# Patient Record
Sex: Female | Born: 1950 | Race: White | Hispanic: No | Marital: Married | Smoking: Current every day smoker
Health system: Southern US, Community
[De-identification: ages and names within clinical notes are randomized; demographics above are authoritative.]

## PROBLEM LIST (undated history)

## (undated) DIAGNOSIS — J45909 Unspecified asthma, uncomplicated: Secondary | ICD-10-CM

## (undated) DIAGNOSIS — K580 Irritable bowel syndrome with diarrhea: Secondary | ICD-10-CM

## (undated) DIAGNOSIS — M51369 Other intervertebral disc degeneration, lumbar region without mention of lumbar back pain or lower extremity pain: Secondary | ICD-10-CM

## (undated) DIAGNOSIS — K589 Irritable bowel syndrome without diarrhea: Secondary | ICD-10-CM

## (undated) DIAGNOSIS — M1990 Unspecified osteoarthritis, unspecified site: Secondary | ICD-10-CM

## (undated) DIAGNOSIS — Z85828 Personal history of other malignant neoplasm of skin: Secondary | ICD-10-CM

## (undated) DIAGNOSIS — F419 Anxiety disorder, unspecified: Secondary | ICD-10-CM

## (undated) DIAGNOSIS — Z720 Tobacco use: Secondary | ICD-10-CM

## (undated) DIAGNOSIS — N393 Stress incontinence (female) (male): Secondary | ICD-10-CM

## (undated) DIAGNOSIS — G629 Polyneuropathy, unspecified: Secondary | ICD-10-CM

## (undated) DIAGNOSIS — E782 Mixed hyperlipidemia: Secondary | ICD-10-CM

## (undated) DIAGNOSIS — G43019 Migraine without aura, intractable, without status migrainosus: Secondary | ICD-10-CM

## (undated) DIAGNOSIS — G9050 Complex regional pain syndrome I, unspecified: Secondary | ICD-10-CM

## (undated) DIAGNOSIS — M47812 Spondylosis without myelopathy or radiculopathy, cervical region: Secondary | ICD-10-CM

## (undated) DIAGNOSIS — R112 Nausea with vomiting, unspecified: Secondary | ICD-10-CM

## (undated) DIAGNOSIS — R42 Dizziness and giddiness: Secondary | ICD-10-CM

## (undated) DIAGNOSIS — N329 Bladder disorder, unspecified: Secondary | ICD-10-CM

## (undated) DIAGNOSIS — Z9889 Other specified postprocedural states: Secondary | ICD-10-CM

## (undated) DIAGNOSIS — E785 Hyperlipidemia, unspecified: Secondary | ICD-10-CM

## (undated) DIAGNOSIS — Z973 Presence of spectacles and contact lenses: Secondary | ICD-10-CM

## (undated) DIAGNOSIS — I10 Essential (primary) hypertension: Secondary | ICD-10-CM

## (undated) DIAGNOSIS — I700 Atherosclerosis of aorta: Secondary | ICD-10-CM

## (undated) DIAGNOSIS — M5136 Other intervertebral disc degeneration, lumbar region: Secondary | ICD-10-CM

## (undated) DIAGNOSIS — K635 Polyp of colon: Secondary | ICD-10-CM

## (undated) DIAGNOSIS — K219 Gastro-esophageal reflux disease without esophagitis: Secondary | ICD-10-CM

## (undated) DIAGNOSIS — R7303 Prediabetes: Secondary | ICD-10-CM

## (undated) DIAGNOSIS — G43909 Migraine, unspecified, not intractable, without status migrainosus: Secondary | ICD-10-CM

## (undated) DIAGNOSIS — F411 Generalized anxiety disorder: Secondary | ICD-10-CM

## (undated) DIAGNOSIS — Z8709 Personal history of other diseases of the respiratory system: Secondary | ICD-10-CM

## (undated) DIAGNOSIS — E041 Nontoxic single thyroid nodule: Secondary | ICD-10-CM

## (undated) DIAGNOSIS — I2510 Atherosclerotic heart disease of native coronary artery without angina pectoris: Secondary | ICD-10-CM

## (undated) HISTORY — PX: BACK SURGERY: SHX140

## (undated) HISTORY — DX: Atherosclerosis of aorta: I70.0

## (undated) HISTORY — DX: Unspecified asthma, uncomplicated: J45.909

## (undated) HISTORY — PX: COLONOSCOPY W/ POLYPECTOMY: SHX1380

## (undated) HISTORY — PX: CATARACT EXTRACTION: SUR2

## (undated) HISTORY — DX: Dizziness and giddiness: R42

## (undated) HISTORY — PX: POSTERIOR LUMBAR FUSION: SHX6036

## (undated) HISTORY — PX: TONSILLECTOMY AND ADENOIDECTOMY: SUR1326

## (undated) HISTORY — DX: Migraine without aura, intractable, without status migrainosus: G43.019

## (undated) HISTORY — DX: Polyp of colon: K63.5

## (undated) HISTORY — DX: Stress incontinence (female) (male): N39.3

## (undated) HISTORY — DX: Complex regional pain syndrome I, unspecified: G90.50

## (undated) HISTORY — DX: Anxiety disorder, unspecified: F41.9

---

## 1986-12-20 HISTORY — PX: SUPRACERVICAL ABDOMINAL HYSTERECTOMY: SHX5393

## 1986-12-20 HISTORY — PX: PARTIAL HYSTERECTOMY: SHX80

## 2000-05-31 ENCOUNTER — Encounter: Payer: Self-pay | Admitting: Family Medicine

## 2000-05-31 ENCOUNTER — Ambulatory Visit (HOSPITAL_COMMUNITY): Admission: RE | Admit: 2000-05-31 | Discharge: 2000-05-31 | Payer: Self-pay | Admitting: Family Medicine

## 2001-01-23 ENCOUNTER — Encounter (INDEPENDENT_AMBULATORY_CARE_PROVIDER_SITE_OTHER): Payer: Self-pay

## 2001-01-23 ENCOUNTER — Other Ambulatory Visit: Admission: RE | Admit: 2001-01-23 | Discharge: 2001-01-23 | Payer: Self-pay | Admitting: Gastroenterology

## 2001-06-02 ENCOUNTER — Encounter: Payer: Self-pay | Admitting: Family Medicine

## 2001-06-02 ENCOUNTER — Ambulatory Visit (HOSPITAL_COMMUNITY): Admission: RE | Admit: 2001-06-02 | Discharge: 2001-06-02 | Payer: Self-pay | Admitting: Family Medicine

## 2001-06-27 ENCOUNTER — Encounter: Admission: RE | Admit: 2001-06-27 | Discharge: 2001-07-04 | Payer: Self-pay | Admitting: Internal Medicine

## 2002-05-09 ENCOUNTER — Encounter: Payer: Self-pay | Admitting: Family Medicine

## 2002-05-09 ENCOUNTER — Ambulatory Visit (HOSPITAL_COMMUNITY): Admission: RE | Admit: 2002-05-09 | Discharge: 2002-05-09 | Payer: Self-pay | Admitting: Family Medicine

## 2002-05-15 ENCOUNTER — Encounter: Admission: RE | Admit: 2002-05-15 | Discharge: 2002-05-15 | Payer: Self-pay | Admitting: Family Medicine

## 2002-05-15 ENCOUNTER — Encounter: Payer: Self-pay | Admitting: Family Medicine

## 2003-07-30 ENCOUNTER — Encounter: Payer: Self-pay | Admitting: Family Medicine

## 2003-07-30 ENCOUNTER — Ambulatory Visit (HOSPITAL_COMMUNITY): Admission: RE | Admit: 2003-07-30 | Discharge: 2003-07-30 | Payer: Self-pay | Admitting: Family Medicine

## 2003-12-21 HISTORY — PX: CATARACT EXTRACTION W/ INTRAOCULAR LENS  IMPLANT, BILATERAL: SHX1307

## 2004-09-22 ENCOUNTER — Ambulatory Visit (HOSPITAL_COMMUNITY): Admission: RE | Admit: 2004-09-22 | Discharge: 2004-09-22 | Payer: Self-pay | Admitting: Family Medicine

## 2005-11-16 ENCOUNTER — Ambulatory Visit (HOSPITAL_COMMUNITY): Admission: RE | Admit: 2005-11-16 | Discharge: 2005-11-16 | Payer: Self-pay | Admitting: Family Medicine

## 2006-12-02 ENCOUNTER — Ambulatory Visit (HOSPITAL_COMMUNITY): Admission: RE | Admit: 2006-12-02 | Discharge: 2006-12-02 | Payer: Self-pay | Admitting: Family Medicine

## 2007-05-25 ENCOUNTER — Ambulatory Visit (HOSPITAL_COMMUNITY): Admission: RE | Admit: 2007-05-25 | Discharge: 2007-05-25 | Payer: Self-pay | Admitting: Family Medicine

## 2007-12-04 ENCOUNTER — Ambulatory Visit (HOSPITAL_COMMUNITY): Admission: RE | Admit: 2007-12-04 | Discharge: 2007-12-04 | Payer: Self-pay | Admitting: Family Medicine

## 2008-12-19 ENCOUNTER — Ambulatory Visit (HOSPITAL_COMMUNITY): Admission: RE | Admit: 2008-12-19 | Discharge: 2008-12-19 | Payer: Self-pay | Admitting: Family Medicine

## 2009-12-20 HISTORY — PX: FOOT SURGERY: SHX648

## 2010-01-15 ENCOUNTER — Ambulatory Visit (HOSPITAL_COMMUNITY): Admission: RE | Admit: 2010-01-15 | Discharge: 2010-01-15 | Payer: Self-pay | Admitting: Family Medicine

## 2011-01-10 ENCOUNTER — Encounter: Payer: Self-pay | Admitting: Family Medicine

## 2011-03-02 ENCOUNTER — Other Ambulatory Visit (HOSPITAL_COMMUNITY): Payer: Self-pay | Admitting: Family Medicine

## 2011-03-02 DIAGNOSIS — Z1231 Encounter for screening mammogram for malignant neoplasm of breast: Secondary | ICD-10-CM

## 2011-03-18 ENCOUNTER — Ambulatory Visit (HOSPITAL_COMMUNITY): Payer: 59

## 2011-03-26 ENCOUNTER — Ambulatory Visit (HOSPITAL_COMMUNITY)
Admission: RE | Admit: 2011-03-26 | Discharge: 2011-03-26 | Disposition: A | Discharge status: Home / Self Care | Payer: 59 | Source: Ambulatory Visit | Attending: Family Medicine | Admitting: Family Medicine

## 2011-03-26 DIAGNOSIS — Z1231 Encounter for screening mammogram for malignant neoplasm of breast: Secondary | ICD-10-CM | POA: Insufficient documentation

## 2012-02-03 ENCOUNTER — Ambulatory Visit (INDEPENDENT_AMBULATORY_CARE_PROVIDER_SITE_OTHER): Payer: 59 | Admitting: Physician Assistant

## 2012-02-03 VITALS — BP 142/79 | HR 78 | Temp 97.7°F | Resp 16 | Ht 59.0 in | Wt 130.0 lb

## 2012-02-03 DIAGNOSIS — H6590 Unspecified nonsuppurative otitis media, unspecified ear: Secondary | ICD-10-CM

## 2012-02-03 DIAGNOSIS — H6980 Other specified disorders of Eustachian tube, unspecified ear: Secondary | ICD-10-CM

## 2012-02-03 DIAGNOSIS — H6593 Unspecified nonsuppurative otitis media, bilateral: Secondary | ICD-10-CM

## 2012-02-03 DIAGNOSIS — H6990 Unspecified Eustachian tube disorder, unspecified ear: Secondary | ICD-10-CM

## 2012-02-03 DIAGNOSIS — J019 Acute sinusitis, unspecified: Secondary | ICD-10-CM

## 2012-02-03 MED ORDER — CLARITHROMYCIN ER 500 MG PO TB24: 1000.0000 mg | ORAL_TABLET | Freq: Every day | ORAL | Status: AC

## 2012-02-03 MED ORDER — IPRATROPIUM BROMIDE 0.03 % NA SOLN: 2.0000 | Freq: Two times a day (BID) | NASAL | Status: DC

## 2012-02-03 MED ORDER — GUAIFENESIN ER 1200 MG PO TB12: 1.0000 | ORAL_TABLET | Freq: Two times a day (BID) | ORAL | Status: DC | PRN

## 2012-02-03 NOTE — Patient Instructions (Signed)
Get as much rest as possible and drink plenty of fluids.

## 2012-02-03 NOTE — Progress Notes (Signed)
  Subjective:    Patient ID: Nichole Delgado, female    DOB: 02-09-1951, 61 y.o.   MRN: [id number]  HPI Patient presents with 2 weeks of sinus pressure and congestion and ear pressure.  The ear symptoms are worsening and are her primary concern today.  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  No sore throat.  Mild cough.  No GI/GU symptoms. No F/C.  No myalgias. Review of Systems As above.    Objective:   Physical Exam  Vitals reviewed. Constitutional: She is oriented to person, place, and time. She appears well-developed and well-nourished. No distress.  HENT:  Head: Normocephalic and atraumatic.  Right Ear: Hearing, external ear and ear canal normal. A middle ear effusion is present.  Left Ear: Hearing, external ear and ear canal normal. A middle ear effusion is present.  Ears:  Nose: Mucosal edema and rhinorrhea present.  No foreign bodies. Right sinus exhibits no maxillary sinus tenderness and no frontal sinus tenderness. Left sinus exhibits no maxillary sinus tenderness and no frontal sinus tenderness.  Mouth/Throat: Uvula is midline, oropharynx is clear and moist and mucous membranes are normal. No uvula swelling. No oropharyngeal exudate.  Eyes: Conjunctivae and EOM are normal. Pupils are equal, round, and reactive to light. Right eye exhibits no discharge. Left eye exhibits no discharge. No scleral icterus.  Neck: Trachea normal, normal range of motion and full passive range of motion without pain. Neck supple. No mass and no thyromegaly present.  Cardiovascular: Normal rate, regular rhythm and normal heart sounds.   Pulmonary/Chest: Effort normal and breath sounds normal.  Lymphadenopathy:       Head (right side): No submandibular, no tonsillar, no preauricular, no posterior auricular and no occipital adenopathy present.       Head (left side): No  submandibular, no tonsillar, no preauricular and no occipital adenopathy present.    She has no cervical adenopathy.       Right: No supraclavicular adenopathy present.       Left: No supraclavicular adenopathy present.  Neurological: She is alert and oriented to person, place, and time. She has normal strength. No cranial nerve deficit or sensory deficit.  Skin: Skin is warm, dry and intact. No rash noted.  Psychiatric: She has a normal mood and affect. Her speech is normal and behavior is normal.          Assessment & Plan:  Eustachian tube dysfunction. Bilateral otitis media with effusion. Sinusitis.  Biaxin XL 500 mg 2 PO QD x 10. Atrovent NS 0.03% 2 prays Qnos BID Mucinex Max Strength BID

## 2012-03-29 ENCOUNTER — Other Ambulatory Visit (HOSPITAL_COMMUNITY): Payer: Self-pay | Admitting: Family Medicine

## 2012-03-29 DIAGNOSIS — Z1231 Encounter for screening mammogram for malignant neoplasm of breast: Secondary | ICD-10-CM

## 2012-04-25 ENCOUNTER — Ambulatory Visit (HOSPITAL_COMMUNITY)
Admission: RE | Admit: 2012-04-25 | Discharge: 2012-04-25 | Disposition: A | Discharge status: Home / Self Care | Payer: 59 | Source: Ambulatory Visit | Attending: Family Medicine | Admitting: Family Medicine

## 2012-04-25 DIAGNOSIS — Z1231 Encounter for screening mammogram for malignant neoplasm of breast: Secondary | ICD-10-CM | POA: Insufficient documentation

## 2013-03-28 ENCOUNTER — Other Ambulatory Visit (HOSPITAL_COMMUNITY): Payer: Self-pay | Admitting: Family Medicine

## 2013-03-28 DIAGNOSIS — Z1231 Encounter for screening mammogram for malignant neoplasm of breast: Secondary | ICD-10-CM

## 2013-04-26 ENCOUNTER — Ambulatory Visit (HOSPITAL_COMMUNITY)
Admission: RE | Admit: 2013-04-26 | Discharge: 2013-04-26 | Disposition: A | Discharge status: Home / Self Care | Payer: BC Managed Care – PPO | Source: Ambulatory Visit | Attending: Family Medicine | Admitting: Family Medicine

## 2013-04-26 DIAGNOSIS — Z1231 Encounter for screening mammogram for malignant neoplasm of breast: Secondary | ICD-10-CM | POA: Insufficient documentation

## 2014-06-24 ENCOUNTER — Other Ambulatory Visit (HOSPITAL_COMMUNITY): Payer: Self-pay | Admitting: Family Medicine

## 2014-06-24 DIAGNOSIS — Z1231 Encounter for screening mammogram for malignant neoplasm of breast: Secondary | ICD-10-CM

## 2014-07-10 ENCOUNTER — Ambulatory Visit (HOSPITAL_COMMUNITY)
Admission: RE | Admit: 2014-07-10 | Discharge: 2014-07-10 | Disposition: A | Discharge status: Home / Self Care | Payer: Medicare Other | Source: Ambulatory Visit | Attending: Family Medicine | Admitting: Family Medicine

## 2014-07-10 DIAGNOSIS — Z1231 Encounter for screening mammogram for malignant neoplasm of breast: Secondary | ICD-10-CM | POA: Diagnosis not present

## 2014-09-26 ENCOUNTER — Other Ambulatory Visit (HOSPITAL_COMMUNITY): Payer: Self-pay | Admitting: Family Medicine

## 2014-09-26 DIAGNOSIS — M810 Age-related osteoporosis without current pathological fracture: Secondary | ICD-10-CM

## 2014-10-02 ENCOUNTER — Ambulatory Visit (HOSPITAL_COMMUNITY): Payer: BC Managed Care – PPO

## 2014-10-03 ENCOUNTER — Ambulatory Visit (HOSPITAL_COMMUNITY)
Admission: RE | Admit: 2014-10-03 | Discharge: 2014-10-03 | Disposition: A | Discharge status: Home / Self Care | Payer: Medicare Other | Source: Ambulatory Visit | Attending: Family Medicine | Admitting: Family Medicine

## 2014-10-03 DIAGNOSIS — Z780 Asymptomatic menopausal state: Secondary | ICD-10-CM | POA: Diagnosis not present

## 2014-10-03 DIAGNOSIS — Z13820 Encounter for screening for osteoporosis: Secondary | ICD-10-CM | POA: Insufficient documentation

## 2014-10-03 DIAGNOSIS — M810 Age-related osteoporosis without current pathological fracture: Secondary | ICD-10-CM

## 2014-12-23 DIAGNOSIS — L820 Inflamed seborrheic keratosis: Secondary | ICD-10-CM | POA: Diagnosis not present

## 2014-12-24 ENCOUNTER — Other Ambulatory Visit (HOSPITAL_COMMUNITY): Payer: Self-pay | Admitting: Psychiatry

## 2015-02-06 DIAGNOSIS — R05 Cough: Secondary | ICD-10-CM | POA: Diagnosis not present

## 2015-02-06 DIAGNOSIS — F17200 Nicotine dependence, unspecified, uncomplicated: Secondary | ICD-10-CM | POA: Diagnosis not present

## 2015-02-18 ENCOUNTER — Institutional Professional Consult (permissible substitution): Payer: Medicare Other | Admitting: Internal Medicine

## 2015-03-06 NOTE — Telephone Encounter (Signed)
Refill of Navane declined as patient is not open to this facility.  Informed pharmacy through e-scribe message.

## 2015-03-12 DIAGNOSIS — M25562 Pain in left knee: Secondary | ICD-10-CM | POA: Diagnosis not present

## 2015-03-12 DIAGNOSIS — M1712 Unilateral primary osteoarthritis, left knee: Secondary | ICD-10-CM | POA: Diagnosis not present

## 2015-03-12 DIAGNOSIS — M25561 Pain in right knee: Secondary | ICD-10-CM | POA: Diagnosis not present

## 2015-03-31 DIAGNOSIS — H04123 Dry eye syndrome of bilateral lacrimal glands: Secondary | ICD-10-CM | POA: Diagnosis not present

## 2015-03-31 DIAGNOSIS — H16103 Unspecified superficial keratitis, bilateral: Secondary | ICD-10-CM | POA: Diagnosis not present

## 2015-03-31 DIAGNOSIS — Z961 Presence of intraocular lens: Secondary | ICD-10-CM | POA: Diagnosis not present

## 2015-03-31 DIAGNOSIS — H52203 Unspecified astigmatism, bilateral: Secondary | ICD-10-CM | POA: Diagnosis not present

## 2015-04-15 DIAGNOSIS — R110 Nausea: Secondary | ICD-10-CM | POA: Diagnosis not present

## 2015-04-15 DIAGNOSIS — K589 Irritable bowel syndrome without diarrhea: Secondary | ICD-10-CM | POA: Diagnosis not present

## 2015-06-06 DIAGNOSIS — R109 Unspecified abdominal pain: Secondary | ICD-10-CM | POA: Diagnosis not present

## 2015-06-06 DIAGNOSIS — R110 Nausea: Secondary | ICD-10-CM | POA: Diagnosis not present

## 2015-06-06 DIAGNOSIS — K589 Irritable bowel syndrome without diarrhea: Secondary | ICD-10-CM | POA: Diagnosis not present

## 2015-06-12 DIAGNOSIS — F250 Schizoaffective disorder, bipolar type: Secondary | ICD-10-CM | POA: Diagnosis not present

## 2015-06-17 DIAGNOSIS — F17200 Nicotine dependence, unspecified, uncomplicated: Secondary | ICD-10-CM | POA: Diagnosis not present

## 2015-06-17 DIAGNOSIS — R0789 Other chest pain: Secondary | ICD-10-CM | POA: Diagnosis not present

## 2015-06-17 DIAGNOSIS — J984 Other disorders of lung: Secondary | ICD-10-CM | POA: Diagnosis not present

## 2015-06-30 ENCOUNTER — Other Ambulatory Visit (HOSPITAL_COMMUNITY): Payer: Self-pay | Admitting: Family Medicine

## 2015-06-30 DIAGNOSIS — Z1231 Encounter for screening mammogram for malignant neoplasm of breast: Secondary | ICD-10-CM

## 2015-07-07 DIAGNOSIS — M19041 Primary osteoarthritis, right hand: Secondary | ICD-10-CM | POA: Diagnosis not present

## 2015-07-21 ENCOUNTER — Ambulatory Visit (HOSPITAL_COMMUNITY)
Admission: RE | Admit: 2015-07-21 | Discharge: 2015-07-21 | Disposition: A | Discharge status: Home / Self Care | Payer: Medicare Other | Source: Ambulatory Visit | Attending: Family Medicine | Admitting: Family Medicine

## 2015-07-21 DIAGNOSIS — Z1231 Encounter for screening mammogram for malignant neoplasm of breast: Secondary | ICD-10-CM | POA: Diagnosis not present

## 2015-09-11 DIAGNOSIS — M1712 Unilateral primary osteoarthritis, left knee: Secondary | ICD-10-CM | POA: Diagnosis not present

## 2015-09-16 DIAGNOSIS — E785 Hyperlipidemia, unspecified: Secondary | ICD-10-CM | POA: Diagnosis not present

## 2015-09-24 DIAGNOSIS — Z23 Encounter for immunization: Secondary | ICD-10-CM | POA: Diagnosis not present

## 2015-09-24 DIAGNOSIS — F17200 Nicotine dependence, unspecified, uncomplicated: Secondary | ICD-10-CM | POA: Diagnosis not present

## 2015-09-24 DIAGNOSIS — E785 Hyperlipidemia, unspecified: Secondary | ICD-10-CM | POA: Diagnosis not present

## 2015-09-24 DIAGNOSIS — N951 Menopausal and female climacteric states: Secondary | ICD-10-CM | POA: Diagnosis not present

## 2015-09-24 DIAGNOSIS — F5102 Adjustment insomnia: Secondary | ICD-10-CM | POA: Diagnosis not present

## 2015-10-15 DIAGNOSIS — L820 Inflamed seborrheic keratosis: Secondary | ICD-10-CM | POA: Diagnosis not present

## 2015-10-31 DIAGNOSIS — M25561 Pain in right knee: Secondary | ICD-10-CM | POA: Diagnosis not present

## 2015-12-22 DIAGNOSIS — F250 Schizoaffective disorder, bipolar type: Secondary | ICD-10-CM | POA: Diagnosis not present

## 2015-12-25 DIAGNOSIS — M50323 Other cervical disc degeneration at C6-C7 level: Secondary | ICD-10-CM | POA: Diagnosis not present

## 2015-12-25 DIAGNOSIS — M542 Cervicalgia: Secondary | ICD-10-CM | POA: Diagnosis not present

## 2016-01-08 DIAGNOSIS — M1712 Unilateral primary osteoarthritis, left knee: Secondary | ICD-10-CM | POA: Diagnosis not present

## 2016-01-13 DIAGNOSIS — M50323 Other cervical disc degeneration at C6-C7 level: Secondary | ICD-10-CM | POA: Diagnosis not present

## 2016-01-30 DIAGNOSIS — S83242D Other tear of medial meniscus, current injury, left knee, subsequent encounter: Secondary | ICD-10-CM | POA: Diagnosis not present

## 2016-01-30 DIAGNOSIS — M1712 Unilateral primary osteoarthritis, left knee: Secondary | ICD-10-CM | POA: Diagnosis not present

## 2016-02-02 DIAGNOSIS — J019 Acute sinusitis, unspecified: Secondary | ICD-10-CM | POA: Diagnosis not present

## 2016-02-24 DIAGNOSIS — N951 Menopausal and female climacteric states: Secondary | ICD-10-CM | POA: Insufficient documentation

## 2016-02-24 DIAGNOSIS — G4700 Insomnia, unspecified: Secondary | ICD-10-CM | POA: Insufficient documentation

## 2016-02-24 DIAGNOSIS — F5102 Adjustment insomnia: Secondary | ICD-10-CM | POA: Insufficient documentation

## 2016-02-24 DIAGNOSIS — M1990 Unspecified osteoarthritis, unspecified site: Secondary | ICD-10-CM | POA: Insufficient documentation

## 2016-02-24 DIAGNOSIS — M810 Age-related osteoporosis without current pathological fracture: Secondary | ICD-10-CM | POA: Insufficient documentation

## 2016-02-25 DIAGNOSIS — H6502 Acute serous otitis media, left ear: Secondary | ICD-10-CM | POA: Diagnosis not present

## 2016-02-25 DIAGNOSIS — R42 Dizziness and giddiness: Secondary | ICD-10-CM | POA: Diagnosis not present

## 2016-05-04 DIAGNOSIS — H6983 Other specified disorders of Eustachian tube, bilateral: Secondary | ICD-10-CM | POA: Diagnosis not present

## 2016-05-04 DIAGNOSIS — H6993 Unspecified Eustachian tube disorder, bilateral: Secondary | ICD-10-CM | POA: Insufficient documentation

## 2016-05-04 DIAGNOSIS — R42 Dizziness and giddiness: Secondary | ICD-10-CM | POA: Diagnosis not present

## 2016-05-04 DIAGNOSIS — J309 Allergic rhinitis, unspecified: Secondary | ICD-10-CM | POA: Diagnosis not present

## 2016-05-25 DIAGNOSIS — N951 Menopausal and female climacteric states: Secondary | ICD-10-CM | POA: Diagnosis not present

## 2016-05-26 DIAGNOSIS — M50323 Other cervical disc degeneration at C6-C7 level: Secondary | ICD-10-CM | POA: Diagnosis not present

## 2016-05-28 DIAGNOSIS — E538 Deficiency of other specified B group vitamins: Secondary | ICD-10-CM | POA: Diagnosis not present

## 2016-05-28 DIAGNOSIS — N958 Other specified menopausal and perimenopausal disorders: Secondary | ICD-10-CM | POA: Diagnosis not present

## 2016-06-25 DIAGNOSIS — N958 Other specified menopausal and perimenopausal disorders: Secondary | ICD-10-CM | POA: Diagnosis not present

## 2016-06-28 DIAGNOSIS — R42 Dizziness and giddiness: Secondary | ICD-10-CM | POA: Diagnosis not present

## 2016-06-28 DIAGNOSIS — F250 Schizoaffective disorder, bipolar type: Secondary | ICD-10-CM | POA: Diagnosis not present

## 2016-06-30 DIAGNOSIS — N958 Other specified menopausal and perimenopausal disorders: Secondary | ICD-10-CM | POA: Diagnosis not present

## 2016-07-05 DIAGNOSIS — H16103 Unspecified superficial keratitis, bilateral: Secondary | ICD-10-CM | POA: Diagnosis not present

## 2016-07-05 DIAGNOSIS — H04123 Dry eye syndrome of bilateral lacrimal glands: Secondary | ICD-10-CM | POA: Diagnosis not present

## 2016-07-05 DIAGNOSIS — H52203 Unspecified astigmatism, bilateral: Secondary | ICD-10-CM | POA: Diagnosis not present

## 2016-07-05 DIAGNOSIS — Z961 Presence of intraocular lens: Secondary | ICD-10-CM | POA: Diagnosis not present

## 2016-07-07 ENCOUNTER — Encounter: Payer: Self-pay | Admitting: Neurology

## 2016-07-07 ENCOUNTER — Ambulatory Visit (INDEPENDENT_AMBULATORY_CARE_PROVIDER_SITE_OTHER): Payer: Medicare Other | Admitting: Neurology

## 2016-07-07 VITALS — BP 132/81 | HR 85 | Ht 59.0 in | Wt 131.2 lb

## 2016-07-07 DIAGNOSIS — R42 Dizziness and giddiness: Secondary | ICD-10-CM

## 2016-07-07 DIAGNOSIS — Z5181 Encounter for therapeutic drug level monitoring: Secondary | ICD-10-CM

## 2016-07-07 HISTORY — DX: Dizziness and giddiness: R42

## 2016-07-07 NOTE — Progress Notes (Signed)
Reason for visit: Vertigo  Referring physician: Dr. Alford Highland is a 65 y.o. female  History of present illness:  Ms. 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 The patient indicates that the episodes usually will occur on average once a year, but within the last 12 months, the episodes have occurred 6 or 7 times. The patient indicates that the episodes will come on spontaneously, at various times during the day. There is no particular activity that will bring on the episodes. She reports that no particular head position that initiates the vertigo, the patient has true vertigo with oscillopsia. The patient has to close her eyes, she has prominent nausea and vomiting. She is unable to perform any activities during the symptomatic period. The patient has to lie still and let the episode pass, which may take 6 hours or more. The patient denies any headache before, during, or after the event. She denies any personal history of migraine, but her mother and her daughter have migraine. The patient denies any numbness or weakness of the face, arms, or legs with the events. She denies any ear pain, hearing changes, or ringing in the ears with the episodes of vertigo. She has been seen by Dr. Janace Hoard from ENT. No significant abnormalities were noted. The patient does have some occasional neck pain, she but she does not clearly relate this to the episodes of vertigo. Increased stress has been present recently as the patient is moving. She denies any episodes of double vision or loss of vision. She has not undergone MRI evaluation of the brain. The patient is sent to this office for further evaluation. She has been given meclizine for the dizziness, but she is not sure that it is helpful as she may be throwing up the medication.  Past Medical History  Diagnosis Date  . Anxiety   . Vertigo 07/07/2016    Past Surgical History  Procedure  Laterality Date  . Partial hysterectomy  1988  . Foot surgery  2011  . Cataract extraction Bilateral     Family History  Problem Relation Age of Onset  . Heart attack Father   . Migraines Mother   . Cancer Sister     Pancreatic  . Cancer Brother     Liver  . Migraines Daughter     Social history:  reports that she has been smoking.  She does not have any smokeless tobacco history on file. She reports that she does not drink alcohol. Her drug history is not on file.  Medications:  Prior to Admission medications   Medication Sig Start Date End Date Taking? Authorizing Provider  esterified estrogens (MENEST) 0.625 MG tablet Take 0.625 mg by mouth once.   Yes Historical Provider, MD  etodolac (LODINE) 400 MG tablet Take 400 mg by mouth 2 (two) times daily.   Yes Historical Provider, MD  fluticasone Asencion Islam) 50 MCG/ACT nasal spray  02/25/16  Yes Historical Provider, MD  Guaifenesin (MUCINEX MAXIMUM STRENGTH) 1200 MG TB12 Take 1 tablet (1,200 mg total) by mouth every 12 (twelve) hours as needed. 02/03/12  Yes Chelle Jeffery, PA-C  meclizine (ANTIVERT) 25 MG tablet Take 25 mg by mouth. 02/25/16  Yes Historical Provider, MD  meloxicam (MOBIC) 7.5 MG tablet Take 7.5 mg by mouth.   Yes Historical Provider, MD  nortriptyline (PAMELOR) 25 MG capsule  05/31/16  Yes Historical Provider, MD  omeprazole (PRILOSEC) 40 MG capsule  05/19/16  Yes  Historical Provider, MD  pravastatin (PRAVACHOL) 40 MG tablet  06/15/16  Yes Historical Provider, MD  rizatriptan (MAXALT-MLT) 10 MG disintegrating tablet  06/28/16  Yes Historical Provider, MD  thiothixene (NAVANE) 2 MG capsule Take by mouth.   Yes Historical Provider, MD  zolpidem (AMBIEN) 10 MG tablet Take 10 mg by mouth.   Yes Historical Provider, MD  ipratropium (ATROVENT) 0.03 % nasal spray Place 2 sprays into the nose 2 (two) times daily. 02/03/12 02/02/13  Harrison Mons, PA-C      Allergies  Allergen Reactions  . Codeine Nausea Only  . Penicillins   .  Sulfa Antibiotics     ROS:  Out of a complete 14 system review of symptoms, the patient complains only of the following symptoms, and all other reviewed systems are negative.  Vertigo Diarrhea Feeling hot Joint pain, joint swelling, muscle cramps, aching muscles Allergies, runny nose Anxiety  Blood pressure 132/81, pulse 85, height 4\' 11"  (1.499 m), weight 131 lb 4 oz (59.535 kg).  Physical Exam  General: The patient is alert and cooperative at the time of the examination. The patient is minimally obese.  Eyes: Pupils are equal, round, and reactive to light. Discs are flat bilaterally.  Ears: Tympanic membranes are clear bilaterally.  Neck: The neck is supple, no carotid bruits are noted.  Respiratory: The respiratory examination is clear.  Cardiovascular: The cardiovascular examination reveals a regular rate and rhythm, no obvious murmurs or rubs are noted.  Skin: Extremities are without significant edema.  Neurologic Exam  Mental status: The patient is alert and oriented x 3 at the time of the examination. The patient has apparent normal recent and remote memory, with an apparently normal attention span and concentration ability.  Cranial nerves: Facial symmetry is present. There is good sensation of the face to pinprick and soft touch bilaterally. The strength of the facial muscles and the muscles to head turning and shoulder shrug are normal bilaterally. Speech is well enunciated, no aphasia or dysarthria is noted. Extraocular movements are full. Visual fields are full. The tongue is midline, and the patient has symmetric elevation of the soft palate. No obvious hearing deficits are noted.  Motor: The motor testing reveals 5 over 5 strength of all 4 extremities. Good symmetric motor tone is noted throughout.  Sensory: Sensory testing is intact to pinprick, soft touch, vibration sensation, and position sense on all 4 extremities. No evidence of extinction is  noted.  Coordination: Cerebellar testing reveals good finger-nose-finger and heel-to-shin bilaterally. The Nyan-Barrany procedure was performed, this was unremarkable.  Gait and station: Gait is normal. Tandem gait is normal. Romberg is negative. No drift is seen.  Reflexes: Deep tendon reflexes are symmetric and normal bilaterally. Toes are downgoing bilaterally.   Assessment/Plan:  1. Episodic vertigo  The etiology of the episodic vertigo is not clear. The patient has no hearing changes, tinnitus, or positional nature to her vertigo. The patient has a prominent family history of migraine, and certainly this does need to be considered as a possible etiology for her symptoms. The patient may be having migraine equivalent. The patient will be set up for MRI of the brain, we may consider using Topamax in the future. She will follow-up in 4 months.  Jill Alexanders MD 07/07/2016 8:24 PM  [facility] [address] [place], [place] [address]  Phone [phone] Fax [phone]

## 2016-07-07 NOTE — Patient Instructions (Signed)

## 2016-07-08 ENCOUNTER — Telehealth: Payer: Self-pay | Admitting: Neurology

## 2016-07-08 LAB — COMPREHENSIVE METABOLIC PANEL
ALBUMIN: 4.5 g/dL (ref 3.6–4.8)
ALK PHOS: 68 IU/L (ref 39–117)
ALT: 20 IU/L (ref 0–32)
AST: 16 IU/L (ref 0–40)
Albumin/Globulin Ratio: 2.3 — ABNORMAL HIGH (ref 1.2–2.2)
BUN / CREAT RATIO: 15 (ref 12–28)
BUN: 17 mg/dL (ref 8–27)
Bilirubin Total: 0.3 mg/dL (ref 0.0–1.2)
CO2: 22 mmol/L (ref 18–29)
CREATININE: 1.1 mg/dL — AB (ref 0.57–1.00)
Calcium: 9.9 mg/dL (ref 8.7–10.3)
Chloride: 98 mmol/L (ref 96–106)
GFR, EST AFRICAN AMERICAN: 60 mL/min/{1.73_m2} (ref >59)
GFR, EST NON AFRICAN AMERICAN: 52 mL/min/{1.73_m2} — AB (ref >59)
GLOBULIN, TOTAL: 2 g/dL (ref 1.5–4.5)
Glucose: 95 mg/dL (ref 65–99)
Potassium: 4.5 mmol/L (ref 3.5–5.2)
SODIUM: 140 mmol/L (ref 134–144)
TOTAL PROTEIN: 6.5 g/dL (ref 6.0–8.5)

## 2016-07-08 NOTE — Telephone Encounter (Signed)
I called the patient. The blood work is unremarkable, minimal impairment of renal function, should not impair the ability to get gadolinium enhancement.

## 2016-07-09 NOTE — Telephone Encounter (Addendum)
Pt called to get lab results. May call cell [phone], but would rather have message left on home phone- [phone]

## 2016-07-09 NOTE — Telephone Encounter (Signed)
Called pt w/ unremarkable lab results. Reviewed mildly elevated creatinine. Encouraged PO fluids prior to scan. Verbalized understanding and appreciation for call.

## 2016-07-14 ENCOUNTER — Telehealth: Payer: Self-pay | Admitting: Neurology

## 2016-07-14 NOTE — Telephone Encounter (Signed)
Called patient and informed her that her MRI had been sent to [facility].

## 2016-07-14 NOTE — Telephone Encounter (Signed)
Patient is calling to see if her MRI had been scheduled.

## 2016-07-16 ENCOUNTER — Other Ambulatory Visit: Payer: Self-pay | Admitting: Family Medicine

## 2016-07-16 DIAGNOSIS — Z1231 Encounter for screening mammogram for malignant neoplasm of breast: Secondary | ICD-10-CM

## 2016-07-20 DIAGNOSIS — M2550 Pain in unspecified joint: Secondary | ICD-10-CM | POA: Diagnosis not present

## 2016-07-21 DIAGNOSIS — M5030 Other cervical disc degeneration, unspecified cervical region: Secondary | ICD-10-CM | POA: Diagnosis not present

## 2016-07-21 DIAGNOSIS — M170 Bilateral primary osteoarthritis of knee: Secondary | ICD-10-CM | POA: Diagnosis not present

## 2016-07-25 DIAGNOSIS — R42 Dizziness and giddiness: Secondary | ICD-10-CM | POA: Diagnosis not present

## 2016-07-25 DIAGNOSIS — I10 Essential (primary) hypertension: Secondary | ICD-10-CM | POA: Diagnosis not present

## 2016-07-26 ENCOUNTER — Ambulatory Visit
Admission: RE | Admit: 2016-07-26 | Discharge: 2016-07-26 | Disposition: A | Discharge status: Home / Self Care | Payer: Medicare Other | Source: Ambulatory Visit | Attending: Neurology | Admitting: Neurology

## 2016-07-26 ENCOUNTER — Telehealth: Payer: Self-pay | Admitting: Neurology

## 2016-07-26 DIAGNOSIS — R42 Dizziness and giddiness: Secondary | ICD-10-CM

## 2016-07-26 MED ORDER — ALPRAZOLAM 0.5 MG PO TABS: ORAL_TABLET | ORAL | 0 refills | Status: DC

## 2016-07-26 NOTE — Telephone Encounter (Signed)
I will send in a prescription for alprazolam.

## 2016-07-26 NOTE — Telephone Encounter (Addendum)
Patient called to advise, she was unable to do MRI today, claustrophobic, MRI has been rescheduled to August 15th for open MRI, was advised to call Dr. Jannifer Franklin to ask for Rx for Xanax to [facility] on [address].

## 2016-07-27 NOTE — Telephone Encounter (Signed)
Rx printed, signed, faxed to pharmacy. Pt notified via TC.

## 2016-07-28 ENCOUNTER — Ambulatory Visit
Admission: RE | Admit: 2016-07-28 | Discharge: 2016-07-28 | Disposition: A | Discharge status: Home / Self Care | Payer: Medicare Other | Source: Ambulatory Visit | Attending: Family Medicine | Admitting: Family Medicine

## 2016-07-28 DIAGNOSIS — Z1231 Encounter for screening mammogram for malignant neoplasm of breast: Secondary | ICD-10-CM | POA: Diagnosis not present

## 2016-08-03 ENCOUNTER — Ambulatory Visit
Admission: RE | Admit: 2016-08-03 | Discharge: 2016-08-03 | Disposition: A | Discharge status: Home / Self Care | Payer: Medicare Other | Source: Ambulatory Visit | Attending: Neurology | Admitting: Neurology

## 2016-08-03 DIAGNOSIS — R42 Dizziness and giddiness: Secondary | ICD-10-CM | POA: Diagnosis not present

## 2016-08-03 MED ORDER — GADOBENATE DIMEGLUMINE 529 MG/ML IV SOLN
12.0000 mL | Freq: Once | INTRAVENOUS | Status: AC | PRN
  Administered 2016-08-03: 12 mL via INTRAVENOUS

## 2016-08-04 ENCOUNTER — Telehealth: Payer: Self-pay | Admitting: Neurology

## 2016-08-04 NOTE — Telephone Encounter (Signed)
I called the patient. 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 The patient has not had any dizzy episodes in 2 weeks. Imitrex helped the event, suggesting a migraine etiology. She is to call if she has recurrence, and we will try Topamax.

## 2016-08-09 ENCOUNTER — Telehealth: Payer: Self-pay | Admitting: Neurology

## 2016-08-09 MED ORDER — RIZATRIPTAN BENZOATE 10 MG PO TBDP: 10.0000 mg | ORAL_TABLET | Freq: Three times a day (TID) | ORAL | 11 refills | Status: DC | PRN

## 2016-08-09 MED ORDER — TOPIRAMATE 25 MG PO TABS: ORAL_TABLET | ORAL | 3 refills | Status: DC

## 2016-08-09 NOTE — Telephone Encounter (Signed)
Patient called, states had a migrained over the weekend, "it was a whopper", still has today, states Dr. Jannifer Franklin was going to prescribe a medication as a preventative, also requests Rx for [facility] also requests the kind that disolves on tongue. Will be going out shortly, may leave message.

## 2016-08-09 NOTE — Telephone Encounter (Signed)
I called patient. The patient had a three-day headache over the weekend, she is now getting over the headache. I'll call in a prescription for Maxalt, she was to try a low-dose prescription for Topamax. I have called in these medications.

## 2016-08-24 DIAGNOSIS — N958 Other specified menopausal and perimenopausal disorders: Secondary | ICD-10-CM | POA: Diagnosis not present

## 2016-08-27 DIAGNOSIS — N958 Other specified menopausal and perimenopausal disorders: Secondary | ICD-10-CM | POA: Diagnosis not present

## 2016-08-27 DIAGNOSIS — E538 Deficiency of other specified B group vitamins: Secondary | ICD-10-CM | POA: Diagnosis not present

## 2016-09-01 DIAGNOSIS — H10413 Chronic giant papillary conjunctivitis, bilateral: Secondary | ICD-10-CM | POA: Diagnosis not present

## 2016-09-01 DIAGNOSIS — H04123 Dry eye syndrome of bilateral lacrimal glands: Secondary | ICD-10-CM | POA: Diagnosis not present

## 2016-09-01 DIAGNOSIS — H5712 Ocular pain, left eye: Secondary | ICD-10-CM | POA: Diagnosis not present

## 2016-09-28 DIAGNOSIS — N958 Other specified menopausal and perimenopausal disorders: Secondary | ICD-10-CM | POA: Diagnosis not present

## 2016-10-05 DIAGNOSIS — N958 Other specified menopausal and perimenopausal disorders: Secondary | ICD-10-CM | POA: Diagnosis not present

## 2016-10-05 DIAGNOSIS — E538 Deficiency of other specified B group vitamins: Secondary | ICD-10-CM | POA: Diagnosis not present

## 2016-10-06 DIAGNOSIS — M19041 Primary osteoarthritis, right hand: Secondary | ICD-10-CM | POA: Diagnosis not present

## 2016-10-06 DIAGNOSIS — M79641 Pain in right hand: Secondary | ICD-10-CM | POA: Diagnosis not present

## 2016-10-06 DIAGNOSIS — M79642 Pain in left hand: Secondary | ICD-10-CM | POA: Diagnosis not present

## 2016-10-06 DIAGNOSIS — M19042 Primary osteoarthritis, left hand: Secondary | ICD-10-CM | POA: Diagnosis not present

## 2016-10-11 DIAGNOSIS — R42 Dizziness and giddiness: Secondary | ICD-10-CM | POA: Diagnosis not present

## 2016-10-11 DIAGNOSIS — Z8659 Personal history of other mental and behavioral disorders: Secondary | ICD-10-CM | POA: Diagnosis not present

## 2016-10-11 DIAGNOSIS — Z23 Encounter for immunization: Secondary | ICD-10-CM | POA: Diagnosis not present

## 2016-10-11 DIAGNOSIS — K589 Irritable bowel syndrome without diarrhea: Secondary | ICD-10-CM | POA: Diagnosis not present

## 2016-10-11 DIAGNOSIS — Z79899 Other long term (current) drug therapy: Secondary | ICD-10-CM | POA: Diagnosis not present

## 2016-10-11 DIAGNOSIS — M810 Age-related osteoporosis without current pathological fracture: Secondary | ICD-10-CM | POA: Diagnosis not present

## 2016-10-11 DIAGNOSIS — G43909 Migraine, unspecified, not intractable, without status migrainosus: Secondary | ICD-10-CM | POA: Diagnosis not present

## 2016-10-11 DIAGNOSIS — E7800 Pure hypercholesterolemia, unspecified: Secondary | ICD-10-CM | POA: Diagnosis not present

## 2016-10-11 DIAGNOSIS — G479 Sleep disorder, unspecified: Secondary | ICD-10-CM | POA: Diagnosis not present

## 2016-10-11 DIAGNOSIS — M1990 Unspecified osteoarthritis, unspecified site: Secondary | ICD-10-CM | POA: Diagnosis not present

## 2016-10-11 DIAGNOSIS — K219 Gastro-esophageal reflux disease without esophagitis: Secondary | ICD-10-CM | POA: Diagnosis not present

## 2016-10-11 DIAGNOSIS — J302 Other seasonal allergic rhinitis: Secondary | ICD-10-CM | POA: Diagnosis not present

## 2016-11-01 DIAGNOSIS — L820 Inflamed seborrheic keratosis: Secondary | ICD-10-CM | POA: Diagnosis not present

## 2016-11-01 DIAGNOSIS — L821 Other seborrheic keratosis: Secondary | ICD-10-CM | POA: Diagnosis not present

## 2016-11-15 ENCOUNTER — Encounter: Payer: Self-pay | Admitting: Neurology

## 2016-11-15 ENCOUNTER — Ambulatory Visit (INDEPENDENT_AMBULATORY_CARE_PROVIDER_SITE_OTHER): Payer: Medicare Other | Admitting: Neurology

## 2016-11-15 VITALS — BP 175/90 | HR 69 | Ht 59.0 in | Wt 131.0 lb

## 2016-11-15 DIAGNOSIS — R42 Dizziness and giddiness: Secondary | ICD-10-CM | POA: Diagnosis not present

## 2016-11-15 MED ORDER — TRAMADOL HCL 50 MG PO TABS: 50.0000 mg | ORAL_TABLET | Freq: Four times a day (QID) | ORAL | 1 refills | Status: DC | PRN

## 2016-11-15 MED ORDER — TOPIRAMATE 25 MG PO TABS: ORAL_TABLET | ORAL | 5 refills | Status: DC

## 2016-11-15 NOTE — Progress Notes (Signed)
Reason for visit: Vertigo  Nichole Delgado is an 65 y.o. female  History of present illness:  Nichole Delgado is a 65 year old right-handed white female with a history of episodic vertigo. MRI of the brain was relatively unremarkable, the patient has had some headaches in August 2017, and she was placed on Topamax for the vertigo and headache. The patient has done quite well on this medication, she has had one episode of vertigo that was less severe, she can function through it, and it lasted 1 or 2 days. The patient has recently had a recurrence of neck pain and stiffness, she is followed by Dr. Lynann Bologna for this, she may occasionally get epidural steroid injections. The patient returns to the office today for an evaluation. She believes that the Topamax has offered good improvement in her symptoms.  Past Medical History:  Diagnosis Date  . Anxiety   . Vertigo 07/07/2016    Past Surgical History:  Procedure Laterality Date  . CATARACT EXTRACTION Bilateral   . FOOT SURGERY  2011  . PARTIAL HYSTERECTOMY  1988    Family History  Problem Relation Age of Onset  . Heart attack Father   . Migraines Mother   . Cancer Sister     Pancreatic  . Cancer Brother     Liver  . Migraines Daughter     Social history:  reports that she has been smoking.  She has a 700.00 pack-year smoking history. She has never used smokeless tobacco. She reports that she does not drink alcohol. Her drug history is not on file.    Allergies  Allergen Reactions  . Codeine Nausea Only  . Penicillins   . Sulfa Antibiotics     Medications:  Prior to Admission medications   Medication Sig Start Date End Date Taking? Authorizing Provider  ALPRAZolam Duanne Moron) 0.5 MG tablet Take 2 tablets approximately 45 minutes prior to the MRI study, take a third tablet if needed. 07/26/16  Yes Kathrynn Ducking, MD  fluticasone Asencion Islam) 50 MCG/ACT nasal spray  02/25/16  Yes Historical Provider, MD  meclizine (ANTIVERT) 25 MG  tablet Take 25 mg by mouth. 02/25/16  Yes Historical Provider, MD  meloxicam (MOBIC) 7.5 MG tablet Take 7.5 mg by mouth.   Yes Historical Provider, MD  omeprazole (PRILOSEC) 40 MG capsule  05/19/16  Yes Historical Provider, MD  pravastatin (PRAVACHOL) 40 MG tablet  06/15/16  Yes Historical Provider, MD  rizatriptan (MAXALT-MLT) 10 MG disintegrating tablet Take 1 tablet (10 mg total) by mouth 3 (three) times daily as needed for migraine. 08/09/16  Yes Kathrynn Ducking, MD  thiothixene (NAVANE) 2 MG capsule Take by mouth.   Yes Historical Provider, MD  topiramate (TOPAMAX) 25 MG tablet Take one tablet at night for one week, then take 2 tablets at night Patient taking differently: Take 50 mg by mouth at bedtime. Take one tablet at night for one week, then take 2 tablets at night 08/09/16  Yes Kathrynn Ducking, MD  zolpidem (AMBIEN) 10 MG tablet Take 10 mg by mouth.   Yes Historical Provider, MD  ipratropium (ATROVENT) 0.03 % nasal spray Place 2 sprays into the nose 2 (two) times daily. 02/03/12 02/02/13  Chelle Jeffery, PA-C    ROS:  Out of a complete 14 system review of symptoms, the patient complains only of the following symptoms, and all other reviewed systems are negative.  Abdominal pain, diarrhea Neck pain, joint pain, neck stiffness  Blood pressure (!) 175/90, pulse 69,  height 4\' 11"  (1.499 m), weight 131 lb (59.4 kg).  Physical Exam  General: The patient is alert and cooperative at the time of the examination.  Neuromuscular: Patient lacks about 20 of full lateral rotation bilaterally of the cervical spine.  Skin: No significant peripheral edema is noted.   Neurologic Exam  Mental status: The patient is alert and oriented x 3 at the time of the examination. The patient has apparent normal recent and remote memory, with an apparently normal attention span and concentration ability.   Cranial nerves: Facial symmetry is present. Speech is normal, no aphasia or dysarthria is noted.  Extraocular movements are full. Visual fields are full.  Motor: The patient has good strength in all 4 extremities.  Sensory examination: Soft touch sensation is symmetric on the face, arms, and legs.  Coordination: The patient has good finger-nose-finger and heel-to-shin bilaterally.  Gait and station: The patient has a normal gait. Tandem gait is normal. Romberg is negative. No drift is seen.  Reflexes: Deep tendon reflexes are symmetric.   Assessment/Plan:  1. Episodic vertigo, likely migrainous  2. Cervical spondylosis  The patient will be given a prescription for Ultram for pain, she will be following up with Dr. Lynann Bologna for her neck issues. The patient will continue the Topamax for now, if she continues to do well, this medication can be tapered off in the future. She will follow-up in 6 months.  Jill Alexanders MD 11/15/2016 3:14 PM  [facility] [address] [place], [place] [address]  Phone [phone] Fax [phone]

## 2016-11-16 ENCOUNTER — Ambulatory Visit: Payer: Medicare Other | Admitting: Neurology

## 2016-11-16 ENCOUNTER — Telehealth: Payer: Self-pay | Admitting: Neurology

## 2016-11-16 NOTE — Telephone Encounter (Signed)
Pt called said when she left the office yuesterday she picked up pizza on the way home. Sts she was sitting on the couch and a vertigo "spell hit" lasting about 5 hrs. She was N&V 3 times. Her neck is better today.

## 2016-11-16 NOTE — Telephone Encounter (Signed)
I called the patient. The patient had an episode yesterday of headache in the back of the head, involving the ears, onset of vertigo. She is feeling better today.  If the events become more frequent, we can increase the Topamax dosing.

## 2016-11-18 ENCOUNTER — Telehealth: Payer: Self-pay | Admitting: Neurology

## 2016-11-18 NOTE — Telephone Encounter (Signed)
Chart updated w/ new provider info.

## 2016-11-18 NOTE — Telephone Encounter (Signed)
Patient is calling to advise of her new PCP who is Leighton Ruff @ [facility] on [address] if that can be changed in the chart.

## 2016-11-23 DIAGNOSIS — N941 Unspecified dyspareunia: Secondary | ICD-10-CM | POA: Diagnosis not present

## 2016-11-23 DIAGNOSIS — N762 Acute vulvitis: Secondary | ICD-10-CM | POA: Diagnosis not present

## 2016-11-25 ENCOUNTER — Ambulatory Visit: Payer: Medicare Other | Admitting: Podiatry

## 2016-12-04 DIAGNOSIS — H1132 Conjunctival hemorrhage, left eye: Secondary | ICD-10-CM | POA: Diagnosis not present

## 2016-12-06 DIAGNOSIS — K580 Irritable bowel syndrome with diarrhea: Secondary | ICD-10-CM | POA: Diagnosis not present

## 2016-12-06 DIAGNOSIS — K589 Irritable bowel syndrome without diarrhea: Secondary | ICD-10-CM | POA: Diagnosis not present

## 2016-12-28 DIAGNOSIS — N762 Acute vulvitis: Secondary | ICD-10-CM | POA: Diagnosis not present

## 2017-01-10 DIAGNOSIS — G43909 Migraine, unspecified, not intractable, without status migrainosus: Secondary | ICD-10-CM | POA: Diagnosis not present

## 2017-01-10 DIAGNOSIS — E878 Other disorders of electrolyte and fluid balance, not elsewhere classified: Secondary | ICD-10-CM | POA: Diagnosis not present

## 2017-01-10 DIAGNOSIS — F99 Mental disorder, not otherwise specified: Secondary | ICD-10-CM | POA: Diagnosis not present

## 2017-01-13 DIAGNOSIS — M545 Low back pain: Secondary | ICD-10-CM | POA: Diagnosis not present

## 2017-01-13 DIAGNOSIS — S83207A Unspecified tear of unspecified meniscus, current injury, left knee, initial encounter: Secondary | ICD-10-CM | POA: Diagnosis not present

## 2017-01-13 DIAGNOSIS — M5442 Lumbago with sciatica, left side: Secondary | ICD-10-CM | POA: Diagnosis not present

## 2017-01-17 ENCOUNTER — Telehealth: Payer: Self-pay | Admitting: Neurology

## 2017-01-17 MED ORDER — TOPIRAMATE ER 50 MG PO CAP24: 50.0000 mg | ORAL_CAPSULE | Freq: Every day | ORAL | 2 refills | Status: DC

## 2017-01-17 NOTE — Addendum Note (Signed)
Addended by: Margette Fast on: 01/17/2017 05:07 PM   Modules accepted: Orders

## 2017-01-17 NOTE — Telephone Encounter (Signed)
I called the patient. The patient is having some nausea on the Topamax after she stopped nortriptyline. She had been on Topamax for quite some time without difficulty.  We will try Trokendi for a month to see if this helps the nausea.  I considered the use of Zonegran, but the patient has sulfa drug allergies, she gets a severe rash with sulfa drugs.

## 2017-01-17 NOTE — Telephone Encounter (Signed)
Patient called in reference to Topamax states it is causing nausea and wanting to know if she can get something called in for this.  Please call

## 2017-01-19 DIAGNOSIS — H109 Unspecified conjunctivitis: Secondary | ICD-10-CM | POA: Diagnosis not present

## 2017-01-20 DIAGNOSIS — F250 Schizoaffective disorder, bipolar type: Secondary | ICD-10-CM | POA: Diagnosis not present

## 2017-01-20 DIAGNOSIS — M545 Low back pain: Secondary | ICD-10-CM | POA: Diagnosis not present

## 2017-01-21 DIAGNOSIS — N951 Menopausal and female climacteric states: Secondary | ICD-10-CM | POA: Diagnosis not present

## 2017-01-24 DIAGNOSIS — E039 Hypothyroidism, unspecified: Secondary | ICD-10-CM | POA: Diagnosis not present

## 2017-01-24 DIAGNOSIS — N958 Other specified menopausal and perimenopausal disorders: Secondary | ICD-10-CM | POA: Diagnosis not present

## 2017-01-24 DIAGNOSIS — E538 Deficiency of other specified B group vitamins: Secondary | ICD-10-CM | POA: Diagnosis not present

## 2017-02-03 ENCOUNTER — Telehealth: Payer: Self-pay | Admitting: *Deleted

## 2017-02-03 NOTE — Telephone Encounter (Signed)
Called and initiated PA trokendi. [id number]. Spoke to [place]. He submitted to pharmacy for review. Response typically in 24-48hr per Ovid Curd. They will fax answer. Awaiting response.   [employer] [id number] Group #: [id number]

## 2017-02-07 ENCOUNTER — Encounter: Payer: Self-pay | Admitting: Neurology

## 2017-02-07 NOTE — Telephone Encounter (Signed)
I will write a letter regarding the use of this medication.

## 2017-02-07 NOTE — Telephone Encounter (Signed)
Dr Jannifer Franklin- do you want to try a different medication or proceed with appeal letter?  Received fax notification. PA denied. Drug is non-formulary.

## 2017-02-07 NOTE — Telephone Encounter (Signed)
Faxed appeal letter to attn: [employer] and [employer]. Fax: [phone]. Received confirmation. Awaiting response.

## 2017-02-08 DIAGNOSIS — J209 Acute bronchitis, unspecified: Secondary | ICD-10-CM | POA: Diagnosis not present

## 2017-02-08 DIAGNOSIS — R05 Cough: Secondary | ICD-10-CM | POA: Diagnosis not present

## 2017-02-08 DIAGNOSIS — F17200 Nicotine dependence, unspecified, uncomplicated: Secondary | ICD-10-CM | POA: Diagnosis not present

## 2017-02-08 NOTE — Telephone Encounter (Signed)
Received fax request asking for additional information. Gave to Dr Jannifer Franklin for review.

## 2017-02-08 NOTE — Telephone Encounter (Signed)
Whitney with [employer] is calling.and has questions about the appeal letter received for medication Trokendi.

## 2017-02-08 NOTE — Telephone Encounter (Signed)
Called and LVM returning call from [place]. Gave [facility] phone number for call back.

## 2017-02-09 NOTE — Telephone Encounter (Signed)
Nichole Delgado [phone] says the members medication appeal was denied. She asked form to be faxed back asap so it can be reopened and reviewed.

## 2017-02-10 ENCOUNTER — Telehealth: Payer: Self-pay | Admitting: Neurology

## 2017-02-10 NOTE — Telephone Encounter (Signed)
I called patient. The patient got the prescription for Trokendi, she could not tolerate this either. She does not wish to continue the medication.  She cannot take Zonegran as she has a sulfa allergy.  She does not wish to go on any new medications, we may try Depakote in the future. She will call our office when she is ready to start new medication.

## 2017-02-11 NOTE — Telephone Encounter (Signed)
See other phone note from CW,MD on 02/10/17. Pt does not want to continue Trokendi at this time.

## 2017-02-15 DIAGNOSIS — M545 Low back pain: Secondary | ICD-10-CM | POA: Diagnosis not present

## 2017-02-21 DIAGNOSIS — M5416 Radiculopathy, lumbar region: Secondary | ICD-10-CM | POA: Diagnosis not present

## 2017-03-10 DIAGNOSIS — M5416 Radiculopathy, lumbar region: Secondary | ICD-10-CM | POA: Diagnosis not present

## 2017-03-10 DIAGNOSIS — M79672 Pain in left foot: Secondary | ICD-10-CM | POA: Diagnosis not present

## 2017-03-25 DIAGNOSIS — M5416 Radiculopathy, lumbar region: Secondary | ICD-10-CM | POA: Diagnosis not present

## 2017-03-26 ENCOUNTER — Emergency Department (HOSPITAL_COMMUNITY): Payer: Medicare Other

## 2017-03-26 ENCOUNTER — Inpatient Hospital Stay (HOSPITAL_COMMUNITY)
Admission: EM | Admit: 2017-03-26 | Discharge: 2017-03-30 | DRG: 493 | Disposition: A | Discharge status: Skilled Nursing Facility | Payer: Medicare Other | Attending: Internal Medicine | Admitting: Internal Medicine

## 2017-03-26 ENCOUNTER — Encounter (HOSPITAL_COMMUNITY): Payer: Self-pay | Admitting: *Deleted

## 2017-03-26 DIAGNOSIS — J309 Allergic rhinitis, unspecified: Secondary | ICD-10-CM | POA: Diagnosis not present

## 2017-03-26 DIAGNOSIS — Z8249 Family history of ischemic heart disease and other diseases of the circulatory system: Secondary | ICD-10-CM

## 2017-03-26 DIAGNOSIS — Z6826 Body mass index (BMI) 26.0-26.9, adult: Secondary | ICD-10-CM | POA: Diagnosis not present

## 2017-03-26 DIAGNOSIS — R42 Dizziness and giddiness: Secondary | ICD-10-CM

## 2017-03-26 DIAGNOSIS — R519 Headache, unspecified: Secondary | ICD-10-CM

## 2017-03-26 DIAGNOSIS — E441 Mild protein-calorie malnutrition: Secondary | ICD-10-CM | POA: Diagnosis present

## 2017-03-26 DIAGNOSIS — S0990XA Unspecified injury of head, initial encounter: Secondary | ICD-10-CM | POA: Diagnosis not present

## 2017-03-26 DIAGNOSIS — M21372 Foot drop, left foot: Secondary | ICD-10-CM | POA: Diagnosis not present

## 2017-03-26 DIAGNOSIS — S82841D Displaced bimalleolar fracture of right lower leg, subsequent encounter for closed fracture with routine healing: Secondary | ICD-10-CM | POA: Diagnosis not present

## 2017-03-26 DIAGNOSIS — R911 Solitary pulmonary nodule: Secondary | ICD-10-CM | POA: Diagnosis present

## 2017-03-26 DIAGNOSIS — Z90710 Acquired absence of both cervix and uterus: Secondary | ICD-10-CM | POA: Diagnosis not present

## 2017-03-26 DIAGNOSIS — S92001A Unspecified fracture of right calcaneus, initial encounter for closed fracture: Secondary | ICD-10-CM | POA: Diagnosis not present

## 2017-03-26 DIAGNOSIS — G43901 Migraine, unspecified, not intractable, with status migrainosus: Secondary | ICD-10-CM | POA: Diagnosis not present

## 2017-03-26 DIAGNOSIS — K219 Gastro-esophageal reflux disease without esophagitis: Secondary | ICD-10-CM | POA: Diagnosis not present

## 2017-03-26 DIAGNOSIS — G8918 Other acute postprocedural pain: Secondary | ICD-10-CM | POA: Diagnosis not present

## 2017-03-26 DIAGNOSIS — E46 Unspecified protein-calorie malnutrition: Secondary | ICD-10-CM

## 2017-03-26 DIAGNOSIS — Y92015 Private garage of single-family (private) house as the place of occurrence of the external cause: Secondary | ICD-10-CM

## 2017-03-26 DIAGNOSIS — T148XXA Other injury of unspecified body region, initial encounter: Secondary | ICD-10-CM

## 2017-03-26 DIAGNOSIS — S0101XA Laceration without foreign body of scalp, initial encounter: Secondary | ICD-10-CM | POA: Diagnosis present

## 2017-03-26 DIAGNOSIS — S0181XA Laceration without foreign body of other part of head, initial encounter: Secondary | ICD-10-CM | POA: Diagnosis not present

## 2017-03-26 DIAGNOSIS — E785 Hyperlipidemia, unspecified: Secondary | ICD-10-CM | POA: Diagnosis not present

## 2017-03-26 DIAGNOSIS — H8149 Vertigo of central origin, unspecified ear: Secondary | ICD-10-CM | POA: Diagnosis not present

## 2017-03-26 DIAGNOSIS — R2689 Other abnormalities of gait and mobility: Secondary | ICD-10-CM | POA: Diagnosis not present

## 2017-03-26 DIAGNOSIS — F17210 Nicotine dependence, cigarettes, uncomplicated: Secondary | ICD-10-CM | POA: Diagnosis present

## 2017-03-26 DIAGNOSIS — D72828 Other elevated white blood cell count: Secondary | ICD-10-CM | POA: Diagnosis present

## 2017-03-26 DIAGNOSIS — S82892D Other fracture of left lower leg, subsequent encounter for closed fracture with routine healing: Secondary | ICD-10-CM | POA: Diagnosis not present

## 2017-03-26 DIAGNOSIS — K589 Irritable bowel syndrome without diarrhea: Secondary | ICD-10-CM | POA: Diagnosis present

## 2017-03-26 DIAGNOSIS — D72829 Elevated white blood cell count, unspecified: Secondary | ICD-10-CM | POA: Diagnosis not present

## 2017-03-26 DIAGNOSIS — S82892A Other fracture of left lower leg, initial encounter for closed fracture: Secondary | ICD-10-CM | POA: Diagnosis not present

## 2017-03-26 DIAGNOSIS — S92909A Unspecified fracture of unspecified foot, initial encounter for closed fracture: Secondary | ICD-10-CM | POA: Diagnosis not present

## 2017-03-26 DIAGNOSIS — S82891A Other fracture of right lower leg, initial encounter for closed fracture: Secondary | ICD-10-CM | POA: Diagnosis present

## 2017-03-26 DIAGNOSIS — W1789XA Other fall from one level to another, initial encounter: Secondary | ICD-10-CM | POA: Diagnosis present

## 2017-03-26 DIAGNOSIS — S82891D Other fracture of right lower leg, subsequent encounter for closed fracture with routine healing: Secondary | ICD-10-CM | POA: Diagnosis not present

## 2017-03-26 DIAGNOSIS — F17200 Nicotine dependence, unspecified, uncomplicated: Secondary | ICD-10-CM | POA: Diagnosis present

## 2017-03-26 DIAGNOSIS — S060X0A Concussion without loss of consciousness, initial encounter: Secondary | ICD-10-CM | POA: Diagnosis present

## 2017-03-26 DIAGNOSIS — Z791 Long term (current) use of non-steroidal anti-inflammatories (NSAID): Secondary | ICD-10-CM

## 2017-03-26 DIAGNOSIS — M62838 Other muscle spasm: Secondary | ICD-10-CM | POA: Diagnosis present

## 2017-03-26 DIAGNOSIS — S098XXA Other specified injuries of head, initial encounter: Secondary | ICD-10-CM | POA: Diagnosis not present

## 2017-03-26 DIAGNOSIS — M5137 Other intervertebral disc degeneration, lumbosacral region: Secondary | ICD-10-CM | POA: Diagnosis not present

## 2017-03-26 DIAGNOSIS — W19XXXD Unspecified fall, subsequent encounter: Secondary | ICD-10-CM | POA: Diagnosis not present

## 2017-03-26 DIAGNOSIS — M6281 Muscle weakness (generalized): Secondary | ICD-10-CM | POA: Diagnosis not present

## 2017-03-26 DIAGNOSIS — R51 Headache: Secondary | ICD-10-CM

## 2017-03-26 DIAGNOSIS — M25579 Pain in unspecified ankle and joints of unspecified foot: Secondary | ICD-10-CM

## 2017-03-26 DIAGNOSIS — Z4789 Encounter for other orthopedic aftercare: Secondary | ICD-10-CM | POA: Diagnosis not present

## 2017-03-26 DIAGNOSIS — M25572 Pain in left ankle and joints of left foot: Secondary | ICD-10-CM | POA: Diagnosis not present

## 2017-03-26 DIAGNOSIS — S82831A Other fracture of upper and lower end of right fibula, initial encounter for closed fracture: Secondary | ICD-10-CM | POA: Diagnosis present

## 2017-03-26 DIAGNOSIS — M545 Low back pain: Secondary | ICD-10-CM | POA: Diagnosis not present

## 2017-03-26 DIAGNOSIS — M51379 Other intervertebral disc degeneration, lumbosacral region without mention of lumbar back pain or lower extremity pain: Secondary | ICD-10-CM

## 2017-03-26 DIAGNOSIS — F209 Schizophrenia, unspecified: Secondary | ICD-10-CM | POA: Diagnosis not present

## 2017-03-26 DIAGNOSIS — M5136 Other intervertebral disc degeneration, lumbar region: Secondary | ICD-10-CM | POA: Diagnosis not present

## 2017-03-26 DIAGNOSIS — M25571 Pain in right ankle and joints of right foot: Secondary | ICD-10-CM | POA: Diagnosis not present

## 2017-03-26 DIAGNOSIS — S82852A Displaced trimalleolar fracture of left lower leg, initial encounter for closed fracture: Secondary | ICD-10-CM | POA: Diagnosis not present

## 2017-03-26 DIAGNOSIS — Z720 Tobacco use: Secondary | ICD-10-CM | POA: Diagnosis not present

## 2017-03-26 DIAGNOSIS — Z9181 History of falling: Secondary | ICD-10-CM | POA: Diagnosis not present

## 2017-03-26 DIAGNOSIS — R937 Abnormal findings on diagnostic imaging of other parts of musculoskeletal system: Secondary | ICD-10-CM | POA: Diagnosis not present

## 2017-03-26 DIAGNOSIS — M7989 Other specified soft tissue disorders: Secondary | ICD-10-CM | POA: Diagnosis not present

## 2017-03-26 DIAGNOSIS — W19XXXA Unspecified fall, initial encounter: Secondary | ICD-10-CM | POA: Diagnosis not present

## 2017-03-26 DIAGNOSIS — S199XXA Unspecified injury of neck, initial encounter: Secondary | ICD-10-CM | POA: Diagnosis not present

## 2017-03-26 DIAGNOSIS — S82842A Displaced bimalleolar fracture of left lower leg, initial encounter for closed fracture: Secondary | ICD-10-CM | POA: Diagnosis not present

## 2017-03-26 DIAGNOSIS — S9031XA Contusion of right foot, initial encounter: Secondary | ICD-10-CM | POA: Diagnosis not present

## 2017-03-26 DIAGNOSIS — R278 Other lack of coordination: Secondary | ICD-10-CM | POA: Diagnosis not present

## 2017-03-26 DIAGNOSIS — S92001D Unspecified fracture of right calcaneus, subsequent encounter for fracture with routine healing: Secondary | ICD-10-CM | POA: Diagnosis not present

## 2017-03-26 DIAGNOSIS — S8252XA Displaced fracture of medial malleolus of left tibia, initial encounter for closed fracture: Secondary | ICD-10-CM | POA: Diagnosis not present

## 2017-03-26 DIAGNOSIS — F419 Anxiety disorder, unspecified: Secondary | ICD-10-CM | POA: Diagnosis not present

## 2017-03-26 DIAGNOSIS — M24271 Disorder of ligament, right ankle: Secondary | ICD-10-CM | POA: Diagnosis not present

## 2017-03-26 DIAGNOSIS — E569 Vitamin deficiency, unspecified: Secondary | ICD-10-CM | POA: Diagnosis not present

## 2017-03-26 DIAGNOSIS — S82842D Displaced bimalleolar fracture of left lower leg, subsequent encounter for closed fracture with routine healing: Secondary | ICD-10-CM | POA: Diagnosis not present

## 2017-03-26 DIAGNOSIS — G8911 Acute pain due to trauma: Secondary | ICD-10-CM | POA: Diagnosis not present

## 2017-03-26 DIAGNOSIS — E8809 Other disorders of plasma-protein metabolism, not elsewhere classified: Secondary | ICD-10-CM

## 2017-03-26 DIAGNOSIS — S82841A Displaced bimalleolar fracture of right lower leg, initial encounter for closed fracture: Secondary | ICD-10-CM | POA: Diagnosis not present

## 2017-03-26 HISTORY — DX: Migraine, unspecified, not intractable, without status migrainosus: G43.909

## 2017-03-26 HISTORY — DX: Spondylosis without myelopathy or radiculopathy, cervical region: M47.812

## 2017-03-26 HISTORY — DX: Hyperlipidemia, unspecified: E78.5

## 2017-03-26 HISTORY — DX: Irritable bowel syndrome without diarrhea: K58.9

## 2017-03-26 HISTORY — DX: Tobacco use: Z72.0

## 2017-03-26 LAB — BASIC METABOLIC PANEL
ANION GAP: 11 (ref 5–15)
BUN: 14 mg/dL (ref 6–20)
CO2: 23 mmol/L (ref 22–32)
Calcium: 9.5 mg/dL (ref 8.9–10.3)
Chloride: 105 mmol/L (ref 101–111)
Creatinine, Ser: 0.89 mg/dL (ref 0.44–1.00)
GFR calc Af Amer: >60 mL/min (ref >60)
GFR calc non Af Amer: >60 mL/min (ref >60)
Glucose, Bld: 106 mg/dL — ABNORMAL HIGH (ref 65–99)
Potassium: 4.2 mmol/L (ref 3.5–5.1)
SODIUM: 139 mmol/L (ref 135–145)

## 2017-03-26 LAB — CBC WITH DIFFERENTIAL/PLATELET
BASOS ABS: 0.1 10*3/uL (ref 0.0–0.1)
Basophils Relative: 0 %
Eosinophils Absolute: 0 10*3/uL (ref 0.0–0.7)
Eosinophils Relative: 0 %
HEMATOCRIT: 45.7 % (ref 36.0–46.0)
HEMOGLOBIN: 15.5 g/dL — AB (ref 12.0–15.0)
LYMPHS PCT: 12 %
Lymphs Abs: 2.3 10*3/uL (ref 0.7–4.0)
MCH: 30.6 pg (ref 26.0–34.0)
MCHC: 33.9 g/dL (ref 30.0–36.0)
MCV: 90.3 fL (ref 78.0–100.0)
Monocytes Absolute: 0.9 10*3/uL (ref 0.1–1.0)
Monocytes Relative: 5 %
NEUTROS ABS: 15.3 10*3/uL — AB (ref 1.7–7.7)
NEUTROS PCT: 83 %
PLATELETS: 224 10*3/uL (ref 150–400)
RBC: 5.06 MIL/uL (ref 3.87–5.11)
RDW: 13.1 % (ref 11.5–15.5)
WBC: 18.5 10*3/uL — AB (ref 4.0–10.5)

## 2017-03-26 LAB — PROTIME-INR
INR: 0.94
Prothrombin Time: 12.5 seconds (ref 11.4–15.2)

## 2017-03-26 LAB — TYPE AND SCREEN
ABO/RH(D): B NEG
Antibody Screen: NEGATIVE

## 2017-03-26 LAB — APTT: APTT: 30 s (ref 24–36)

## 2017-03-26 MED ORDER — HYDROMORPHONE HCL 1 MG/ML IJ SOLN
1.0000 mg | Freq: Once | INTRAMUSCULAR | Status: AC
  Administered 2017-03-26: 1 mg via INTRAVENOUS
  Filled 2017-03-26: qty 1

## 2017-03-26 MED ORDER — PROPOFOL 10 MG/ML IV BOLUS
INTRAVENOUS | Status: AC
  Filled 2017-03-26: qty 20

## 2017-03-26 MED ORDER — SODIUM CHLORIDE 0.9 % IV SOLN
INTRAVENOUS | Status: DC
  Administered 2017-03-27: 75 mL/h via INTRAVENOUS

## 2017-03-26 MED ORDER — MECLIZINE HCL 25 MG PO TABS
25.0000 mg | ORAL_TABLET | Freq: Every day | ORAL | Status: DC | PRN
  Filled 2017-03-26: qty 1

## 2017-03-26 MED ORDER — OXYCODONE-ACETAMINOPHEN 5-325 MG PO TABS
1.0000 | ORAL_TABLET | ORAL | Status: DC | PRN
  Administered 2017-03-26 – 2017-03-27 (×3): 1 via ORAL
  Filled 2017-03-26 (×3): qty 1

## 2017-03-26 MED ORDER — CEFAZOLIN SODIUM-DEXTROSE 2-4 GM/100ML-% IV SOLN
2.0000 g | INTRAVENOUS | Status: AC
  Administered 2017-03-27: 2 g via INTRAVENOUS
  Filled 2017-03-26 (×2): qty 100

## 2017-03-26 MED ORDER — ALPRAZOLAM 0.25 MG PO TABS: 0.2500 mg | ORAL_TABLET | Freq: Two times a day (BID) | ORAL | Status: DC | PRN

## 2017-03-26 MED ORDER — ZOLPIDEM TARTRATE 5 MG PO TABS
5.0000 mg | ORAL_TABLET | Freq: Every evening | ORAL | Status: DC | PRN
  Administered 2017-03-26 – 2017-03-29 (×4): 5 mg via ORAL
  Filled 2017-03-26 (×5): qty 1

## 2017-03-26 MED ORDER — MORPHINE SULFATE (PF) 4 MG/ML IV SOLN
2.0000 mg | INTRAVENOUS | Status: DC | PRN
  Administered 2017-03-27 (×3): 2 mg via INTRAVENOUS
  Filled 2017-03-26 (×3): qty 1

## 2017-03-26 MED ORDER — ELUXADOLINE 75 MG PO TABS
75.0000 mg | ORAL_TABLET | Freq: Two times a day (BID) | ORAL | Status: DC
  Administered 2017-03-27 – 2017-03-30 (×6): 75 mg via ORAL

## 2017-03-26 MED ORDER — PRAVASTATIN SODIUM 40 MG PO TABS
40.0000 mg | ORAL_TABLET | Freq: Every day | ORAL | Status: DC
  Administered 2017-03-26 – 2017-03-29 (×4): 40 mg via ORAL
  Filled 2017-03-26 (×4): qty 1

## 2017-03-26 MED ORDER — FLUTICASONE PROPIONATE 50 MCG/ACT NA SUSP
2.0000 | Freq: Every day | NASAL | Status: DC
  Filled 2017-03-26: qty 16

## 2017-03-26 MED ORDER — MELOXICAM 7.5 MG PO TABS: 7.5000 mg | ORAL_TABLET | Freq: Every day | ORAL | Status: DC

## 2017-03-26 MED ORDER — SUMATRIPTAN SUCCINATE 50 MG PO TABS
50.0000 mg | ORAL_TABLET | ORAL | Status: DC | PRN
  Filled 2017-03-26: qty 1

## 2017-03-26 MED ORDER — NICOTINE 21 MG/24HR TD PT24
21.0000 mg | MEDICATED_PATCH | Freq: Every day | TRANSDERMAL | Status: DC
  Filled 2017-03-26 (×2): qty 1

## 2017-03-26 MED ORDER — ACETAMINOPHEN 325 MG PO TABS: 650.0000 mg | ORAL_TABLET | Freq: Four times a day (QID) | ORAL | Status: DC | PRN

## 2017-03-26 MED ORDER — IPRATROPIUM BROMIDE 0.06 % NA SOLN
2.0000 | Freq: Two times a day (BID) | NASAL | Status: DC
  Filled 2017-03-26: qty 15

## 2017-03-26 MED ORDER — LACTATED RINGERS IV SOLN
INTRAVENOUS | Status: DC
  Administered 2017-03-27: 10:00:00 via INTRAVENOUS

## 2017-03-26 MED ORDER — METHOCARBAMOL 500 MG PO TABS
500.0000 mg | ORAL_TABLET | Freq: Three times a day (TID) | ORAL | Status: DC | PRN
  Administered 2017-03-29: 500 mg via ORAL
  Filled 2017-03-26: qty 1

## 2017-03-26 MED ORDER — SENNOSIDES-DOCUSATE SODIUM 8.6-50 MG PO TABS
1.0000 | ORAL_TABLET | Freq: Every evening | ORAL | Status: DC | PRN
  Filled 2017-03-26: qty 1

## 2017-03-26 MED ORDER — CHLORHEXIDINE GLUCONATE 4 % EX LIQD: 60.0000 mL | Freq: Once | CUTANEOUS | Status: DC

## 2017-03-26 MED ORDER — PROPOFOL 10 MG/ML IV BOLUS
INTRAVENOUS | Status: AC | PRN
  Administered 2017-03-26 (×5): 20 mg via INTRAVENOUS

## 2017-03-26 MED ORDER — PERPHENAZINE 4 MG PO TABS
4.0000 mg | ORAL_TABLET | Freq: Every day | ORAL | Status: DC
  Administered 2017-03-27 – 2017-03-29 (×2): 4 mg via ORAL
  Filled 2017-03-26 (×5): qty 1

## 2017-03-26 MED ORDER — ACETAMINOPHEN 500 MG PO TABS
1000.0000 mg | ORAL_TABLET | Freq: Once | ORAL | Status: AC
  Administered 2017-03-26: 1000 mg via ORAL
  Filled 2017-03-26: qty 2

## 2017-03-26 MED ORDER — ONDANSETRON HCL 4 MG/2ML IJ SOLN
4.0000 mg | Freq: Three times a day (TID) | INTRAMUSCULAR | Status: DC | PRN
  Administered 2017-03-27 – 2017-03-29 (×3): 4 mg via INTRAVENOUS
  Filled 2017-03-26 (×5): qty 2

## 2017-03-26 MED ORDER — PROPOFOL 10 MG/ML IV BOLUS: 2.0000 mg/kg | Freq: Once | INTRAVENOUS | Status: DC

## 2017-03-26 MED ORDER — CEFAZOLIN SODIUM-DEXTROSE 2-4 GM/100ML-% IV SOLN
2.0000 g | INTRAVENOUS | Status: DC
  Filled 2017-03-26: qty 100

## 2017-03-26 MED ORDER — PANTOPRAZOLE SODIUM 40 MG PO TBEC
40.0000 mg | DELAYED_RELEASE_TABLET | Freq: Every day | ORAL | Status: DC
  Administered 2017-03-28 – 2017-03-30 (×3): 40 mg via ORAL
  Filled 2017-03-26 (×3): qty 1

## 2017-03-26 MED ORDER — IPRATROPIUM BROMIDE 0.03 % NA SOLN
2.0000 | Freq: Two times a day (BID) | NASAL | Status: DC
  Filled 2017-03-26 (×2): qty 30

## 2017-03-26 MED ORDER — CHLORHEXIDINE GLUCONATE 4 % EX LIQD
60.0000 mL | Freq: Once | CUTANEOUS | Status: AC
  Administered 2017-03-27: 4 via TOPICAL
  Filled 2017-03-26: qty 60

## 2017-03-26 NOTE — ED Notes (Signed)
Ortho paged. 

## 2017-03-26 NOTE — ED Notes (Signed)
Pt states she does not take any blood thinning medications.

## 2017-03-26 NOTE — ED Provider Notes (Signed)
Patient signed out to me at shift change.   Patient fell through ceiling landing on bilateral feet, then rolling backward and hitting head on wall.  CT head and neck are negative.  Lumbar films are negative.  Imaging of bilateral ankles as below.  Discussed with Dr. Percell Miller, who recommends admit to medicine.  Surgery in the morning.  NPO after midnight.    Results for orders placed or performed during the hospital encounter of 71/69/67  Basic metabolic panel  Result Value Ref Range   Sodium 139 135 - 145 mmol/L   Potassium 4.2 3.5 - 5.1 mmol/L   Chloride 105 101 - 111 mmol/L   CO2 23 22 - 32 mmol/L   Glucose, Bld 106 (H) 65 - 99 mg/dL   BUN 14 6 - 20 mg/dL   Creatinine, Ser 0.89 0.44 - 1.00 mg/dL   Calcium 9.5 8.9 - 10.3 mg/dL   GFR calc non Af Amer >60 >60 mL/min   GFR calc Af Amer >60 >60 mL/min   Anion gap 11 5 - 15  CBC with Differential  Result Value Ref Range   WBC 18.5 (H) 4.0 - 10.5 K/uL   RBC 5.06 3.87 - 5.11 MIL/uL   Hemoglobin 15.5 (H) 12.0 - 15.0 g/dL   HCT 45.7 36.0 - 46.0 %   MCV 90.3 78.0 - 100.0 fL   MCH 30.6 26.0 - 34.0 pg   MCHC 33.9 30.0 - 36.0 g/dL   RDW 13.1 11.5 - 15.5 %   Platelets 224 150 - 400 K/uL   Neutrophils Relative % 83 %   Neutro Abs 15.3 (H) 1.7 - 7.7 K/uL   Lymphocytes Relative 12 %   Lymphs Abs 2.3 0.7 - 4.0 K/uL   Monocytes Relative 5 %   Monocytes Absolute 0.9 0.1 - 1.0 K/uL   Eosinophils Relative 0 %   Eosinophils Absolute 0.0 0.0 - 0.7 K/uL   Basophils Relative 0 %   Basophils Absolute 0.1 0.0 - 0.1 K/uL   Dg Lumbar Spine Complete  Result Date: 03/26/2017 CLINICAL DATA:  Pt fell from attic at 1 pm today. Pt c/o right heel and lateral ankle pain, left ankle pain with medial bruising and swelling, and lower back pain. Only previous sx is to right great toe. EXAM: LUMBAR SPINE - COMPLETE 4+ VIEW COMPARISON:  None. FINDINGS: Minimal scoliosis which may be positional in nature. Alignment is otherwise normal. No evidence of acute  vertebral body subluxation. No fracture line or displaced fracture fragment seen. No compression fracture deformity. Minimal degenerative spurring within the upper and mid lumbar spine. No evidence of advanced degenerative change at any level. Facet joints appear intact and normally aligned. Upper sacrum appears intact and normally aligned. Paravertebral soft tissues are unremarkable. IMPRESSION: No acute findings. No fracture or acute subluxation within the lumbar spine. Electronically Signed   By: Franki Cabot M.D.   On: 03/26/2017 16:09   Dg Ankle Complete Left  Result Date: 03/26/2017 CLINICAL DATA:  Insert Clinic EXAM: LEFT ANKLE COMPLETE - 3+ VIEW COMPARISON:  None. FINDINGS: Displaced fracture within the medial malleolus, with lateral displacement of the fractured medial malleolus, and medial displacement of the distal tibia relative to the underlying talus. Associated distortion of the ankle mortise. Additional displaced fracture within the upper portion of the lateral malleolus. O fracture seen within the talar dome. No fracture seen within the visualized portions of the hindfoot or midfoot. IMPRESSION: 1. Displaced fracture within the medial malleolus. Associated medial displacement of the  distal tibia relative to the underlying talus. Associated distortion of the ankle mortise. 2. Additional displaced fracture within the distal fibula. Electronically Signed   By: Franki Cabot M.D.   On: 03/26/2017 16:08   Dg Ankle Complete Right  Result Date: 03/26/2017 CLINICAL DATA:  Pt fell from attic at 1 pm today. Pt c/o right heel and lateral ankle pain, left ankle pain with medial bruising and swelling, and lower back pain. Only previous sx is to right great toe. EXAM: RIGHT ANKLE - COMPLETE 3+ VIEW COMPARISON:  None. FINDINGS: Displaced/comminuted fracture within the distal right fibula. Distal tibia appears intact and normally aligned. Ankle mortise is symmetric. Displaced/comminuted fractures within the  posterior calcaneus. Probable associated diastases at the overlying subtalar joint spaces. IMPRESSION: 1. Displaced/comminuted fracture within the distal right fibula. 2. Displaced/comminuted fractures within the posterior calcaneus. Probable associated diastases at the overlying subtalar joint spaces. Consider CT for more definitive characterization of fracture extent and osseous alignment. Electronically Signed   By: Franki Cabot M.D.   On: 03/26/2017 16:05   Ct Head Wo Contrast  Result Date: 03/26/2017 CLINICAL DATA:  Status post fall. EXAM: CT HEAD WITHOUT CONTRAST CT CERVICAL SPINE WITHOUT CONTRAST TECHNIQUE: Multidetector CT imaging of the head and cervical spine was performed following the standard protocol without intravenous contrast. Multiplanar CT image reconstructions of the cervical spine were also generated. COMPARISON:  None. FINDINGS: CT HEAD FINDINGS Brain: Ventricles are normal in size and configuration. There is no hemorrhage, edema or other evidence of acute parenchymal abnormality. No extra-axial hemorrhage. Vascular: No hyperdense vessel or unexpected calcification. Skull: Normal. Negative for fracture or focal lesion. Sinuses/Orbits: No acute finding. Other: Soft tissue edema/laceration overlying the posterior left parietal-occipital bone. No underlying fracture. Slight deformity of the left nasal bone, likely chronic. CT CERVICAL SPINE FINDINGS Alignment: Mild straightening of the normal cervical lordosis related to underlying degenerative change. No evidence of acute vertebral body subluxation. Skull base and vertebrae: No fracture line or displaced fracture fragment identified. Facet joints appear intact and normally aligned throughout. Soft tissues and spinal canal: No prevertebral fluid or swelling. No visible canal hematoma. Disc levels: Mild degenerative spurring throughout the mid and lower cervical spine, with associated mild disc-osteophytic bulges at the C3-4 through C6-7  levels. No more than mild central canal stenosis at any level. Upper chest: Nodular consolidation within the medial aspects of the left lung apex, measuring 8 mm (series 302, image 90), possibly a semi-solid nodule, too small to definitively characterize. Other: Subcentimeter hypodense focus within the right thyroid lobe, most likely a benign colloid nodule. Carotid atherosclerosis. IMPRESSION: 1. Soft tissue edema/laceration overlying the left posterior parietal-occipital bone. No underlying fracture. 2. No acute intracranial abnormality. No intracranial hemorrhage or edema. 3. No fracture or acute subluxation within the cervical spine. Degenerative changes of the cervical spine, as detailed above. 4. **An incidental finding of potential clinical significance has been found. Nodular consolidation within the left lung apex, measuring 8 mm, possibly semi-solid nodule, alternatively chronic scarring or atelectasis. Follow-up non-contrast CT recommended at 3-6 months to confirm persistence. If unchanged, and solid component remains <6 mm, annual CT is recommended until 5 years of stability has been established. If persistent these nodules should be considered highly suspicious if the solid component of the nodule is 6 mm or greater in size and enlarging. This recommendation follows the consensus statement: Guidelines for Management of Incidental Pulmonary Nodules Detected on CT Images: From the Fleischner Society 2017; Radiology 2017; 284:228-243.** 5. Carotid atherosclerosis.  Electronically Signed   By: Franki Cabot M.D.   On: 03/26/2017 17:28   Ct Cervical Spine Wo Contrast  Result Date: 03/26/2017 CLINICAL DATA:  Status post fall. EXAM: CT HEAD WITHOUT CONTRAST CT CERVICAL SPINE WITHOUT CONTRAST TECHNIQUE: Multidetector CT imaging of the head and cervical spine was performed following the standard protocol without intravenous contrast. Multiplanar CT image reconstructions of the cervical spine were also  generated. COMPARISON:  None. FINDINGS: CT HEAD FINDINGS Brain: Ventricles are normal in size and configuration. There is no hemorrhage, edema or other evidence of acute parenchymal abnormality. No extra-axial hemorrhage. Vascular: No hyperdense vessel or unexpected calcification. Skull: Normal. Negative for fracture or focal lesion. Sinuses/Orbits: No acute finding. Other: Soft tissue edema/laceration overlying the posterior left parietal-occipital bone. No underlying fracture. Slight deformity of the left nasal bone, likely chronic. CT CERVICAL SPINE FINDINGS Alignment: Mild straightening of the normal cervical lordosis related to underlying degenerative change. No evidence of acute vertebral body subluxation. Skull base and vertebrae: No fracture line or displaced fracture fragment identified. Facet joints appear intact and normally aligned throughout. Soft tissues and spinal canal: No prevertebral fluid or swelling. No visible canal hematoma. Disc levels: Mild degenerative spurring throughout the mid and lower cervical spine, with associated mild disc-osteophytic bulges at the C3-4 through C6-7 levels. No more than mild central canal stenosis at any level. Upper chest: Nodular consolidation within the medial aspects of the left lung apex, measuring 8 mm (series 302, image 90), possibly a semi-solid nodule, too small to definitively characterize. Other: Subcentimeter hypodense focus within the right thyroid lobe, most likely a benign colloid nodule. Carotid atherosclerosis. IMPRESSION: 1. Soft tissue edema/laceration overlying the left posterior parietal-occipital bone. No underlying fracture. 2. No acute intracranial abnormality. No intracranial hemorrhage or edema. 3. No fracture or acute subluxation within the cervical spine. Degenerative changes of the cervical spine, as detailed above. 4. **An incidental finding of potential clinical significance has been found. Nodular consolidation within the left lung  apex, measuring 8 mm, possibly semi-solid nodule, alternatively chronic scarring or atelectasis. Follow-up non-contrast CT recommended at 3-6 months to confirm persistence. If unchanged, and solid component remains <6 mm, annual CT is recommended until 5 years of stability has been established. If persistent these nodules should be considered highly suspicious if the solid component of the nodule is 6 mm or greater in size and enlarging. This recommendation follows the consensus statement: Guidelines for Management of Incidental Pulmonary Nodules Detected on CT Images: From the Fleischner Society 2017; Radiology 2017; 284:228-243.** 5. Carotid atherosclerosis. Electronically Signed   By: Franki Cabot M.D.   On: 03/26/2017 17:28   Ct Ankle Right Wo Contrast  Result Date: 03/26/2017 CLINICAL DATA:  Right ankle fracture after falling through attic floor landing on both feet. EXAM: CT OF THE RIGHT ANKLE WITHOUT CONTRAST TECHNIQUE: Multidetector CT imaging of the right ankle was performed according to the standard protocol. Multiplanar CT image reconstructions were also generated. COMPARISON:  03/26/2017 ankle radiographs FINDINGS: Bones/Joint/Cartilage 1. Closed, comminuted distal fibular diaphyseal fracture 2.5 cm from the ankle mortise. Slight lateral angulation of the main distal fracture fragment with buckling and 3 mm of lateral displacement of a small butterfly fracture fragment. 2. 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 No encroachment on the sinus tarsi. 3. Fracture along the long axis of  the calcaneus is noted on the sagittal reformats through the body and posterior calcaneus with a coronal fracture fragment extending to the plantar surface off this fracture (series 201, image 17) and a second extending through the  posterior calcaneus (series 204, image 24). 3 mm of offset is noted at the posterior subtalar joint (series 204, image 20). 4. Tiny ossific density off the plantar dorsal aspect of the cuboid may represent a small avulsion, age indeterminate given its slightly rounded margins. 5. The ankle mortise is maintained. No distal tibial fracture is seen. The midfoot articulations are appear congruent. Ligaments Suboptimally assessed by CT. Muscles and Tendons Intact peroneal tendons. Intact Achilles. Intact extensor tendons crossing the ankle joint. Intact tibialis posterior and flexor digitorum longus tendons. The flexor hallucis is adjacent to one of the fracture lines medially and there is potential for entrapment, series 202, image 64. Soft tissues Diffuse soft tissue swelling along the dorsal plantar aspect of the foot. IMPRESSION: 1. Acute, closed, distal fibular diaphyseal fracture. 2. 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 3. Age-indeterminate tiny ossific density off the plantar dorsal aspect of the cuboid. Electronically Signed   By: Ashley Royalty M.D.   On: 03/26/2017 18:08   Dg Foot Complete Left  Result Date: 03/26/2017 CLINICAL DATA:  [facility] EXAM: LEFT FOOT - COMPLETE 3+ VIEW COMPARISON:  None. FINDINGS: No fracture line or displaced fracture fragment seen within the hindfoot, midfoot or forefoot. Displaced fractures of the medial malleolus and lateral malleolus are better demonstrated on the dedicated ankle exam. IMPRESSION: Negative left foot. Electronically Signed   By: Franki Cabot M.D.   On: 03/26/2017 16:10   Dg Foot Complete Right  Result Date: 03/26/2017 CLINICAL DATA:  Pt fell from attic at 1 pm today. Pt c/o right heel and lateral ankle pain, left ankle pain with medial bruising and swelling, and lower back pain. Only previous sx is to right  great toe. EXAM: RIGHT FOOT COMPLETE - 3+ VIEW COMPARISON:  None. FINDINGS: Displaced/comminuted fractures within the posterior calcaneus. Probable associated diastases at the overlying subtalar joint spaces. IMPRESSION: Displaced/comminuted fractures within the posterior calcaneus. Probable associated diastases at the overlying subtalar joint spaces. Consider CT for more definitive characterization of fracture extent and osseous alignment. Electronically Signed   By: Franki Cabot M.D.   On: 03/26/2017 16:04      Physical Exam  BP 130/75   Pulse 86   Temp 97.5 F (36.4 C) (Oral)   Resp 20   Wt 59.4 kg   SpO2 97%   BMI 26.45 kg/m   Physical Exam  ED Course  Reduction of fracture Date/Time: 03/26/2017 7:58 PM Performed by: Montine Circle Authorized by: Montine Circle  Consent: Verbal consent obtained. Written consent obtained. Risks and benefits: risks, benefits and alternatives were discussed Consent given by: patient Patient understanding: patient states understanding of the procedure being performed Patient consent: the patient's understanding of the procedure matches consent given Procedure consent: procedure consent matches procedure scheduled Relevant documents: relevant documents present and verified Test results: test results available and properly labeled Site marked: the operative site was marked Imaging studies: imaging studies available Required items: required blood products, implants, devices, and special equipment available Patient identity confirmed: verbally with patient Time out: Immediately prior to procedure a "time out" was called to verify the correct patient, procedure, equipment, support staff  and site/side marked as required. Preparation: Patient was prepped and draped in the usual sterile fashion. Local anesthesia used: no  Anesthesia: Local anesthesia used: no  Sedation: Patient sedated: yes (See Dr. Doreene Burke note) Vitals: Vital signs were  monitored during sedation. Patient tolerance: Patient tolerated the procedure well with no immediate complications    SPLINT APPLICATION Date/Time: 8:78 PM Authorized by: Montine Circle Consent: Verbal consent obtained. Risks and benefits: risks, benefits and alternatives were discussed Consent given by: patient Splint applied by: orthopedic technician Location details: right ankle Splint type: posterior and stirrup Supplies used: fiberglass and ace Post-procedure: The splinted body part was neurovascularly unchanged following the procedure. Patient tolerance: Patient tolerated the procedure well with no immediate complications.  SPLINT APPLICATION Date/Time: 6:76 PM Authorized by: Montine Circle Consent: Verbal consent obtained. Risks and benefits: risks, benefits and alternatives were discussed Consent given by: patient Splint applied by: orthopedic technician Location details: left ankle Splint type: posterior and stirrup Supplies used: fiberglass and ace Post-procedure: The splinted body part was neurovascularly unchanged following the procedure. Patient tolerance: Patient tolerated the procedure well with no immediate complications.    MDM Patient with bilateral lower extremity fractures.  Discussed with Dr. Percell Miller from orthopedics, who recommends reduction of left ankle in ED and admission to medicine.         Montine Circle, PA-C 03/26/17 2005    Gwenyth Allegra Tegeler, MD 03/28/17 2022

## 2017-03-26 NOTE — ED Notes (Signed)
Pt sitting up in bed eating turkey sandwich.

## 2017-03-26 NOTE — Progress Notes (Signed)
Orthopedic Tech Progress Note Patient Details:  Nichole Delgado 01/06/1951 [id number]  Ortho Devices Type of Ortho Device: Ace wrap, Short leg splint, Stirrup splint Ortho Device/Splint Location: left leg Ortho Device/Splint Interventions: Application   [name] 03/26/2017, 7:49 PM

## 2017-03-26 NOTE — ED Notes (Signed)
Patient transported to CT 

## 2017-03-26 NOTE — Sedation Documentation (Signed)
Portable xray at bedside.

## 2017-03-26 NOTE — ED Provider Notes (Signed)
[facility] DEPT Provider Note   CSN: [id number] Arrival date & time: 03/26/17  1352     History   Chief Complaint Chief Complaint  Patient presents with  . Fall    HPI Nichole Delgado is a 66 y.o. female who presents with pain after a fall. She was in her attic and stepped on a weak area on the floor and fell through to the garage which is ~46ft. She landed on her feet and then lost her balance and fell backwards and hit her head on a wall. She sustained a small scalp laceration from this. She then got down on the ground and was unable to get up. Currently she reports left ankle pain and right foot pain. She denies LOC, headache, N/V, neck pain, chest pain, SOB, abdominal pain, upper extremity pain, hip pain, knee pain. She is not on blood thinners.  HPI  Past Medical History:  Diagnosis Date  . Anxiety   . Vertigo 07/07/2016    Patient Active Problem List   Diagnosis Date Noted  . Vertigo 07/07/2016    Past Surgical History:  Procedure Laterality Date  . CATARACT EXTRACTION Bilateral   . FOOT SURGERY  2011  . PARTIAL HYSTERECTOMY  1988    OB History    No data available       Home Medications    Prior to Admission medications   Medication Sig Start Date End Date Taking? Authorizing Provider  ALPRAZolam Duanne Moron) 0.5 MG tablet Take 2 tablets approximately 45 minutes prior to the MRI study, take a third tablet if needed. 07/26/16   Kathrynn Ducking, MD  fluticasone Asencion Islam) 50 MCG/ACT nasal spray  02/25/16   Historical Provider, MD  ipratropium (ATROVENT) 0.03 % nasal spray Place 2 sprays into the nose 2 (two) times daily. 02/03/12 02/02/13  Chelle Jeffery, PA-C  meclizine (ANTIVERT) 25 MG tablet Take 25 mg by mouth. 02/25/16   Historical Provider, MD  meloxicam (MOBIC) 7.5 MG tablet Take 7.5 mg by mouth.    Historical Provider, MD  omeprazole (PRILOSEC) 40 MG capsule  05/19/16   Historical Provider, MD  pravastatin (PRAVACHOL) 40 MG tablet  06/15/16   Historical  Provider, MD  rizatriptan (MAXALT-MLT) 10 MG disintegrating tablet Take 1 tablet (10 mg total) by mouth 3 (three) times daily as needed for migraine. 08/09/16   Kathrynn Ducking, MD  thiothixene (NAVANE) 2 MG capsule Take by mouth.    Historical Provider, MD  traMADol (ULTRAM) 50 MG tablet Take 1 tablet (50 mg total) by mouth every 6 (six) hours as needed. 11/15/16   Kathrynn Ducking, MD  zolpidem (AMBIEN) 10 MG tablet Take 10 mg by mouth.    Historical Provider, MD    Family History Family History  Problem Relation Age of Onset  . Heart attack Father   . Migraines Mother   . Cancer Sister     Pancreatic  . Cancer Brother     Liver  . Migraines Daughter     Social History Social History  Substance Use Topics  . Smoking status: Current Every Day Smoker    Packs/day: 20.00    Years: 35.00  . Smokeless tobacco: Never Used  . Alcohol use No     Comment: 1 drink every few months     Allergies   Codeine; Penicillins; and Sulfa antibiotics   Review of Systems Review of Systems  Respiratory: Negative for shortness of breath.   Cardiovascular: Negative for chest pain.  Gastrointestinal: Negative  for abdominal pain, nausea and vomiting.  Genitourinary: Negative for pelvic pain.  Musculoskeletal: Positive for arthralgias, gait problem and joint swelling. Negative for back pain and neck pain.  Skin: Positive for wound.  Neurological: Negative for syncope and headaches.  All other systems reviewed and are negative.    Physical Exam Updated Vital Signs BP (!) 170/77 (BP Location: Left Arm)   Pulse 81   Temp 97.5 F (36.4 C) (Oral)   Resp 17   SpO2 95%   Physical Exam  Constitutional: She is oriented to person, place, and time. She appears well-developed and well-nourished. No distress.  HENT:  Head: Normocephalic and atraumatic.  Eyes: Conjunctivae are normal. Pupils are equal, round, and reactive to light. Right eye exhibits no discharge. Left eye exhibits no discharge.  No scleral icterus.  Neck: Normal range of motion.  No midline tenderness  Cardiovascular: Normal rate and regular rhythm.  Exam reveals no gallop and no friction rub.   No murmur heard. Pulmonary/Chest: Effort normal and breath sounds normal. No respiratory distress. She has no wheezes. She has no rales. She exhibits no tenderness.  Abdominal: Soft. Bowel sounds are normal. She exhibits no distension and no mass. There is no tenderness. There is no rebound and no guarding. No hernia.  No hip tenderness  Musculoskeletal:  Left ankle and foot: Moderate swelling over medial aspect of left ankle with tenderness. N/V intact. No calf tenderness. Decreased ROM due to pain. N/V intact.  Right ankle and foot: No obvious swelling or deformity. Tenderness to palpation of dorsal aspect of foot and heel. No calf tenderness. FROM of ankle and toes. N/V intact.    Neurological: She is alert and oriented to person, place, and time.  Lying on stretcher in NAD. GCS 15. Speaks in a clear voice. Cranial nerves II through XII grossly intact. 5/5 strength in upper extremities. Sensation fully intact.  Bilateral finger-to-nose intact.    Skin: Skin is warm and dry.  Dried blood over posterior right scalp. Unable to visualized laceration  Psychiatric: She has a normal mood and affect. Her behavior is normal.  Nursing note and vitals reviewed.    ED Treatments / Results  Labs (all labs ordered are listed, but only abnormal results are displayed) Labs Reviewed  BASIC METABOLIC PANEL  CBC WITH DIFFERENTIAL/PLATELET    EKG  EKG Interpretation None       Radiology No results found.  Procedures Procedures (including critical care time)  Medications Ordered in ED Medications  HYDROmorphone (DILAUDID) injection 1 mg (not administered)  HYDROmorphone (DILAUDID) injection 1 mg (1 mg Intravenous Given 03/26/17 1430)     Initial Impression / Assessment and Plan / ED Course  I have reviewed the triage  vital signs and the nursing notes.  Pertinent labs & imaging results that were available during my care of the patient were reviewed by me and considered in my medical decision making (see chart for details).  66 year old female presents with pain after a fall. Xrays and labs are pending. Pain controlled in ED. Patient signed out to [facility] PA-C pending results.   Final Clinical Impressions(s) / ED Diagnoses   Final diagnoses:  Fall, initial encounter    New Prescriptions New Prescriptions   No medications on file     Recardo Evangelist, PA-C 03/26/17 1555    Carmin Muskrat, MD 03/29/17 [phone]

## 2017-03-26 NOTE — ED Notes (Signed)
Consent form for procedural sedation signed

## 2017-03-26 NOTE — ED Triage Notes (Addendum)
PER EMS: pt arrives from home after falling about 10 ft from her attic that collapsed. She fell through and landed on her feet first onto concrete and then fell back and hit the back of her head on the wall, laceration noted and bleeding controlled. Denied LOC, A&OX4. Pt reports right foot and bilateral ankle pain. BP- 151/97, HR-84, 95% RA. 150 mcg IV fentanyl given en route. GCS 15. Pt arrives in c-collar due to mechanism of injury but denies neck pain.

## 2017-03-26 NOTE — ED Notes (Signed)
PA at bedside to assess patient.

## 2017-03-26 NOTE — H&P (Signed)
History and Physical    Nichole Delgado:[id number] DOB: May 31, 1951 DOA: 03/26/2017  Referring MD/NP/PA:   PCP: Gerrit Heck, MD   Patient coming from:  The patient is coming from home.  At baseline, pt is independent for most of ADL.   Chief Complaint: fall, injury to both ankle and right foot  HPI: Nichole Delgado is a 66 y.o. female with medical history significant of tobacco abuse, hyperlipidemia, GERD, vertigo, headache, IBS, mental illness (patient does not know the diagnosis, taking perphenazine), who presents with fall, injury to both ankles and right foot.  Pt states that she fell off from her attic that collapsed (about 10 ft high). She landed on her feet first onto concrete and then fell back and hit the back of her head on the wall. No LOC. No prodromal symptoms. She states that this is slowly an accident. She developed severe pain in both ankle and right heel. No headache or neck pain. The pain is constant, 8 out of 10 in severity, sharp, nonradiating. It is aggravated by movement. Patient denies chest pain, cough, shortness of breath. No GI symptoms. No symptoms of UTI. No leg numbness or unilateral weakness.  ED Course: pt was found to have  WBC 18.5, electrolytes renal function okay, temperature normal, no tachycardia, oxygen saturation 95% on room air. X-ray of ankle and right foot showed bilateral ankle fracture and right calcaneus fracture. X-ray of lumbar spin negative. CT-head and C spin negative for bony fracture, but incidentally showed a 8 mm nodule in left lung apex. Pt is admitted to med-surg bed as inpt. Ortho, Dr. Percell Miller was consulted-->planning surgery on left ankle tomorrow.   Review of Systems:   General: no fevers, chills, no changes in body weight, has fatigue HEENT: no blurry vision, hearing changes or sore throat Respiratory: no dyspnea, coughing, wheezing CV: no chest pain, no palpitations GI: no nausea, vomiting, abdominal pain,  diarrhea, constipation GU: no dysuria, burning on urination, increased urinary frequency, hematuria  Ext: no leg edema Neuro: no unilateral weakness, numbness, or tingling, no vision change or hearing loss Skin: no rash, no skin tear. MSK: pain in both ankles and left heel. Heme: No easy bruising.  Travel history: No recent long distant travel.  Allergy:  Allergies  Allergen Reactions  . Codeine Nausea Only  . Penicillins   . Sulfa Antibiotics Rash    Past Medical History:  Diagnosis Date  . Anxiety   . HLD (hyperlipidemia)   . IBS (irritable bowel syndrome)   . Tobacco abuse   . Vertigo 07/07/2016    Past Surgical History:  Procedure Laterality Date  . CATARACT EXTRACTION Bilateral   . FOOT SURGERY  2011  . PARTIAL HYSTERECTOMY  1988    Social History:  reports that she has been smoking.  She has a 700.00 pack-year smoking history. She has never used smokeless tobacco. She reports that she does not drink alcohol or use drugs.  Family History:  Family History  Problem Relation Age of Onset  . Heart attack Father   . Migraines Mother   . Cancer Sister     Pancreatic  . Cancer Brother     Liver  . Migraines Daughter      Prior to Admission medications   Medication Sig Start Date End Date Taking? Authorizing Provider  Eluxadoline (VIBERZI) 75 MG TABS Take 75 mg by mouth 2 (two) times daily.   Yes Historical Provider, MD  fluticasone (FLONASE) 50 MCG/ACT nasal spray Place  2 sprays into both nostrils daily.  02/25/16  Yes Historical Provider, MD  meclizine (ANTIVERT) 25 MG tablet Take 25 mg by mouth daily as needed.  02/25/16  Yes Historical Provider, MD  meloxicam (MOBIC) 7.5 MG tablet Take 7.5 mg by mouth daily.    Yes Historical Provider, MD  omeprazole (PRILOSEC) 40 MG capsule Take 40 mg by mouth daily as needed (indigestion).  05/19/16  Yes Historical Provider, MD  perphenazine (TRILAFON) 4 MG tablet Take 4 mg by mouth at bedtime.   Yes Historical Provider, MD    pravastatin (PRAVACHOL) 40 MG tablet Take 40 mg by mouth at bedtime.  06/15/16  Yes Historical Provider, MD  SUMAtriptan Succinate (IMITREX PO) Take by mouth daily as needed.   Yes Historical Provider, MD  traMADol (ULTRAM) 50 MG tablet Take 1 tablet (50 mg total) by mouth every 6 (six) hours as needed. 11/15/16  Yes Kathrynn Ducking, MD  zolpidem (AMBIEN) 10 MG tablet Take 10 mg by mouth at bedtime as needed for sleep.    Yes Historical Provider, MD  ALPRAZolam Duanne Moron) 0.5 MG tablet Take 2 tablets approximately 45 minutes prior to the MRI study, take a third tablet if needed. Patient not taking: Reported on 03/26/2017 07/26/16   Kathrynn Ducking, MD  ipratropium (ATROVENT) 0.03 % nasal spray Place 2 sprays into the nose 2 (two) times daily. 02/03/12 02/02/13  Chelle Jeffery, PA-C  rizatriptan (MAXALT-MLT) 10 MG disintegrating tablet Take 1 tablet (10 mg total) by mouth 3 (three) times daily as needed for migraine. Patient not taking: Reported on 03/26/2017 08/09/16   Kathrynn Ducking, MD    Physical Exam: Vitals:   03/26/17 1945 03/26/17 1950 03/26/17 1955 03/26/17 2000  BP: (!) 126/56 139/62 (!) 158/66 (!) 144/65  Pulse: 85 86 85 85  Resp: 14 17 19 19   Temp:      TempSrc:      SpO2: 97% 98% 99% 99%  Weight:       General: Not in acute distress HEENT:       Eyes: PERRL, EOMI, no scleral icterus.       ENT: No discharge from the ears and nose, no pharynx injection, no tonsillar enlargement.        Neck: No JVD, no bruit, no mass felt. Heme: No neck lymph node enlargement. Cardiac: S1/S2, RRR, No murmurs, No gallops or rubs. Respiratory: No rales, wheezing, rhonchi or rubs. GI: Soft, nondistended, nontender, no rebound pain, no organomegaly, BS present. GU: No hematuria Ext: No pitting leg edema bilaterally. 2+DP/PT pulse bilaterally. Musculoskeletal: has tenderness in both ankle and right heel. Splint to both ankle and Stirrup splint to right heel are applied. Skin: No rashes.  Neuro:  Alert, oriented X3, cranial nerves II-XII grossly intact, moves all extremities normally.  Psych: Patient is not psychotic, no suicidal or hemocidal ideation.  Labs on Admission: I have personally reviewed following labs and imaging studies  CBC:  Recent Labs Lab 03/26/17 1559  WBC 18.5*  NEUTROABS 15.3*  HGB 15.5*  HCT 45.7  MCV 90.3  PLT 546   Basic Metabolic Panel:  Recent Labs Lab 03/26/17 1559  NA 139  K 4.2  CL 105  CO2 23  GLUCOSE 106*  BUN 14  CREATININE 0.89  CALCIUM 9.5   GFR: Estimated Creatinine Clearance: 48.1 mL/min (by C-G formula based on SCr of 0.89 mg/dL). Liver Function Tests: No results for input(s): AST, ALT, ALKPHOS, BILITOT, PROT, ALBUMIN in the last 168 hours. No  results for input(s): LIPASE, AMYLASE in the last 168 hours. No results for input(s): AMMONIA in the last 168 hours. Coagulation Profile: No results for input(s): INR, PROTIME in the last 168 hours. Cardiac Enzymes: No results for input(s): CKTOTAL, CKMB, CKMBINDEX, TROPONINI in the last 168 hours. BNP (last 3 results) No results for input(s): PROBNP in the last 8760 hours. HbA1C: No results for input(s): HGBA1C in the last 72 hours. CBG: No results for input(s): GLUCAP in the last 168 hours. Lipid Profile: No results for input(s): CHOL, HDL, LDLCALC, TRIG, CHOLHDL, LDLDIRECT in the last 72 hours. Thyroid Function Tests: No results for input(s): TSH, T4TOTAL, FREET4, T3FREE, THYROIDAB in the last 72 hours. Anemia Panel: No results for input(s): VITAMINB12, FOLATE, FERRITIN, TIBC, IRON, RETICCTPCT in the last 72 hours. Urine analysis: No results found for: COLORURINE, APPEARANCEUR, LABSPEC, PHURINE, GLUCOSEU, HGBUR, BILIRUBINUR, KETONESUR, PROTEINUR, UROBILINOGEN, NITRITE, LEUKOCYTESUR Sepsis Labs: @LABRCNTIP (procalcitonin:4,lacticidven:4) )No results found for this or any previous visit (from the past 240 hour(s)).   Radiological Exams on Admission: Dg Lumbar Spine  Complete  Result Date: 03/26/2017 CLINICAL DATA:  Pt fell from attic at 1 pm today. Pt c/o right heel and lateral ankle pain, left ankle pain with medial bruising and swelling, and lower back pain. Only previous sx is to right great toe. EXAM: LUMBAR SPINE - COMPLETE 4+ VIEW COMPARISON:  None. FINDINGS: Minimal scoliosis which may be positional in nature. Alignment is otherwise normal. No evidence of acute vertebral body subluxation. No fracture line or displaced fracture fragment seen. No compression fracture deformity. Minimal degenerative spurring within the upper and mid lumbar spine. No evidence of advanced degenerative change at any level. Facet joints appear intact and normally aligned. Upper sacrum appears intact and normally aligned. Paravertebral soft tissues are unremarkable. IMPRESSION: No acute findings. No fracture or acute subluxation within the lumbar spine. Electronically Signed   By: Franki Cabot M.D.   On: 03/26/2017 16:09   Dg Ankle Complete Left  Result Date: 03/26/2017 CLINICAL DATA:  Insert Clinic EXAM: LEFT ANKLE COMPLETE - 3+ VIEW COMPARISON:  None. FINDINGS: Displaced fracture within the medial malleolus, with lateral displacement of the fractured medial malleolus, and medial displacement of the distal tibia relative to the underlying talus. Associated distortion of the ankle mortise. Additional displaced fracture within the upper portion of the lateral malleolus. O fracture seen within the talar dome. No fracture seen within the visualized portions of the hindfoot or midfoot. IMPRESSION: 1. Displaced fracture within the medial malleolus. Associated medial displacement of the distal tibia relative to the underlying talus. Associated distortion of the ankle mortise. 2. Additional displaced fracture within the distal fibula. Electronically Signed   By: Franki Cabot M.D.   On: 03/26/2017 16:08   Dg Ankle Complete Right  Result Date: 03/26/2017 CLINICAL DATA:  Pt fell from attic at 1  pm today. Pt c/o right heel and lateral ankle pain, left ankle pain with medial bruising and swelling, and lower back pain. Only previous sx is to right great toe. EXAM: RIGHT ANKLE - COMPLETE 3+ VIEW COMPARISON:  None. FINDINGS: Displaced/comminuted fracture within the distal right fibula. Distal tibia appears intact and normally aligned. Ankle mortise is symmetric. Displaced/comminuted fractures within the posterior calcaneus. Probable associated diastases at the overlying subtalar joint spaces. IMPRESSION: 1. Displaced/comminuted fracture within the distal right fibula. 2. Displaced/comminuted fractures within the posterior calcaneus. Probable associated diastases at the overlying subtalar joint spaces. Consider CT for more definitive characterization of fracture extent and osseous alignment. Electronically Signed  By: Franki Cabot M.D.   On: 03/26/2017 16:05   Ct Head Wo Contrast  Result Date: 03/26/2017 CLINICAL DATA:  Status post fall. EXAM: CT HEAD WITHOUT CONTRAST CT CERVICAL SPINE WITHOUT CONTRAST TECHNIQUE: Multidetector CT imaging of the head and cervical spine was performed following the standard protocol without intravenous contrast. Multiplanar CT image reconstructions of the cervical spine were also generated. COMPARISON:  None. FINDINGS: CT HEAD FINDINGS Brain: Ventricles are normal in size and configuration. There is no hemorrhage, edema or other evidence of acute parenchymal abnormality. No extra-axial hemorrhage. Vascular: No hyperdense vessel or unexpected calcification. Skull: Normal. Negative for fracture or focal lesion. Sinuses/Orbits: No acute finding. Other: Soft tissue edema/laceration overlying the posterior left parietal-occipital bone. No underlying fracture. Slight deformity of the left nasal bone, likely chronic. CT CERVICAL SPINE FINDINGS Alignment: Mild straightening of the normal cervical lordosis related to underlying degenerative change. No evidence of acute vertebral body  subluxation. Skull base and vertebrae: No fracture line or displaced fracture fragment identified. Facet joints appear intact and normally aligned throughout. Soft tissues and spinal canal: No prevertebral fluid or swelling. No visible canal hematoma. Disc levels: Mild degenerative spurring throughout the mid and lower cervical spine, with associated mild disc-osteophytic bulges at the C3-4 through C6-7 levels. No more than mild central canal stenosis at any level. Upper chest: Nodular consolidation within the medial aspects of the left lung apex, measuring 8 mm (series 302, image 90), possibly a semi-solid nodule, too small to definitively characterize. Other: Subcentimeter hypodense focus within the right thyroid lobe, most likely a benign colloid nodule. Carotid atherosclerosis. IMPRESSION: 1. Soft tissue edema/laceration overlying the left posterior parietal-occipital bone. No underlying fracture. 2. No acute intracranial abnormality. No intracranial hemorrhage or edema. 3. No fracture or acute subluxation within the cervical spine. Degenerative changes of the cervical spine, as detailed above. 4. **An incidental finding of potential clinical significance has been found. Nodular consolidation within the left lung apex, measuring 8 mm, possibly semi-solid nodule, alternatively chronic scarring or atelectasis. Follow-up non-contrast CT recommended at 3-6 months to confirm persistence. If unchanged, and solid component remains <6 mm, annual CT is recommended until 5 years of stability has been established. If persistent these nodules should be considered highly suspicious if the solid component of the nodule is 6 mm or greater in size and enlarging. This recommendation follows the consensus statement: Guidelines for Management of Incidental Pulmonary Nodules Detected on CT Images: From the Fleischner Society 2017; Radiology 2017; 284:228-243.** 5. Carotid atherosclerosis. Electronically Signed   By: Franki Cabot  M.D.   On: 03/26/2017 17:28   Ct Cervical Spine Wo Contrast  Result Date: 03/26/2017 CLINICAL DATA:  Status post fall. EXAM: CT HEAD WITHOUT CONTRAST CT CERVICAL SPINE WITHOUT CONTRAST TECHNIQUE: Multidetector CT imaging of the head and cervical spine was performed following the standard protocol without intravenous contrast. Multiplanar CT image reconstructions of the cervical spine were also generated. COMPARISON:  None. FINDINGS: CT HEAD FINDINGS Brain: Ventricles are normal in size and configuration. There is no hemorrhage, edema or other evidence of acute parenchymal abnormality. No extra-axial hemorrhage. Vascular: No hyperdense vessel or unexpected calcification. Skull: Normal. Negative for fracture or focal lesion. Sinuses/Orbits: No acute finding. Other: Soft tissue edema/laceration overlying the posterior left parietal-occipital bone. No underlying fracture. Slight deformity of the left nasal bone, likely chronic. CT CERVICAL SPINE FINDINGS Alignment: Mild straightening of the normal cervical lordosis related to underlying degenerative change. No evidence of acute vertebral body subluxation. Skull base and  vertebrae: No fracture line or displaced fracture fragment identified. Facet joints appear intact and normally aligned throughout. Soft tissues and spinal canal: No prevertebral fluid or swelling. No visible canal hematoma. Disc levels: Mild degenerative spurring throughout the mid and lower cervical spine, with associated mild disc-osteophytic bulges at the C3-4 through C6-7 levels. No more than mild central canal stenosis at any level. Upper chest: Nodular consolidation within the medial aspects of the left lung apex, measuring 8 mm (series 302, image 90), possibly a semi-solid nodule, too small to definitively characterize. Other: Subcentimeter hypodense focus within the right thyroid lobe, most likely a benign colloid nodule. Carotid atherosclerosis. IMPRESSION: 1. Soft tissue edema/laceration  overlying the left posterior parietal-occipital bone. No underlying fracture. 2. No acute intracranial abnormality. No intracranial hemorrhage or edema. 3. No fracture or acute subluxation within the cervical spine. Degenerative changes of the cervical spine, as detailed above. 4. **An incidental finding of potential clinical significance has been found. Nodular consolidation within the left lung apex, measuring 8 mm, possibly semi-solid nodule, alternatively chronic scarring or atelectasis. Follow-up non-contrast CT recommended at 3-6 months to confirm persistence. If unchanged, and solid component remains <6 mm, annual CT is recommended until 5 years of stability has been established. If persistent these nodules should be considered highly suspicious if the solid component of the nodule is 6 mm or greater in size and enlarging. This recommendation follows the consensus statement: Guidelines for Management of Incidental Pulmonary Nodules Detected on CT Images: From the Fleischner Society 2017; Radiology 2017; 284:228-243.** 5. Carotid atherosclerosis. Electronically Signed   By: Franki Cabot M.D.   On: 03/26/2017 17:28   Ct Ankle Right Wo Contrast  Result Date: 03/26/2017 CLINICAL DATA:  Right ankle fracture after falling through attic floor landing on both feet. EXAM: CT OF THE RIGHT ANKLE WITHOUT CONTRAST TECHNIQUE: Multidetector CT imaging of the right ankle was performed according to the standard protocol. Multiplanar CT image reconstructions were also generated. COMPARISON:  03/26/2017 ankle radiographs FINDINGS: Bones/Joint/Cartilage 1. Closed, comminuted distal fibular diaphyseal fracture 2.5 cm from the ankle mortise. Slight lateral angulation of the main distal fracture fragment with buckling and 3 mm of lateral displacement of a small butterfly fracture fragment. 2. 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 No encroachment on the sinus tarsi. 3. Fracture along the long axis of the calcaneus is noted on the sagittal reformats through the body and posterior calcaneus with a coronal fracture fragment extending to the plantar surface off this fracture (series 201, image 17) and a second extending through the posterior calcaneus (series 204, image 24). 3 mm of offset is noted at the posterior subtalar joint (series 204, image 20). 4. Tiny ossific density off the plantar dorsal aspect of the cuboid may represent a small avulsion, age indeterminate given its slightly rounded margins. 5. The ankle mortise is maintained. No distal tibial fracture is seen. The midfoot articulations are appear congruent. Ligaments Suboptimally assessed by CT. Muscles and Tendons Intact peroneal tendons. Intact Achilles. Intact extensor tendons crossing the ankle joint. Intact tibialis posterior and flexor digitorum longus tendons. The flexor hallucis is adjacent to one of the fracture lines medially and there is potential for entrapment, series 202, image 64. Soft tissues Diffuse soft tissue swelling along the dorsal plantar aspect of the foot. IMPRESSION: 1. Acute, closed, distal  fibular diaphyseal fracture. 2. 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 3. Age-indeterminate tiny ossific density off the plantar dorsal aspect of the cuboid. Electronically Signed   By: Ashley Royalty M.D.   On: 03/26/2017 18:08   Dg Ankle Left Port  Result Date: 03/26/2017 CLINICAL DATA:  Status post reduction of left ankle fracture. Initial encounter. EXAM: PORTABLE LEFT ANKLE - 2 VIEW COMPARISON:  Left ankle radiographs performed earlier today at 3:08 p.m. FINDINGS: There is improved alignment of the medial malleolar fracture. A  minimally displaced distal fibular fracture is again noted. There is mild residual lateral tilt of the talus, with mild widening of the medial aspect of the ankle mortise. Mild surrounding soft tissue swelling is noted. A splint is noted about the lower leg and ankle. IMPRESSION: Improved alignment of the medial malleolar fracture. Minimally displaced distal fibular fracture again noted. Mild residual lateral tilt of the talus, with mild widening of the medial aspect of the ankle mortise. Electronically Signed   By: Garald Balding M.D.   On: 03/26/2017 20:04   Dg Foot Complete Left  Result Date: 03/26/2017 CLINICAL DATA:  [facility] EXAM: LEFT FOOT - COMPLETE 3+ VIEW COMPARISON:  None. FINDINGS: No fracture line or displaced fracture fragment seen within the hindfoot, midfoot or forefoot. Displaced fractures of the medial malleolus and lateral malleolus are better demonstrated on the dedicated ankle exam. IMPRESSION: Negative left foot. Electronically Signed   By: Franki Cabot M.D.   On: 03/26/2017 16:10   Dg Foot Complete Right  Result Date: 03/26/2017 CLINICAL DATA:  Pt fell from attic at 1 pm today. Pt c/o right heel and lateral ankle pain, left ankle pain with medial bruising and swelling, and lower back pain. Only previous sx is to right great toe. EXAM: RIGHT FOOT COMPLETE - 3+ VIEW COMPARISON:  None. FINDINGS: Displaced/comminuted fractures within the posterior calcaneus. Probable associated diastases at the overlying subtalar joint spaces. IMPRESSION: Displaced/comminuted fractures within the posterior calcaneus. Probable associated diastases at the overlying subtalar joint spaces. Consider CT for more definitive characterization of fracture extent and osseous alignment. Electronically Signed   By: Franki Cabot M.D.   On: 03/26/2017 16:04     EKG: Not done in ED, will get one.   Assessment/Plan Active Problems:   Vertigo   HLD (hyperlipidemia)   Fall   Tobacco abuse   Right calcaneal  fracture   Closed fracture of both ankles   IBS (irritable bowel syndrome)   GERD (gastroesophageal reflux disease)  Fracture of bilateral ankle, and right calcaneal bone: As evidenced by x-ray. Patient has severe pain now. No neurovascular compromise. Orthopedic surgeon was consulted. Dr. Percell Miller is planning to do surgery on left ankle tomorrow.  - will admit to Med-surg bed as inpt - Pain control: morphine prn and percocet - When necessary Zofran for nausea - Robaxin for muscle spasm - Appreciated Dr. consultation - type and cross - INR/PTT - consult to CM and SW  Leukocytosis: Likely due to stress-induced demargination. Patient does not have signs of infection. -Follow-up CBC  Vertigo: -Continue when necessary meclizine  HLD (hyperlipidemia): -Continue pravastatin  IBS: No nausea, vomiting, abdominal pain or diarrhea -Continue home [facility]  Fall: due to accident. No prodromal symptoms. -PT/OT when able to  GERD: -Protonix  Lung nodule: An incidental finding, Nodular consolidation within the left lung apex, measuring  8 mm, possibly semi-solid nodule, alternatively chronic scarring or atelectasis.  -need to follow-up with PCP to get non-contrast CT at 3-6 months to confirm persistence.  DVT ppx: SCD Code Status: Full code Family Communication:  Yes, patient's husband and daughter at bed side Disposition Plan:  Anticipate discharge back to previous home environment Consults called: ortho, Dr. Percell Miller  Admission status: medical floor/inpt  Date of Service 03/26/2017    Ivor Costa [facility] Pager [phone]  If 7PM-7AM, please contact night-coverage [email or web address] Password [email or web address] 03/26/2017, 9:12 PM

## 2017-03-26 NOTE — ED Notes (Signed)
Patient transported to X-ray 

## 2017-03-26 NOTE — Sedation Documentation (Signed)
Splint applied by ortho. Portable xray called

## 2017-03-26 NOTE — ED Notes (Signed)
ETCO2 in use, 2L [place] placed on patient. Pt being monitored by 5 lead cardiac monitor, BP cuff and continuous pulse ox.

## 2017-03-26 NOTE — Progress Notes (Signed)
Orthopedic Tech Progress Note Patient Details:  Nichole Delgado [birth year] [id number]  Ortho Devices Type of Ortho Device: Ace wrap, Post (short leg) splint, Stirrup splint Ortho Device/Splint Interventions: Application   [name] 03/26/2017, 7:30 PM

## 2017-03-26 NOTE — Sedation Documentation (Signed)
PA reducing ankle, ortho applying splint

## 2017-03-26 NOTE — ED Notes (Signed)
100 mg, which was 73ml of propofol wasted in sink with Lenon Oms, RN

## 2017-03-27 ENCOUNTER — Inpatient Hospital Stay (HOSPITAL_COMMUNITY): Payer: Medicare Other | Admitting: Anesthesiology

## 2017-03-27 ENCOUNTER — Encounter (HOSPITAL_COMMUNITY)
Admission: EM | Disposition: A | Discharge status: Skilled Nursing Facility | Payer: Self-pay | Source: Home / Self Care | Attending: Internal Medicine

## 2017-03-27 DIAGNOSIS — W19XXXA Unspecified fall, initial encounter: Secondary | ICD-10-CM

## 2017-03-27 DIAGNOSIS — S82892A Other fracture of left lower leg, initial encounter for closed fracture: Secondary | ICD-10-CM

## 2017-03-27 DIAGNOSIS — S82891A Other fracture of right lower leg, initial encounter for closed fracture: Secondary | ICD-10-CM

## 2017-03-27 HISTORY — PX: ORIF ANKLE FRACTURE: SHX5408

## 2017-03-27 LAB — URINALYSIS, ROUTINE W REFLEX MICROSCOPIC
Bilirubin Urine: NEGATIVE
Glucose, UA: NEGATIVE mg/dL
Hgb urine dipstick: NEGATIVE
KETONES UR: NEGATIVE mg/dL
LEUKOCYTES UA: NEGATIVE
NITRITE: NEGATIVE
PH: 5 (ref 5.0–8.0)
PROTEIN: NEGATIVE mg/dL
Specific Gravity, Urine: 1.014 (ref 1.005–1.030)

## 2017-03-27 LAB — CBC
HEMATOCRIT: 40.6 % (ref 36.0–46.0)
Hemoglobin: 13.5 g/dL (ref 12.0–15.0)
MCH: 30.2 pg (ref 26.0–34.0)
MCHC: 33.3 g/dL (ref 30.0–36.0)
MCV: 90.8 fL (ref 78.0–100.0)
PLATELETS: 224 10*3/uL (ref 150–400)
RBC: 4.47 MIL/uL (ref 3.87–5.11)
RDW: 13.2 % (ref 11.5–15.5)
WBC: 11.1 10*3/uL — ABNORMAL HIGH (ref 4.0–10.5)

## 2017-03-27 LAB — BASIC METABOLIC PANEL
Anion gap: 6 (ref 5–15)
BUN: 13 mg/dL (ref 6–20)
CALCIUM: 8.5 mg/dL — AB (ref 8.9–10.3)
CO2: 25 mmol/L (ref 22–32)
CREATININE: 0.98 mg/dL (ref 0.44–1.00)
Chloride: 109 mmol/L (ref 101–111)
GFR, EST NON AFRICAN AMERICAN: 58 mL/min — AB (ref >60)
Glucose, Bld: 149 mg/dL — ABNORMAL HIGH (ref 65–99)
Potassium: 3.7 mmol/L (ref 3.5–5.1)
Sodium: 140 mmol/L (ref 135–145)

## 2017-03-27 LAB — ABO/RH: ABO/RH(D): B NEG

## 2017-03-27 LAB — MRSA PCR SCREENING: MRSA by PCR: NEGATIVE

## 2017-03-27 SURGERY — OPEN REDUCTION INTERNAL FIXATION (ORIF) ANKLE FRACTURE
Anesthesia: Regional | Site: Ankle | Laterality: Left

## 2017-03-27 MED ORDER — SENNA 8.6 MG PO TABS
1.0000 | ORAL_TABLET | Freq: Two times a day (BID) | ORAL | Status: DC
  Administered 2017-03-27 – 2017-03-28 (×3): 8.6 mg via ORAL
  Filled 2017-03-27 (×6): qty 1

## 2017-03-27 MED ORDER — ACETAMINOPHEN 325 MG PO TABS
650.0000 mg | ORAL_TABLET | Freq: Four times a day (QID) | ORAL | Status: AC
  Administered 2017-03-27 – 2017-03-28 (×4): 650 mg via ORAL
  Filled 2017-03-27 (×4): qty 2

## 2017-03-27 MED ORDER — DIPHENHYDRAMINE HCL 12.5 MG/5ML PO ELIX: 12.5000 mg | ORAL_SOLUTION | ORAL | Status: DC | PRN

## 2017-03-27 MED ORDER — METOCLOPRAMIDE HCL 5 MG/ML IJ SOLN: 5.0000 mg | Freq: Three times a day (TID) | INTRAMUSCULAR | Status: DC | PRN

## 2017-03-27 MED ORDER — FENTANYL CITRATE (PF) 100 MCG/2ML IJ SOLN
INTRAMUSCULAR | Status: AC
  Filled 2017-03-27: qty 2

## 2017-03-27 MED ORDER — KETOROLAC TROMETHAMINE 15 MG/ML IJ SOLN
7.5000 mg | Freq: Four times a day (QID) | INTRAMUSCULAR | Status: AC
  Administered 2017-03-27 – 2017-03-28 (×4): 7.5 mg via INTRAVENOUS
  Filled 2017-03-27 (×3): qty 1

## 2017-03-27 MED ORDER — CEFAZOLIN SODIUM-DEXTROSE 2-4 GM/100ML-% IV SOLN: 2.0000 g | INTRAVENOUS | Status: DC

## 2017-03-27 MED ORDER — PROPOFOL 500 MG/50ML IV EMUL
INTRAVENOUS | Status: AC
  Filled 2017-03-27: qty 100

## 2017-03-27 MED ORDER — EPHEDRINE SULFATE 50 MG/ML IJ SOLN
INTRAMUSCULAR | Status: DC | PRN
  Administered 2017-03-27: 10 mg via INTRAVENOUS
  Administered 2017-03-27 (×3): 5 mg via INTRAVENOUS

## 2017-03-27 MED ORDER — GABAPENTIN 300 MG PO CAPS
300.0000 mg | ORAL_CAPSULE | Freq: Once | ORAL | Status: AC
  Administered 2017-03-27: 300 mg via ORAL

## 2017-03-27 MED ORDER — 0.9 % SODIUM CHLORIDE (POUR BTL) OPTIME
TOPICAL | Status: DC | PRN
Start: 2017-03-27 — End: 2017-03-27
  Administered 2017-03-27: 1000 mL

## 2017-03-27 MED ORDER — METOCLOPRAMIDE HCL 5 MG PO TABS: 5.0000 mg | ORAL_TABLET | Freq: Three times a day (TID) | ORAL | Status: DC | PRN

## 2017-03-27 MED ORDER — POLYETHYLENE GLYCOL 3350 17 G PO PACK: 17.0000 g | PACK | Freq: Every day | ORAL | Status: DC | PRN

## 2017-03-27 MED ORDER — ONDANSETRON HCL 4 MG/2ML IJ SOLN
INTRAMUSCULAR | Status: DC | PRN
  Administered 2017-03-27: 4 mg via INTRAVENOUS

## 2017-03-27 MED ORDER — MIDAZOLAM HCL 2 MG/2ML IJ SOLN
INTRAMUSCULAR | Status: AC
  Filled 2017-03-27: qty 2

## 2017-03-27 MED ORDER — DOCUSATE SODIUM 100 MG PO CAPS
100.0000 mg | ORAL_CAPSULE | Freq: Two times a day (BID) | ORAL | Status: DC
  Administered 2017-03-27 – 2017-03-30 (×7): 100 mg via ORAL
  Filled 2017-03-27 (×7): qty 1

## 2017-03-27 MED ORDER — KETOROLAC TROMETHAMINE 15 MG/ML IJ SOLN
INTRAMUSCULAR | Status: AC
  Administered 2017-03-27: 7.5 mg via INTRAVENOUS
  Filled 2017-03-27: qty 1

## 2017-03-27 MED ORDER — LACTATED RINGERS IV SOLN: INTRAVENOUS | Status: DC

## 2017-03-27 MED ORDER — MIDAZOLAM HCL 2 MG/2ML IJ SOLN
2.0000 mg | Freq: Once | INTRAMUSCULAR | Status: AC
  Administered 2017-03-27: 2 mg via INTRAVENOUS

## 2017-03-27 MED ORDER — PROPOFOL 10 MG/ML IV BOLUS
INTRAVENOUS | Status: AC
  Filled 2017-03-27: qty 20

## 2017-03-27 MED ORDER — LACTATED RINGERS IV SOLN
INTRAVENOUS | Status: DC | PRN
  Administered 2017-03-27 (×2): via INTRAVENOUS

## 2017-03-27 MED ORDER — CHLORHEXIDINE GLUCONATE 4 % EX LIQD: 60.0000 mL | Freq: Once | CUTANEOUS | Status: DC

## 2017-03-27 MED ORDER — LIDOCAINE HCL (CARDIAC) 20 MG/ML IV SOLN
INTRAVENOUS | Status: DC | PRN
  Administered 2017-03-27: 2 mL via INTRATRACHEAL

## 2017-03-27 MED ORDER — ACETAMINOPHEN 500 MG PO TABS
ORAL_TABLET | ORAL | Status: AC
  Filled 2017-03-27: qty 2

## 2017-03-27 MED ORDER — FENTANYL CITRATE (PF) 100 MCG/2ML IJ SOLN
100.0000 ug | Freq: Once | INTRAMUSCULAR | Status: AC
  Administered 2017-03-27: 100 ug via INTRAVENOUS

## 2017-03-27 MED ORDER — OXYCODONE-ACETAMINOPHEN 5-325 MG PO TABS: 1.0000 | ORAL_TABLET | ORAL | 0 refills | Status: DC | PRN

## 2017-03-27 MED ORDER — ENOXAPARIN SODIUM 40 MG/0.4ML ~~LOC~~ SOLN
40.0000 mg | SUBCUTANEOUS | Status: DC
  Administered 2017-03-28 – 2017-03-30 (×3): 40 mg via SUBCUTANEOUS
  Filled 2017-03-27 (×3): qty 0.4

## 2017-03-27 MED ORDER — MORPHINE SULFATE (PF) 2 MG/ML IV SOLN
2.0000 mg | INTRAVENOUS | Status: DC | PRN
  Administered 2017-03-27: 2 mg via INTRAVENOUS
  Filled 2017-03-27: qty 1

## 2017-03-27 MED ORDER — BACLOFEN 10 MG PO TABS: 10.0000 mg | ORAL_TABLET | Freq: Three times a day (TID) | ORAL | 0 refills | Status: DC | PRN

## 2017-03-27 MED ORDER — CEFAZOLIN IN D5W 1 GM/50ML IV SOLN
1.0000 g | Freq: Four times a day (QID) | INTRAVENOUS | Status: AC
Start: 2017-03-27 — End: 2017-03-28
  Administered 2017-03-27 – 2017-03-28 (×3): 1 g via INTRAVENOUS
  Filled 2017-03-27 (×3): qty 50

## 2017-03-27 MED ORDER — PROPOFOL 10 MG/ML IV BOLUS
INTRAVENOUS | Status: DC | PRN
  Administered 2017-03-27: 150 mg via INTRAVENOUS
  Administered 2017-03-27: 50 mg via INTRAVENOUS

## 2017-03-27 MED ORDER — ENOXAPARIN SODIUM 40 MG/0.4ML ~~LOC~~ SOLN: 40.0000 mg | SUBCUTANEOUS | 0 refills | Status: DC

## 2017-03-27 MED ORDER — GABAPENTIN 300 MG PO CAPS
ORAL_CAPSULE | ORAL | Status: AC
  Filled 2017-03-27: qty 1

## 2017-03-27 MED ORDER — OXYCODONE HCL 5 MG PO TABS
5.0000 mg | ORAL_TABLET | ORAL | Status: DC | PRN
  Administered 2017-03-27 (×2): 5 mg via ORAL
  Administered 2017-03-28 – 2017-03-30 (×11): 10 mg via ORAL
  Filled 2017-03-27 (×10): qty 2
  Filled 2017-03-27: qty 1
  Filled 2017-03-27: qty 2
  Filled 2017-03-27: qty 1

## 2017-03-27 MED ORDER — PHENYLEPHRINE HCL 10 MG/ML IJ SOLN
INTRAMUSCULAR | Status: DC | PRN
  Administered 2017-03-27: 40 ug via INTRAVENOUS

## 2017-03-27 MED ORDER — LACTATED RINGERS IV SOLN
INTRAVENOUS | Status: DC
  Administered 2017-03-27: 15:00:00 via INTRAVENOUS

## 2017-03-27 MED ORDER — ACETAMINOPHEN 500 MG PO TABS
1000.0000 mg | ORAL_TABLET | Freq: Once | ORAL | Status: AC
  Administered 2017-03-27: 1000 mg via ORAL

## 2017-03-27 MED ORDER — ONDANSETRON HCL 4 MG PO TABS: 4.0000 mg | ORAL_TABLET | Freq: Three times a day (TID) | ORAL | 0 refills | Status: DC | PRN

## 2017-03-27 MED ORDER — SORBITOL 70 % SOLN: 30.0000 mL | Freq: Every day | Status: DC | PRN

## 2017-03-27 MED ORDER — MAGNESIUM CITRATE PO SOLN: 1.0000 | Freq: Once | ORAL | Status: DC | PRN

## 2017-03-27 SURGICAL SUPPLY — 72 items
BANDAGE ACE 4X5 VEL STRL LF (GAUZE/BANDAGES/DRESSINGS) ×2 IMPLANT
BANDAGE ACE 6X5 VEL STRL LF (GAUZE/BANDAGES/DRESSINGS) ×2 IMPLANT
BANDAGE ESMARK 6X9 LF (GAUZE/BANDAGES/DRESSINGS) ×1 IMPLANT
BIT DRILL 2.5X125 (BIT) ×2 IMPLANT
BIT DRILL 3.5X125 (BIT) ×1 IMPLANT
BIT DRILL CANN 2.7 (BIT) ×1
BIT DRILL SRG 2.7XCANN AO CPLG (BIT) ×1 IMPLANT
BIT DRL SRG 2.7XCANN AO CPLNG (BIT) ×1
BNDG ESMARK 6X9 LF (GAUZE/BANDAGES/DRESSINGS) ×2
CANISTER SUCT 3000ML PPV (MISCELLANEOUS) ×2 IMPLANT
COVER SURGICAL LIGHT HANDLE (MISCELLANEOUS) ×2 IMPLANT
CUFF TOURNIQUET SINGLE 34IN LL (TOURNIQUET CUFF) ×2 IMPLANT
DRAPE C-ARM 42X72 X-RAY (DRAPES) IMPLANT
DRAPE C-ARMOR (DRAPES) IMPLANT
DRAPE OEC MINIVIEW 54X84 (DRAPES) ×2 IMPLANT
DRAPE ORTHO SPLIT 77X108 STRL (DRAPES)
DRAPE SURG ORHT 6 SPLT 77X108 (DRAPES) IMPLANT
DRAPE U-SHAPE 47X51 STRL (DRAPES) IMPLANT
DRILL BIT 3.5X125 (BIT) ×1
DRSG ADAPTIC 3X8 NADH LF (GAUZE/BANDAGES/DRESSINGS) ×2 IMPLANT
DRSG PAD ABDOMINAL 8X10 ST (GAUZE/BANDAGES/DRESSINGS) ×2 IMPLANT
DURAPREP 26ML APPLICATOR (WOUND CARE) ×2 IMPLANT
ELECT REM PT RETURN 9FT ADLT (ELECTROSURGICAL) ×2
ELECTRODE REM PT RTRN 9FT ADLT (ELECTROSURGICAL) ×1 IMPLANT
GAUZE SPONGE 4X4 12PLY STRL (GAUZE/BANDAGES/DRESSINGS) ×2 IMPLANT
GLOVE BIO SURGEON STRL SZ7.5 (GLOVE) ×4 IMPLANT
GLOVE BIO SURGEON STRL SZ8 (GLOVE) IMPLANT
GLOVE BIOGEL PI IND STRL 8 (GLOVE) ×2 IMPLANT
GLOVE BIOGEL PI INDICATOR 8 (GLOVE) ×2
GOWN STRL REUS W/ TWL LRG LVL3 (GOWN DISPOSABLE) ×2 IMPLANT
GOWN STRL REUS W/ TWL XL LVL3 (GOWN DISPOSABLE) ×1 IMPLANT
GOWN STRL REUS W/TWL LRG LVL3 (GOWN DISPOSABLE) ×2
GOWN STRL REUS W/TWL XL LVL3 (GOWN DISPOSABLE) ×1
K-WIRE ORTHOPEDIC 1.4X150L (WIRE) ×6
KIT BASIN OR (CUSTOM PROCEDURE TRAY) ×2 IMPLANT
KIT ROOM TURNOVER OR (KITS) ×2 IMPLANT
KWIRE ORTHOPEDIC 1.4X150L (WIRE) ×3 IMPLANT
MANIFOLD NEPTUNE II (INSTRUMENTS) ×2 IMPLANT
NEEDLE 22X1 1/2 (OR ONLY) (NEEDLE) ×2 IMPLANT
NS IRRIG 1000ML POUR BTL (IV SOLUTION) ×2 IMPLANT
PACK ORTHO EXTREMITY (CUSTOM PROCEDURE TRAY) ×2 IMPLANT
PAD ARMBOARD 7.5X6 YLW CONV (MISCELLANEOUS) ×4 IMPLANT
PAD CAST 4YDX4 CTTN HI CHSV (CAST SUPPLIES) ×1 IMPLANT
PADDING CAST COTTON 4X4 STRL (CAST SUPPLIES) ×1
PADDING CAST COTTON 6X4 STRL (CAST SUPPLIES) ×2 IMPLANT
PLATE 1/3 TUBULAR 7H (Plate) ×2 IMPLANT
SCREW CANC 2.5XFT HEX12X4X (Screw) ×2 IMPLANT
SCREW CANC FT 16X4X2.5XHEX (Screw) ×1 IMPLANT
SCREW CANCELLOUS 4.0X12MM (Screw) ×2 IMPLANT
SCREW CANCELLOUS 4.0X16MM (Screw) ×1 IMPLANT
SCREW CANNULATED TI 4.0X44 (Screw) ×4 IMPLANT
SCREW CORTEX ST MATTA 3.5X12MM (Screw) ×6 IMPLANT
SCREW CORTEX ST MATTA 3.5X16MM (Screw) ×2 IMPLANT
SPLINT PLASTER CAST XFAST 5X30 (CAST SUPPLIES) ×1 IMPLANT
SPLINT PLASTER XFAST SET 5X30 (CAST SUPPLIES) ×1
SPONGE LAP 18X18 X RAY DECT (DISPOSABLE) IMPLANT
STRIP CLOSURE SKIN 1/2X4 (GAUZE/BANDAGES/DRESSINGS) IMPLANT
SUCTION FRAZIER HANDLE 10FR (MISCELLANEOUS) ×1
SUCTION TUBE FRAZIER 10FR DISP (MISCELLANEOUS) ×1 IMPLANT
SUT ETHILON 3 0 PS 1 (SUTURE) ×6 IMPLANT
SUT MNCRL AB 4-0 PS2 18 (SUTURE) ×2 IMPLANT
SUT MON AB 2-0 CT1 27 (SUTURE) IMPLANT
SUT VIC AB 0 CT1 27 (SUTURE) ×1
SUT VIC AB 0 CT1 27XBRD ANBCTR (SUTURE) ×1 IMPLANT
SYR BULB IRRIGATION 50ML (SYRINGE) IMPLANT
SYR CONTROL 10ML LL (SYRINGE) ×2 IMPLANT
TOWEL OR 17X24 6PK STRL BLUE (TOWEL DISPOSABLE) ×2 IMPLANT
TOWEL OR 17X26 10 PK STRL BLUE (TOWEL DISPOSABLE) ×2 IMPLANT
TUBE CONNECTING 12X1/4 (SUCTIONS) ×2 IMPLANT
UNDERPAD 30X30 (UNDERPADS AND DIAPERS) ×2 IMPLANT
WATER STERILE IRR 1000ML POUR (IV SOLUTION) ×2 IMPLANT
YANKAUER SUCT BULB TIP NO VENT (SUCTIONS) ×2 IMPLANT

## 2017-03-27 NOTE — Anesthesia Preprocedure Evaluation (Addendum)
Anesthesia Evaluation  Patient identified by MRN, date of birth, ID band Patient awake    Reviewed: Allergy & Precautions, NPO status , Patient's Chart, lab work & pertinent test results  Airway Mallampati: II  TM Distance: >3 FB Neck ROM: Full    Dental no notable dental hx.    Pulmonary neg pulmonary ROS, Current Smoker,    Pulmonary exam normal breath sounds clear to auscultation       Cardiovascular negative cardio ROS Normal cardiovascular exam Rhythm:Regular Rate:Normal     Neuro/Psych negative neurological ROS  negative psych ROS   GI/Hepatic negative GI ROS, Neg liver ROS, GERD  ,  Endo/Other  negative endocrine ROS  Renal/GU negative Renal ROS     Musculoskeletal negative musculoskeletal ROS (+)   Abdominal   Peds  Hematology negative hematology ROS (+)   Anesthesia Other Findings   Reproductive/Obstetrics negative OB ROS                             Anesthesia Physical Anesthesia Plan  ASA: II  Anesthesia Plan: Regional and General   Post-op Pain Management: GA combined w/ Regional for post-op pain   Induction: Intravenous  Airway Management Planned: LMA  Additional Equipment:   Intra-op Plan:   Post-operative Plan: Extubation in OR  Informed Consent: I have reviewed the patients History and Physical, chart, labs and discussed the procedure including the risks, benefits and alternatives for the proposed anesthesia with the patient or authorized representative who has indicated his/her understanding and acceptance.   Dental advisory given  Plan Discussed with: CRNA  Anesthesia Plan Comments:        Anesthesia Quick Evaluation

## 2017-03-27 NOTE — Transfer of Care (Signed)
Immediate Anesthesia Transfer of Care Note  Patient: Nichole Delgado  Procedure(s) Performed: Procedure(s): OPEN REDUCTION INTERNAL FIXATION (ORIF) BIMALLEOLAR ANKLE FRACTURE (Left)  Patient Location: PACU  Anesthesia Type:General and Regional  Level of Consciousness: sedated and responds to stimulation  Airway & Oxygen Therapy: Patient Spontanous Breathing and Patient connected to nasal cannula oxygen  Post-op Assessment: Report given to RN and Post -op Vital signs reviewed and stable  Post vital signs: Reviewed and stable  Last Vitals:  Vitals:   03/26/17 2151 03/27/17 0351  BP: (!) 150/60 (!) 143/51  Pulse: 85 73  Resp: 20   Temp: 37.4 C 37 C    Last Pain:  Vitals:   03/27/17 0800  TempSrc:   PainSc: 8          Complications: No apparent anesthesia complications

## 2017-03-27 NOTE — Progress Notes (Signed)
[facility] PROGRESS NOTE  HLEE FRINGER YPP:[id number] DOB: 1951/05/15 DOA: 03/26/2017   PCP: Gerrit Heck, MD     Assessment/Plan: Active Problems:   Vertigo   HLD (hyperlipidemia)   Fall   Tobacco abuse   Right calcaneal fracture   Closed fracture of both ankles   IBS (irritable bowel syndrome)   GERD (gastroesophageal reflux disease)   66 y.o. female with medical history significant of tobacco abuse, hyperlipidemia, GERD, vertigo, headache, IBS, mental illness (patient does not know the diagnosis, taking perphenazine), who presents with fall, injury to both ankles and right foot. Bilateral ankle fracture. Orthopedics consulted  Assessment and plan  Fracture of bilateral ankle, and right calcaneal bone:  Status post  OPEN REDUCTION INTERNAL FIXATION (ORIF) BIMALLEOLAR ANKLE FRACTURE  Orthopedic surgeon was consulted. Dr. Percell Miller    Will place on telemetry given multiple antipsychotic medications - Pain control: morphine prn and percocet - When necessary Zofran for nausea - Robaxin for muscle spasm - Appreciated Dr. consultation - type and cross - INR/PTT PT OT evaluation pending   Leukocytosis: Likely due to stress-induced demargination. Patient does not have signs of infection. Leukocytosis improving Will check UA  Vertigo: -Continue when necessary meclizine  HLD (hyperlipidemia): -Continue pravastatin  IBS: No nausea, vomiting, abdominal pain or diarrhea -Continue home Viberzi  Fall: due to accident. No prodromal symptoms. -PT/OT when able to  GERD: -Protonix  Lung nodule: An incidental finding, Nodular consolidation within the left lung apex, measuring 8 mm, possibly semi-solid nodule, alternatively chronic scarring or atelectasis.  -need to follow-up with PCP to get non-contrast CT at 3-6 months to confirm persistence.    DVT prophylaxsis Lovenox starting tomorrow  Code Status:  Full code    Family  Communication: Discussed in detail with the patient, all imaging results, lab results explained to the patient   Disposition Plan:  Anticipate discharge early next week     Consultants:  Orthopedics  Procedures:  NONE   Antibiotics: Anti-infectives    Start     Dose/Rate Route Frequency Ordered Stop   03/27/17 1026  ceFAZolin (ANCEF) IVPB 2g/100 mL premix     2 g 200 mL/hr over 30 Minutes Intravenous To [facility] 03/26/17 2249 03/28/17 1030   03/27/17 0600  ceFAZolin (ANCEF) IVPB 2g/100 mL premix  Status:  Discontinued     2 g 200 mL/hr over 30 Minutes Intravenous To [facility] 03/26/17 2155 03/26/17 2249         HPI/Subjective: Bilateral ankle pain, low-grade fever  Objective: Vitals:   03/26/17 2115 03/26/17 2130 03/26/17 2151 03/27/17 0351  BP: (!) 131/58 135/66 (!) 150/60 (!) 143/51  Pulse: 89 87 85 73  Resp: (!) 21 20 20    Temp:   99.3 F (37.4 C) 98.6 F (37 C)  TempSrc:   Oral Oral  SpO2: 95% 94% 94% 92%  Weight:        Intake/Output Summary (Last 24 hours) at 03/27/17 0850 Last data filed at 03/27/17 0305  Gross per 24 hour  Intake               10 ml  Output              350 ml  Net             -340 ml    Exam:  Examination:  General exam: Appears calm and comfortable  Respiratory system: Clear to auscultation. Respiratory effort normal. Cardiovascular system: S1 &  S2 heard, RRR. No JVD, murmurs, rubs, gallops or clicks. No pedal edema. Gastrointestinal system: Abdomen is nondistended, soft and nontender. No organomegaly or masses felt. Normal bowel sounds heard. Central nervous system: Alert and oriented. No focal neurological deficits. Extremities: Symmetric 5 x 5 power. Skin: No rashes, lesions or ulcers Psychiatry: Judgement and insight appear normal. Mood & affect appropriate.     Data Reviewed: I have personally reviewed following labs and imaging studies  Micro Results Recent Results (from the past 240 hour(s))   MRSA PCR Screening     Status: None   Collection Time: 03/27/17  4:31 AM  Result Value Ref Range Status   MRSA by PCR NEGATIVE NEGATIVE Final    Comment:        The GeneXpert MRSA Assay (FDA approved for NASAL specimens only), is one component of a comprehensive MRSA colonization surveillance program. It is not intended to diagnose MRSA infection nor to guide or monitor treatment for MRSA infections.     Radiology Reports Dg Lumbar Spine Complete  Result Date: 03/26/2017 CLINICAL DATA:  Pt fell from attic at 1 pm today. Pt c/o right heel and lateral ankle pain, left ankle pain with medial bruising and swelling, and lower back pain. Only previous sx is to right great toe. EXAM: LUMBAR SPINE - COMPLETE 4+ VIEW COMPARISON:  None. FINDINGS: Minimal scoliosis which may be positional in nature. Alignment is otherwise normal. No evidence of acute vertebral body subluxation. No fracture line or displaced fracture fragment seen. No compression fracture deformity. Minimal degenerative spurring within the upper and mid lumbar spine. No evidence of advanced degenerative change at any level. Facet joints appear intact and normally aligned. Upper sacrum appears intact and normally aligned. Paravertebral soft tissues are unremarkable. IMPRESSION: No acute findings. No fracture or acute subluxation within the lumbar spine. Electronically Signed   By: Franki Cabot M.D.   On: 03/26/2017 16:09   Dg Ankle Complete Left  Result Date: 03/26/2017 CLINICAL DATA:  Insert Clinic EXAM: LEFT ANKLE COMPLETE - 3+ VIEW COMPARISON:  None. FINDINGS: Displaced fracture within the medial malleolus, with lateral displacement of the fractured medial malleolus, and medial displacement of the distal tibia relative to the underlying talus. Associated distortion of the ankle mortise. Additional displaced fracture within the upper portion of the lateral malleolus. O fracture seen within the talar dome. No fracture seen within the  visualized portions of the hindfoot or midfoot. IMPRESSION: 1. Displaced fracture within the medial malleolus. Associated medial displacement of the distal tibia relative to the underlying talus. Associated distortion of the ankle mortise. 2. Additional displaced fracture within the distal fibula. Electronically Signed   By: Franki Cabot M.D.   On: 03/26/2017 16:08   Dg Ankle Complete Right  Result Date: 03/26/2017 CLINICAL DATA:  Pt fell from attic at 1 pm today. Pt c/o right heel and lateral ankle pain, left ankle pain with medial bruising and swelling, and lower back pain. Only previous sx is to right great toe. EXAM: RIGHT ANKLE - COMPLETE 3+ VIEW COMPARISON:  None. FINDINGS: Displaced/comminuted fracture within the distal right fibula. Distal tibia appears intact and normally aligned. Ankle mortise is symmetric. Displaced/comminuted fractures within the posterior calcaneus. Probable associated diastases at the overlying subtalar joint spaces. IMPRESSION: 1. Displaced/comminuted fracture within the distal right fibula. 2. Displaced/comminuted fractures within the posterior calcaneus. Probable associated diastases at the overlying subtalar joint spaces. Consider CT for more definitive characterization of fracture extent and osseous alignment. Electronically Signed   By:  Franki Cabot M.D.   On: 03/26/2017 16:05   Ct Head Wo Contrast  Result Date: 03/26/2017 CLINICAL DATA:  Status post fall. EXAM: CT HEAD WITHOUT CONTRAST CT CERVICAL SPINE WITHOUT CONTRAST TECHNIQUE: Multidetector CT imaging of the head and cervical spine was performed following the standard protocol without intravenous contrast. Multiplanar CT image reconstructions of the cervical spine were also generated. COMPARISON:  None. FINDINGS: CT HEAD FINDINGS Brain: Ventricles are normal in size and configuration. There is no hemorrhage, edema or other evidence of acute parenchymal abnormality. No extra-axial hemorrhage. Vascular: No hyperdense  vessel or unexpected calcification. Skull: Normal. Negative for fracture or focal lesion. Sinuses/Orbits: No acute finding. Other: Soft tissue edema/laceration overlying the posterior left parietal-occipital bone. No underlying fracture. Slight deformity of the left nasal bone, likely chronic. CT CERVICAL SPINE FINDINGS Alignment: Mild straightening of the normal cervical lordosis related to underlying degenerative change. No evidence of acute vertebral body subluxation. Skull base and vertebrae: No fracture line or displaced fracture fragment identified. Facet joints appear intact and normally aligned throughout. Soft tissues and spinal canal: No prevertebral fluid or swelling. No visible canal hematoma. Disc levels: Mild degenerative spurring throughout the mid and lower cervical spine, with associated mild disc-osteophytic bulges at the C3-4 through C6-7 levels. No more than mild central canal stenosis at any level. Upper chest: Nodular consolidation within the medial aspects of the left lung apex, measuring 8 mm (series 302, image 90), possibly a semi-solid nodule, too small to definitively characterize. Other: Subcentimeter hypodense focus within the right thyroid lobe, most likely a benign colloid nodule. Carotid atherosclerosis. IMPRESSION: 1. Soft tissue edema/laceration overlying the left posterior parietal-occipital bone. No underlying fracture. 2. No acute intracranial abnormality. No intracranial hemorrhage or edema. 3. No fracture or acute subluxation within the cervical spine. Degenerative changes of the cervical spine, as detailed above. 4. **An incidental finding of potential clinical significance has been found. Nodular consolidation within the left lung apex, measuring 8 mm, possibly semi-solid nodule, alternatively chronic scarring or atelectasis. Follow-up non-contrast CT recommended at 3-6 months to confirm persistence. If unchanged, and solid component remains <6 mm, annual CT is recommended  until 5 years of stability has been established. If persistent these nodules should be considered highly suspicious if the solid component of the nodule is 6 mm or greater in size and enlarging. This recommendation follows the consensus statement: Guidelines for Management of Incidental Pulmonary Nodules Detected on CT Images: From the Fleischner Society 2017; Radiology 2017; 284:228-243.** 5. Carotid atherosclerosis. Electronically Signed   By: Franki Cabot M.D.   On: 03/26/2017 17:28   Ct Cervical Spine Wo Contrast  Result Date: 03/26/2017 CLINICAL DATA:  Status post fall. EXAM: CT HEAD WITHOUT CONTRAST CT CERVICAL SPINE WITHOUT CONTRAST TECHNIQUE: Multidetector CT imaging of the head and cervical spine was performed following the standard protocol without intravenous contrast. Multiplanar CT image reconstructions of the cervical spine were also generated. COMPARISON:  None. FINDINGS: CT HEAD FINDINGS Brain: Ventricles are normal in size and configuration. There is no hemorrhage, edema or other evidence of acute parenchymal abnormality. No extra-axial hemorrhage. Vascular: No hyperdense vessel or unexpected calcification. Skull: Normal. Negative for fracture or focal lesion. Sinuses/Orbits: No acute finding. Other: Soft tissue edema/laceration overlying the posterior left parietal-occipital bone. No underlying fracture. Slight deformity of the left nasal bone, likely chronic. CT CERVICAL SPINE FINDINGS Alignment: Mild straightening of the normal cervical lordosis related to underlying degenerative change. No evidence of acute vertebral body subluxation. Skull base and vertebrae:  No fracture line or displaced fracture fragment identified. Facet joints appear intact and normally aligned throughout. Soft tissues and spinal canal: No prevertebral fluid or swelling. No visible canal hematoma. Disc levels: Mild degenerative spurring throughout the mid and lower cervical spine, with associated mild disc-osteophytic  bulges at the C3-4 through C6-7 levels. No more than mild central canal stenosis at any level. Upper chest: Nodular consolidation within the medial aspects of the left lung apex, measuring 8 mm (series 302, image 90), possibly a semi-solid nodule, too small to definitively characterize. Other: Subcentimeter hypodense focus within the right thyroid lobe, most likely a benign colloid nodule. Carotid atherosclerosis. IMPRESSION: 1. Soft tissue edema/laceration overlying the left posterior parietal-occipital bone. No underlying fracture. 2. No acute intracranial abnormality. No intracranial hemorrhage or edema. 3. No fracture or acute subluxation within the cervical spine. Degenerative changes of the cervical spine, as detailed above. 4. **An incidental finding of potential clinical significance has been found. Nodular consolidation within the left lung apex, measuring 8 mm, possibly semi-solid nodule, alternatively chronic scarring or atelectasis. Follow-up non-contrast CT recommended at 3-6 months to confirm persistence. If unchanged, and solid component remains <6 mm, annual CT is recommended until 5 years of stability has been established. If persistent these nodules should be considered highly suspicious if the solid component of the nodule is 6 mm or greater in size and enlarging. This recommendation follows the consensus statement: Guidelines for Management of Incidental Pulmonary Nodules Detected on CT Images: From the Fleischner Society 2017; Radiology 2017; 284:228-243.** 5. Carotid atherosclerosis. Electronically Signed   By: Franki Cabot M.D.   On: 03/26/2017 17:28   Ct Ankle Right Wo Contrast  Result Date: 03/26/2017 CLINICAL DATA:  Right ankle fracture after falling through attic floor landing on both feet. EXAM: CT OF THE RIGHT ANKLE WITHOUT CONTRAST TECHNIQUE: Multidetector CT imaging of the right ankle was performed according to the standard protocol. Multiplanar CT image reconstructions were also  generated. COMPARISON:  03/26/2017 ankle radiographs FINDINGS: Bones/Joint/Cartilage 1. Closed, comminuted distal fibular diaphyseal fracture 2.5 cm from the ankle mortise. Slight lateral angulation of the main distal fracture fragment with buckling and 3 mm of lateral displacement of a small butterfly fracture fragment. 2. 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 No encroachment on the sinus tarsi. 3. Fracture along the long axis of the calcaneus is noted on the sagittal reformats through the body and posterior calcaneus with a coronal fracture fragment extending to the plantar surface off this fracture (series 201, image 17) and a second extending through the posterior calcaneus (series 204, image 24). 3 mm of offset is noted at the posterior subtalar joint (series 204, image 20). 4. Tiny ossific density off the plantar dorsal aspect of the cuboid may represent a small avulsion, age indeterminate given its slightly rounded margins. 5. The ankle mortise is maintained. No distal tibial fracture is seen. The midfoot articulations are appear congruent. Ligaments Suboptimally assessed by CT. Muscles and Tendons Intact peroneal tendons. Intact Achilles. Intact extensor tendons crossing the ankle joint. Intact tibialis posterior and flexor digitorum longus tendons. The flexor hallucis is adjacent to one of the fracture lines medially and there is potential for entrapment, series 202, image 64. Soft tissues Diffuse soft tissue swelling along the dorsal plantar aspect of the foot. IMPRESSION: 1. Acute, closed, distal fibular  diaphyseal fracture. 2. 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 3.  Age-indeterminate tiny ossific density off the plantar dorsal aspect of the cuboid. Electronically Signed   By: Ashley Royalty M.D.   On: 03/26/2017 18:08   Dg Ankle Left Port  Result Date: 03/26/2017 CLINICAL DATA:  Status post reduction of left ankle fracture. Initial encounter. EXAM: PORTABLE LEFT ANKLE - 2 VIEW COMPARISON:  Left ankle radiographs performed earlier today at 3:08 p.m. FINDINGS: There is improved alignment of the medial malleolar fracture. A minimally displaced distal fibular fracture is again noted. There is mild residual lateral tilt of the talus, with mild widening of the medial aspect of the ankle mortise. Mild surrounding soft tissue swelling is noted. A splint is noted about the lower leg and ankle. IMPRESSION: Improved alignment of the medial malleolar fracture. Minimally displaced distal fibular fracture again noted. Mild residual lateral tilt of the talus, with mild widening of the medial aspect of the ankle mortise. Electronically Signed   By: Garald Balding M.D.   On: 03/26/2017 20:04   Dg Foot Complete Left  Result Date: 03/26/2017 CLINICAL DATA:  [facility] EXAM: LEFT FOOT - COMPLETE 3+ VIEW COMPARISON:  None. FINDINGS: No fracture line or displaced fracture fragment seen within the hindfoot, midfoot or forefoot. Displaced fractures of the medial malleolus and lateral malleolus are better demonstrated on the dedicated ankle exam. IMPRESSION: Negative left foot. Electronically Signed   By: Franki Cabot M.D.   On: 03/26/2017 16:10   Dg Foot Complete Right  Result Date: 03/26/2017 CLINICAL DATA:  Pt fell from attic at 1 pm today. Pt c/o right heel and lateral ankle pain, left ankle pain with medial bruising and swelling, and lower back pain. Only previous sx is to right great toe. EXAM: RIGHT FOOT COMPLETE - 3+ VIEW COMPARISON:  None. FINDINGS: Displaced/comminuted fractures within the posterior calcaneus. Probable associated diastases at the overlying subtalar joint spaces.  IMPRESSION: Displaced/comminuted fractures within the posterior calcaneus. Probable associated diastases at the overlying subtalar joint spaces. Consider CT for more definitive characterization of fracture extent and osseous alignment. Electronically Signed   By: Franki Cabot M.D.   On: 03/26/2017 16:04     CBC  Recent Labs Lab 03/26/17 1559 03/27/17 0232  WBC 18.5* 11.1*  HGB 15.5* 13.5  HCT 45.7 40.6  PLT 224 224  MCV 90.3 90.8  MCH 30.6 30.2  MCHC 33.9 33.3  RDW 13.1 13.2  LYMPHSABS 2.3  --   MONOABS 0.9  --   EOSABS 0.0  --   BASOSABS 0.1  --     Chemistries   Recent Labs Lab 03/26/17 1559 03/27/17 0232  NA 139 140  K 4.2 3.7  CL 105 109  CO2 23 25  GLUCOSE 106* 149*  BUN 14 13  CREATININE 0.89 0.98  CALCIUM 9.5 8.5*   ------------------------------------------------------------------------------------------------------------------ estimated creatinine clearance is 43.7 mL/min (by C-G formula based on SCr of 0.98 mg/dL). ------------------------------------------------------------------------------------------------------------------ No results for input(s): HGBA1C in the last 72 hours. ------------------------------------------------------------------------------------------------------------------ No results for input(s): CHOL, HDL, LDLCALC, TRIG, CHOLHDL, LDLDIRECT in the last 72 hours. ------------------------------------------------------------------------------------------------------------------ No results for input(s): TSH, T4TOTAL, T3FREE, THYROIDAB in the last 72 hours.  Invalid input(s): FREET3 ------------------------------------------------------------------------------------------------------------------ No results for input(s): VITAMINB12, FOLATE, FERRITIN, TIBC, IRON, RETICCTPCT in the last 72 hours.  Coagulation profile  Recent Labs Lab 03/26/17 2100  INR 0.94    No results for input(s): DDIMER in the last 72 hours.  Cardiac  Enzymes No results for input(s): CKMB, TROPONINI, MYOGLOBIN in the last 168 hours.  Invalid input(s): CK ------------------------------------------------------------------------------------------------------------------ Invalid input(s): POCBNP   CBG: No results for input(s): GLUCAP in the last 168 hours.     Studies: Dg Lumbar Spine Complete  Result Date: 03/26/2017 CLINICAL DATA:  Pt fell from attic at 1 pm today. Pt c/o right heel and lateral ankle pain, left ankle pain with medial bruising and swelling, and lower back pain. Only previous sx is to right great toe. EXAM: LUMBAR SPINE - COMPLETE 4+ VIEW COMPARISON:  None. FINDINGS: Minimal scoliosis which may be positional in nature. Alignment is otherwise normal. No evidence of acute vertebral body subluxation. No fracture line or displaced fracture fragment seen. No compression fracture deformity. Minimal degenerative spurring within the upper and mid lumbar spine. No evidence of advanced degenerative change at any level. Facet joints appear intact and normally aligned. Upper sacrum appears intact and normally aligned. Paravertebral soft tissues are unremarkable. IMPRESSION: No acute findings. No fracture or acute subluxation within the lumbar spine. Electronically Signed   By: Franki Cabot M.D.   On: 03/26/2017 16:09   Dg Ankle Complete Left  Result Date: 03/26/2017 CLINICAL DATA:  Insert Clinic EXAM: LEFT ANKLE COMPLETE - 3+ VIEW COMPARISON:  None. FINDINGS: Displaced fracture within the medial malleolus, with lateral displacement of the fractured medial malleolus, and medial displacement of the distal tibia relative to the underlying talus. Associated distortion of the ankle mortise. Additional displaced fracture within the upper portion of the lateral malleolus. O fracture seen within the talar dome. No fracture seen within the visualized portions of the hindfoot or midfoot. IMPRESSION: 1. Displaced fracture within the medial malleolus.  Associated medial displacement of the distal tibia relative to the underlying talus. Associated distortion of the ankle mortise. 2. Additional displaced fracture within the distal fibula. Electronically Signed   By: Franki Cabot M.D.   On: 03/26/2017 16:08   Dg Ankle Complete Right  Result Date: 03/26/2017 CLINICAL DATA:  Pt fell from attic at 1 pm today. Pt c/o right heel and lateral ankle pain, left ankle pain with medial bruising and swelling, and lower back pain. Only previous sx is to right great toe. EXAM: RIGHT ANKLE - COMPLETE 3+ VIEW COMPARISON:  None. FINDINGS: Displaced/comminuted fracture within the distal right fibula. Distal tibia appears intact and normally aligned. Ankle mortise is symmetric. Displaced/comminuted fractures within the posterior calcaneus. Probable associated diastases at the overlying subtalar joint spaces. IMPRESSION: 1. Displaced/comminuted fracture within the distal right fibula. 2. Displaced/comminuted fractures within the posterior calcaneus. Probable associated diastases at the overlying subtalar joint spaces. Consider CT for more definitive characterization of fracture extent and osseous alignment. Electronically Signed   By: Franki Cabot M.D.   On: 03/26/2017 16:05   Ct Head Wo Contrast  Result Date: 03/26/2017 CLINICAL DATA:  Status post fall. EXAM: CT HEAD WITHOUT CONTRAST CT CERVICAL SPINE WITHOUT CONTRAST TECHNIQUE: Multidetector CT imaging of the head and cervical spine was performed following the standard protocol without intravenous contrast. Multiplanar CT image reconstructions of the cervical spine were also generated. COMPARISON:  None. FINDINGS: CT HEAD FINDINGS Brain: Ventricles are normal in size and configuration. There is no hemorrhage, edema or other evidence of acute parenchymal abnormality. No extra-axial hemorrhage. Vascular: No hyperdense vessel or unexpected calcification. Skull: Normal. Negative for fracture or focal lesion. Sinuses/Orbits: No  acute finding. Other: Soft  tissue edema/laceration overlying the posterior left parietal-occipital bone. No underlying fracture. Slight deformity of the left nasal bone, likely chronic. CT CERVICAL SPINE FINDINGS Alignment: Mild straightening of the normal cervical lordosis related to underlying degenerative change. No evidence of acute vertebral body subluxation. Skull base and vertebrae: No fracture line or displaced fracture fragment identified. Facet joints appear intact and normally aligned throughout. Soft tissues and spinal canal: No prevertebral fluid or swelling. No visible canal hematoma. Disc levels: Mild degenerative spurring throughout the mid and lower cervical spine, with associated mild disc-osteophytic bulges at the C3-4 through C6-7 levels. No more than mild central canal stenosis at any level. Upper chest: Nodular consolidation within the medial aspects of the left lung apex, measuring 8 mm (series 302, image 90), possibly a semi-solid nodule, too small to definitively characterize. Other: Subcentimeter hypodense focus within the right thyroid lobe, most likely a benign colloid nodule. Carotid atherosclerosis. IMPRESSION: 1. Soft tissue edema/laceration overlying the left posterior parietal-occipital bone. No underlying fracture. 2. No acute intracranial abnormality. No intracranial hemorrhage or edema. 3. No fracture or acute subluxation within the cervical spine. Degenerative changes of the cervical spine, as detailed above. 4. **An incidental finding of potential clinical significance has been found. Nodular consolidation within the left lung apex, measuring 8 mm, possibly semi-solid nodule, alternatively chronic scarring or atelectasis. Follow-up non-contrast CT recommended at 3-6 months to confirm persistence. If unchanged, and solid component remains <6 mm, annual CT is recommended until 5 years of stability has been established. If persistent these nodules should be considered highly  suspicious if the solid component of the nodule is 6 mm or greater in size and enlarging. This recommendation follows the consensus statement: Guidelines for Management of Incidental Pulmonary Nodules Detected on CT Images: From the Fleischner Society 2017; Radiology 2017; 284:228-243.** 5. Carotid atherosclerosis. Electronically Signed   By: Franki Cabot M.D.   On: 03/26/2017 17:28   Ct Cervical Spine Wo Contrast  Result Date: 03/26/2017 CLINICAL DATA:  Status post fall. EXAM: CT HEAD WITHOUT CONTRAST CT CERVICAL SPINE WITHOUT CONTRAST TECHNIQUE: Multidetector CT imaging of the head and cervical spine was performed following the standard protocol without intravenous contrast. Multiplanar CT image reconstructions of the cervical spine were also generated. COMPARISON:  None. FINDINGS: CT HEAD FINDINGS Brain: Ventricles are normal in size and configuration. There is no hemorrhage, edema or other evidence of acute parenchymal abnormality. No extra-axial hemorrhage. Vascular: No hyperdense vessel or unexpected calcification. Skull: Normal. Negative for fracture or focal lesion. Sinuses/Orbits: No acute finding. Other: Soft tissue edema/laceration overlying the posterior left parietal-occipital bone. No underlying fracture. Slight deformity of the left nasal bone, likely chronic. CT CERVICAL SPINE FINDINGS Alignment: Mild straightening of the normal cervical lordosis related to underlying degenerative change. No evidence of acute vertebral body subluxation. Skull base and vertebrae: No fracture line or displaced fracture fragment identified. Facet joints appear intact and normally aligned throughout. Soft tissues and spinal canal: No prevertebral fluid or swelling. No visible canal hematoma. Disc levels: Mild degenerative spurring throughout the mid and lower cervical spine, with associated mild disc-osteophytic bulges at the C3-4 through C6-7 levels. No more than mild central canal stenosis at any level. Upper  chest: Nodular consolidation within the medial aspects of the left lung apex, measuring 8 mm (series 302, image 90), possibly a semi-solid nodule, too small to definitively characterize. Other: Subcentimeter hypodense focus within the right thyroid lobe, most likely a benign colloid nodule. Carotid atherosclerosis. IMPRESSION: 1. Soft tissue edema/laceration overlying the  left posterior parietal-occipital bone. No underlying fracture. 2. No acute intracranial abnormality. No intracranial hemorrhage or edema. 3. No fracture or acute subluxation within the cervical spine. Degenerative changes of the cervical spine, as detailed above. 4. **An incidental finding of potential clinical significance has been found. Nodular consolidation within the left lung apex, measuring 8 mm, possibly semi-solid nodule, alternatively chronic scarring or atelectasis. Follow-up non-contrast CT recommended at 3-6 months to confirm persistence. If unchanged, and solid component remains <6 mm, annual CT is recommended until 5 years of stability has been established. If persistent these nodules should be considered highly suspicious if the solid component of the nodule is 6 mm or greater in size and enlarging. This recommendation follows the consensus statement: Guidelines for Management of Incidental Pulmonary Nodules Detected on CT Images: From the Fleischner Society 2017; Radiology 2017; 284:228-243.** 5. Carotid atherosclerosis. Electronically Signed   By: Franki Cabot M.D.   On: 03/26/2017 17:28   Ct Ankle Right Wo Contrast  Result Date: 03/26/2017 CLINICAL DATA:  Right ankle fracture after falling through attic floor landing on both feet. EXAM: CT OF THE RIGHT ANKLE WITHOUT CONTRAST TECHNIQUE: Multidetector CT imaging of the right ankle was performed according to the standard protocol. Multiplanar CT image reconstructions were also generated. COMPARISON:  03/26/2017 ankle radiographs FINDINGS: Bones/Joint/Cartilage 1. Closed,  comminuted distal fibular diaphyseal fracture 2.5 cm from the ankle mortise. Slight lateral angulation of the main distal fracture fragment with buckling and 3 mm of lateral displacement of a small butterfly fracture fragment. 2. 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 No encroachment on the sinus tarsi. 3. Fracture along the long axis of the calcaneus is noted on the sagittal reformats through the body and posterior calcaneus with a coronal fracture fragment extending to the plantar surface off this fracture (series 201, image 17) and a second extending through the posterior calcaneus (series 204, image 24). 3 mm of offset is noted at the posterior subtalar joint (series 204, image 20). 4. Tiny ossific density off the plantar dorsal aspect of the cuboid may represent a small avulsion, age indeterminate given its slightly rounded margins. 5. The ankle mortise is maintained. No distal tibial fracture is seen. The midfoot articulations are appear congruent. Ligaments Suboptimally assessed by CT. Muscles and Tendons Intact peroneal tendons. Intact Achilles. Intact extensor tendons crossing the ankle joint. Intact tibialis posterior and flexor digitorum longus tendons. The flexor hallucis is adjacent to one of the fracture lines medially and there is potential for entrapment, series 202, image 64. Soft tissues Diffuse soft tissue swelling along the dorsal plantar aspect of the foot. IMPRESSION: 1. Acute, closed, distal fibular diaphyseal fracture. 2. 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 3. Age-indeterminate tiny ossific density off the plantar dorsal aspect of the cuboid. Electronically  Signed   By: Ashley Royalty M.D.   On: 03/26/2017 18:08   Dg Ankle Left Port  Result Date: 03/26/2017 CLINICAL DATA:  Status post reduction of left ankle fracture. Initial encounter. EXAM: PORTABLE LEFT ANKLE - 2 VIEW COMPARISON:  Left ankle radiographs performed earlier today at 3:08 p.m. FINDINGS: There is improved alignment of the medial malleolar fracture. A minimally displaced distal fibular fracture  is again noted. There is mild residual lateral tilt of the talus, with mild widening of the medial aspect of the ankle mortise. Mild surrounding soft tissue swelling is noted. A splint is noted about the lower leg and ankle. IMPRESSION: Improved alignment of the medial malleolar fracture. Minimally displaced distal fibular fracture again noted. Mild residual lateral tilt of the talus, with mild widening of the medial aspect of the ankle mortise. Electronically Signed   By: Garald Balding M.D.   On: 03/26/2017 20:04   Dg Foot Complete Left  Result Date: 03/26/2017 CLINICAL DATA:  [facility] EXAM: LEFT FOOT - COMPLETE 3+ VIEW COMPARISON:  None. FINDINGS: No fracture line or displaced fracture fragment seen within the hindfoot, midfoot or forefoot. Displaced fractures of the medial malleolus and lateral malleolus are better demonstrated on the dedicated ankle exam. IMPRESSION: Negative left foot. Electronically Signed   By: Franki Cabot M.D.   On: 03/26/2017 16:10   Dg Foot Complete Right  Result Date: 03/26/2017 CLINICAL DATA:  Pt fell from attic at 1 pm today. Pt c/o right heel and lateral ankle pain, left ankle pain with medial bruising and swelling, and lower back pain. Only previous sx is to right great toe. EXAM: RIGHT FOOT COMPLETE - 3+ VIEW COMPARISON:  None. FINDINGS: Displaced/comminuted fractures within the posterior calcaneus. Probable associated diastases at the overlying subtalar joint spaces. IMPRESSION: Displaced/comminuted fractures within the posterior calcaneus. Probable associated  diastases at the overlying subtalar joint spaces. Consider CT for more definitive characterization of fracture extent and osseous alignment. Electronically Signed   By: Franki Cabot M.D.   On: 03/26/2017 16:04      No results found for: HGBA1C Lab Results  Component Value Date   CREATININE 0.98 03/27/2017       Scheduled Meds: .  ceFAZolin (ANCEF) IV  2 g Intravenous To SS-Surg  . Eluxadoline  75 mg Oral BID  . fluticasone  2 spray Each Nare Daily  . ipratropium  2 spray Each Nare BID  . meloxicam  7.5 mg Oral Daily  . nicotine  21 mg Transdermal Daily  . pantoprazole  40 mg Oral Daily  . perphenazine  4 mg Oral QHS  . pravastatin  40 mg Oral QHS   Continuous Infusions: . sodium chloride 75 mL/hr (03/27/17 0442)  . lactated ringers       LOS: 1 day    Time spent: >30 MINS    Reyne Dumas  [facility] Pager [phone]. If 7PM-7AM, please contact night-coverage at [email or web address], password [facility] 03/27/2017, 8:50 AM  LOS: 1 day

## 2017-03-27 NOTE — Consult Note (Signed)
ORTHOPAEDIC CONSULTATION  REQUESTING PHYSICIAN: Reyne Dumas, MD  Chief Complaint: B Ankle / Foot pain  Assessment: Active Problems:   Vertigo   HLD (hyperlipidemia)   Fall   Tobacco abuse   Right calcaneal fracture   Closed fracture of both ankles   IBS (irritable bowel syndrome)   GERD (gastroesophageal reflux disease)  Left Medial and lateral maleolar fracture Right Fibula and calcaneous fracture  Recent hx of foot drop on the Left dt 4 bulging disks followed by Dr. Mayer Camel and Dr. Mina Marble.  Plan: ORIF Left ankle today. ORIF Right side planning in process. NWB BLE  The risks benefits and alternatives of the procedure were discussed with the patient including but not limited to infection, bleeding, nerve injury, the need for revision surgery, blood clots, cardiopulmonary complications, morbidity, mortality, among others.  The patient verbalizes understanding and wishes to proceed.     HPI: Nichole Delgado is a 66 y.o. female smoker who complains of Bilateral ankle pain after a fall from her attic to ground.  XR confirm Bilateral ankle fractures, Right calcaneous fracture.  Orthopedics was consulted for evaluation.  Past Medical History:  Diagnosis Date  . Anxiety   . HLD (hyperlipidemia)   . IBS (irritable bowel syndrome)   . Tobacco abuse   . Vertigo 07/07/2016   Past Surgical History:  Procedure Laterality Date  . CATARACT EXTRACTION Bilateral   . FOOT SURGERY  2011  . PARTIAL HYSTERECTOMY  1988   Social History   Social History  . Marital status: Married    Spouse name: N/A  . Number of children: 1  . Years of education: 83   Occupational History  . N/A    Social History Main Topics  . Smoking status: Current Every Day Smoker    Packs/day: 20.00    Years: 35.00  . Smokeless tobacco: Never Used  . Alcohol use No     Comment: 1 drink every few months  . Drug use: No  . Sexual activity: Not Asked   Other Topics Concern  . None   Social  History Narrative   Lives at home with her husband   Right-handed   Drinks about 5 cups of coffee per day   Family History  Problem Relation Age of Onset  . Heart attack Father   . Migraines Mother   . Cancer Sister     Pancreatic  . Cancer Brother     Liver  . Migraines Daughter    Allergies  Allergen Reactions  . Codeine Nausea Only  . Penicillins   . Sulfa Antibiotics Rash   Prior to Admission medications   Medication Sig Start Date End Date Taking? Authorizing Provider  Eluxadoline (VIBERZI) 75 MG TABS Take 75 mg by mouth 2 (two) times daily.   Yes Historical Provider, MD  fluticasone (FLONASE) 50 MCG/ACT nasal spray Place 2 sprays into both nostrils daily.  02/25/16  Yes Historical Provider, MD  meclizine (ANTIVERT) 25 MG tablet Take 25 mg by mouth daily as needed.  02/25/16  Yes Historical Provider, MD  meloxicam (MOBIC) 7.5 MG tablet Take 7.5 mg by mouth daily.    Yes Historical Provider, MD  omeprazole (PRILOSEC) 40 MG capsule Take 40 mg by mouth daily as needed (indigestion).  05/19/16  Yes Historical Provider, MD  perphenazine (TRILAFON) 4 MG tablet Take 4 mg by mouth at bedtime.   Yes Historical Provider, MD  pravastatin (PRAVACHOL) 40 MG tablet Take 40 mg by mouth at  bedtime.  06/15/16  Yes Historical Provider, MD  SUMAtriptan Succinate (IMITREX PO) Take by mouth daily as needed.   Yes Historical Provider, MD  traMADol (ULTRAM) 50 MG tablet Take 1 tablet (50 mg total) by mouth every 6 (six) hours as needed. 11/15/16  Yes York Spaniel, MD  zolpidem (AMBIEN) 10 MG tablet Take 10 mg by mouth at bedtime as needed for sleep.    Yes Historical Provider, MD  ALPRAZolam Prudy Feeler) 0.5 MG tablet Take 2 tablets approximately 45 minutes prior to the MRI study, take a third tablet if needed. Patient not taking: Reported on 03/26/2017 07/26/16   York Spaniel, MD  ipratropium (ATROVENT) 0.03 % nasal spray Place 2 sprays into the nose 2 (two) times daily. 02/03/12 02/02/13  Chelle Jeffery,  PA-C  rizatriptan (MAXALT-MLT) 10 MG disintegrating tablet Take 1 tablet (10 mg total) by mouth 3 (three) times daily as needed for migraine. Patient not taking: Reported on 03/26/2017 08/09/16   York Spaniel, MD   Dg Lumbar Spine Complete  Result Date: 03/26/2017 CLINICAL DATA:  Pt fell from attic at 1 pm today. Pt c/o right heel and lateral ankle pain, left ankle pain with medial bruising and swelling, and lower back pain. Only previous sx is to right great toe. EXAM: LUMBAR SPINE - COMPLETE 4+ VIEW COMPARISON:  None. FINDINGS: Minimal scoliosis which may be positional in nature. Alignment is otherwise normal. No evidence of acute vertebral body subluxation. No fracture line or displaced fracture fragment seen. No compression fracture deformity. Minimal degenerative spurring within the upper and mid lumbar spine. No evidence of advanced degenerative change at any level. Facet joints appear intact and normally aligned. Upper sacrum appears intact and normally aligned. Paravertebral soft tissues are unremarkable. IMPRESSION: No acute findings. No fracture or acute subluxation within the lumbar spine. Electronically Signed   By: Bary Richard M.D.   On: 03/26/2017 16:09   Dg Ankle Complete Left  Result Date: 03/26/2017 CLINICAL DATA:  Insert Clinic EXAM: LEFT ANKLE COMPLETE - 3+ VIEW COMPARISON:  None. FINDINGS: Displaced fracture within the medial malleolus, with lateral displacement of the fractured medial malleolus, and medial displacement of the distal tibia relative to the underlying talus. Associated distortion of the ankle mortise. Additional displaced fracture within the upper portion of the lateral malleolus. O fracture seen within the talar dome. No fracture seen within the visualized portions of the hindfoot or midfoot. IMPRESSION: 1. Displaced fracture within the medial malleolus. Associated medial displacement of the distal tibia relative to the underlying talus. Associated distortion of the  ankle mortise. 2. Additional displaced fracture within the distal fibula. Electronically Signed   By: Bary Richard M.D.   On: 03/26/2017 16:08   Dg Ankle Complete Right  Result Date: 03/26/2017 CLINICAL DATA:  Pt fell from attic at 1 pm today. Pt c/o right heel and lateral ankle pain, left ankle pain with medial bruising and swelling, and lower back pain. Only previous sx is to right great toe. EXAM: RIGHT ANKLE - COMPLETE 3+ VIEW COMPARISON:  None. FINDINGS: Displaced/comminuted fracture within the distal right fibula. Distal tibia appears intact and normally aligned. Ankle mortise is symmetric. Displaced/comminuted fractures within the posterior calcaneus. Probable associated diastases at the overlying subtalar joint spaces. IMPRESSION: 1. Displaced/comminuted fracture within the distal right fibula. 2. Displaced/comminuted fractures within the posterior calcaneus. Probable associated diastases at the overlying subtalar joint spaces. Consider CT for more definitive characterization of fracture extent and osseous alignment. Electronically Signed   By:  Franki Cabot M.D.   On: 03/26/2017 16:05   Ct Head Wo Contrast  Result Date: 03/26/2017 CLINICAL DATA:  Status post fall. EXAM: CT HEAD WITHOUT CONTRAST CT CERVICAL SPINE WITHOUT CONTRAST TECHNIQUE: Multidetector CT imaging of the head and cervical spine was performed following the standard protocol without intravenous contrast. Multiplanar CT image reconstructions of the cervical spine were also generated. COMPARISON:  None. FINDINGS: CT HEAD FINDINGS Brain: Ventricles are normal in size and configuration. There is no hemorrhage, edema or other evidence of acute parenchymal abnormality. No extra-axial hemorrhage. Vascular: No hyperdense vessel or unexpected calcification. Skull: Normal. Negative for fracture or focal lesion. Sinuses/Orbits: No acute finding. Other: Soft tissue edema/laceration overlying the posterior left parietal-occipital bone. No  underlying fracture. Slight deformity of the left nasal bone, likely chronic. CT CERVICAL SPINE FINDINGS Alignment: Mild straightening of the normal cervical lordosis related to underlying degenerative change. No evidence of acute vertebral body subluxation. Skull base and vertebrae: No fracture line or displaced fracture fragment identified. Facet joints appear intact and normally aligned throughout. Soft tissues and spinal canal: No prevertebral fluid or swelling. No visible canal hematoma. Disc levels: Mild degenerative spurring throughout the mid and lower cervical spine, with associated mild disc-osteophytic bulges at the C3-4 through C6-7 levels. No more than mild central canal stenosis at any level. Upper chest: Nodular consolidation within the medial aspects of the left lung apex, measuring 8 mm (series 302, image 90), possibly a semi-solid nodule, too small to definitively characterize. Other: Subcentimeter hypodense focus within the right thyroid lobe, most likely a benign colloid nodule. Carotid atherosclerosis. IMPRESSION: 1. Soft tissue edema/laceration overlying the left posterior parietal-occipital bone. No underlying fracture. 2. No acute intracranial abnormality. No intracranial hemorrhage or edema. 3. No fracture or acute subluxation within the cervical spine. Degenerative changes of the cervical spine, as detailed above. 4. **An incidental finding of potential clinical significance has been found. Nodular consolidation within the left lung apex, measuring 8 mm, possibly semi-solid nodule, alternatively chronic scarring or atelectasis. Follow-up non-contrast CT recommended at 3-6 months to confirm persistence. If unchanged, and solid component remains <6 mm, annual CT is recommended until 5 years of stability has been established. If persistent these nodules should be considered highly suspicious if the solid component of the nodule is 6 mm or greater in size and enlarging. This recommendation  follows the consensus statement: Guidelines for Management of Incidental Pulmonary Nodules Detected on CT Images: From the Fleischner Society 2017; Radiology 2017; 284:228-243.** 5. Carotid atherosclerosis. Electronically Signed   By: Franki Cabot M.D.   On: 03/26/2017 17:28   Ct Cervical Spine Wo Contrast  Result Date: 03/26/2017 CLINICAL DATA:  Status post fall. EXAM: CT HEAD WITHOUT CONTRAST CT CERVICAL SPINE WITHOUT CONTRAST TECHNIQUE: Multidetector CT imaging of the head and cervical spine was performed following the standard protocol without intravenous contrast. Multiplanar CT image reconstructions of the cervical spine were also generated. COMPARISON:  None. FINDINGS: CT HEAD FINDINGS Brain: Ventricles are normal in size and configuration. There is no hemorrhage, edema or other evidence of acute parenchymal abnormality. No extra-axial hemorrhage. Vascular: No hyperdense vessel or unexpected calcification. Skull: Normal. Negative for fracture or focal lesion. Sinuses/Orbits: No acute finding. Other: Soft tissue edema/laceration overlying the posterior left parietal-occipital bone. No underlying fracture. Slight deformity of the left nasal bone, likely chronic. CT CERVICAL SPINE FINDINGS Alignment: Mild straightening of the normal cervical lordosis related to underlying degenerative change. No evidence of acute vertebral body subluxation. Skull base and vertebrae:  No fracture line or displaced fracture fragment identified. Facet joints appear intact and normally aligned throughout. Soft tissues and spinal canal: No prevertebral fluid or swelling. No visible canal hematoma. Disc levels: Mild degenerative spurring throughout the mid and lower cervical spine, with associated mild disc-osteophytic bulges at the C3-4 through C6-7 levels. No more than mild central canal stenosis at any level. Upper chest: Nodular consolidation within the medial aspects of the left lung apex, measuring 8 mm (series 302, image  90), possibly a semi-solid nodule, too small to definitively characterize. Other: Subcentimeter hypodense focus within the right thyroid lobe, most likely a benign colloid nodule. Carotid atherosclerosis. IMPRESSION: 1. Soft tissue edema/laceration overlying the left posterior parietal-occipital bone. No underlying fracture. 2. No acute intracranial abnormality. No intracranial hemorrhage or edema. 3. No fracture or acute subluxation within the cervical spine. Degenerative changes of the cervical spine, as detailed above. 4. **An incidental finding of potential clinical significance has been found. Nodular consolidation within the left lung apex, measuring 8 mm, possibly semi-solid nodule, alternatively chronic scarring or atelectasis. Follow-up non-contrast CT recommended at 3-6 months to confirm persistence. If unchanged, and solid component remains <6 mm, annual CT is recommended until 5 years of stability has been established. If persistent these nodules should be considered highly suspicious if the solid component of the nodule is 6 mm or greater in size and enlarging. This recommendation follows the consensus statement: Guidelines for Management of Incidental Pulmonary Nodules Detected on CT Images: From the Fleischner Society 2017; Radiology 2017; 284:228-243.** 5. Carotid atherosclerosis. Electronically Signed   By: Franki Cabot M.D.   On: 03/26/2017 17:28   Ct Ankle Right Wo Contrast  Result Date: 03/26/2017 CLINICAL DATA:  Right ankle fracture after falling through attic floor landing on both feet. EXAM: CT OF THE RIGHT ANKLE WITHOUT CONTRAST TECHNIQUE: Multidetector CT imaging of the right ankle was performed according to the standard protocol. Multiplanar CT image reconstructions were also generated. COMPARISON:  03/26/2017 ankle radiographs FINDINGS: Bones/Joint/Cartilage 1. Closed, comminuted distal fibular diaphyseal fracture 2.5 cm from the ankle mortise. Slight lateral angulation of the main  distal fracture fragment with buckling and 3 mm of lateral displacement of a small butterfly fracture fragment. 2. 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 No encroachment on the sinus tarsi. 3. Fracture along the long axis of the calcaneus is noted on the sagittal reformats through the body and posterior calcaneus with a coronal fracture fragment extending to the plantar surface off this fracture (series 201, image 17) and a second extending through the posterior calcaneus (series 204, image 24). 3 mm of offset is noted at the posterior subtalar joint (series 204, image 20). 4. Tiny ossific density off the plantar dorsal aspect of the cuboid may represent a small avulsion, age indeterminate given its slightly rounded margins. 5. The ankle mortise is maintained. No distal tibial fracture is seen. The midfoot articulations are appear congruent. Ligaments Suboptimally assessed by CT. Muscles and Tendons Intact peroneal tendons. Intact Achilles. Intact extensor tendons crossing the ankle joint. Intact tibialis posterior and flexor digitorum longus tendons. The flexor hallucis is adjacent to one of the fracture lines medially and there is potential for entrapment, series 202, image 64. Soft tissues Diffuse soft tissue swelling along the dorsal plantar aspect of the foot. IMPRESSION: 1. Acute, closed, distal fibular  diaphyseal fracture. 2. 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 3. Age-indeterminate tiny ossific density off the plantar dorsal aspect of the cuboid. Electronically Signed   By: Ashley Royalty M.D.   On: 03/26/2017 18:08   Dg Ankle Left Port  Result Date: 03/26/2017 CLINICAL  DATA:  Status post reduction of left ankle fracture. Initial encounter. EXAM: PORTABLE LEFT ANKLE - 2 VIEW COMPARISON:  Left ankle radiographs performed earlier today at 3:08 p.m. FINDINGS: There is improved alignment of the medial malleolar fracture. A minimally displaced distal fibular fracture is again noted. There is mild residual lateral tilt of the talus, with mild widening of the medial aspect of the ankle mortise. Mild surrounding soft tissue swelling is noted. A splint is noted about the lower leg and ankle. IMPRESSION: Improved alignment of the medial malleolar fracture. Minimally displaced distal fibular fracture again noted. Mild residual lateral tilt of the talus, with mild widening of the medial aspect of the ankle mortise. Electronically Signed   By: Garald Balding M.D.   On: 03/26/2017 20:04   Dg Foot Complete Left  Result Date: 03/26/2017 CLINICAL DATA:  [facility] EXAM: LEFT FOOT - COMPLETE 3+ VIEW COMPARISON:  None. FINDINGS: No fracture line or displaced fracture fragment seen within the hindfoot, midfoot or forefoot. Displaced fractures of the medial malleolus and lateral malleolus are better demonstrated on the dedicated ankle exam. IMPRESSION: Negative left foot. Electronically Signed   By: Franki Cabot M.D.   On: 03/26/2017 16:10   Dg Foot Complete Right  Result Date: 03/26/2017 CLINICAL DATA:  Pt fell from attic at 1 pm today. Pt c/o right heel and lateral ankle pain, left ankle pain with medial bruising and swelling, and lower back pain. Only previous sx is to right great toe. EXAM: RIGHT FOOT COMPLETE - 3+ VIEW COMPARISON:  None. FINDINGS: Displaced/comminuted fractures within the posterior calcaneus. Probable associated diastases at the overlying subtalar joint spaces. IMPRESSION: Displaced/comminuted fractures within the posterior calcaneus. Probable associated diastases at the overlying subtalar joint spaces. Consider CT for more definitive characterization of fracture extent  and osseous alignment. Electronically Signed   By: Franki Cabot M.D.   On: 03/26/2017 16:04    Positive ROS: All other systems have been reviewed and were otherwise negative with the exception of those mentioned in the HPI and as above.  Objective: Labs cbc  Recent Labs  03/26/17 1559 03/27/17 0232  WBC 18.5* 11.1*  HGB 15.5* 13.5  HCT 45.7 40.6  PLT 224 224    Labs inflam No results for input(s): CRP in the last 72 hours.  Invalid input(s): ESR  Labs coag  Recent Labs  03/26/17 2100  INR 0.94     Recent Labs  03/26/17 1559 03/27/17 0232  NA 139 140  K 4.2 3.7  CL 105 109  CO2 23 25  GLUCOSE 106* 149*  BUN 14 13  CREATININE 0.89 0.98  CALCIUM 9.5 8.5*    Physical Exam: Vitals:   03/26/17 2151 03/27/17 0351  BP: (!) 150/60 (!) 143/51  Pulse: 85 73  Resp: 20   Temp: 99.3 F (37.4 C) 98.6 F (37 C)   General: Alert, no acute distress Mental status: Alert and Oriented x3 Neurologic: Speech Clear and organized, no gross focal findings or movement disorder appreciated. Respiratory: No cyanosis, no use of accessory musculature Cardiovascular: No pedal edema  GI: Abdomen is soft and non-tender, non-distended. Skin: Warm and dry.  No lesions in the area of chief complaint  Extremities: Warm and well perfused w/o edema Psychiatric: Patient is competent for consent with normal mood and affect  MUSCULOSKELETAL:  B ankles splinted.  Toes warm. Sensation intact distally.  EHL FHL intact. Other extremities are atraumatic with painless ROM and NVI.   Prudencio Burly III PA-C 03/27/2017 7:10 AM

## 2017-03-27 NOTE — Discharge Instructions (Signed)
Elevate leg as frequently as possible.    You may loosen ace wrap and re-apply if it feels too tight.  Diet: As you were doing prior to hospitalization   Shower:  You have a splint on, leave the splint in place and keep the splint dry with a plastic bag.  Dressing:  You have a splint, then just leave the splint in place and we will change your bandages during your first follow-up appointment.    Activity:  Increase activity slowly as tolerated, but follow the weight bearing instructions below.  The rules on driving is that you can not be taking narcotics while you drive, and you must feel in control of the vehicle.    Weight Bearing:  Non weight bearing Bilateral legs.  To prevent constipation: you may use a stool softener such as -  Colace (over the counter) 100 mg by mouth twice a day  Drink plenty of fluids (prune juice may be helpful) and high fiber foods Miralax (over the counter) for constipation as needed.    Itching:  If you experience itching with your medications, try taking only a single pain pill, or even half a pain pill at a time.  You may take up to 10 pain pills per day, and you can also use benadryl over the counter for itching or also to help with sleep.   Precautions:  If you experience chest pain or shortness of breath - call 911 immediately for transfer to the hospital emergency department!!  If you develop a fever greater that 101 F, purulent drainage from wound, increased redness or drainage from wound, or calf pain -- Call the office at [phone]                                                Follow- Up Appointment:  Please call for an appointment to be seen in 2 weeks [place] - [phone]

## 2017-03-27 NOTE — Op Note (Signed)
03/26/2017 - 03/27/2017  12:12 PM  PATIENT:  Nichole Delgado    PRE-OPERATIVE DIAGNOSIS:  bimalleolar ankle fracture left  POST-OPERATIVE DIAGNOSIS:  Same  PROCEDURE:  OPEN REDUCTION INTERNAL FIXATION (ORIF) BIMALLEOLAR ANKLE FRACTURE  SURGEON:  Sunil Hue, Ernesta Amble, MD  ASSISTANT: Roxan Hockey, PA-C, he was present and scrubbed throughout the case, critical for completion in a timely fashion, and for retraction, instrumentation, and closure.   ANESTHESIA:   gen  PREOPERATIVE INDICATIONS:  Nichole Delgado is a  66 y.o. female with a diagnosis of bimalleolar ankle fracture left who failed conservative measures and elected for surgical management.    The risks benefits and alternatives were discussed with the patient preoperatively including but not limited to the risks of infection, bleeding, nerve injury, cardiopulmonary complications, the need for revision surgery, among others, and the patient was willing to proceed.  OPERATIVE IMPLANTS: [employer]ker 1/3 tubular and canulated screws  OPERATIVE FINDINGS: Unstable ankle fracture. Stable syndesmosis post op  BLOOD LOSS: min  COMPLICATIONS: nonne  TOURNIQUET TIME: 24min  OPERATIVE PROCEDURE:  Patient was identified in the preoperative holding area and site was marked by me He was transported to the operating theater and placed on the table in supine position taking care to pad all bony prominences. After a preincinduction time out anesthesia was induced. The left lower lower extremity was prepped and draped in normal sterile fashion and a pre-incision timeout was performed. Nichole Delgado received ancef for preoperative antibiotics.   I made a lateral incision of roughly 7 cm dissection was carried down sharply to the distal fibula and then spreading dissection was used proximally to protect the superficial peroneal nerve. I sharply incised the periosteum and took care to protect the peroneal tendons. I then debrided the fracture  site and performed a reduction maneuver which was held in place with a clamp.   I placed a lag screw across the fracture through the plate  I then selected a 7-hole one third tubular plate and placed in a neutralization fashion care was taken distally so as not to penetrate the joint with the cancellus screws.  I then turned my attention medially where I created a 4 cm incision and dissected sharply down to the medial Mal fracture taking care to protect the saphenous vein. I debrided the fracture and reduced and held in place with a tenaculum. I then drilled and placed 2 partially threaded 45 mm cannulated screws one anterior and one posterior across the fracture.  I then stressed the syndesmosis and it was stable  I assesed the posterior mal piece and it was small enough to not require fixation as it involved less than 20% of the articular surface  The wound was then thoroughly irrigated and closed using a 0 Vicryl and absorbable Monocryl sutures. He was placed in a short leg splint.   POST OPERATIVE PLAN: Non-weightbearing. DVT prophylaxis will consist of chemical px

## 2017-03-28 ENCOUNTER — Encounter (HOSPITAL_COMMUNITY): Payer: Self-pay | Admitting: Orthopedic Surgery

## 2017-03-28 DIAGNOSIS — M51379 Other intervertebral disc degeneration, lumbosacral region without mention of lumbar back pain or lower extremity pain: Secondary | ICD-10-CM

## 2017-03-28 DIAGNOSIS — S82891D Other fracture of right lower leg, subsequent encounter for closed fracture with routine healing: Secondary | ICD-10-CM

## 2017-03-28 DIAGNOSIS — E46 Unspecified protein-calorie malnutrition: Secondary | ICD-10-CM

## 2017-03-28 DIAGNOSIS — S82892D Other fracture of left lower leg, subsequent encounter for closed fracture with routine healing: Secondary | ICD-10-CM

## 2017-03-28 DIAGNOSIS — D72829 Elevated white blood cell count, unspecified: Secondary | ICD-10-CM

## 2017-03-28 DIAGNOSIS — R519 Headache, unspecified: Secondary | ICD-10-CM

## 2017-03-28 DIAGNOSIS — M5137 Other intervertebral disc degeneration, lumbosacral region: Secondary | ICD-10-CM

## 2017-03-28 DIAGNOSIS — M21372 Foot drop, left foot: Secondary | ICD-10-CM

## 2017-03-28 DIAGNOSIS — E8809 Other disorders of plasma-protein metabolism, not elsewhere classified: Secondary | ICD-10-CM

## 2017-03-28 DIAGNOSIS — W19XXXD Unspecified fall, subsequent encounter: Secondary | ICD-10-CM

## 2017-03-28 DIAGNOSIS — K589 Irritable bowel syndrome without diarrhea: Secondary | ICD-10-CM

## 2017-03-28 DIAGNOSIS — E785 Hyperlipidemia, unspecified: Secondary | ICD-10-CM

## 2017-03-28 DIAGNOSIS — R51 Headache: Secondary | ICD-10-CM

## 2017-03-28 DIAGNOSIS — R42 Dizziness and giddiness: Secondary | ICD-10-CM

## 2017-03-28 LAB — COMPREHENSIVE METABOLIC PANEL
ALK PHOS: 45 U/L (ref 38–126)
ALT: 15 U/L (ref 14–54)
ANION GAP: 10 (ref 5–15)
AST: 24 U/L (ref 15–41)
Albumin: 3.3 g/dL — ABNORMAL LOW (ref 3.5–5.0)
BUN: 9 mg/dL (ref 6–20)
CALCIUM: 8.4 mg/dL — AB (ref 8.9–10.3)
CHLORIDE: 106 mmol/L (ref 101–111)
CO2: 27 mmol/L (ref 22–32)
Creatinine, Ser: 1.05 mg/dL — ABNORMAL HIGH (ref 0.44–1.00)
GFR, EST NON AFRICAN AMERICAN: 54 mL/min — AB (ref >60)
Glucose, Bld: 93 mg/dL (ref 65–99)
Potassium: 3.9 mmol/L (ref 3.5–5.1)
SODIUM: 143 mmol/L (ref 135–145)
Total Bilirubin: 0.4 mg/dL (ref 0.3–1.2)
Total Protein: 5.9 g/dL — ABNORMAL LOW (ref 6.5–8.1)

## 2017-03-28 LAB — CBC
HCT: 41.7 % (ref 36.0–46.0)
Hemoglobin: 13.5 g/dL (ref 12.0–15.0)
MCH: 30.2 pg (ref 26.0–34.0)
MCHC: 32.4 g/dL (ref 30.0–36.0)
MCV: 93.3 fL (ref 78.0–100.0)
PLATELETS: 181 10*3/uL (ref 150–400)
RBC: 4.47 MIL/uL (ref 3.87–5.11)
RDW: 13.6 % (ref 11.5–15.5)
WBC: 10.7 10*3/uL — ABNORMAL HIGH (ref 4.0–10.5)

## 2017-03-28 LAB — MAGNESIUM: MAGNESIUM: 1.8 mg/dL (ref 1.7–2.4)

## 2017-03-28 MED ORDER — ENSURE ENLIVE PO LIQD: 237.0000 mL | Freq: Every day | ORAL | Status: DC

## 2017-03-28 MED ORDER — MAGNESIUM OXIDE 400 (241.3 MG) MG PO TABS
400.0000 mg | ORAL_TABLET | Freq: Every day | ORAL | Status: DC
  Administered 2017-03-28 – 2017-03-30 (×3): 400 mg via ORAL
  Filled 2017-03-28 (×3): qty 1

## 2017-03-28 MED ORDER — POTASSIUM CHLORIDE CRYS ER 20 MEQ PO TBCR
40.0000 meq | EXTENDED_RELEASE_TABLET | Freq: Once | ORAL | Status: AC
  Administered 2017-03-28: 40 meq via ORAL
  Filled 2017-03-28: qty 2

## 2017-03-28 NOTE — Evaluation (Signed)
Physical Therapy Evaluation Patient Details Name: Nichole Delgado MRN: [id number] DOB: [birth year] Today's Date: 03/28/2017   History of Present Illness  66 yo female s/p fall ~10 feet from attic with OPEN REDUCTION INTERNAL FIXATION (ORIF) BIMALLEOLAR ANKLE FRACTURE (Left) 4/8 Dr Percell Miller and Right Fibula and calcaneous fracture pending ORIF.An incidental finding, Nodular consolidation within the left lung apex, measuring 8 mm, possibly semi-solid nodule  Clinical Impression   Patient is s/p above surgery resulting in functional limitations due to the deficits listed below (see PT Problem List). Ms. Schwier was independent prior to fall; participating well in PT and OT; Difficulty with "uphill" lateral scoot transfer from recliner back to bed; I believe with the training she would receive at [facility], Ms. Pikus can reach modified independent /supervision functional wheelchair level;  Patient will benefit from skilled PT to increase their independence and safety with mobility to allow discharge to the venue listed below.       Follow Up Recommendations CIR    Equipment Recommendations  Wheelchair (measurements PT);Wheelchair cushion (measurements PT);Other (comment) (will consider sliding board)    Recommendations for Other Services Rehab consult     Precautions / Restrictions Precautions Precautions: Fall Precaution Comments: NWB BIL LE  Restrictions Weight Bearing Restrictions: Yes RLE Weight Bearing: Non weight bearing LLE Weight Bearing: Non weight bearing      Mobility  Bed Mobility Overal bed mobility: Needs Assistance Bed Mobility: Sit to Supine     Supine to sit: Min guard Sit to supine: Min guard;Min assist   General bed mobility comments: Cues for technique; min assist to help LEs onto bed, one at a time  Transfers Overall transfer level: Needs assistance Equipment used: 2 person hand held assist Transfers: Lateral/Scoot Transfers          Lateral/Scoot  Transfers: Max assist General transfer comment: Provided demonstration of technqiue prior to working on transfer; Cues for technique and use of bed pad to laterally scoot back to bed towards pt's L side; Requiring more asist due to fatigue and going "uphill" back to bed this transfer  Ambulation/Gait                Stairs            Wheelchair Mobility    Modified Rankin (Stroke Patients Only)       Balance                                             Pertinent Vitals/Pain Pain Assessment: 0-10 Pain Score: 6  Pain Location: L ankle Pain Descriptors / Indicators: Throbbing Pain Intervention(s): Monitored during session    Home Living Family/patient expects to be discharged to:: Private residence Living Arrangements: Spouse/significant other Available Help at Discharge: Family;Available PRN/intermittently Type of Home: House (Condo) Home Access: Level entry     Home Layout: One level Home Equipment: Shower seat Additional Comments: daughter works full time and can only help when off from work. pt reports she like rehab but spouse and daughter are opposed to rehab at [facility] s time    Prior Function Level of Independence: Independent               Hand Dominance   Dominant Hand: Right    Extremity/Trunk Assessment   Upper Extremity Assessment Upper Extremity Assessment: Defer to OT evaluation    Lower Extremity Assessment Lower Extremity  Assessment: Generalized weakness;RLE deficits/detail;LLE deficits/detail RLE Deficits / Details: +active toe wiggle; noting some hip weakness with difficulty performing straight leg raise, or lifting leg onto bed LLE Deficits / Details: +active toe wiggle; noting some hip weakness with difficulty performing straight leg raise, or lifting leg onto bed    Cervical / Trunk Assessment Cervical / Trunk Assessment: Normal  Communication   Communication: No difficulties  Cognition Arousal/Alertness:  Awake/alert Behavior During Therapy: WFL for tasks assessed/performed Overall Cognitive Status: Impaired/Different from baseline Area of Impairment: Memory;Safety/judgement                     Memory: Decreased recall of precautions   Safety/Judgement: Decreased awareness of deficits     General Comments: Pt repeatly asking "is this okay? when moving BIL LE. they told me not to move it. " pt provided education multiple times during session on allowed movement and prohibited movement. Chart notes hx of mental illness but does not provided detail. Question baseline or new onset for delayed recall      General Comments General comments (skin integrity, edema, etc.): dressing dry and elevated. ICe on L ankle    Exercises     Assessment/Plan    PT Assessment Patient needs continued PT services  PT Problem List Decreased strength;Decreased range of motion;Decreased activity tolerance;Decreased balance;Decreased mobility;Decreased knowledge of use of DME;Decreased safety awareness;Decreased knowledge of precautions;Pain       PT Treatment Interventions DME instruction;Functional mobility training;Therapeutic activities;Therapeutic exercise;Balance training;Neuromuscular re-education;Cognitive remediation;Patient/family education;Wheelchair mobility training    PT Goals (Current goals can be found in the Care Plan section)  Acute Rehab PT Goals Patient Stated Goal: to go to rehab ( my husband and daughter are opposed to ) PT Goal Formulation: With patient Time For Goal Achievement: 04/11/17 Potential to Achieve Goals: Good Additional Goals Additional Goal #1: Pt will manage wc parts and propel wc with min cues for technique    Frequency Min 5X/week   Barriers to discharge        Co-evaluation               End of Session Equipment Utilized During Treatment: Other (comment) (bed pad) Activity Tolerance: Patient tolerated treatment well Patient left: in bed;with  call bell/phone within reach;Other (comment) (pt on bed pan) Nurse Communication: Mobility status;Other (comment) (and that pt is on bedpan) PT Visit Diagnosis: Other abnormalities of gait and mobility (R26.89);Pain Pain - Right/Left:  (Bilateral) Pain - part of body: Ankle and joints of foot    Time: 7989-2119 PT Time Calculation (min) (ACUTE ONLY): 12 min   Charges:   PT Evaluation $PT Eval Moderate Complexity: 1 Procedure     PT G Codes:        Roney Marion, PT  Acute Rehabilitation Services Pager [phone] Office [phone]   Colletta [place] 03/28/2017, 12:53 PM

## 2017-03-28 NOTE — Progress Notes (Signed)
Rehab Admissions Coordinator Note:  Patient was screened by Retta Diones for appropriateness for an Inpatient Acute Rehab Consult.  At this time, we are recommending Inpatient Rehab consult.  Retta Diones 03/28/2017, 11:10 AM  I can be reached at [phone].

## 2017-03-28 NOTE — Progress Notes (Signed)
Initial Nutrition Assessment  DOCUMENTATION CODES:   Not applicable  INTERVENTION:  Provide Ensure Enlive po once daily, each supplement provides 350 kcal and 20 grams of protein.  Encourage adequate PO intake.   NUTRITION DIAGNOSIS:   Increased nutrient needs related to wound healing as evidenced by estimated needs.  GOAL:   Patient will meet greater than or equal to 90% of their needs  MONITOR:   PO intake, Supplement acceptance, Labs, Weight trends, Skin, I & O's  REASON FOR ASSESSMENT:   Consult Assessment of nutrition requirement/status  ASSESSMENT:   66 y.o. female with medical history significant of tobacco abuse, hyperlipidemia, GERD, vertigo, headache, IBS, mental illness (patient does not know the diagnosis, taking perphenazine), who presents with fall, injury to both ankles and right foot. Bilateral ankle fracture.  PROCEDURE (4/8):  OPEN REDUCTION INTERNAL FIXATION (ORIF) BIMALLEOLAR ANKLE FRACTURE (Left)  Plans for surgery on R ankle once swelling goes down. Meal completion has been 100%. Pt reports having a good appetite currently and PTA with usual consumption of at least 2-3 meals a day with snacks in between. Weight has been stable. Pt is agreeable to nutritional supplements to aid in wound healing.  RD to order. Pt educated on the importance of protein on wound healing. Plans for CIR and transfer tomorrow.   Pt with no observed significant fat or muscle mass loss.   Labs and medications reviewed.   Diet Order:  Diet regular Room service appropriate? Yes; Fluid consistency: Thin  Skin:   (Incision on L ankle)  Last BM:  4/7  Height:   Ht Readings from Last 1 Encounters:  11/15/16 4\' 11"  (1.499 m)    Weight:   Wt Readings from Last 1 Encounters:  03/26/17 130 lb 15.3 oz (59.4 kg)    Ideal Body Weight:  44.5 kg  BMI:  Body mass index is 26.45 kg/m.  Estimated Nutritional Needs:   Kcal:  1600-1800  Protein:  70-80 grams  Fluid:  1.6 -  1.8 L/day  EDUCATION NEEDS:   Education needs addressed  Corrin Parker, MS, RD, LDN Pager # [phone] After hours/ weekend pager # [phone]

## 2017-03-28 NOTE — Progress Notes (Signed)
[facility] PROGRESS NOTE  Nichole Delgado GEX:[id number] DOB: September 25, 1951 DOA: 03/26/2017   PCP: Gerrit Heck, MD     Assessment/Plan: Active Problems:   Vertigo   HLD (hyperlipidemia)   Fall   Tobacco abuse   Right calcaneal fracture   Closed fracture of both ankles   IBS (irritable bowel syndrome)   GERD (gastroesophageal reflux disease)   66 y.o. female with medical history significant of tobacco abuse, hyperlipidemia, GERD, vertigo, headache, IBS, mental illness (patient does not know the diagnosis, taking perphenazine), who presents with fall, injury to both ankles and right foot. Bilateral ankle fracture. Orthopedics consulted  Assessment and plan  Fracture of bilateral ankle, and right calcaneal bone:  Left Medial and lateral maleolar fracture S/P ORIF 03/27/2017. Nonweightbearing. Orthopedic surgeon was consulted. Dr. Percell Miller   Continue telemetry given multiple antipsychotic medications Pain control: morphine prn and percocet When necessary Zofran for nausea Robaxin for muscle spasm PT/OT evaluation    Right Fibula and calcaneous fracture Nonweightbearing. As per orthopedics Plan for operative fixation after acute swelling has decreased-planning for 04/05/2017.   Leukocytosis: Likely due to stress-induced demargination. Patient does not have signs of infection. Leukocytosis improving Will check UA  Vertigo: -Continue when necessary meclizine  HLD (hyperlipidemia): -Continue pravastatin  IBS: No nausea, vomiting, abdominal pain or diarrhea -Continue home Viberzi  Fall: due to accident. No prodromal symptoms. -PT/OT when able to  GERD: -Protonix  Lung nodule: An incidental finding, Nodular consolidation within the left lung apex, measuring 8 mm, possibly semi-solid nodule, alternatively chronic scarring or atelectasis.  need to follow-up with PCP to get non-contrast CT at 3-6 months to confirm persistence.    DVT  prophylaxsis Lovenox starting tomorrow  Code Status:  Full code    Family Communication: Discussed in detail with the patient, all imaging results, lab results explained to the patient   Disposition Plan:  Anticipate discharge as per orthopedic recommendations     Consultants:  Orthopedics  Procedures:  NONE   Antibiotics: Anti-infectives    Start     Dose/Rate Route Frequency Ordered Stop   03/27/17 1430  ceFAZolin (ANCEF) IVPB 1 g/50 mL premix     1 g 100 mL/hr over 30 Minutes Intravenous Every 6 hours 03/27/17 1421 03/28/17 0447   03/27/17 1030  ceFAZolin (ANCEF) IVPB 2g/100 mL premix  Status:  Discontinued     2 g 200 mL/hr over 30 Minutes Intravenous On call to O.R. 03/27/17 1028 03/27/17 1037   03/27/17 1026  ceFAZolin (ANCEF) IVPB 2g/100 mL premix     2 g 200 mL/hr over 30 Minutes Intravenous To [facility] 03/26/17 2249 03/27/17 1120   03/27/17 0600  ceFAZolin (ANCEF) IVPB 2g/100 mL premix  Status:  Discontinued     2 g 200 mL/hr over 30 Minutes Intravenous To [facility] 03/26/17 2155 03/26/17 2249         HPI/Subjective: No diarrhea , pain under controlled   Objective: Vitals:   03/27/17 1419 03/27/17 2100 03/28/17 0031 03/28/17 0630  BP: (!) 113/46 (!) 118/51 (!) 121/59 124/60  Pulse: 86 80 89 91  Resp: 18 15 16    Temp: 98.8 F (37.1 C) 99 F (37.2 C) 98.6 F (37 C) 98.9 F (37.2 C)  TempSrc:  Oral Axillary Oral  SpO2: 96% 94% 93% 92%  Weight:        Intake/Output Summary (Last 24 hours) at 03/28/17 0948 Last data filed at 03/28/17 0900  Gross per 24 hour  Intake           2812.5 ml  Output              805 ml  Net           2007.5 ml      Examination:  General exam: Appears calm and comfortable  Respiratory system: Clear to auscultation. Respiratory effort normal. Cardiovascular system: S1 & S2 heard, RRR. No JVD, murmurs, rubs, gallops or clicks. No pedal edema. Gastrointestinal system: Abdomen is nondistended, soft  and nontender. No organomegaly or masses felt. Normal bowel sounds heard. Central nervous system: Alert and oriented. No focal neurological deficits. Extremities: Symmetric 5 x 5 power. Skin: No rashes, lesions or ulcers Psychiatry: Judgement and insight appear normal. Mood & affect appropriate.     Data Reviewed: I have personally reviewed following labs and imaging studies  Micro Results Recent Results (from the past 240 hour(s))  MRSA PCR Screening     Status: None   Collection Time: 03/27/17  4:31 AM  Result Value Ref Range Status   MRSA by PCR NEGATIVE NEGATIVE Final    Comment:        The GeneXpert MRSA Assay (FDA approved for NASAL specimens only), is one component of a comprehensive MRSA colonization surveillance program. It is not intended to diagnose MRSA infection nor to guide or monitor treatment for MRSA infections.     Radiology Reports Dg Lumbar Spine Complete  Result Date: 03/26/2017 CLINICAL DATA:  Pt fell from attic at 1 pm today. Pt c/o right heel and lateral ankle pain, left ankle pain with medial bruising and swelling, and lower back pain. Only previous sx is to right great toe. EXAM: LUMBAR SPINE - COMPLETE 4+ VIEW COMPARISON:  None. FINDINGS: Minimal scoliosis which may be positional in nature. Alignment is otherwise normal. No evidence of acute vertebral body subluxation. No fracture line or displaced fracture fragment seen. No compression fracture deformity. Minimal degenerative spurring within the upper and mid lumbar spine. No evidence of advanced degenerative change at any level. Facet joints appear intact and normally aligned. Upper sacrum appears intact and normally aligned. Paravertebral soft tissues are unremarkable. IMPRESSION: No acute findings. No fracture or acute subluxation within the lumbar spine. Electronically Signed   By: Franki Cabot M.D.   On: 03/26/2017 16:09   Dg Ankle Complete Left  Result Date: 03/26/2017 CLINICAL DATA:  Insert Clinic  EXAM: LEFT ANKLE COMPLETE - 3+ VIEW COMPARISON:  None. FINDINGS: Displaced fracture within the medial malleolus, with lateral displacement of the fractured medial malleolus, and medial displacement of the distal tibia relative to the underlying talus. Associated distortion of the ankle mortise. Additional displaced fracture within the upper portion of the lateral malleolus. O fracture seen within the talar dome. No fracture seen within the visualized portions of the hindfoot or midfoot. IMPRESSION: 1. Displaced fracture within the medial malleolus. Associated medial displacement of the distal tibia relative to the underlying talus. Associated distortion of the ankle mortise. 2. Additional displaced fracture within the distal fibula. Electronically Signed   By: Franki Cabot M.D.   On: 03/26/2017 16:08   Dg Ankle Complete Right  Result Date: 03/26/2017 CLINICAL DATA:  Pt fell from attic at 1 pm today. Pt c/o right heel and lateral ankle pain, left ankle pain with medial bruising and swelling, and lower back pain. Only previous sx is to right great toe. EXAM: RIGHT ANKLE - COMPLETE 3+ VIEW COMPARISON:  None. FINDINGS: Displaced/comminuted fracture within the distal  right fibula. Distal tibia appears intact and normally aligned. Ankle mortise is symmetric. Displaced/comminuted fractures within the posterior calcaneus. Probable associated diastases at the overlying subtalar joint spaces. IMPRESSION: 1. Displaced/comminuted fracture within the distal right fibula. 2. Displaced/comminuted fractures within the posterior calcaneus. Probable associated diastases at the overlying subtalar joint spaces. Consider CT for more definitive characterization of fracture extent and osseous alignment. Electronically Signed   By: Franki Cabot M.D.   On: 03/26/2017 16:05   Ct Head Wo Contrast  Result Date: 03/26/2017 CLINICAL DATA:  Status post fall. EXAM: CT HEAD WITHOUT CONTRAST CT CERVICAL SPINE WITHOUT CONTRAST TECHNIQUE:  Multidetector CT imaging of the head and cervical spine was performed following the standard protocol without intravenous contrast. Multiplanar CT image reconstructions of the cervical spine were also generated. COMPARISON:  None. FINDINGS: CT HEAD FINDINGS Brain: Ventricles are normal in size and configuration. There is no hemorrhage, edema or other evidence of acute parenchymal abnormality. No extra-axial hemorrhage. Vascular: No hyperdense vessel or unexpected calcification. Skull: Normal. Negative for fracture or focal lesion. Sinuses/Orbits: No acute finding. Other: Soft tissue edema/laceration overlying the posterior left parietal-occipital bone. No underlying fracture. Slight deformity of the left nasal bone, likely chronic. CT CERVICAL SPINE FINDINGS Alignment: Mild straightening of the normal cervical lordosis related to underlying degenerative change. No evidence of acute vertebral body subluxation. Skull base and vertebrae: No fracture line or displaced fracture fragment identified. Facet joints appear intact and normally aligned throughout. Soft tissues and spinal canal: No prevertebral fluid or swelling. No visible canal hematoma. Disc levels: Mild degenerative spurring throughout the mid and lower cervical spine, with associated mild disc-osteophytic bulges at the C3-4 through C6-7 levels. No more than mild central canal stenosis at any level. Upper chest: Nodular consolidation within the medial aspects of the left lung apex, measuring 8 mm (series 302, image 90), possibly a semi-solid nodule, too small to definitively characterize. Other: Subcentimeter hypodense focus within the right thyroid lobe, most likely a benign colloid nodule. Carotid atherosclerosis. IMPRESSION: 1. Soft tissue edema/laceration overlying the left posterior parietal-occipital bone. No underlying fracture. 2. No acute intracranial abnormality. No intracranial hemorrhage or edema. 3. No fracture or acute subluxation within the  cervical spine. Degenerative changes of the cervical spine, as detailed above. 4. **An incidental finding of potential clinical significance has been found. Nodular consolidation within the left lung apex, measuring 8 mm, possibly semi-solid nodule, alternatively chronic scarring or atelectasis. Follow-up non-contrast CT recommended at 3-6 months to confirm persistence. If unchanged, and solid component remains <6 mm, annual CT is recommended until 5 years of stability has been established. If persistent these nodules should be considered highly suspicious if the solid component of the nodule is 6 mm or greater in size and enlarging. This recommendation follows the consensus statement: Guidelines for Management of Incidental Pulmonary Nodules Detected on CT Images: From the Fleischner Society 2017; Radiology 2017; 284:228-243.** 5. Carotid atherosclerosis. Electronically Signed   By: Franki Cabot M.D.   On: 03/26/2017 17:28   Ct Cervical Spine Wo Contrast  Result Date: 03/26/2017 CLINICAL DATA:  Status post fall. EXAM: CT HEAD WITHOUT CONTRAST CT CERVICAL SPINE WITHOUT CONTRAST TECHNIQUE: Multidetector CT imaging of the head and cervical spine was performed following the standard protocol without intravenous contrast. Multiplanar CT image reconstructions of the cervical spine were also generated. COMPARISON:  None. FINDINGS: CT HEAD FINDINGS Brain: Ventricles are normal in size and configuration. There is no hemorrhage, edema or other evidence of acute parenchymal abnormality. No extra-axial  hemorrhage. Vascular: No hyperdense vessel or unexpected calcification. Skull: Normal. Negative for fracture or focal lesion. Sinuses/Orbits: No acute finding. Other: Soft tissue edema/laceration overlying the posterior left parietal-occipital bone. No underlying fracture. Slight deformity of the left nasal bone, likely chronic. CT CERVICAL SPINE FINDINGS Alignment: Mild straightening of the normal cervical lordosis related  to underlying degenerative change. No evidence of acute vertebral body subluxation. Skull base and vertebrae: No fracture line or displaced fracture fragment identified. Facet joints appear intact and normally aligned throughout. Soft tissues and spinal canal: No prevertebral fluid or swelling. No visible canal hematoma. Disc levels: Mild degenerative spurring throughout the mid and lower cervical spine, with associated mild disc-osteophytic bulges at the C3-4 through C6-7 levels. No more than mild central canal stenosis at any level. Upper chest: Nodular consolidation within the medial aspects of the left lung apex, measuring 8 mm (series 302, image 90), possibly a semi-solid nodule, too small to definitively characterize. Other: Subcentimeter hypodense focus within the right thyroid lobe, most likely a benign colloid nodule. Carotid atherosclerosis. IMPRESSION: 1. Soft tissue edema/laceration overlying the left posterior parietal-occipital bone. No underlying fracture. 2. No acute intracranial abnormality. No intracranial hemorrhage or edema. 3. No fracture or acute subluxation within the cervical spine. Degenerative changes of the cervical spine, as detailed above. 4. **An incidental finding of potential clinical significance has been found. Nodular consolidation within the left lung apex, measuring 8 mm, possibly semi-solid nodule, alternatively chronic scarring or atelectasis. Follow-up non-contrast CT recommended at 3-6 months to confirm persistence. If unchanged, and solid component remains <6 mm, annual CT is recommended until 5 years of stability has been established. If persistent these nodules should be considered highly suspicious if the solid component of the nodule is 6 mm or greater in size and enlarging. This recommendation follows the consensus statement: Guidelines for Management of Incidental Pulmonary Nodules Detected on CT Images: From the Fleischner Society 2017; Radiology 2017; 284:228-243.**  5. Carotid atherosclerosis. Electronically Signed   By: Franki Cabot M.D.   On: 03/26/2017 17:28   Ct Ankle Right Wo Contrast  Result Date: 03/26/2017 CLINICAL DATA:  Right ankle fracture after falling through attic floor landing on both feet. EXAM: CT OF THE RIGHT ANKLE WITHOUT CONTRAST TECHNIQUE: Multidetector CT imaging of the right ankle was performed according to the standard protocol. Multiplanar CT image reconstructions were also generated. COMPARISON:  03/26/2017 ankle radiographs FINDINGS: Bones/Joint/Cartilage 1. Closed, comminuted distal fibular diaphyseal fracture 2.5 cm from the ankle mortise. Slight lateral angulation of the main distal fracture fragment with buckling and 3 mm of lateral displacement of a small butterfly fracture fragment. 2. 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 No encroachment on the sinus tarsi. 3. Fracture along the long axis of the calcaneus is noted on the sagittal reformats through the body and posterior calcaneus with a coronal fracture fragment extending to the plantar surface off this fracture (series 201, image 17) and a second extending through the posterior calcaneus (series 204, image 24). 3 mm of offset is noted at the posterior subtalar joint (series 204, image 20). 4. Tiny ossific density off the plantar dorsal aspect of the cuboid may represent a small avulsion, age indeterminate given its slightly rounded margins. 5. The ankle mortise is maintained. No distal tibial fracture is seen. The midfoot articulations are appear congruent.  Ligaments Suboptimally assessed by CT. Muscles and Tendons Intact peroneal tendons. Intact Achilles. Intact extensor tendons crossing the ankle joint. Intact tibialis posterior and flexor digitorum longus tendons. The flexor hallucis is adjacent  to one of the fracture lines medially and there is potential for entrapment, series 202, image 64. Soft tissues Diffuse soft tissue swelling along the dorsal plantar aspect of the foot. IMPRESSION: 1. Acute, closed, distal fibular diaphyseal fracture. 2. 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 3. Age-indeterminate tiny ossific density off the plantar dorsal aspect of the cuboid. Electronically Signed   By: Ashley Royalty M.D.   On: 03/26/2017 18:08   Dg Ankle Left Port  Result Date: 03/26/2017 CLINICAL DATA:  Status post reduction of left ankle fracture. Initial encounter. EXAM: PORTABLE LEFT ANKLE - 2 VIEW COMPARISON:  Left ankle radiographs performed earlier today at 3:08 p.m. FINDINGS: There is improved alignment of the medial malleolar fracture. A minimally displaced distal fibular fracture is again noted. There is mild residual lateral tilt of the talus, with mild widening of the medial aspect of the ankle mortise. Mild surrounding soft tissue swelling is noted. A splint is noted about the lower leg and ankle. IMPRESSION: Improved alignment of the medial malleolar fracture. Minimally displaced distal fibular fracture again noted. Mild residual lateral tilt of the talus, with mild widening of the medial aspect of the ankle mortise. Electronically Signed   By: Garald Balding M.D.   On: 03/26/2017 20:04   Dg Foot Complete Left  Result Date: 03/26/2017 CLINICAL DATA:  [facility] EXAM: LEFT FOOT - COMPLETE 3+ VIEW COMPARISON:  None. FINDINGS: No fracture line or displaced fracture fragment seen within the hindfoot, midfoot or forefoot. Displaced fractures of the medial malleolus and lateral malleolus are better demonstrated on the dedicated ankle exam. IMPRESSION: Negative left foot. Electronically Signed   By: Franki Cabot M.D.   On: 03/26/2017 16:10   Dg Foot  Complete Right  Result Date: 03/26/2017 CLINICAL DATA:  Pt fell from attic at 1 pm today. Pt c/o right heel and lateral ankle pain, left ankle pain with medial bruising and swelling, and lower back pain. Only previous sx is to right great toe. EXAM: RIGHT FOOT COMPLETE - 3+ VIEW COMPARISON:  None. FINDINGS: Displaced/comminuted fractures within the posterior calcaneus. Probable associated diastases at the overlying subtalar joint spaces. IMPRESSION: Displaced/comminuted fractures within the posterior calcaneus. Probable associated diastases at the overlying subtalar joint spaces. Consider CT for more definitive characterization of fracture extent and osseous alignment. Electronically Signed   By: Franki Cabot M.D.   On: 03/26/2017 16:04     CBC  Recent Labs Lab 03/26/17 1559 03/27/17 0232 03/28/17 0353  WBC 18.5* 11.1* 10.7*  HGB 15.5* 13.5 13.5  HCT 45.7 40.6 41.7  PLT 224 224 181  MCV 90.3 90.8 93.3  MCH 30.6 30.2 30.2  MCHC 33.9 33.3 32.4  RDW 13.1 13.2 13.6  LYMPHSABS 2.3  --   --   MONOABS 0.9  --   --   EOSABS 0.0  --   --   BASOSABS 0.1  --   --     Chemistries   Recent Labs Lab 03/26/17 1559 03/27/17 0232 03/28/17 0353  NA 139 140 143  K 4.2 3.7 3.9  CL 105 109 106  CO2 23 25 27   GLUCOSE 106* 149* 93  BUN  14 13 9   CREATININE 0.89 0.98 1.05*  CALCIUM 9.5 8.5* 8.4*  AST  --   --  24  ALT  --   --  15  ALKPHOS  --   --  45  BILITOT  --   --  0.4   ------------------------------------------------------------------------------------------------------------------ estimated creatinine clearance is 40.8 mL/min (A) (by C-G formula based on SCr of 1.05 mg/dL (H)). ------------------------------------------------------------------------------------------------------------------ No results for input(s): HGBA1C in the last 72 hours. ------------------------------------------------------------------------------------------------------------------ No results for input(s):  CHOL, HDL, LDLCALC, TRIG, CHOLHDL, LDLDIRECT in the last 72 hours. ------------------------------------------------------------------------------------------------------------------ No results for input(s): TSH, T4TOTAL, T3FREE, THYROIDAB in the last 72 hours.  Invalid input(s): FREET3 ------------------------------------------------------------------------------------------------------------------ No results for input(s): VITAMINB12, FOLATE, FERRITIN, TIBC, IRON, RETICCTPCT in the last 72 hours.  Coagulation profile  Recent Labs Lab 03/26/17 2100  INR 0.94    No results for input(s): DDIMER in the last 72 hours.  Cardiac Enzymes No results for input(s): CKMB, TROPONINI, MYOGLOBIN in the last 168 hours.  Invalid input(s): CK ------------------------------------------------------------------------------------------------------------------ Invalid input(s): POCBNP   CBG: No results for input(s): GLUCAP in the last 168 hours.     Studies: Dg Lumbar Spine Complete  Result Date: 03/26/2017 CLINICAL DATA:  Pt fell from attic at 1 pm today. Pt c/o right heel and lateral ankle pain, left ankle pain with medial bruising and swelling, and lower back pain. Only previous sx is to right great toe. EXAM: LUMBAR SPINE - COMPLETE 4+ VIEW COMPARISON:  None. FINDINGS: Minimal scoliosis which may be positional in nature. Alignment is otherwise normal. No evidence of acute vertebral body subluxation. No fracture line or displaced fracture fragment seen. No compression fracture deformity. Minimal degenerative spurring within the upper and mid lumbar spine. No evidence of advanced degenerative change at any level. Facet joints appear intact and normally aligned. Upper sacrum appears intact and normally aligned. Paravertebral soft tissues are unremarkable. IMPRESSION: No acute findings. No fracture or acute subluxation within the lumbar spine. Electronically Signed   By: Franki Cabot M.D.   On:  03/26/2017 16:09   Dg Ankle Complete Left  Result Date: 03/26/2017 CLINICAL DATA:  Insert Clinic EXAM: LEFT ANKLE COMPLETE - 3+ VIEW COMPARISON:  None. FINDINGS: Displaced fracture within the medial malleolus, with lateral displacement of the fractured medial malleolus, and medial displacement of the distal tibia relative to the underlying talus. Associated distortion of the ankle mortise. Additional displaced fracture within the upper portion of the lateral malleolus. O fracture seen within the talar dome. No fracture seen within the visualized portions of the hindfoot or midfoot. IMPRESSION: 1. Displaced fracture within the medial malleolus. Associated medial displacement of the distal tibia relative to the underlying talus. Associated distortion of the ankle mortise. 2. Additional displaced fracture within the distal fibula. Electronically Signed   By: Franki Cabot M.D.   On: 03/26/2017 16:08   Dg Ankle Complete Right  Result Date: 03/26/2017 CLINICAL DATA:  Pt fell from attic at 1 pm today. Pt c/o right heel and lateral ankle pain, left ankle pain with medial bruising and swelling, and lower back pain. Only previous sx is to right great toe. EXAM: RIGHT ANKLE - COMPLETE 3+ VIEW COMPARISON:  None. FINDINGS: Displaced/comminuted fracture within the distal right fibula. Distal tibia appears intact and normally aligned. Ankle mortise is symmetric. Displaced/comminuted fractures within the posterior calcaneus. Probable associated diastases at the overlying subtalar joint spaces. IMPRESSION: 1. Displaced/comminuted fracture within the distal right fibula. 2. Displaced/comminuted fractures within the posterior calcaneus. Probable associated diastases  at the overlying subtalar joint spaces. Consider CT for more definitive characterization of fracture extent and osseous alignment. Electronically Signed   By: Franki Cabot M.D.   On: 03/26/2017 16:05   Ct Head Wo Contrast  Result Date: 03/26/2017 CLINICAL DATA:   Status post fall. EXAM: CT HEAD WITHOUT CONTRAST CT CERVICAL SPINE WITHOUT CONTRAST TECHNIQUE: Multidetector CT imaging of the head and cervical spine was performed following the standard protocol without intravenous contrast. Multiplanar CT image reconstructions of the cervical spine were also generated. COMPARISON:  None. FINDINGS: CT HEAD FINDINGS Brain: Ventricles are normal in size and configuration. There is no hemorrhage, edema or other evidence of acute parenchymal abnormality. No extra-axial hemorrhage. Vascular: No hyperdense vessel or unexpected calcification. Skull: Normal. Negative for fracture or focal lesion. Sinuses/Orbits: No acute finding. Other: Soft tissue edema/laceration overlying the posterior left parietal-occipital bone. No underlying fracture. Slight deformity of the left nasal bone, likely chronic. CT CERVICAL SPINE FINDINGS Alignment: Mild straightening of the normal cervical lordosis related to underlying degenerative change. No evidence of acute vertebral body subluxation. Skull base and vertebrae: No fracture line or displaced fracture fragment identified. Facet joints appear intact and normally aligned throughout. Soft tissues and spinal canal: No prevertebral fluid or swelling. No visible canal hematoma. Disc levels: Mild degenerative spurring throughout the mid and lower cervical spine, with associated mild disc-osteophytic bulges at the C3-4 through C6-7 levels. No more than mild central canal stenosis at any level. Upper chest: Nodular consolidation within the medial aspects of the left lung apex, measuring 8 mm (series 302, image 90), possibly a semi-solid nodule, too small to definitively characterize. Other: Subcentimeter hypodense focus within the right thyroid lobe, most likely a benign colloid nodule. Carotid atherosclerosis. IMPRESSION: 1. Soft tissue edema/laceration overlying the left posterior parietal-occipital bone. No underlying fracture. 2. No acute intracranial  abnormality. No intracranial hemorrhage or edema. 3. No fracture or acute subluxation within the cervical spine. Degenerative changes of the cervical spine, as detailed above. 4. **An incidental finding of potential clinical significance has been found. Nodular consolidation within the left lung apex, measuring 8 mm, possibly semi-solid nodule, alternatively chronic scarring or atelectasis. Follow-up non-contrast CT recommended at 3-6 months to confirm persistence. If unchanged, and solid component remains <6 mm, annual CT is recommended until 5 years of stability has been established. If persistent these nodules should be considered highly suspicious if the solid component of the nodule is 6 mm or greater in size and enlarging. This recommendation follows the consensus statement: Guidelines for Management of Incidental Pulmonary Nodules Detected on CT Images: From the Fleischner Society 2017; Radiology 2017; 284:228-243.** 5. Carotid atherosclerosis. Electronically Signed   By: Franki Cabot M.D.   On: 03/26/2017 17:28   Ct Cervical Spine Wo Contrast  Result Date: 03/26/2017 CLINICAL DATA:  Status post fall. EXAM: CT HEAD WITHOUT CONTRAST CT CERVICAL SPINE WITHOUT CONTRAST TECHNIQUE: Multidetector CT imaging of the head and cervical spine was performed following the standard protocol without intravenous contrast. Multiplanar CT image reconstructions of the cervical spine were also generated. COMPARISON:  None. FINDINGS: CT HEAD FINDINGS Brain: Ventricles are normal in size and configuration. There is no hemorrhage, edema or other evidence of acute parenchymal abnormality. No extra-axial hemorrhage. Vascular: No hyperdense vessel or unexpected calcification. Skull: Normal. Negative for fracture or focal lesion. Sinuses/Orbits: No acute finding. Other: Soft tissue edema/laceration overlying the posterior left parietal-occipital bone. No underlying fracture. Slight deformity of the left nasal bone, likely chronic.  CT CERVICAL SPINE FINDINGS  Alignment: Mild straightening of the normal cervical lordosis related to underlying degenerative change. No evidence of acute vertebral body subluxation. Skull base and vertebrae: No fracture line or displaced fracture fragment identified. Facet joints appear intact and normally aligned throughout. Soft tissues and spinal canal: No prevertebral fluid or swelling. No visible canal hematoma. Disc levels: Mild degenerative spurring throughout the mid and lower cervical spine, with associated mild disc-osteophytic bulges at the C3-4 through C6-7 levels. No more than mild central canal stenosis at any level. Upper chest: Nodular consolidation within the medial aspects of the left lung apex, measuring 8 mm (series 302, image 90), possibly a semi-solid nodule, too small to definitively characterize. Other: Subcentimeter hypodense focus within the right thyroid lobe, most likely a benign colloid nodule. Carotid atherosclerosis. IMPRESSION: 1. Soft tissue edema/laceration overlying the left posterior parietal-occipital bone. No underlying fracture. 2. No acute intracranial abnormality. No intracranial hemorrhage or edema. 3. No fracture or acute subluxation within the cervical spine. Degenerative changes of the cervical spine, as detailed above. 4. **An incidental finding of potential clinical significance has been found. Nodular consolidation within the left lung apex, measuring 8 mm, possibly semi-solid nodule, alternatively chronic scarring or atelectasis. Follow-up non-contrast CT recommended at 3-6 months to confirm persistence. If unchanged, and solid component remains <6 mm, annual CT is recommended until 5 years of stability has been established. If persistent these nodules should be considered highly suspicious if the solid component of the nodule is 6 mm or greater in size and enlarging. This recommendation follows the consensus statement: Guidelines for Management of Incidental Pulmonary  Nodules Detected on CT Images: From the Fleischner Society 2017; Radiology 2017; 284:228-243.** 5. Carotid atherosclerosis. Electronically Signed   By: Franki Cabot M.D.   On: 03/26/2017 17:28   Ct Ankle Right Wo Contrast  Result Date: 03/26/2017 CLINICAL DATA:  Right ankle fracture after falling through attic floor landing on both feet. EXAM: CT OF THE RIGHT ANKLE WITHOUT CONTRAST TECHNIQUE: Multidetector CT imaging of the right ankle was performed according to the standard protocol. Multiplanar CT image reconstructions were also generated. COMPARISON:  03/26/2017 ankle radiographs FINDINGS: Bones/Joint/Cartilage 1. Closed, comminuted distal fibular diaphyseal fracture 2.5 cm from the ankle mortise. Slight lateral angulation of the main distal fracture fragment with buckling and 3 mm of lateral displacement of a small butterfly fracture fragment. 2. 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 No encroachment on the sinus tarsi. 3. Fracture along the long axis of the calcaneus is noted on the sagittal reformats through the body and posterior calcaneus with a coronal fracture fragment extending to the plantar surface off this fracture (series 201, image 17) and a second extending through the posterior calcaneus (series 204, image 24). 3 mm of offset is noted at the posterior subtalar joint (series 204, image 20). 4. Tiny ossific density off the plantar dorsal aspect of the cuboid may represent a small avulsion, age indeterminate given its slightly rounded margins. 5. The ankle mortise is maintained. No distal tibial fracture is seen. The midfoot articulations are appear congruent. Ligaments Suboptimally assessed by CT. Muscles and Tendons Intact peroneal tendons. Intact Achilles. Intact extensor tendons crossing the ankle joint.  Intact tibialis posterior and flexor digitorum longus tendons. The flexor hallucis is adjacent to one of the fracture lines medially and there is potential for entrapment,  series 202, image 64. Soft tissues Diffuse soft tissue swelling along the dorsal plantar aspect of the foot. IMPRESSION: 1. Acute, closed, distal fibular diaphyseal fracture. 2. 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 3. Age-indeterminate tiny ossific density off the plantar dorsal aspect of the cuboid. Electronically Signed   By: Ashley Royalty M.D.   On: 03/26/2017 18:08   Dg Ankle Left Port  Result Date: 03/26/2017 CLINICAL DATA:  Status post reduction of left ankle fracture. Initial encounter. EXAM: PORTABLE LEFT ANKLE - 2 VIEW COMPARISON:  Left ankle radiographs performed earlier today at 3:08 p.m. FINDINGS: There is improved alignment of the medial malleolar fracture. A minimally displaced distal fibular fracture is again noted. There is mild residual lateral tilt of the talus, with mild widening of the medial aspect of the ankle mortise. Mild surrounding soft tissue swelling is noted. A splint is noted about the lower leg and ankle. IMPRESSION: Improved alignment of the medial malleolar fracture. Minimally displaced distal fibular fracture again noted. Mild residual lateral tilt of the talus, with mild widening of the medial aspect of the ankle mortise. Electronically Signed   By: Garald Balding M.D.   On: 03/26/2017 20:04   Dg Foot Complete Left  Result Date: 03/26/2017 CLINICAL DATA:  [facility] EXAM: LEFT FOOT - COMPLETE 3+ VIEW COMPARISON:  None. FINDINGS: No fracture line or displaced fracture fragment seen within the hindfoot, midfoot or forefoot. Displaced fractures of the medial malleolus and lateral malleolus are better demonstrated on the dedicated ankle exam. IMPRESSION: Negative  left foot. Electronically Signed   By: Franki Cabot M.D.   On: 03/26/2017 16:10   Dg Foot Complete Right  Result Date: 03/26/2017 CLINICAL DATA:  Pt fell from attic at 1 pm today. Pt c/o right heel and lateral ankle pain, left ankle pain with medial bruising and swelling, and lower back pain. Only previous sx is to right great toe. EXAM: RIGHT FOOT COMPLETE - 3+ VIEW COMPARISON:  None. FINDINGS: Displaced/comminuted fractures within the posterior calcaneus. Probable associated diastases at the overlying subtalar joint spaces. IMPRESSION: Displaced/comminuted fractures within the posterior calcaneus. Probable associated diastases at the overlying subtalar joint spaces. Consider CT for more definitive characterization of fracture extent and osseous alignment. Electronically Signed   By: Franki Cabot M.D.   On: 03/26/2017 16:04      No results found for: HGBA1C Lab Results  Component Value Date   CREATININE 1.05 (H) 03/28/2017       Scheduled Meds: . acetaminophen  650 mg Oral Q6H  . docusate sodium  100 mg Oral BID  . Eluxadoline  75 mg Oral BID  . enoxaparin (LOVENOX) injection  40 mg Subcutaneous Q24H  . fluticasone  2 spray Each Nare Daily  . ipratropium  2 spray Each Nare BID  . ketorolac  7.5 mg Intravenous Q6H  . nicotine  21 mg Transdermal Daily  . pantoprazole  40 mg Oral Daily  . perphenazine  4 mg Oral QHS  . pravastatin  40 mg Oral QHS  . senna  1 tablet Oral BID   Continuous Infusions: . lactated ringers 75 mL/hr at 03/27/17 1518     LOS: 2 days    Time spent: >30 MINS    Reyne Dumas  [facility] Pager [phone]. If 7PM-7AM, please contact night-coverage at [email or web address], password [facility] 03/28/2017, 9:48 AM  LOS:  2 days

## 2017-03-28 NOTE — Evaluation (Signed)
Occupational Therapy Evaluation Patient Details Name: Nichole Delgado MRN: [id number] DOB: April 18, 1951 Today's Date: 03/28/2017    History of Present Illness 66 yo female s/p fall ~10 feet from attic with OPEN REDUCTION INTERNAL FIXATION (ORIF) BIMALLEOLAR ANKLE FRACTURE (Left) 4/8 Dr Percell Miller and Right Fibula and calcaneous fracture pending ORIF.An incidental finding, Nodular consolidation within the left lung apex, measuring 8 mm, possibly semi-solid nodule   Clinical Impression   Patient is s/p L ORIF ankle surgery resulting in functional limitations due to the deficits listed below (see OT problem list). PTA was independent with all adls. Pt reports she like rehab but her spouse and daughter are opposed. Pt also reports spouse is 102 yo and broke hip 2 years ago. The daughter works full time and "a lot of hours" is the way the patient described her schedule. Pt agreeable at this time. Pt currently requires (A) for transfers due to inability to lift BIL LE for lateral transfers.  Pt did complete full adl in bathroom in recliner this session with cues for positioning and sequence. Patient will benefit from skilled OT acutely to increase independence and safety with ADLS to allow discharge CIR.     Follow Up Recommendations  CIR    Equipment Recommendations  3 in 1 bedside commode;Wheelchair (measurements OT);Wheelchair cushion (measurements OT) (drop arm 3n1)    Recommendations for Other Services Rehab consult     Precautions / Restrictions Precautions Precautions: Fall Precaution Comments: NWB BIL LE  Restrictions Weight Bearing Restrictions: Yes RLE Weight Bearing: Non weight bearing LLE Weight Bearing: Non weight bearing      Mobility Bed Mobility Overal bed mobility: Needs Assistance Bed Mobility: Supine to Sit     Supine to sit: Min guard     General bed mobility comments: using bil UE to push up into sitting  Transfers Overall transfer level: Needs  assistance Equipment used: 1 person hand held assist Transfers: Lateral/Scoot Transfers          Lateral/Scoot Transfers: Mod assist General transfer comment: pt sliding R side with therapist moving BIL LE onto the chair surface. pt provided mod cues for safety. Pt up in chair with BIL LE elevated    Balance                                           ADL either performed or assessed with clinical judgement   ADL Overall ADL's : Needs assistance/impaired Eating/Feeding: Independent   Grooming: Wash/dry hands;Wash/dry face;Applying deodorant;Minimal assistance;Sitting Grooming Details (indicate cue type and reason): pt in recliner in bathroom at sink level. pt needed basin due to BIL LE elevated and cues on lateral lean. Pt needed (A) to elevate LE for peri care Upper Body Bathing: Modified independent;Sitting   Lower Body Bathing: Minimal assistance;Sitting/lateral leans Lower Body Bathing Details (indicate cue type and reason): needs pillow between knees to help with positioning Upper Body Dressing : Minimal assistance Upper Body Dressing Details (indicate cue type and reason): don gown due to lines / leads   Lower Body Dressing Details (indicate cue type and reason): na at this time. Pt asking to don underwear and advised to wait due to mobility at this ttime and required use of bed pan   Toilet Transfer Details (indicate cue type and reason): called portal for drop arm chair           General ADL  Comments: Pt transfering to the R from bed to drop arm chair with (A) MOD for bil LE      Vision         Perception     Praxis      Pertinent Vitals/Pain Pain Assessment: 0-10 Pain Score: 4  Pain Location: L ankle Pain Descriptors / Indicators: Throbbing Pain Intervention(s): Monitored during session;Premedicated before session;Repositioned;Ice applied     Hand Dominance Right   Extremity/Trunk Assessment Upper Extremity Assessment Upper Extremity  Assessment: Overall WFL for tasks assessed   Lower Extremity Assessment Lower Extremity Assessment: Defer to PT evaluation   Cervical / Trunk Assessment Cervical / Trunk Assessment: Normal   Communication Communication Communication: No difficulties   Cognition Arousal/Alertness: Awake/alert Behavior During Therapy: WFL for tasks assessed/performed Overall Cognitive Status: Impaired/Different from baseline Area of Impairment: Memory;Safety/judgement                     Memory: Decreased recall of precautions   Safety/Judgement: Decreased awareness of deficits     General Comments: Pt repeatly asking "is this okay? when moving BIL LE. they told me not to move it. " pt provided education multiple times during session on allowed movement and prohibited movement. Chart notes hx of mental illness but does not provided detail. Question baseline or new onset for delayed recall   General Comments  dressing dry and elevated. ICe on L ankle    Exercises     Shoulder Instructions      Home Living Family/patient expects to be discharged to:: Private residence Living Arrangements: Spouse/significant other Available Help at Discharge: Family;Available PRN/intermittently Type of Home: House (Condo) Home Access: Level entry     Home Layout: One level     Bathroom Shower/Tub: Occupational psychologist: Standard     Home Equipment: Shower seat   Additional Comments: daughter works full time and can only help when off from work. pt reports she like rehab but spouse and daughter are opposed to rehab at [facility] time      Prior Functioning/Environment Level of Independence: Independent                 OT Problem List: Decreased strength;Decreased activity tolerance;Impaired balance (sitting and/or standing);Decreased safety awareness;Decreased knowledge of use of DME or AE;Decreased knowledge of precautions;Pain      OT Treatment/Interventions: Self-care/ADL  training;Therapeutic exercise;Energy conservation;DME and/or AE instruction;Therapeutic activities;Patient/family education;Balance training;Cognitive remediation/compensation    OT Goals(Current goals can be found in the care plan section) Acute Rehab OT Goals Patient Stated Goal: to go to rehab ( my husband and daughter are opposed to ) OT Goal Formulation: With patient Time For Goal Achievement: 04/11/17 Potential to Achieve Goals: Good ADL Goals Pt Will Perform Grooming: with modified independence;sitting Pt Will Perform Lower Body Bathing: with modified independence;sitting/lateral leans Pt Will Transfer to Toilet: with supervision;with transfer board;bedside commode (drop arm chair) Additional ADL Goal #1: pt will complete bed to w/c transfer supervision level as precursor to adls.   OT Frequency: Min 2X/week   Barriers to D/C:            Co-evaluation              End of Session Equipment Utilized During Treatment: Oxygen Nurse Communication: Mobility status;Precautions;Weight bearing status  Activity Tolerance: Patient tolerated treatment well Patient left: in chair;with call bell/phone within reach;with chair alarm set  OT Visit Diagnosis: Unsteadiness on feet (R26.81)  Time: 1115-5208 OT Time Calculation (min): 52 min Charges:  OT General Charges $OT Visit: 1 Procedure OT Evaluation $OT Eval Moderate Complexity: 1 Procedure OT Treatments $Self Care/Home Management : 23-37 mins G-Codes:      Jeri Modena   OTR/L Pager: [phone] Office: [phone] .   Parke Poisson B 03/28/2017, 9:32 AM

## 2017-03-28 NOTE — Progress Notes (Signed)
   Assessment / Plan: 1 Day Post-Op  S/P Procedure(s) (LRB): OPEN REDUCTION INTERNAL FIXATION (ORIF) BIMALLEOLAR ANKLE FRACTURE (Left) by Dr. Ernesta Amble. Percell Miller on 03/27/2017  Active Problems:   Vertigo   HLD (hyperlipidemia)   Fall   Tobacco abuse   Right calcaneal fracture   Closed fracture of both ankles   IBS (irritable bowel syndrome)   GERD (gastroesophageal reflux disease)  Left Medial and lateral maleolar fracture S/P ORIF 03/27/2017. Nonweightbearing.  Right Fibula and calcaneous fracture Nonweightbearing. Plan for operative fixation after acute swelling has decreased-planning for 04/05/2017.  Incentive spirometer Elevate legs to reduce pain and swelling Therapy evaluation  Weight Bearing: Non Weight Bearing (NWB)  Dressings: PRN.  VTE prophylaxis: Lovenox Dispo: TBD.  May go to SNF or stay inpatient until next planned ORIF Right Ankle / Calcaneous 04/05/2017 depending on progress, pain control, support. Her husband is 19 and may not be able to provide significant assistance.  Subjective: Patient reports pain as moderate. Pain controlled with IV and PO meds.  Tolerating diet.  Urinating.    Objective:   VITALS:   Vitals:   03/27/17 1419 03/27/17 2100 03/28/17 0031 03/28/17 0630  BP: (!) 113/46 (!) 118/51 (!) 121/59 124/60  Pulse: 86 80 89 91  Resp: 18 15 16    Temp: 98.8 F (37.1 C) 99 F (37.2 C) 98.6 F (37 C) 98.9 F (37.2 C)  TempSrc:  Oral Axillary Oral  SpO2: 96% 94% 93% 92%  Weight:       CBC Latest Ref Rng & Units 03/28/2017 03/27/2017 03/26/2017  WBC 4.0 - 10.5 K/uL 10.7(H) 11.1(H) 18.5(H)  Hemoglobin 12.0 - 15.0 g/dL 13.5 13.5 15.5(H)  Hematocrit 36.0 - 46.0 % 41.7 40.6 45.7  Platelets 150 - 400 K/uL 181 224 224   BMP Latest Ref Rng & Units 03/28/2017 03/27/2017 03/26/2017  Glucose 65 - 99 mg/dL 93 149(H) 106(H)  BUN 6 - 20 mg/dL 9 13 14   Creatinine 0.44 - 1.00 mg/dL 1.05(H) 0.98 0.89  BUN/Creat Ratio 12 - 28 - - -  Sodium 135 - 145 mmol/L 143 140  139  Potassium 3.5 - 5.1 mmol/L 3.9 3.7 4.2  Chloride 101 - 111 mmol/L 106 109 105  CO2 22 - 32 mmol/L 27 25 23   Calcium 8.9 - 10.3 mg/dL 8.4(L) 8.5(L) 9.5   Intake/Output      04/08 0701 - 04/09 0700 04/09 0701 - 04/10 0700   I.V. (mL/kg) 2232.5 (37.6)    IV Piggyback 100    Total Intake(mL/kg) 2332.5 (39.3)    Urine (mL/kg/hr) 400 (0.3)    Blood 5 (0)    Total Output 405     Net +1927.5          Urine Occurrence 1 x      Physical Exam: General: NAD.  Supine in bed. Conversant. MSK Bilateral ankles splinted. Toes warm. EHL FHL intact , painless. Sensation intact distally  Prudencio Burly III, PA-C 03/28/2017, 7:40 AM

## 2017-03-28 NOTE — Consult Note (Signed)
Physical Medicine and Rehabilitation Consult   Reason for Consult: Bilateral ankle fracture and mild concussion  Referring Physician: Dr. Allyson Sabal   HPI: Nichole Delgado is a 66 y.o. female with history of headaches, DDD with recent left foot drop,  episodic vertigo, IBS-D; who fell 10 feet out of attic on 03/26/17, landed feet first and fell backwards striking the back of her head. She reported severe bilateral ankle and right heel pain. X rays done revealing displaced comminuted fracture right fibular and posterior calcaneus and displaced fracture of left medial malleolus and addition displaced fracture within distal fibular. She underwent ORIF bilateral bimalleolar ankle fracture by Dr. Percell Miller on 4/8 and is to be NWB BLE. Plans for right fibular and calcaneous fracture approx 4/17 after edema decreases. Reactive leucocytosis resolving and incidental findings of upper chest nodule. CT head reviewed, no acute abnormalities.  Per report, CT head and neck with soft tissue edema left posterior parietal -occipital lobe.   Pain relatively controlled and PT/OT evaluations done today. Patient with substantial decline in mobility and ability to carry out ADL tasks as well as difficulty with processing. CIR recommended for follow up therapy.   She was able to sit up in a chair for 2 hours today. Husband reports that she had confusion earlier that's resolved but still with delayed processing. He would prefer rehab in hospital rather than d/c to SNF. He can provide supervision after discharge.    Review of Systems  HENT: Negative for hearing loss and tinnitus.   Eyes: Negative for blurred vision and double vision.  Respiratory: Negative for cough and shortness of breath.   Cardiovascular: Negative for chest pain and palpitations.  Gastrointestinal: Positive for diarrhea. Negative for heartburn and nausea.  Genitourinary: Negative for dysuria and urgency.  Musculoskeletal: Positive for back pain (DDD  with left foot droop) and neck pain.  Neurological: Positive for dizziness, focal weakness (recent left drop dx 1 month) and weakness.  Psychiatric/Behavioral: Negative for depression and memory loss. The patient is not nervous/anxious and does not have insomnia.   All other systems reviewed and are negative.     Past Medical History:  Diagnosis Date  . Anxiety   . Cervical spondylosis   . HLD (hyperlipidemia)   . IBS (irritable bowel syndrome)   . Migraines   . Tobacco abuse   . Vertigo 07/07/2016    Past Surgical History:  Procedure Laterality Date  . CATARACT EXTRACTION Bilateral   . FOOT SURGERY  2011  . ORIF ANKLE FRACTURE Left 03/27/2017   Procedure: OPEN REDUCTION INTERNAL FIXATION (ORIF) BIMALLEOLAR ANKLE FRACTURE;  Surgeon: Renette Butters, MD;  Location: [facility];  Service: Orthopedics;  Laterality: Left;  . PARTIAL HYSTERECTOMY  1988    Family History  Problem Relation Age of Onset  . Heart attack Father   . Migraines Mother   . Cancer Sister     Pancreatic  . Cancer Brother     Liver  . Migraines Daughter     Social History:   Married.  Retired--worked in Journalist, newspaper. Independent without AD prior to admission. She  reports that she has been smoking 1 PPD.  She has a 40 pack-year smoking history. She has never used smokeless tobacco. She reports that she does not drink alcohol or use drugs.    Allergies  Allergen Reactions  . Codeine Nausea Only  . Penicillins   . Sulfa Antibiotics Rash    Medications Prior to Admission  Medication  Sig Dispense Refill  . Eluxadoline (VIBERZI) 75 MG TABS Take 75 mg by mouth 2 (two) times daily.    . fluticasone (FLONASE) 50 MCG/ACT nasal spray Place 2 sprays into both nostrils daily.     . meclizine (ANTIVERT) 25 MG tablet Take 25 mg by mouth daily as needed.     . meloxicam (MOBIC) 7.5 MG tablet Take 7.5 mg by mouth daily.     Marland Kitchen omeprazole (PRILOSEC) 40 MG capsule Take 40 mg by mouth daily as needed (indigestion).     Marland Kitchen  perphenazine (TRILAFON) 4 MG tablet Take 4 mg by mouth at bedtime.    . pravastatin (PRAVACHOL) 40 MG tablet Take 40 mg by mouth at bedtime.     . SUMAtriptan Succinate (IMITREX PO) Take by mouth daily as needed.    . traMADol (ULTRAM) 50 MG tablet Take 1 tablet (50 mg total) by mouth every 6 (six) hours as needed. 30 tablet 1  . zolpidem (AMBIEN) 10 MG tablet Take 10 mg by mouth at bedtime as needed for sleep.     Marland Kitchen ALPRAZolam (XANAX) 0.5 MG tablet Take 2 tablets approximately 45 minutes prior to the MRI study, take a third tablet if needed. (Patient not taking: Reported on 03/26/2017) 3 tablet 0  . ipratropium (ATROVENT) 0.03 % nasal spray Place 2 sprays into the nose 2 (two) times daily. 30 mL 5  . rizatriptan (MAXALT-MLT) 10 MG disintegrating tablet Take 1 tablet (10 mg total) by mouth 3 (three) times daily as needed for migraine. (Patient not taking: Reported on 03/26/2017) 9 tablet 11    Home: Home Living Family/patient expects to be discharged to:: Private residence Living Arrangements: Spouse/significant other Available Help at Discharge: Family, Available PRN/intermittently Type of Home: House (Condo) Home Access: Level entry Home Layout: One level Bathroom Shower/Tub: Multimedia programmer: Standard Home Equipment: Shower seat Additional Comments: daughter works full time and can only help when off from work. pt reports she like rehab but spouse and daughter are opposed to rehab at [facility] s time  Functional History: Prior Function Level of Independence: Independent Functional Status:  Mobility: Bed Mobility Overal bed mobility: Needs Assistance Bed Mobility: Supine to Sit Supine to sit: Min guard General bed mobility comments: using bil UE to push up into sitting Transfers Overall transfer level: Needs assistance Equipment used: 1 person hand held assist Transfers: Lateral/Scoot Transfers  Lateral/Scoot Transfers: Mod assist General transfer comment: pt sliding R side  with therapist moving BIL LE onto the chair surface. pt provided mod cues for safety. Pt up in chair with BIL LE elevated      ADL: ADL Overall ADL's : Needs assistance/impaired Eating/Feeding: Independent Grooming: Wash/dry hands, Wash/dry face, Applying deodorant, Minimal assistance, Sitting Grooming Details (indicate cue type and reason): pt in recliner in bathroom at sink level. pt needed basin due to BIL LE elevated and cues on lateral lean. Pt needed (A) to elevate LE for peri care Upper Body Bathing: Modified independent, Sitting Lower Body Bathing: Minimal assistance, Sitting/lateral leans Lower Body Bathing Details (indicate cue type and reason): needs pillow between knees to help with positioning Upper Body Dressing : Minimal assistance Upper Body Dressing Details (indicate cue type and reason): don gown due to lines / leads Lower Body Dressing Details (indicate cue type and reason): na at this time. Pt asking to don underwear and advised to wait due to mobility at this ttime and required use of bed pan Toilet Transfer Details (indicate cue type and  reason): called portal for drop arm chair General ADL Comments: Pt transfering to the R from bed to drop arm chair with (A) MOD for bil LE   Cognition: Cognition Overall Cognitive Status: Impaired/Different from baseline Orientation Level: Oriented X4 Cognition Arousal/Alertness: Awake/alert Behavior During Therapy: WFL for tasks assessed/performed Overall Cognitive Status: Impaired/Different from baseline Area of Impairment: Memory, Safety/judgement Memory: Decreased recall of precautions Safety/Judgement: Decreased awareness of deficits General Comments: Pt repeatly asking "is this okay? when moving BIL LE. they told me not to move it. " pt provided education multiple times during session on allowed movement and prohibited movement. Chart notes hx of mental illness but does not provided detail. Question baseline or new onset for  delayed recall   Blood pressure 124/60, pulse 91, temperature 98.9 F (37.2 C), temperature source Oral, resp. rate 16, weight 59.4 kg (130 lb 15.3 oz), SpO2 92 %. Physical Exam  Nursing note and vitals reviewed. Constitutional: She is oriented to person, place, and time. She appears well-developed and well-nourished.  HENT:  Head: Normocephalic and atraumatic.  Mouth/Throat: Oropharynx is clear and moist.  Eyes: Conjunctivae and EOM are normal. Pupils are equal, round, and reactive to light.  Neck: Normal range of motion. Neck supple.  Cardiovascular: Normal rate and regular rhythm.   Respiratory: Effort normal and breath sounds normal. No stridor. No respiratory distress. She has no wheezes.  GI: Soft. Bowel sounds are normal. She exhibits no distension. There is no tenderness.  Musculoskeletal: She exhibits edema and tenderness.  BLE with splints in place--NV intact.   Neurological: She is alert and oriented to person, place, and time.  Motor: B/l UE: 5/5 proximal to distal B/l LE: HF 2+/5, KE 3/5, (ankles dressed), wiggles toes Sensation intact to light touch  Skin: Skin is warm and dry.  LE dressed distally  Psychiatric: She has a normal mood and affect. Her behavior is normal. Thought content normal.    Results for orders placed or performed during the hospital encounter of 03/26/17 (from the past 24 hour(s))  Urinalysis, Routine w reflex microscopic     Status: Abnormal   Collection Time: 03/27/17  5:44 PM  Result Value Ref Range   Color, Urine YELLOW YELLOW   APPearance HAZY (A) CLEAR   Specific Gravity, Urine 1.014 1.005 - 1.030   pH 5.0 5.0 - 8.0   Glucose, UA NEGATIVE NEGATIVE mg/dL   Hgb urine dipstick NEGATIVE NEGATIVE   Bilirubin Urine NEGATIVE NEGATIVE   Ketones, ur NEGATIVE NEGATIVE mg/dL   Protein, ur NEGATIVE NEGATIVE mg/dL   Nitrite NEGATIVE NEGATIVE   Leukocytes, UA NEGATIVE NEGATIVE  CBC     Status: Abnormal   Collection Time: 03/28/17  3:53 AM  Result  Value Ref Range   WBC 10.7 (H) 4.0 - 10.5 K/uL   RBC 4.47 3.87 - 5.11 MIL/uL   Hemoglobin 13.5 12.0 - 15.0 g/dL   HCT 41.7 36.0 - 46.0 %   MCV 93.3 78.0 - 100.0 fL   MCH 30.2 26.0 - 34.0 pg   MCHC 32.4 30.0 - 36.0 g/dL   RDW 13.6 11.5 - 15.5 %   Platelets 181 150 - 400 K/uL  Comprehensive metabolic panel     Status: Abnormal   Collection Time: 03/28/17  3:53 AM  Result Value Ref Range   Sodium 143 135 - 145 mmol/L   Potassium 3.9 3.5 - 5.1 mmol/L   Chloride 106 101 - 111 mmol/L   CO2 27 22 - 32 mmol/L   Glucose,  Bld 93 65 - 99 mg/dL   BUN 9 6 - 20 mg/dL   Creatinine, Ser 1.05 (H) 0.44 - 1.00 mg/dL   Calcium 8.4 (L) 8.9 - 10.3 mg/dL   Total Protein 5.9 (L) 6.5 - 8.1 g/dL   Albumin 3.3 (L) 3.5 - 5.0 g/dL   AST 24 15 - 41 U/L   ALT 15 14 - 54 U/L   Alkaline Phosphatase 45 38 - 126 U/L   Total Bilirubin 0.4 0.3 - 1.2 mg/dL   GFR calc non Af Amer 54 (L) >60 mL/min   GFR calc Af Amer >60 >60 mL/min   Anion gap 10 5 - 15  Magnesium     Status: None   Collection Time: 03/28/17 10:01 AM  Result Value Ref Range   Magnesium 1.8 1.7 - 2.4 mg/dL   Dg Lumbar Spine Complete  Result Date: 03/26/2017 CLINICAL DATA:  Pt fell from attic at 1 pm today. Pt c/o right heel and lateral ankle pain, left ankle pain with medial bruising and swelling, and lower back pain. Only previous sx is to right great toe. EXAM: LUMBAR SPINE - COMPLETE 4+ VIEW COMPARISON:  None. FINDINGS: Minimal scoliosis which may be positional in nature. Alignment is otherwise normal. No evidence of acute vertebral body subluxation. No fracture line or displaced fracture fragment seen. No compression fracture deformity. Minimal degenerative spurring within the upper and mid lumbar spine. No evidence of advanced degenerative change at any level. Facet joints appear intact and normally aligned. Upper sacrum appears intact and normally aligned. Paravertebral soft tissues are unremarkable. IMPRESSION: No acute findings. No fracture or  acute subluxation within the lumbar spine. Electronically Signed   By: Franki Cabot M.D.   On: 03/26/2017 16:09   Dg Ankle Complete Left  Result Date: 03/26/2017 CLINICAL DATA:  Insert Clinic EXAM: LEFT ANKLE COMPLETE - 3+ VIEW COMPARISON:  None. FINDINGS: Displaced fracture within the medial malleolus, with lateral displacement of the fractured medial malleolus, and medial displacement of the distal tibia relative to the underlying talus. Associated distortion of the ankle mortise. Additional displaced fracture within the upper portion of the lateral malleolus. O fracture seen within the talar dome. No fracture seen within the visualized portions of the hindfoot or midfoot. IMPRESSION: 1. Displaced fracture within the medial malleolus. Associated medial displacement of the distal tibia relative to the underlying talus. Associated distortion of the ankle mortise. 2. Additional displaced fracture within the distal fibula. Electronically Signed   By: Franki Cabot M.D.   On: 03/26/2017 16:08   Dg Ankle Complete Right  Result Date: 03/26/2017 CLINICAL DATA:  Pt fell from attic at 1 pm today. Pt c/o right heel and lateral ankle pain, left ankle pain with medial bruising and swelling, and lower back pain. Only previous sx is to right great toe. EXAM: RIGHT ANKLE - COMPLETE 3+ VIEW COMPARISON:  None. FINDINGS: Displaced/comminuted fracture within the distal right fibula. Distal tibia appears intact and normally aligned. Ankle mortise is symmetric. Displaced/comminuted fractures within the posterior calcaneus. Probable associated diastases at the overlying subtalar joint spaces. IMPRESSION: 1. Displaced/comminuted fracture within the distal right fibula. 2. Displaced/comminuted fractures within the posterior calcaneus. Probable associated diastases at the overlying subtalar joint spaces. Consider CT for more definitive characterization of fracture extent and osseous alignment. Electronically Signed   By: Franki Cabot M.D.   On: 03/26/2017 16:05   Ct Head Wo Contrast  Result Date: 03/26/2017 CLINICAL DATA:  Status post fall. EXAM: CT HEAD WITHOUT  CONTRAST CT CERVICAL SPINE WITHOUT CONTRAST TECHNIQUE: Multidetector CT imaging of the head and cervical spine was performed following the standard protocol without intravenous contrast. Multiplanar CT image reconstructions of the cervical spine were also generated. COMPARISON:  None. FINDINGS: CT HEAD FINDINGS Brain: Ventricles are normal in size and configuration. There is no hemorrhage, edema or other evidence of acute parenchymal abnormality. No extra-axial hemorrhage. Vascular: No hyperdense vessel or unexpected calcification. Skull: Normal. Negative for fracture or focal lesion. Sinuses/Orbits: No acute finding. Other: Soft tissue edema/laceration overlying the posterior left parietal-occipital bone. No underlying fracture. Slight deformity of the left nasal bone, likely chronic. CT CERVICAL SPINE FINDINGS Alignment: Mild straightening of the normal cervical lordosis related to underlying degenerative change. No evidence of acute vertebral body subluxation. Skull base and vertebrae: No fracture line or displaced fracture fragment identified. Facet joints appear intact and normally aligned throughout. Soft tissues and spinal canal: No prevertebral fluid or swelling. No visible canal hematoma. Disc levels: Mild degenerative spurring throughout the mid and lower cervical spine, with associated mild disc-osteophytic bulges at the C3-4 through C6-7 levels. No more than mild central canal stenosis at any level. Upper chest: Nodular consolidation within the medial aspects of the left lung apex, measuring 8 mm (series 302, image 90), possibly a semi-solid nodule, too small to definitively characterize. Other: Subcentimeter hypodense focus within the right thyroid lobe, most likely a benign colloid nodule. Carotid atherosclerosis. IMPRESSION: 1. Soft tissue edema/laceration  overlying the left posterior parietal-occipital bone. No underlying fracture. 2. No acute intracranial abnormality. No intracranial hemorrhage or edema. 3. No fracture or acute subluxation within the cervical spine. Degenerative changes of the cervical spine, as detailed above. 4. **An incidental finding of potential clinical significance has been found. Nodular consolidation within the left lung apex, measuring 8 mm, possibly semi-solid nodule, alternatively chronic scarring or atelectasis. Follow-up non-contrast CT recommended at 3-6 months to confirm persistence. If unchanged, and solid component remains <6 mm, annual CT is recommended until 5 years of stability has been established. If persistent these nodules should be considered highly suspicious if the solid component of the nodule is 6 mm or greater in size and enlarging. This recommendation follows the consensus statement: Guidelines for Management of Incidental Pulmonary Nodules Detected on CT Images: From the Fleischner Society 2017; Radiology 2017; 284:228-243.** 5. Carotid atherosclerosis. Electronically Signed   By: Franki Cabot M.D.   On: 03/26/2017 17:28   Ct Cervical Spine Wo Contrast  Result Date: 03/26/2017 CLINICAL DATA:  Status post fall. EXAM: CT HEAD WITHOUT CONTRAST CT CERVICAL SPINE WITHOUT CONTRAST TECHNIQUE: Multidetector CT imaging of the head and cervical spine was performed following the standard protocol without intravenous contrast. Multiplanar CT image reconstructions of the cervical spine were also generated. COMPARISON:  None. FINDINGS: CT HEAD FINDINGS Brain: Ventricles are normal in size and configuration. There is no hemorrhage, edema or other evidence of acute parenchymal abnormality. No extra-axial hemorrhage. Vascular: No hyperdense vessel or unexpected calcification. Skull: Normal. Negative for fracture or focal lesion. Sinuses/Orbits: No acute finding. Other: Soft tissue edema/laceration overlying the posterior left  parietal-occipital bone. No underlying fracture. Slight deformity of the left nasal bone, likely chronic. CT CERVICAL SPINE FINDINGS Alignment: Mild straightening of the normal cervical lordosis related to underlying degenerative change. No evidence of acute vertebral body subluxation. Skull base and vertebrae: No fracture line or displaced fracture fragment identified. Facet joints appear intact and normally aligned throughout. Soft tissues and spinal canal: No prevertebral fluid or swelling. No visible canal  hematoma. Disc levels: Mild degenerative spurring throughout the mid and lower cervical spine, with associated mild disc-osteophytic bulges at the C3-4 through C6-7 levels. No more than mild central canal stenosis at any level. Upper chest: Nodular consolidation within the medial aspects of the left lung apex, measuring 8 mm (series 302, image 90), possibly a semi-solid nodule, too small to definitively characterize. Other: Subcentimeter hypodense focus within the right thyroid lobe, most likely a benign colloid nodule. Carotid atherosclerosis. IMPRESSION: 1. Soft tissue edema/laceration overlying the left posterior parietal-occipital bone. No underlying fracture. 2. No acute intracranial abnormality. No intracranial hemorrhage or edema. 3. No fracture or acute subluxation within the cervical spine. Degenerative changes of the cervical spine, as detailed above. 4. **An incidental finding of potential clinical significance has been found. Nodular consolidation within the left lung apex, measuring 8 mm, possibly semi-solid nodule, alternatively chronic scarring or atelectasis. Follow-up non-contrast CT recommended at 3-6 months to confirm persistence. If unchanged, and solid component remains <6 mm, annual CT is recommended until 5 years of stability has been established. If persistent these nodules should be considered highly suspicious if the solid component of the nodule is 6 mm or greater in size and  enlarging. This recommendation follows the consensus statement: Guidelines for Management of Incidental Pulmonary Nodules Detected on CT Images: From the Fleischner Society 2017; Radiology 2017; 284:228-243.** 5. Carotid atherosclerosis. Electronically Signed   By: Franki Cabot M.D.   On: 03/26/2017 17:28   Ct Ankle Right Wo Contrast  Result Date: 03/26/2017 CLINICAL DATA:  Right ankle fracture after falling through attic floor landing on both feet. EXAM: CT OF THE RIGHT ANKLE WITHOUT CONTRAST TECHNIQUE: Multidetector CT imaging of the right ankle was performed according to the standard protocol. Multiplanar CT image reconstructions were also generated. COMPARISON:  03/26/2017 ankle radiographs FINDINGS: Bones/Joint/Cartilage 1. Closed, comminuted distal fibular diaphyseal fracture 2.5 cm from the ankle mortise. Slight lateral angulation of the main distal fracture fragment with buckling and 3 mm of lateral displacement of a small butterfly fracture fragment. 2. 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 No encroachment on the sinus tarsi. 3. Fracture along the long axis of the calcaneus is noted on the sagittal reformats through the body and posterior calcaneus with a coronal fracture fragment extending to the plantar surface off this fracture (series 201, image 17) and a second extending through the posterior calcaneus (series 204, image 24). 3 mm of offset is noted at the posterior subtalar joint (series 204, image 20). 4. Tiny ossific density off the plantar dorsal aspect of the cuboid may represent a small avulsion, age indeterminate given its slightly rounded margins. 5. The ankle mortise is maintained. No distal tibial fracture is seen. The midfoot articulations are appear congruent. Ligaments Suboptimally assessed by  CT. Muscles and Tendons Intact peroneal tendons. Intact Achilles. Intact extensor tendons crossing the ankle joint. Intact tibialis posterior and flexor digitorum longus tendons. The flexor hallucis is adjacent to one of the fracture lines medially and there is potential for entrapment, series 202, image 64. Soft tissues Diffuse soft tissue swelling along the dorsal plantar aspect of the foot. IMPRESSION: 1. Acute, closed, distal fibular diaphyseal fracture. 2. 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 3. Age-indeterminate tiny ossific density off the plantar dorsal aspect of the cuboid. Electronically Signed   By: Ashley Royalty M.D.   On: 03/26/2017 18:08   Dg Ankle Left Port  Result Date: 03/26/2017 CLINICAL DATA:  Status post reduction of left ankle fracture. Initial encounter. EXAM: PORTABLE LEFT ANKLE - 2 VIEW COMPARISON:  Left ankle radiographs performed earlier today at 3:08 p.m. FINDINGS: There is improved alignment of the medial malleolar fracture. A minimally displaced distal fibular fracture is again noted. There is mild residual lateral tilt of the talus, with mild widening of the medial aspect of the ankle mortise. Mild surrounding soft tissue swelling is noted. A splint is noted about the lower leg and ankle. IMPRESSION: Improved alignment of the medial malleolar fracture. Minimally displaced distal fibular fracture again noted. Mild residual lateral tilt of the talus, with mild widening of the medial aspect of the ankle mortise. Electronically Signed   By: Garald Balding M.D.   On: 03/26/2017 20:04   Dg Foot Complete Left  Result Date: 03/26/2017 CLINICAL DATA:  [facility] EXAM: LEFT FOOT - COMPLETE 3+ VIEW COMPARISON:  None. FINDINGS: No fracture line or displaced fracture fragment seen within the hindfoot, midfoot or forefoot. Displaced fractures of  the medial malleolus and lateral malleolus are better demonstrated on the dedicated ankle exam. IMPRESSION: Negative left foot. Electronically Signed   By: Franki Cabot M.D.   On: 03/26/2017 16:10   Dg Foot Complete Right  Result Date: 03/26/2017 CLINICAL DATA:  Pt fell from attic at 1 pm today. Pt c/o right heel and lateral ankle pain, left ankle pain with medial bruising and swelling, and lower back pain. Only previous sx is to right great toe. EXAM: RIGHT FOOT COMPLETE - 3+ VIEW COMPARISON:  None. FINDINGS: Displaced/comminuted fractures within the posterior calcaneus. Probable associated diastases at the overlying subtalar joint spaces. IMPRESSION: Displaced/comminuted fractures within the posterior calcaneus. Probable associated diastases at the overlying subtalar joint spaces. Consider CT for more definitive characterization of fracture extent and osseous alignment. Electronically Signed   By: Franki Cabot M.D.   On: 03/26/2017 16:04    Assessment/Plan: Diagnosis: Bilateral LE fractures Labs and images independently reviewed.  Records reviewed and summated above.  1. Does the need for close, 24 hr/day medical supervision in concert with the patient's rehab needs make it unreasonable for this patient to be served in a less intensive setting? Yes 2. Co-Morbidities requiring supervision/potential complications: headaches (cont meds), DDD with recent left foot drop (Biofeedback training with therapies to help reduce reliance on opiate pain medications, monitor pain control during therapies, and sedation at rest and titrate to maximum efficacy to ensure participation and gains in therapies), episodic vertigo, IBS, hypoalbuminemia (maximize nutrition for overall health and wound healing), leukocytosis (cont to monitor for signs and symptoms of infection, further workup if indicated) 3. Due to bladder management, safety, skin/wound care, disease management, pain management and patient education, does  the patient require 24 hr/day rehab nursing? Yes 4. Does the patient require coordinated care of a physician, rehab nurse, PT (1-2 hrs/day, 5 days/week) and OT (1-2 hrs/day, 5 days/week) to address physical and functional deficits in the context of the above medical diagnosis(es)? Yes Addressing deficits in the following areas: balance, endurance, locomotion, strength, transferring, bowel/bladder control, bathing, dressing, toileting and psychosocial support 5. Can the patient actively participate in an intensive therapy program of at least 3 hrs of therapy per day at least 5 days per week?  Yes 6. The potential for patient to make measurable gains while on inpatient rehab is excellent 7. Anticipated functional outcomes upon discharge from inpatient rehab are Mod I at wheelchair level  with PT, Mod I at wheelchair level with OT, n/a with SLP. 8. Estimated rehab length of stay to reach the above functional goals is: 10-14 days. 9. Does the patient have adequate social supports and living environment to accommodate these discharge functional goals? Yes 10. Anticipated D/C setting: Home 11. Anticipated post D/C treatments: HH therapy and Home excercise program 12. Overall Rehab/Functional Prognosis: excellent  RECOMMENDATIONS: This patient's condition is appropriate for continued rehabilitative care in the following setting: CIR Patient has agreed to participate in recommended program. Yes Note that insurance prior authorization may be required for reimbursement for recommended care.  Comment: Rehab Admissions Coordinator to follow up.  Delice Lesch, MD, [place], [place] 03/28/2017

## 2017-03-28 NOTE — Progress Notes (Signed)
  MEDICATION RELATED CONSULT NOTE - INITIAL   Pharmacy Consult to evaluate current medications for potential QTc prolonging effects.  Allergies  Allergen Reactions  . Codeine Nausea Only  . Penicillins   . Sulfa Antibiotics Rash     Medical History: Past Medical History:  Diagnosis Date  . Anxiety   . HLD (hyperlipidemia)   . IBS (irritable bowel syndrome)   . Tobacco abuse   . Vertigo 07/07/2016   Labs: K 3.9, Mg 1.8 this AM  Assessment: 65yo pt admitted after fall and fracture of ankle and foot, s/p ORIF on 4/8.  Have been asked to evaluated medications for QTc prolonging properties.  QTc on today's EKG was 440.  Current medications have been assessed and the following may prolong QTc-  Benadryl prn (x 0 in last 24hr) Ondansetron prn (x 2 in last 24hr)  Pantoprazole qday (note H2 blockers may do this as well) Perphenazine qHS- may cause "EKG changes"  Plan:  Consider supplementing with KCl 108mEq po x 1 (goal > 4)  and MgOx 400mg  po x 1. Consider changing Protonix to Sucralfate D/C Diphenhydramine as has not been requiring No other good options for nausea/vomiting, but QTc < 500 today.    Gracy Bruins, PharmD Clinical Pharmacist [facility]

## 2017-03-29 ENCOUNTER — Encounter (HOSPITAL_COMMUNITY): Payer: Self-pay | Admitting: General Practice

## 2017-03-29 ENCOUNTER — Inpatient Hospital Stay (HOSPITAL_COMMUNITY): Payer: Medicare Other

## 2017-03-29 MED ORDER — BUPIVACAINE-EPINEPHRINE (PF) 0.5% -1:200000 IJ SOLN
INTRAMUSCULAR | Status: DC | PRN
  Administered 2017-03-27: 45 mL

## 2017-03-29 MED ORDER — MEPIVACAINE HCL 1.5 % IJ SOLN
INTRAMUSCULAR | Status: DC | PRN
  Administered 2017-03-27: 15 mL via PERINEURAL

## 2017-03-29 NOTE — Clinical Social Work Placement (Signed)
   CLINICAL SOCIAL WORK PLACEMENT  NOTE  Date:  03/29/2017  Patient Details  Name: Nichole Delgado MRN: [id number] Date of Birth: December 27, 1950  Clinical Social Work is seeking post-discharge placement for this patient at the [place] level of care (*CSW will initial, date and re-position this form in  chart as items are completed):  Yes   Patient/family provided with [facility] Work Department's list of facilities offering this level of care within the geographic area requested by the patient (or if unable, by the patient's family).  Yes   Patient/family informed of their freedom to choose among providers that offer the needed level of care, that participate in Medicare, Medicaid or managed care program needed by the patient, have an available bed and are willing to accept the patient.  Yes   Patient/family informed of [facility]'s ownership interest in [facility] and [facility], as well as of the fact that they are under no obligation to receive care at these facilities.  PASRR submitted to EDS on 03/29/17     PASRR number received on 03/29/17     Existing PASRR number confirmed on       FL2 transmitted to all facilities in geographic area requested by pt/family on 03/29/17     FL2 transmitted to all facilities within larger geographic area on       Patient informed that his/her managed care company has contracts with or will negotiate with certain facilities, including the following:            Patient/family informed of bed offers received.  Patient chooses bed at       Physician recommends and patient chooses bed at      Patient to be transferred to   on  .  Patient to be transferred to facility by       Patient family notified on   of transfer.  Name of family member notified:        PHYSICIAN Please sign FL2     Additional Comment:    _______________________________________________ Lilly Cove, LCSW 03/29/2017,  2:26 PM

## 2017-03-29 NOTE — NC FL2 (Signed)
[place] LEVEL OF CARE SCREENING TOOL     IDENTIFICATION  Patient Name: Nichole Delgado Birthdate: August 18, 1951 Sex: female Admission Date (Current Location): 03/26/2017  [facility] and [place] Number:  Herbalist and Address:  [facility] [place] [address] [place], [place] [address]      Provider Number: [id number]  Attending Physician Name and Address:  Reyne Dumas, MD  Relative Name and Phone Number:       Current Level of Care: Hospital Recommended Level of Care: [place] Prior Approval Number:    Date Approved/Denied:   PASRR Number:   [id number]  Discharge Plan: SNF    Current Diagnoses: Patient Active Problem List   Diagnosis Date Noted  . Bilateral headaches   . DDD (degenerative disc disease), lumbosacral   . Left foot drop   . Hypoalbuminemia due to protein-calorie malnutrition ([place])   . Leukocytosis   . Fall 03/26/2017  . Right calcaneal fracture 03/26/2017  . GERD (gastroesophageal reflux disease) 03/26/2017  . HLD (hyperlipidemia)   . Tobacco abuse   . Closed fracture of both ankles   . IBS (irritable bowel syndrome)   . Vertigo 07/07/2016    Orientation RESPIRATION BLADDER Height & Weight     Self, Time, Situation, Place  Normal Continent Weight: 130 lb 15.3 oz (59.4 kg) Height:     BEHAVIORAL SYMPTOMS/MOOD NEUROLOGICAL BOWEL NUTRITION STATUS      Continent Diet (see DC summary)  Provide Ensure Enlive po once daily, each supplement provides 350 kcal and 20 grams of protein.  Encourage adequate PO intake.  AMBULATORY STATUS COMMUNICATION OF NEEDS Skin   Extensive Assist Verbally Surgical wounds, Skin abrasions, Bruising                       Personal Care Assistance Level of Assistance  Bathing, Feeding, Dressing Bathing Assistance: Limited assistance Feeding assistance: Independent Dressing Assistance: Limited assistance     Functional Limitations Info  Sight,  Hearing, Speech Sight Info: Adequate Hearing Info: Adequate Speech Info: Adequate    SPECIAL CARE FACTORS FREQUENCY  PT (By licensed PT), OT (By licensed OT)     PT Frequency: 5x OT Frequency: 5x            Contractures Contractures Info: Not present    Additional Factors Info  Code Status, Allergies Code Status Info: Full Code Allergies Info:  Codeine, Penicillins, Sulfa Antibiotics           Current Medications (03/29/2017):  This is the current hospital active medication list Current Facility-Administered Medications  Medication Dose Route Frequency Provider Last Rate Last Dose  . acetaminophen (TYLENOL) tablet 650 mg  650 mg Oral Q6H PRN Ivor Costa, MD      . ALPRAZolam Duanne Moron) tablet 0.25 mg  0.25 mg Oral BID PRN Ivor Costa, MD      . docusate sodium (COLACE) capsule 100 mg  100 mg Oral BID Charna Elizabeth Martensen III, PA-C   100 mg at 03/29/17 0847  . Eluxadoline TABS 75 mg  75 mg Oral BID Ivor Costa, MD   75 mg at 03/29/17 0846  . enoxaparin (LOVENOX) injection 40 mg  40 mg Subcutaneous Q24H Charna Elizabeth Martensen III, PA-C   40 mg at 03/29/17 0003  . feeding supplement (ENSURE ENLIVE) (ENSURE ENLIVE) liquid 237 mL  237 mL Oral Q1500 Reyne Dumas, MD      . fluticasone (FLONASE) 50 MCG/ACT nasal spray 2  spray  2 spray Each Nare Daily Ivor Costa, MD      . ipratropium (ATROVENT) 0.06 % nasal spray 2 spray  2 spray Each Nare BID Ivor Costa, MD      . lactated ringers infusion   Intravenous Continuous Charna Elizabeth Martensen III, PA-C 75 mL/hr at 03/27/17 1518    . magnesium citrate solution 1 Bottle  1 Bottle Oral Once PRN Charna Elizabeth Martensen III, PA-C      . magnesium oxide (MAG-OX) tablet 400 mg  400 mg Oral Daily Reyne Dumas, MD   400 mg at 03/29/17 0847  . meclizine (ANTIVERT) tablet 25 mg  25 mg Oral Daily PRN Ivor Costa, MD      . methocarbamol (ROBAXIN) tablet 500 mg  500 mg Oral Q8H PRN Ivor Costa, MD   500 mg at 03/29/17 0115  . morphine 2 MG/ML injection 2 mg  2 mg  Intravenous Q3H PRN Charna Elizabeth Martensen III, PA-C   2 mg at 03/27/17 1531  . nicotine (NICODERM CQ - dosed in mg/24 hours) patch 21 mg  21 mg Transdermal Daily Ivor Costa, MD      . ondansetron [facility]) injection 4 mg  4 mg Intravenous Q8H PRN Ivor Costa, MD   4 mg at 03/29/17 0120  . oxyCODONE (Oxy IR/ROXICODONE) immediate release tablet 5-10 mg  5-10 mg Oral Q3H PRN Charna Elizabeth Martensen III, PA-C   10 mg at 03/29/17 1300  . pantoprazole (PROTONIX) EC tablet 40 mg  40 mg Oral Daily Ivor Costa, MD   40 mg at 03/29/17 0846  . perphenazine (TRILAFON) tablet 4 mg  4 mg Oral QHS Ivor Costa, MD   4 mg at 03/27/17 2030  . polyethylene glycol (MIRALAX / GLYCOLAX) packet 17 g  17 g Oral Daily PRN Charna Elizabeth Martensen III, PA-C      . pravastatin (PRAVACHOL) tablet 40 mg  40 mg Oral QHS Ivor Costa, MD   40 mg at 03/28/17 2230  . senna (SENOKOT) tablet 8.6 mg  1 tablet Oral BID Charna Elizabeth Martensen III, PA-C   8.6 mg at 03/28/17 1008  . senna-docusate (Senokot-S) tablet 1 tablet  1 tablet Oral QHS PRN Ivor Costa, MD      . sorbitol 70 % solution 30 mL  30 mL Oral Daily PRN Charna Elizabeth Martensen III, PA-C      . SUMAtriptan (IMITREX) tablet 50 mg  50 mg Oral Q2H PRN Ivor Costa, MD      . zolpidem (AMBIEN) tablet 5 mg  5 mg Oral QHS PRN Ivor Costa, MD   5 mg at 03/29/17 0120     Discharge Medications: Please see discharge summary for a list of discharge medications.  Relevant Imaging Results:  Relevant Lab Results:   Additional Information SSN: [id number]  Lilly Cove, LCSW

## 2017-03-29 NOTE — Anesthesia Procedure Notes (Addendum)
Anesthesia Regional Block: Adductor canal block   Pre-Anesthetic Checklist: ,, timeout performed, Correct Patient, Correct Site, Correct Laterality, Correct Procedure, Correct Position, site marked, Risks and benefits discussed,  Surgical consent,  Pre-op evaluation,  At surgeon's request and post-op pain management  Laterality: Left  Prep: chloraprep       Needles:  Injection technique: Single-shot  Needle Type: Stimulator Needle - 80     Needle Length: 9cm  Needle Gauge: 22   Needle insertion depth: 6 cm   Additional Needles:   Procedures: ultrasound guided,,,,,,,,   Nerve Stimulator or Paresthesia:  Response: Patellar snap, 0.5 mA,   Additional Responses:   Narrative:  Start time: 03/29/2017 10:40 AM End time: 03/29/2017 10:44 AM Injection made incrementally with aspirations every 5 mL.  Performed by: Personally  Anesthesiologist: Nolon Nations  Additional Notes: BP cuff, EKG monitors applied. Sedation begun. Femoral artery palpated for location of nerve. After nerve location verified with U/S, anesthetic injected incrementally, slowly, and after negative aspirations under direct u/s guidance. Good perineural spread. Patient tolerated well.

## 2017-03-29 NOTE — Progress Notes (Signed)
Pt stated that she cannot take Senna due to her IBS.

## 2017-03-29 NOTE — Progress Notes (Addendum)
Occupational Therapy Treatment Patient Details Name: Nichole Delgado MRN: [id number] DOB: 01-14-1951 Today's Date: 03/29/2017    History of present illness 66 yo female s/p fall ~10 feet from attic with OPEN REDUCTION INTERNAL FIXATION (ORIF) BIMALLEOLAR ANKLE FRACTURE (Left) 4/8 Dr Percell Miller and Right Fibula and calcaneous fracture pending ORIF.An incidental finding, Nodular consolidation within the left lung apex, measuring 8 mm, possibly semi-solid nodule   OT comments  Pt demonstrated good strength to perform bed mobility from supine to EOB with Min guard for safety. Pt attempt to perform toilet trasnfer to [facility] with drop arm. However, when at EOB, pt states that her knee was in severe pain and she was unable to maintain position in flexion. Pt requested to return to bed due to pain, dizziness, and nausea. Will continue to follow acutely.   Follow Up Recommendations  CIR    Equipment Recommendations  3 in 1 bedside commode;Wheelchair (measurements OT);Wheelchair cushion (measurements OT) (drop arm)    Recommendations for Other Services Rehab consult    Precautions / Restrictions Precautions Precautions: Fall Precaution Comments: NWB BIL LE  Restrictions Weight Bearing Restrictions: Yes RLE Weight Bearing: Non weight bearing LLE Weight Bearing: Non weight bearing       Mobility Bed Mobility Overal bed mobility: Needs Assistance Bed Mobility: Supine to Sit;Sit to Supine     Supine to sit: Min guard Sit to supine: Min assist (To lift BLE due to pain)   General bed mobility comments: Pt demonstrated good strength to come from supine to EOB  Transfers                 General transfer comment: Declined to perfor at this time due to dizziness and nauseous. Pt request to lay back down    Balance                                           ADL either performed or assessed with clinical judgement   ADL Overall ADL's : Needs assistance/impaired                          Toilet Transfer: Moderate assistance;Requires drop arm;BSC (lateral scoot attempt) Toilet Transfer Details (indicate cue type and reason): Pt attempt to perform lateral scoot to [facility] but became dizzy and nauseous when at EOB. Pt also states that her L knee was in too much pain to have over EOB                 Vision       Perception     Praxis      Cognition Arousal/Alertness: Awake/alert Behavior During Therapy: WFL for tasks assessed/performed Overall Cognitive Status: Within Functional Limits for tasks assessed                                          Exercises     Shoulder Instructions       General Comments      Pertinent Vitals/ Pain       Pain Assessment: Faces Faces Pain Scale: Hurts little more Pain Location: BLE and L knee Pain Descriptors / Indicators: Throbbing Pain Intervention(s): Monitored during session;Limited activity within patient's tolerance  Home Living Family/patient expects to be discharged to:: Other (Comment) (  REHAB ) Living Arrangements: Spouse/significant other                                      Prior Functioning/Environment              Frequency  Min 2X/week        Progress Toward Goals  OT Goals(current goals can now be found in the care plan section)  Progress towards OT goals: Progressing toward goals  Acute Rehab OT Goals Patient Stated Goal: to go to rehab ( my husband and daughter are opposed to ) OT Goal Formulation: With patient Time For Goal Achievement: 04/11/17 Potential to Achieve Goals: Good ADL Goals Pt Will Perform Grooming: with modified independence;sitting Pt Will Perform Lower Body Bathing: with modified independence;sitting/lateral leans Pt Will Transfer to Toilet: with supervision;with transfer board;bedside commode Additional ADL Goal #1: pt will complete bed to w/c transfer supervision level as precursor to adls.   Plan  Discharge plan remains appropriate    Co-evaluation                 End of Session Equipment Utilized During Treatment: Oxygen  OT Visit Diagnosis: Unsteadiness on feet (R26.81)   Activity Tolerance Patient limited by pain (Limited by dizziness)   Patient Left in bed;with call bell/phone within reach   Nurse Communication Mobility status;Weight bearing status        Time: 9150-4136 OT Time Calculation (min): 19 min  Charges: OT General Charges $OT Visit: 1 Procedure OT Treatments $Therapeutic Activity: 8-22 mins  [place], OTR/L [phone]   [place] 03/29/2017, 4:45 PM

## 2017-03-29 NOTE — Progress Notes (Addendum)
I met with pt to discuss a possible inpt rehab admission pending medical workup completion and pt/family preference. Noted OR planned for 4/17. Pt would like to discuss with her daughter and spouse. Daughter unable to assist due to work obligations. I have spoken with spouse and he is coming at 1 pm to discuss with his wife. I am contacting Dr. Naaman Plummer for his recommendations. [phone]

## 2017-03-29 NOTE — Progress Notes (Signed)
   Assessment / Plan: 2 Days Post-Op  S/P Procedure(s) (LRB): OPEN REDUCTION INTERNAL FIXATION (ORIF) BIMALLEOLAR ANKLE FRACTURE (Left) by Dr. Ernesta Amble. Percell Miller on 03/27/2017  Active Problems:   Vertigo   HLD (hyperlipidemia)   Fall   Tobacco abuse   Right calcaneal fracture   Closed fracture of both ankles   IBS (irritable bowel syndrome)   GERD (gastroesophageal reflux disease)   Bilateral headaches   DDD (degenerative disc disease), lumbosacral   Left foot drop   Hypoalbuminemia due to protein-calorie malnutrition ([facility])   Leukocytosis  Left Medial and lateral maleolar fracture S/P ORIF 03/27/2017. Nonweightbearing.  Right Fibula and calcaneous fracture Nonweightbearing. Plan for operative fixation after acute swelling has decreased-planning for 04/05/2017.  Agree w/ recommendation for CIR.  Fit for transfer from an orthopedic perspective. Incentive spirometer - provided this morning Elevate legs to reduce pain and swelling  Weight Bearing: Non Weight Bearing (NWB)  Dressings: PRN.  VTE prophylaxis: Lovenox Dispo: CIR  Subjective: Patient reports pain as moderate. Pain controlled with IV and PO meds.  Tolerating diet.  Urinating.    Objective:   VITALS:   Vitals:   03/28/17 0630 03/28/17 1300 03/28/17 2127 03/29/17 0456  BP: 124/60 (!) 114/45 (!) 128/47 123/64  Pulse: 91 74 87 85  Resp:  16 17 15   Temp: 98.9 F (37.2 C) 98.4 F (36.9 C) 99.4 F (37.4 C) 99.2 F (37.3 C)  TempSrc: Oral Oral Oral Oral  SpO2: 92% 95% 98% 91%  Weight:       CBC Latest Ref Rng & Units 03/28/2017 03/27/2017 03/26/2017  WBC 4.0 - 10.5 K/uL 10.7(H) 11.1(H) 18.5(H)  Hemoglobin 12.0 - 15.0 g/dL 13.5 13.5 15.5(H)  Hematocrit 36.0 - 46.0 % 41.7 40.6 45.7  Platelets 150 - 400 K/uL 181 224 224   BMP Latest Ref Rng & Units 03/28/2017 03/27/2017 03/26/2017  Glucose 65 - 99 mg/dL 93 149(H) 106(H)  BUN 6 - 20 mg/dL 9 13 14   Creatinine 0.44 - 1.00 mg/dL 1.05(H) 0.98 0.89  BUN/Creat Ratio 12 -  28 - - -  Sodium 135 - 145 mmol/L 143 140 139  Potassium 3.5 - 5.1 mmol/L 3.9 3.7 4.2  Chloride 101 - 111 mmol/L 106 109 105  CO2 22 - 32 mmol/L 27 25 23   Calcium 8.9 - 10.3 mg/dL 8.4(L) 8.5(L) 9.5   Intake/Output      04/09 0701 - 04/10 0700 04/10 0701 - 04/11 0700   P.O. 720    I.V. (mL/kg)     IV Piggyback     Total Intake(mL/kg) 720 (12.1)    Urine (mL/kg/hr) 800 (0.6)    Blood     Total Output 800     Net -80          Urine Occurrence 3 x      Physical Exam: General: NAD.  Supine in bed. Conversant. MSK Bilateral ankles splinted. Toes warm. EHL FHL intact, painless. Sensation intact distally  Charna Elizabeth Martensen III, PA-C 03/29/2017, 8:02 AM

## 2017-03-29 NOTE — Progress Notes (Signed)
I met with pt and her spouse at bedside. I discussed that Dr. Naaman Plummer recommends d/c to SNF as she awaits her surgery next Tuesday 4/17 and that we can reassess her for possible admission to inpt rehab postoperatively. RN CM and SW made aware. We will sign off. [phone]

## 2017-03-29 NOTE — Progress Notes (Signed)
[facility] PROGRESS NOTE  Nichole Delgado INO:[id number] DOB: 1950-12-29 DOA: 03/26/2017   PCP: Gerrit Heck, MD     Assessment/Plan: Active Problems:   Vertigo   HLD (hyperlipidemia)   Fall   Tobacco abuse   Right calcaneal fracture   Closed fracture of both ankles   IBS (irritable bowel syndrome)   GERD (gastroesophageal reflux disease)   Bilateral headaches   DDD (degenerative disc disease), lumbosacral   Left foot drop   Hypoalbuminemia due to protein-calorie malnutrition (HCC)   Leukocytosis   66 y.o. female with medical history significant of tobacco abuse, hyperlipidemia, GERD, vertigo, headache, IBS, mental illness (patient does not know the diagnosis, taking perphenazine), who presents with fall, injury to both ankles and right foot. Bilateral ankle fracture. Orthopedics consulted  Assessment and plan  Fracture of bilateral ankle, and right calcaneal bone:  Left Medial and lateral maleolar fracture S/P ORIF 03/27/2017. Nonweightbearing. Orthopedic surgeon was consulted. Dr. Percell Miller   Continue telemetry given multiple antipsychotic medications Pain control: morphine prn and percocet When necessary Zofran for nausea Robaxin for muscle spasm PT/OT evaluation-CIR     Right Fibula and calcaneous fracture Nonweightbearing. As per orthopedics Plan for operative fixation after acute swelling has decreased-planning for 04/05/2017.   Leukocytosis: Likely due to stress-induced demargination. Patient does not have signs of infection. Leukocytosis resolved  Negative  UA  Vertigo: -Continue when necessary meclizine  HLD (hyperlipidemia): -Continue pravastatin  IBS: No nausea, vomiting, abdominal pain or diarrhea -Continue home Viberzi if available on formulary   Fall: due to accident. No prodromal symptoms. -PT/OT when able to  GERD: -Protonix  Lung nodule: An incidental finding, Nodular consolidation within the left lung  apex, measuring 8 mm, possibly semi-solid nodule, alternatively chronic scarring or atelectasis.  need to follow-up with PCP to get non-contrast CT at 3-6 months to confirm persistence.    DVT prophylaxsis Lovenox    Code Status:  Full code    Family Communication: Discussed in detail with the patient, all imaging results, lab results explained to the patient   Disposition Plan:  Anticipate discharge to CIR      Consultants:  Orthopedics  Procedures:  NONE   Antibiotics: Anti-infectives    Start     Dose/Rate Route Frequency Ordered Stop   03/27/17 1430  ceFAZolin (ANCEF) IVPB 1 g/50 mL premix     1 g 100 mL/hr over 30 Minutes Intravenous Every 6 hours 03/27/17 1421 03/28/17 0447   03/27/17 1030  ceFAZolin (ANCEF) IVPB 2g/100 mL premix  Status:  Discontinued     2 g 200 mL/hr over 30 Minutes Intravenous On call to O.R. 03/27/17 1028 03/27/17 1037   03/27/17 1026  ceFAZolin (ANCEF) IVPB 2g/100 mL premix     2 g 200 mL/hr over 30 Minutes Intravenous To ShortStay Surgical 03/26/17 2249 03/27/17 1120   03/27/17 0600  ceFAZolin (ANCEF) IVPB 2g/100 mL premix  Status:  Discontinued     2 g 200 mL/hr over 30 Minutes Intravenous To ShortStay Surgical 03/26/17 2155 03/26/17 2249         HPI/Subjective: Pain controlled with IV and PO meds.  Tolerating diet.    Objective: Vitals:   03/28/17 0630 03/28/17 1300 03/28/17 2127 03/29/17 0456  BP: 124/60 (!) 114/45 (!) 128/47 123/64  Pulse: 91 74 87 85  Resp:  16 17 15   Temp: 98.9 F (37.2 C) 98.4 F (36.9 C) 99.4 F (37.4 C) 99.2 F (37.3 C)  TempSrc: Oral  Oral Oral Oral  SpO2: 92% 95% 98% 91%  Weight:        Intake/Output Summary (Last 24 hours) at 03/29/17 1229 Last data filed at 03/29/17 1100  Gross per 24 hour  Intake              720 ml  Output             1200 ml  Net             -480 ml      Examination:  General exam: Appears calm and comfortable  Respiratory system: Clear to auscultation.  Respiratory effort normal. Cardiovascular system: S1 & S2 heard, RRR. No JVD, murmurs, rubs, gallops or clicks. No pedal edema. Gastrointestinal system: Abdomen is nondistended, soft and nontender. No organomegaly or masses felt. Normal bowel sounds heard. Central nervous system: Alert and oriented. No focal neurological deficits. Extremities: Symmetric 5 x 5 power. Skin: No rashes, lesions or ulcers Psychiatry: Judgement and insight appear normal. Mood & affect appropriate.     Data Reviewed: I have personally reviewed following labs and imaging studies  Micro Results Recent Results (from the past 240 hour(s))  MRSA PCR Screening     Status: None   Collection Time: 03/27/17  4:31 AM  Result Value Ref Range Status   MRSA by PCR NEGATIVE NEGATIVE Final    Comment:        The GeneXpert MRSA Assay (FDA approved for NASAL specimens only), is one component of a comprehensive MRSA colonization surveillance program. It is not intended to diagnose MRSA infection nor to guide or monitor treatment for MRSA infections.     Radiology Reports Dg Lumbar Spine Complete  Result Date: 03/26/2017 CLINICAL DATA:  Pt fell from attic at 1 pm today. Pt c/o right heel and lateral ankle pain, left ankle pain with medial bruising and swelling, and lower back pain. Only previous sx is to right great toe. EXAM: LUMBAR SPINE - COMPLETE 4+ VIEW COMPARISON:  None. FINDINGS: Minimal scoliosis which may be positional in nature. Alignment is otherwise normal. No evidence of acute vertebral body subluxation. No fracture line or displaced fracture fragment seen. No compression fracture deformity. Minimal degenerative spurring within the upper and mid lumbar spine. No evidence of advanced degenerative change at any level. Facet joints appear intact and normally aligned. Upper sacrum appears intact and normally aligned. Paravertebral soft tissues are unremarkable. IMPRESSION: No acute findings. No fracture or acute  subluxation within the lumbar spine. Electronically Signed   By: Franki Cabot M.D.   On: 03/26/2017 16:09   Dg Ankle Complete Left  Result Date: 03/26/2017 CLINICAL DATA:  Insert Clinic EXAM: LEFT ANKLE COMPLETE - 3+ VIEW COMPARISON:  None. FINDINGS: Displaced fracture within the medial malleolus, with lateral displacement of the fractured medial malleolus, and medial displacement of the distal tibia relative to the underlying talus. Associated distortion of the ankle mortise. Additional displaced fracture within the upper portion of the lateral malleolus. O fracture seen within the talar dome. No fracture seen within the visualized portions of the hindfoot or midfoot. IMPRESSION: 1. Displaced fracture within the medial malleolus. Associated medial displacement of the distal tibia relative to the underlying talus. Associated distortion of the ankle mortise. 2. Additional displaced fracture within the distal fibula. Electronically Signed   By: Franki Cabot M.D.   On: 03/26/2017 16:08   Dg Ankle Complete Right  Result Date: 03/26/2017 CLINICAL DATA:  Pt fell from attic at 1 pm today. Pt c/o  right heel and lateral ankle pain, left ankle pain with medial bruising and swelling, and lower back pain. Only previous sx is to right great toe. EXAM: RIGHT ANKLE - COMPLETE 3+ VIEW COMPARISON:  None. FINDINGS: Displaced/comminuted fracture within the distal right fibula. Distal tibia appears intact and normally aligned. Ankle mortise is symmetric. Displaced/comminuted fractures within the posterior calcaneus. Probable associated diastases at the overlying subtalar joint spaces. IMPRESSION: 1. Displaced/comminuted fracture within the distal right fibula. 2. Displaced/comminuted fractures within the posterior calcaneus. Probable associated diastases at the overlying subtalar joint spaces. Consider CT for more definitive characterization of fracture extent and osseous alignment. Electronically Signed   By: Franki Cabot  M.D.   On: 03/26/2017 16:05   Ct Head Wo Contrast  Result Date: 03/26/2017 CLINICAL DATA:  Status post fall. EXAM: CT HEAD WITHOUT CONTRAST CT CERVICAL SPINE WITHOUT CONTRAST TECHNIQUE: Multidetector CT imaging of the head and cervical spine was performed following the standard protocol without intravenous contrast. Multiplanar CT image reconstructions of the cervical spine were also generated. COMPARISON:  None. FINDINGS: CT HEAD FINDINGS Brain: Ventricles are normal in size and configuration. There is no hemorrhage, edema or other evidence of acute parenchymal abnormality. No extra-axial hemorrhage. Vascular: No hyperdense vessel or unexpected calcification. Skull: Normal. Negative for fracture or focal lesion. Sinuses/Orbits: No acute finding. Other: Soft tissue edema/laceration overlying the posterior left parietal-occipital bone. No underlying fracture. Slight deformity of the left nasal bone, likely chronic. CT CERVICAL SPINE FINDINGS Alignment: Mild straightening of the normal cervical lordosis related to underlying degenerative change. No evidence of acute vertebral body subluxation. Skull base and vertebrae: No fracture line or displaced fracture fragment identified. Facet joints appear intact and normally aligned throughout. Soft tissues and spinal canal: No prevertebral fluid or swelling. No visible canal hematoma. Disc levels: Mild degenerative spurring throughout the mid and lower cervical spine, with associated mild disc-osteophytic bulges at the C3-4 through C6-7 levels. No more than mild central canal stenosis at any level. Upper chest: Nodular consolidation within the medial aspects of the left lung apex, measuring 8 mm (series 302, image 90), possibly a semi-solid nodule, too small to definitively characterize. Other: Subcentimeter hypodense focus within the right thyroid lobe, most likely a benign colloid nodule. Carotid atherosclerosis. IMPRESSION: 1. Soft tissue edema/laceration overlying the  left posterior parietal-occipital bone. No underlying fracture. 2. No acute intracranial abnormality. No intracranial hemorrhage or edema. 3. No fracture or acute subluxation within the cervical spine. Degenerative changes of the cervical spine, as detailed above. 4. **An incidental finding of potential clinical significance has been found. Nodular consolidation within the left lung apex, measuring 8 mm, possibly semi-solid nodule, alternatively chronic scarring or atelectasis. Follow-up non-contrast CT recommended at 3-6 months to confirm persistence. If unchanged, and solid component remains <6 mm, annual CT is recommended until 5 years of stability has been established. If persistent these nodules should be considered highly suspicious if the solid component of the nodule is 6 mm or greater in size and enlarging. This recommendation follows the consensus statement: Guidelines for Management of Incidental Pulmonary Nodules Detected on CT Images: From the Fleischner Society 2017; Radiology 2017; 284:228-243.** 5. Carotid atherosclerosis. Electronically Signed   By: Franki Cabot M.D.   On: 03/26/2017 17:28   Ct Cervical Spine Wo Contrast  Result Date: 03/26/2017 CLINICAL DATA:  Status post fall. EXAM: CT HEAD WITHOUT CONTRAST CT CERVICAL SPINE WITHOUT CONTRAST TECHNIQUE: Multidetector CT imaging of the head and cervical spine was performed following the standard protocol without  intravenous contrast. Multiplanar CT image reconstructions of the cervical spine were also generated. COMPARISON:  None. FINDINGS: CT HEAD FINDINGS Brain: Ventricles are normal in size and configuration. There is no hemorrhage, edema or other evidence of acute parenchymal abnormality. No extra-axial hemorrhage. Vascular: No hyperdense vessel or unexpected calcification. Skull: Normal. Negative for fracture or focal lesion. Sinuses/Orbits: No acute finding. Other: Soft tissue edema/laceration overlying the posterior left  parietal-occipital bone. No underlying fracture. Slight deformity of the left nasal bone, likely chronic. CT CERVICAL SPINE FINDINGS Alignment: Mild straightening of the normal cervical lordosis related to underlying degenerative change. No evidence of acute vertebral body subluxation. Skull base and vertebrae: No fracture line or displaced fracture fragment identified. Facet joints appear intact and normally aligned throughout. Soft tissues and spinal canal: No prevertebral fluid or swelling. No visible canal hematoma. Disc levels: Mild degenerative spurring throughout the mid and lower cervical spine, with associated mild disc-osteophytic bulges at the C3-4 through C6-7 levels. No more than mild central canal stenosis at any level. Upper chest: Nodular consolidation within the medial aspects of the left lung apex, measuring 8 mm (series 302, image 90), possibly a semi-solid nodule, too small to definitively characterize. Other: Subcentimeter hypodense focus within the right thyroid lobe, most likely a benign colloid nodule. Carotid atherosclerosis. IMPRESSION: 1. Soft tissue edema/laceration overlying the left posterior parietal-occipital bone. No underlying fracture. 2. No acute intracranial abnormality. No intracranial hemorrhage or edema. 3. No fracture or acute subluxation within the cervical spine. Degenerative changes of the cervical spine, as detailed above. 4. **An incidental finding of potential clinical significance has been found. Nodular consolidation within the left lung apex, measuring 8 mm, possibly semi-solid nodule, alternatively chronic scarring or atelectasis. Follow-up non-contrast CT recommended at 3-6 months to confirm persistence. If unchanged, and solid component remains <6 mm, annual CT is recommended until 5 years of stability has been established. If persistent these nodules should be considered highly suspicious if the solid component of the nodule is 6 mm or greater in size and  enlarging. This recommendation follows the consensus statement: Guidelines for Management of Incidental Pulmonary Nodules Detected on CT Images: From the Fleischner Society 2017; Radiology 2017; 284:228-243.** 5. Carotid atherosclerosis. Electronically Signed   By: Franki Cabot M.D.   On: 03/26/2017 17:28   Ct Ankle Right Wo Contrast  Result Date: 03/26/2017 CLINICAL DATA:  Right ankle fracture after falling through attic floor landing on both feet. EXAM: CT OF THE RIGHT ANKLE WITHOUT CONTRAST TECHNIQUE: Multidetector CT imaging of the right ankle was performed according to the standard protocol. Multiplanar CT image reconstructions were also generated. COMPARISON:  03/26/2017 ankle radiographs FINDINGS: Bones/Joint/Cartilage 1. Closed, comminuted distal fibular diaphyseal fracture 2.5 cm from the ankle mortise. Slight lateral angulation of the main distal fracture fragment with buckling and 3 mm of lateral displacement of a small butterfly fracture fragment. 2. 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 No encroachment on the sinus tarsi. 3. Fracture along the long axis of the calcaneus is noted on the sagittal reformats through the body and posterior calcaneus with a coronal fracture fragment extending to the plantar surface off this fracture (series 201, image 17) and a second extending through the posterior calcaneus (series 204, image 24). 3 mm of offset is noted at the posterior subtalar joint (series 204, image 20).  4. Tiny ossific density off the plantar dorsal aspect of the cuboid may represent a small avulsion, age indeterminate given its slightly rounded margins. 5. The ankle mortise is maintained. No distal tibial fracture is seen. The midfoot articulations are appear congruent. Ligaments Suboptimally assessed by  CT. Muscles and Tendons Intact peroneal tendons. Intact Achilles. Intact extensor tendons crossing the ankle joint. Intact tibialis posterior and flexor digitorum longus tendons. The flexor hallucis is adjacent to one of the fracture lines medially and there is potential for entrapment, series 202, image 64. Soft tissues Diffuse soft tissue swelling along the dorsal plantar aspect of the foot. IMPRESSION: 1. Acute, closed, distal fibular diaphyseal fracture. 2. 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 3. Age-indeterminate tiny ossific density off the plantar dorsal aspect of the cuboid. Electronically Signed   By: Ashley Royalty M.D.   On: 03/26/2017 18:08   Dg Ankle Left Port  Result Date: 03/26/2017 CLINICAL DATA:  Status post reduction of left ankle fracture. Initial encounter. EXAM: PORTABLE LEFT ANKLE - 2 VIEW COMPARISON:  Left ankle radiographs performed earlier today at 3:08 p.m. FINDINGS: There is improved alignment of the medial malleolar fracture. A minimally displaced distal fibular fracture is again noted. There is mild residual lateral tilt of the talus, with mild widening of the medial aspect of the ankle mortise. Mild surrounding soft tissue swelling is noted. A splint is noted about the lower leg and ankle. IMPRESSION: Improved alignment of the medial malleolar fracture. Minimally displaced distal fibular fracture again noted. Mild residual lateral tilt of the talus, with mild widening of the medial aspect of the ankle mortise. Electronically Signed   By: Garald Balding M.D.   On: 03/26/2017 20:04   Dg Foot Complete Left  Result Date: 03/26/2017 CLINICAL DATA:  [facility] EXAM: LEFT FOOT - COMPLETE 3+ VIEW COMPARISON:  None. FINDINGS: No fracture line or displaced fracture fragment seen within the hindfoot, midfoot or forefoot. Displaced fractures of  the medial malleolus and lateral malleolus are better demonstrated on the dedicated ankle exam. IMPRESSION: Negative left foot. Electronically Signed   By: Franki Cabot M.D.   On: 03/26/2017 16:10   Dg Foot Complete Right  Result Date: 03/26/2017 CLINICAL DATA:  Pt fell from attic at 1 pm today. Pt c/o right heel and lateral ankle pain, left ankle pain with medial bruising and swelling, and lower back pain. Only previous sx is to right great toe. EXAM: RIGHT FOOT COMPLETE - 3+ VIEW COMPARISON:  None. FINDINGS: Displaced/comminuted fractures within the posterior calcaneus. Probable associated diastases at the overlying subtalar joint spaces. IMPRESSION: Displaced/comminuted fractures within the posterior calcaneus. Probable associated diastases at the overlying subtalar joint spaces. Consider CT for more definitive characterization of fracture extent and osseous alignment. Electronically Signed   By: Franki Cabot M.D.   On: 03/26/2017 16:04     CBC  Recent Labs Lab 03/26/17 1559 03/27/17 0232 03/28/17 0353  WBC 18.5* 11.1* 10.7*  HGB 15.5* 13.5 13.5  HCT 45.7 40.6 41.7  PLT 224 224 181  MCV 90.3 90.8 93.3  MCH 30.6 30.2 30.2  MCHC 33.9 33.3 32.4  RDW 13.1 13.2 13.6  LYMPHSABS 2.3  --   --   MONOABS 0.9  --   --   EOSABS 0.0  --   --   BASOSABS 0.1  --   --  Chemistries   Recent Labs Lab 03/26/17 1559 03/27/17 0232 03/28/17 0353 03/28/17 1001  NA 139 140 143  --   K 4.2 3.7 3.9  --   CL 105 109 106  --   CO2 23 25 27   --   GLUCOSE 106* 149* 93  --   BUN 14 13 9   --   CREATININE 0.89 0.98 1.05*  --   CALCIUM 9.5 8.5* 8.4*  --   MG  --   --   --  1.8  AST  --   --  24  --   ALT  --   --  15  --   ALKPHOS  --   --  45  --   BILITOT  --   --  0.4  --    ------------------------------------------------------------------------------------------------------------------ estimated creatinine clearance is 40.8 mL/min (A) (by C-G formula based on SCr of 1.05 mg/dL  (H)). ------------------------------------------------------------------------------------------------------------------ No results for input(s): HGBA1C in the last 72 hours. ------------------------------------------------------------------------------------------------------------------ No results for input(s): CHOL, HDL, LDLCALC, TRIG, CHOLHDL, LDLDIRECT in the last 72 hours. ------------------------------------------------------------------------------------------------------------------ No results for input(s): TSH, T4TOTAL, T3FREE, THYROIDAB in the last 72 hours.  Invalid input(s): FREET3 ------------------------------------------------------------------------------------------------------------------ No results for input(s): VITAMINB12, FOLATE, FERRITIN, TIBC, IRON, RETICCTPCT in the last 72 hours.  Coagulation profile  Recent Labs Lab 03/26/17 2100  INR 0.94    No results for input(s): DDIMER in the last 72 hours.  Cardiac Enzymes No results for input(s): CKMB, TROPONINI, MYOGLOBIN in the last 168 hours.  Invalid input(s): CK ------------------------------------------------------------------------------------------------------------------ Invalid input(s): POCBNP   CBG: No results for input(s): GLUCAP in the last 168 hours.     Studies: No results found.    No results found for: HGBA1C Lab Results  Component Value Date   CREATININE 1.05 (H) 03/28/2017       Scheduled Meds: . docusate sodium  100 mg Oral BID  . Eluxadoline  75 mg Oral BID  . enoxaparin (LOVENOX) injection  40 mg Subcutaneous Q24H  . feeding supplement (ENSURE ENLIVE)  237 mL Oral Q1500  . fluticasone  2 spray Each Nare Daily  . ipratropium  2 spray Each Nare BID  . magnesium oxide  400 mg Oral Daily  . nicotine  21 mg Transdermal Daily  . pantoprazole  40 mg Oral Daily  . perphenazine  4 mg Oral QHS  . pravastatin  40 mg Oral QHS  . senna  1 tablet Oral BID   Continuous  Infusions: . lactated ringers 75 mL/hr at 03/27/17 1518     LOS: 3 days    Time spent: >30 MINS    Reyne Dumas  [facility] Pager [phone]. If 7PM-7AM, please contact night-coverage at [email or web address], password [facility] 03/29/2017, 12:29 PM  LOS: 3 days

## 2017-03-29 NOTE — Anesthesia Postprocedure Evaluation (Signed)
Anesthesia Post Note  Patient: Nichole Delgado  Procedure(s) Performed: Procedure(s) (LRB): OPEN REDUCTION INTERNAL FIXATION (ORIF) BIMALLEOLAR ANKLE FRACTURE (Left)  Patient location during evaluation: PACU Anesthesia Type: Regional and General Level of consciousness: sedated and patient cooperative Pain management: pain level controlled Vital Signs Assessment: post-procedure vital signs reviewed and stable Respiratory status: spontaneous breathing Cardiovascular status: stable Anesthetic complications: no       Last Vitals:  Vitals:   03/28/17 2127 03/29/17 0456  BP: (!) 128/47 123/64  Pulse: 87 85  Resp: 17 15  Temp: 37.4 C 37.3 C    Last Pain:  Vitals:   03/29/17 0900  TempSrc:   PainSc: 2                  Nolon Nations

## 2017-03-29 NOTE — Progress Notes (Signed)
PT Cancellation Note  Patient Details Name: Nichole Delgado MRN: [id number] DOB: 03-20-1951   Cancelled Treatment:    Reason Eval/Treat Not Completed: Patient declined, no reason specified Pt declined mobility due to feeling nauseated when attempting to get OOB earlier. Pt educated on benefits of OOB mobility and encouraged to get OOB with nursing staff assistance for dinner. PT will continue to follow acutely.    Salina April, PTA Pager: [phone]   03/29/2017, 3:57 PM

## 2017-03-29 NOTE — Clinical Social Work Note (Signed)
Clinical Social Work Assessment  Patient Details  Name: Nichole Delgado MRN: [id number] Date of Birth: 07/01/1951  Date of referral:  03/29/17               Reason for consult:  Facility Placement, Discharge Planning                Permission sought to share information with:  Case Manager, Customer service manager, Family Supports Permission granted to share information::  Yes, Verbal Permission Granted  Name::        Agency::     Relationship::  Husband at bedside  Contact Information:     Housing/Transportation Living arrangements for the past 2 months:  [place] of Information:  Patient, Medical Team, Facility, Case Manager Patient Interpreter Needed:  None Criminal Activity/Legal Involvement Pertinent to Current Situation/Hospitalization:  No - Comment as needed Significant Relationships:  Other Family Members, Spouse Lives with:  Self Do you feel safe going back to the place where you live?  No Need for family participation in patient care:  No (Coment)  Care giving concerns:  Patient is from home with her husband and support from daughter. Plan and recommendations were for CIR, however patient has yet to have surgery and CIR team felt patient would benefit from SNF placement prior to surgery, reassess after surgery and follow up.  Family agreeable to plan due to patient not at baseline and requiring more assistance than able to provide.  SNF recommendation at this time.  Social Worker assessment / plan:  SNF work up completed. Patient, husband and daughter given SNF list in effort to research beds in [facility]. Plan is for DC on 4/11 and LCSW to follow up with bed offers and assist with transfer.  Plan: SNF  Employment status:  Retired Health visitor, Managed Care PT Recommendations:  [place], [place] / Referral to community resources:  [place], Acute  Rehab  Patient/Family's Response to care:  Agreeable to plan  Patient/Family's Understanding of and Emotional Response to Diagnosis, Current Treatment, and Prognosis:  Husband and daughter understanding along with patient recommendations and care needs prior to discharge.  Aware surgery will happen in the next few weeks.  Emotional Assessment Appearance:  Appears stated age Attitude/Demeanor/Rapport:    Affect (typically observed):  Accepting, Adaptable, Pleasant Orientation:  Oriented to Self, Oriented to Place, Oriented to  Time, Oriented to Situation Alcohol / Substance use:  Not Applicable Psych involvement (Current and /or in the community):  No (Comment)  Discharge Needs  Concerns to be addressed:  No discharge needs identified Readmission within the last 30 days:  No Current discharge risk:  None Barriers to Discharge:  Continued Medical Work up   Lilly [place], LCSW 03/29/2017, 2:28 PM

## 2017-03-29 NOTE — Anesthesia Procedure Notes (Signed)
Anesthesia Regional Block: Popliteal block   Pre-Anesthetic Checklist: ,, timeout performed, Correct Patient, Correct Site, Correct Laterality, Correct Procedure, Correct Position, site marked, Risks and benefits discussed,  Surgical consent,  Pre-op evaluation,  At surgeon's request and post-op pain management  Laterality: Left  Prep: chloraprep       Needles:  Injection technique: Single-shot  Needle Type: Stimiplex     Needle Length: 10cm  Needle Gauge: 21     Additional Needles:   Procedures: ultrasound guided, nerve stimulator,,,,,,  Motor weakness within 5 minutes.   Nerve Stimulator or Paresthesia:  Response: Plantar flexion/toe flexion, 0.5 mA,   Additional Responses:   Narrative:  Start time: 03/29/2017 10:45 AM End time: 03/29/2017 10:51 AM Injection made incrementally with aspirations every 5 mL.  Performed by: Personally  Anesthesiologist: Nolon Nations  Additional Notes: Nerve located and needle positioned with direct ultrasound guidance. Good perineural spread. Patient tolerated well.

## 2017-03-30 DIAGNOSIS — Z4789 Encounter for other orthopedic aftercare: Secondary | ICD-10-CM | POA: Diagnosis not present

## 2017-03-30 DIAGNOSIS — M24271 Disorder of ligament, right ankle: Secondary | ICD-10-CM | POA: Diagnosis not present

## 2017-03-30 DIAGNOSIS — S92001A Unspecified fracture of right calcaneus, initial encounter for closed fracture: Secondary | ICD-10-CM | POA: Diagnosis not present

## 2017-03-30 DIAGNOSIS — S82852D Displaced trimalleolar fracture of left lower leg, subsequent encounter for closed fracture with routine healing: Secondary | ICD-10-CM | POA: Diagnosis not present

## 2017-03-30 DIAGNOSIS — G43909 Migraine, unspecified, not intractable, without status migrainosus: Secondary | ICD-10-CM | POA: Diagnosis not present

## 2017-03-30 DIAGNOSIS — Z880 Allergy status to penicillin: Secondary | ICD-10-CM | POA: Diagnosis not present

## 2017-03-30 DIAGNOSIS — K581 Irritable bowel syndrome with constipation: Secondary | ICD-10-CM | POA: Diagnosis not present

## 2017-03-30 DIAGNOSIS — E569 Vitamin deficiency, unspecified: Secondary | ICD-10-CM | POA: Diagnosis not present

## 2017-03-30 DIAGNOSIS — M6281 Muscle weakness (generalized): Secondary | ICD-10-CM | POA: Diagnosis not present

## 2017-03-30 DIAGNOSIS — S86191A Other injury of other muscle(s) and tendon(s) of posterior muscle group at lower leg level, right leg, initial encounter: Secondary | ICD-10-CM | POA: Diagnosis not present

## 2017-03-30 DIAGNOSIS — K219 Gastro-esophageal reflux disease without esophagitis: Secondary | ICD-10-CM | POA: Diagnosis not present

## 2017-03-30 DIAGNOSIS — E46 Unspecified protein-calorie malnutrition: Secondary | ICD-10-CM | POA: Diagnosis not present

## 2017-03-30 DIAGNOSIS — E782 Mixed hyperlipidemia: Secondary | ICD-10-CM | POA: Diagnosis not present

## 2017-03-30 DIAGNOSIS — G8918 Other acute postprocedural pain: Secondary | ICD-10-CM | POA: Diagnosis not present

## 2017-03-30 DIAGNOSIS — R42 Dizziness and giddiness: Secondary | ICD-10-CM | POA: Diagnosis not present

## 2017-03-30 DIAGNOSIS — G8911 Acute pain due to trauma: Secondary | ICD-10-CM | POA: Diagnosis not present

## 2017-03-30 DIAGNOSIS — F419 Anxiety disorder, unspecified: Secondary | ICD-10-CM | POA: Diagnosis not present

## 2017-03-30 DIAGNOSIS — G43901 Migraine, unspecified, not intractable, with status migrainosus: Secondary | ICD-10-CM | POA: Diagnosis not present

## 2017-03-30 DIAGNOSIS — M21372 Foot drop, left foot: Secondary | ICD-10-CM | POA: Diagnosis not present

## 2017-03-30 DIAGNOSIS — H8149 Vertigo of central origin, unspecified ear: Secondary | ICD-10-CM | POA: Diagnosis not present

## 2017-03-30 DIAGNOSIS — S92061A Displaced intraarticular fracture of right calcaneus, initial encounter for closed fracture: Secondary | ICD-10-CM | POA: Diagnosis not present

## 2017-03-30 DIAGNOSIS — M25571 Pain in right ankle and joints of right foot: Secondary | ICD-10-CM | POA: Diagnosis not present

## 2017-03-30 DIAGNOSIS — K589 Irritable bowel syndrome without diarrhea: Secondary | ICD-10-CM | POA: Diagnosis not present

## 2017-03-30 DIAGNOSIS — Z885 Allergy status to narcotic agent status: Secondary | ICD-10-CM | POA: Diagnosis not present

## 2017-03-30 DIAGNOSIS — Z6825 Body mass index (BMI) 25.0-25.9, adult: Secondary | ICD-10-CM | POA: Diagnosis not present

## 2017-03-30 DIAGNOSIS — R51 Headache: Secondary | ICD-10-CM | POA: Diagnosis not present

## 2017-03-30 DIAGNOSIS — Z79899 Other long term (current) drug therapy: Secondary | ICD-10-CM | POA: Diagnosis not present

## 2017-03-30 DIAGNOSIS — S92001D Unspecified fracture of right calcaneus, subsequent encounter for fracture with routine healing: Secondary | ICD-10-CM | POA: Diagnosis not present

## 2017-03-30 DIAGNOSIS — D72829 Elevated white blood cell count, unspecified: Secondary | ICD-10-CM | POA: Diagnosis not present

## 2017-03-30 DIAGNOSIS — F17290 Nicotine dependence, other tobacco product, uncomplicated: Secondary | ICD-10-CM | POA: Diagnosis not present

## 2017-03-30 DIAGNOSIS — M25579 Pain in unspecified ankle and joints of unspecified foot: Secondary | ICD-10-CM

## 2017-03-30 DIAGNOSIS — R278 Other lack of coordination: Secondary | ICD-10-CM | POA: Diagnosis not present

## 2017-03-30 DIAGNOSIS — F209 Schizophrenia, unspecified: Secondary | ICD-10-CM | POA: Diagnosis not present

## 2017-03-30 DIAGNOSIS — Z87891 Personal history of nicotine dependence: Secondary | ICD-10-CM | POA: Diagnosis not present

## 2017-03-30 DIAGNOSIS — Y92008 Other place in unspecified non-institutional (private) residence as the place of occurrence of the external cause: Secondary | ICD-10-CM | POA: Diagnosis not present

## 2017-03-30 DIAGNOSIS — S82891A Other fracture of right lower leg, initial encounter for closed fracture: Secondary | ICD-10-CM | POA: Diagnosis not present

## 2017-03-30 DIAGNOSIS — S82892A Other fracture of left lower leg, initial encounter for closed fracture: Secondary | ICD-10-CM | POA: Diagnosis not present

## 2017-03-30 DIAGNOSIS — Z882 Allergy status to sulfonamides status: Secondary | ICD-10-CM | POA: Diagnosis not present

## 2017-03-30 DIAGNOSIS — E785 Hyperlipidemia, unspecified: Secondary | ICD-10-CM | POA: Diagnosis not present

## 2017-03-30 DIAGNOSIS — M5136 Other intervertebral disc degeneration, lumbar region: Secondary | ICD-10-CM | POA: Diagnosis not present

## 2017-03-30 DIAGNOSIS — S82852A Displaced trimalleolar fracture of left lower leg, initial encounter for closed fracture: Secondary | ICD-10-CM | POA: Diagnosis not present

## 2017-03-30 DIAGNOSIS — R2689 Other abnormalities of gait and mobility: Secondary | ICD-10-CM | POA: Diagnosis not present

## 2017-03-30 DIAGNOSIS — W1789XA Other fall from one level to another, initial encounter: Secondary | ICD-10-CM | POA: Diagnosis not present

## 2017-03-30 DIAGNOSIS — Z9181 History of falling: Secondary | ICD-10-CM | POA: Diagnosis not present

## 2017-03-30 DIAGNOSIS — J309 Allergic rhinitis, unspecified: Secondary | ICD-10-CM | POA: Diagnosis not present

## 2017-03-30 DIAGNOSIS — S82842D Displaced bimalleolar fracture of left lower leg, subsequent encounter for closed fracture with routine healing: Secondary | ICD-10-CM | POA: Diagnosis not present

## 2017-03-30 DIAGNOSIS — S8002XA Contusion of left knee, initial encounter: Secondary | ICD-10-CM | POA: Diagnosis not present

## 2017-03-30 DIAGNOSIS — S82841D Displaced bimalleolar fracture of right lower leg, subsequent encounter for closed fracture with routine healing: Secondary | ICD-10-CM | POA: Diagnosis not present

## 2017-03-30 DIAGNOSIS — S92909A Unspecified fracture of unspecified foot, initial encounter for closed fracture: Secondary | ICD-10-CM | POA: Diagnosis not present

## 2017-03-30 DIAGNOSIS — S82841A Displaced bimalleolar fracture of right lower leg, initial encounter for closed fracture: Secondary | ICD-10-CM | POA: Diagnosis not present

## 2017-03-30 LAB — BASIC METABOLIC PANEL
Anion gap: 9 (ref 5–15)
BUN: 6 mg/dL (ref 6–20)
CO2: 25 mmol/L (ref 22–32)
Calcium: 8.7 mg/dL — ABNORMAL LOW (ref 8.9–10.3)
Chloride: 107 mmol/L (ref 101–111)
Creatinine, Ser: 0.75 mg/dL (ref 0.44–1.00)
GFR calc Af Amer: >60 mL/min (ref >60)
GLUCOSE: 113 mg/dL — AB (ref 65–99)
POTASSIUM: 3.8 mmol/L (ref 3.5–5.1)
Sodium: 141 mmol/L (ref 135–145)

## 2017-03-30 LAB — CBC
HEMATOCRIT: 37.9 % (ref 36.0–46.0)
Hemoglobin: 12.8 g/dL (ref 12.0–15.0)
MCH: 30.7 pg (ref 26.0–34.0)
MCHC: 33.8 g/dL (ref 30.0–36.0)
MCV: 90.9 fL (ref 78.0–100.0)
Platelets: 169 10*3/uL (ref 150–400)
RBC: 4.17 MIL/uL (ref 3.87–5.11)
RDW: 13.4 % (ref 11.5–15.5)
WBC: 8.2 10*3/uL (ref 4.0–10.5)

## 2017-03-30 MED ORDER — ONDANSETRON HCL 4 MG PO TABS
4.0000 mg | ORAL_TABLET | Freq: Once | ORAL | Status: AC
  Administered 2017-03-30: 4 mg via ORAL
  Filled 2017-03-30: qty 1

## 2017-03-30 MED ORDER — NICOTINE 21 MG/24HR TD PT24: 21.0000 mg | MEDICATED_PATCH | Freq: Every day | TRANSDERMAL | 0 refills | Status: DC

## 2017-03-30 MED ORDER — MAGNESIUM OXIDE 400 (241.3 MG) MG PO TABS: 400.0000 mg | ORAL_TABLET | Freq: Every day | ORAL | 1 refills | Status: DC

## 2017-03-30 MED ORDER — METHOCARBAMOL 500 MG PO TABS: 500.0000 mg | ORAL_TABLET | Freq: Three times a day (TID) | ORAL | 1 refills | Status: DC | PRN

## 2017-03-30 MED ORDER — ALPRAZOLAM 0.5 MG PO TABS: ORAL_TABLET | ORAL | 0 refills | Status: DC

## 2017-03-30 MED ORDER — DOCUSATE SODIUM 100 MG PO CAPS: 100.0000 mg | ORAL_CAPSULE | Freq: Two times a day (BID) | ORAL | 0 refills | Status: DC

## 2017-03-30 NOTE — Care Management Note (Deleted)
Case Management Note  Patient Details  Name: Nichole Delgado MRN: [id number] Date of Birth: 03-17-1951  Subjective/Objective:    66 yr old female s/p left total knee arthroplasty.                Action/Plan:  Patient was preoperatively setup with [facility], no changes. She will have support of husband and family at discharge. DME has been delivered to patient.    Expected Discharge Date:  03/30/17               Expected Discharge Plan:  [place]  In-House Referral:     Discharge planning Services  CM Consult  Post Acute Care Choice:  Home Health, Durable Medical Equipment Choice offered to:  Patient, Spouse  DME Arranged:  3-N-1, CPM, [place] [facility] DME Agency:     HH Arranged:  PT HH Agency:  [facility] [employer]  Status of Service:     If discussed at [employer], dates discussed:    Additional Comments:  Ninfa Meeker, RN 03/30/2017, 8:44 AM

## 2017-03-30 NOTE — Care Management Important Message (Signed)
Important Message  Patient Details  Name: Nichole Delgado MRN: [id number] Date of Birth: 1951-03-16   Medicare Important Message Given:  Yes    Marielouise Amey Montine Circle 03/30/2017, 12:25 PM

## 2017-03-30 NOTE — Care Management (Signed)
66 yr old female s/p fall with bilateral ankle fractures. Patient underwent a left ankle ORIF on 03/27/17, plan is for patient to have right ankle ORIF on 4/17. She will need shortterm SNF until that time. Social worker has been notified.

## 2017-03-30 NOTE — Progress Notes (Signed)
Patient to be discharged to [facility]. Report given Antony Madura. IV removed. Patient is waiting for [facility] for transportation.

## 2017-03-30 NOTE — Progress Notes (Signed)
   Assessment / Plan: 3 Days Post-Op  S/P Procedure(s) (LRB): OPEN REDUCTION INTERNAL FIXATION (ORIF) BIMALLEOLAR ANKLE FRACTURE (Left) by Dr. Ernesta Amble. Percell Miller on 03/27/2017  Principal Problem:   Closed fracture of both ankles Active Problems:   Vertigo   HLD (hyperlipidemia)   Fall   Tobacco abuse   Right calcaneal fracture   IBS (irritable bowel syndrome)   GERD (gastroesophageal reflux disease)   Bilateral headaches   DDD (degenerative disc disease), lumbosacral   Left foot drop   Hypoalbuminemia due to protein-calorie malnutrition (HCC)   Leukocytosis  Left Medial and lateral maleolar fracture S/P ORIF 03/27/2017. Nonweightbearing.  Right Fibula and calcaneous fracture Nonweightbearing. Plan for operative fixation after acute swelling has decreased-planning for 04/05/2017.  To SNF.  Fit for transfer from an orthopedic perspective.  Return for ORIF Right LE. Incentive spirometer  Elevate legs to reduce pain and swelling Smoking cessation recommended.  Weight Bearing: Non Weight Bearing (NWB) BLE Dressings: PRN.  VTE prophylaxis: Lovenox Dispo: SNF  Subjective: Patient reports pain as moderate / improving. Pain controlled with PO meds.  Tolerating diet.  Urinating.    Objective:   VITALS:   Vitals:   03/28/17 2127 03/29/17 0456 03/29/17 2026 03/30/17 0456  BP: (!) 128/47 123/64 (!) 146/65 (!) 153/61  Pulse: 87 85 77 87  Resp: 17 15 15 15   Temp: 99.4 F (37.4 C) 99.2 F (37.3 C) 98.1 F (36.7 C) 98.1 F (36.7 C)  TempSrc: Oral Oral Oral Oral  SpO2: 98% 91% 97% 92%  Weight:       CBC Latest Ref Rng & Units 03/30/2017 03/28/2017 03/27/2017  WBC 4.0 - 10.5 K/uL 8.2 10.7(H) 11.1(H)  Hemoglobin 12.0 - 15.0 g/dL 12.8 13.5 13.5  Hematocrit 36.0 - 46.0 % 37.9 41.7 40.6  Platelets 150 - 400 K/uL 169 181 224   BMP Latest Ref Rng & Units 03/30/2017 03/28/2017 03/27/2017  Glucose 65 - 99 mg/dL 113(H) 93 149(H)  BUN 6 - 20 mg/dL 6 9 13   Creatinine 0.44 - 1.00 mg/dL  0.75 1.05(H) 0.98  BUN/Creat Ratio 12 - 28 - - -  Sodium 135 - 145 mmol/L 141 143 140  Potassium 3.5 - 5.1 mmol/L 3.8 3.9 3.7  Chloride 101 - 111 mmol/L 107 106 109  CO2 22 - 32 mmol/L 25 27 25   Calcium 8.9 - 10.3 mg/dL 8.7(L) 8.4(L) 8.5(L)   Intake/Output      04/10 0701 - 04/11 0700 04/11 0701 - 04/12 0700   P.O. 960    Total Intake(mL/kg) 960 (16.2)    Urine (mL/kg/hr) 1400 (1)    Total Output 1400     Net -440          Urine Occurrence 4 x      Physical Exam: General: NAD.  Supine in bed. Conversant. MSK Bilateral ankles splinted. Toes warm. EHL FHL intact, painless. Sensation intact distally  Prudencio Burly III, PA-C 03/30/2017, 7:59 AM

## 2017-03-30 NOTE — Clinical Social Work Note (Signed)
Clinical Social Worker facilitated patient discharge including contacting patient family and facility to confirm patient discharge plans.  Clinical information faxed to facility and family agreeable with plan.  CSW arranged ambulance transport via [place] to [facility] .  RN to call [phone] for report prior to discharge.  Clinical Social Worker will sign off for now as social work intervention is no longer needed. Please consult [place] again if new need arises.  [phone] [place]

## 2017-03-30 NOTE — Discharge Summary (Signed)
Physician Discharge Summary  Nichole Delgado MRN: [id number] DOB/AGE: 05/23/1951 66 y.o.  PCP: Gerrit Heck, MD   Admit date: 03/26/2017 Discharge date: 03/30/2017  Discharge Diagnoses:    Principal Problem:   Closed fracture of both ankles Active Problems:   Vertigo   HLD (hyperlipidemia)   Fall   Tobacco abuse   Right calcaneal fracture   IBS (irritable bowel syndrome)   GERD (gastroesophageal reflux disease)   Bilateral headaches   DDD (degenerative disc disease), lumbosacral   Left foot drop   Hypoalbuminemia due to protein-calorie malnutrition (HCC)   Leukocytosis    Follow-up recommendations Follow-up with PCP in 3-5 days , including all  additional recommended appointments as below Follow-up CBC, CMP in 3-5 days Plan for operative fixation after acute swelling has decreased-planning for 04/05/2017. Continue incentive spirometry Hold Lovenox day prior to surgery Follow-up CT scan for 8 mm lung nodule in the left lung apex recommended in 3 months, to be arranged for by PCP      Current Discharge Medication List    START taking these medications   Details  baclofen (LIORESAL) 10 MG tablet Take 1 tablet (10 mg total) by mouth 3 (three) times daily as needed for muscle spasms. Qty: 40 each, Refills: 0    docusate sodium (COLACE) 100 MG capsule Take 1 capsule (100 mg total) by mouth 2 (two) times daily. Qty: 10 capsule, Refills: 0    enoxaparin (LOVENOX) 40 MG/0.4ML injection Inject 0.4 mLs (40 mg total) into the skin daily. For 30 days post op for DVT prophylaxis Qty: 30 Syringe, Refills: 0    magnesium oxide (MAG-OX) 400 (241.3 Mg) MG tablet Take 1 tablet (400 mg total) by mouth daily. Qty: 30 tablet, Refills: 1    methocarbamol (ROBAXIN) 500 MG tablet Take 1 tablet (500 mg total) by mouth every 8 (eight) hours as needed for muscle spasms. Qty: 30 tablet, Refills: 1    nicotine (NICODERM CQ - DOSED IN MG/24 HOURS) 21 mg/24hr patch Place 1  patch (21 mg total) onto the skin daily. Qty: 28 patch, Refills: 0    ondansetron (ZOFRAN) 4 MG tablet Take 1 tablet (4 mg total) by mouth every 8 (eight) hours as needed for nausea or vomiting. Qty: 40 tablet, Refills: 0    oxyCODONE-acetaminophen (ROXICET) 5-325 MG tablet Take 1-2 tablets by mouth every 4 (four) hours as needed for severe pain. Qty: 60 tablet, Refills: 0      CONTINUE these medications which have CHANGED   Details  ALPRAZolam (XANAX) 0.5 MG tablet Take 2 tablets approximately 45 minutes prior to the MRI study, take a third tablet if needed. Qty: 3 tablet, Refills: 0      CONTINUE these medications which have NOT CHANGED   Details  Eluxadoline (VIBERZI) 75 MG TABS Take 75 mg by mouth 2 (two) times daily.    fluticasone (FLONASE) 50 MCG/ACT nasal spray Place 2 sprays into both nostrils daily.     meclizine (ANTIVERT) 25 MG tablet Take 25 mg by mouth daily as needed.     omeprazole (PRILOSEC) 40 MG capsule Take 40 mg by mouth daily as needed (indigestion).     perphenazine (TRILAFON) 4 MG tablet Take 4 mg by mouth at bedtime.    pravastatin (PRAVACHOL) 40 MG tablet Take 40 mg by mouth at bedtime.     SUMAtriptan Succinate (IMITREX PO) Take by mouth daily as needed.    ipratropium (ATROVENT) 0.03 % nasal spray Place 2 sprays into the nose  2 (two) times daily. Qty: 30 mL, Refills: 5    rizatriptan (MAXALT-MLT) 10 MG disintegrating tablet Take 1 tablet (10 mg total) by mouth 3 (three) times daily as needed for migraine. Qty: 9 tablet, Refills: 11      STOP taking these medications     meloxicam (MOBIC) 7.5 MG tablet      traMADol (ULTRAM) 50 MG tablet      zolpidem (AMBIEN) 10 MG tablet          Discharge Condition:Stable   Discharge Instructions Get Medicines reviewed and adjusted: Please take all your medications with you for your next visit with your Primary MD  Please request your Primary MD to go over all hospital tests and  procedure/radiological results at the follow up, please ask your Primary MD to get all Hospital records sent to his/her office.  If you experience worsening of your admission symptoms, develop shortness of breath, life threatening emergency, suicidal or homicidal thoughts you must seek medical attention immediately by calling 911 or calling your MD immediately if symptoms less severe.  You must read complete instructions/literature along with all the possible adverse reactions/side effects for all the Medicines you take and that have been prescribed to you. Take any new Medicines after you have completely understood and accpet all the possible adverse reactions/side effects.   Do not drive when taking Pain medications.   Do not take more than prescribed Pain, Sleep and Anxiety Medications  Special Instructions: If you have smoked or chewed Tobacco in the last 2 yrs please stop smoking, stop any regular Alcohol and or any Recreational drug use.  Wear Seat belts while driving.  Please note  You were cared for by a hospitalist during your hospital stay. Once you are discharged, your primary care physician will handle any further medical issues. Please note that NO REFILLS for any discharge medications will be authorized once you are discharged, as it is imperative that you return to your primary care physician (or establish a relationship with a primary care physician if you do not have one) for your aftercare needs so that they can reassess your need for medications and monitor your lab values.     Allergies  Allergen Reactions  . Codeine Nausea Only  . Penicillins   . Sulfa Antibiotics Rash      Disposition: SNF   Consults:     Significant Diagnostic Studies:  Dg Lumbar Spine Complete  Result Date: 03/26/2017 CLINICAL DATA:  Pt fell from attic at 1 pm today. Pt c/o right heel and lateral ankle pain, left ankle pain with medial bruising and swelling, and lower back pain. Only  previous sx is to right great toe. EXAM: LUMBAR SPINE - COMPLETE 4+ VIEW COMPARISON:  None. FINDINGS: Minimal scoliosis which may be positional in nature. Alignment is otherwise normal. No evidence of acute vertebral body subluxation. No fracture line or displaced fracture fragment seen. No compression fracture deformity. Minimal degenerative spurring within the upper and mid lumbar spine. No evidence of advanced degenerative change at any level. Facet joints appear intact and normally aligned. Upper sacrum appears intact and normally aligned. Paravertebral soft tissues are unremarkable. IMPRESSION: No acute findings. No fracture or acute subluxation within the lumbar spine. Electronically Signed   By: Franki Cabot M.D.   On: 03/26/2017 16:09   Dg Ankle 2 Views Right  Result Date: 03/29/2017 CLINICAL DATA:  Right ankle fracture due to a fall through and attic floor 03/26/2017. Initial encounter. EXAM: RIGHT ANKLE -  2 VIEW COMPARISON:  CT right ankle on plain films right ankle 03/26/2017. FINDINGS: Distal fibular and calcaneus fractures are again identified. The patient is in a new fiberglass splint. Position and alignment are not notably changed. Soft tissue swelling about the ankle has increased compared to the prior plain films. IMPRESSION: No change in position and alignment of distal fibular and calcaneal fractures with the patient in a splint. Soft tissue swelling about the ankle has increased. Electronically Signed   By: Inge Rise M.D.   On: 03/29/2017 15:27   Dg Ankle Complete Left  Result Date: 03/26/2017 CLINICAL DATA:  Insert Clinic EXAM: LEFT ANKLE COMPLETE - 3+ VIEW COMPARISON:  None. FINDINGS: Displaced fracture within the medial malleolus, with lateral displacement of the fractured medial malleolus, and medial displacement of the distal tibia relative to the underlying talus. Associated distortion of the ankle mortise. Additional displaced fracture within the upper portion of the lateral  malleolus. O fracture seen within the talar dome. No fracture seen within the visualized portions of the hindfoot or midfoot. IMPRESSION: 1. Displaced fracture within the medial malleolus. Associated medial displacement of the distal tibia relative to the underlying talus. Associated distortion of the ankle mortise. 2. Additional displaced fracture within the distal fibula. Electronically Signed   By: Franki Cabot M.D.   On: 03/26/2017 16:08   Dg Ankle Complete Right  Result Date: 03/26/2017 CLINICAL DATA:  Pt fell from attic at 1 pm today. Pt c/o right heel and lateral ankle pain, left ankle pain with medial bruising and swelling, and lower back pain. Only previous sx is to right great toe. EXAM: RIGHT ANKLE - COMPLETE 3+ VIEW COMPARISON:  None. FINDINGS: Displaced/comminuted fracture within the distal right fibula. Distal tibia appears intact and normally aligned. Ankle mortise is symmetric. Displaced/comminuted fractures within the posterior calcaneus. Probable associated diastases at the overlying subtalar joint spaces. IMPRESSION: 1. Displaced/comminuted fracture within the distal right fibula. 2. Displaced/comminuted fractures within the posterior calcaneus. Probable associated diastases at the overlying subtalar joint spaces. Consider CT for more definitive characterization of fracture extent and osseous alignment. Electronically Signed   By: Franki Cabot M.D.   On: 03/26/2017 16:05   Ct Head Wo Contrast  Result Date: 03/26/2017 CLINICAL DATA:  Status post fall. EXAM: CT HEAD WITHOUT CONTRAST CT CERVICAL SPINE WITHOUT CONTRAST TECHNIQUE: Multidetector CT imaging of the head and cervical spine was performed following the standard protocol without intravenous contrast. Multiplanar CT image reconstructions of the cervical spine were also generated. COMPARISON:  None. FINDINGS: CT HEAD FINDINGS Brain: Ventricles are normal in size and configuration. There is no hemorrhage, edema or other evidence of acute  parenchymal abnormality. No extra-axial hemorrhage. Vascular: No hyperdense vessel or unexpected calcification. Skull: Normal. Negative for fracture or focal lesion. Sinuses/Orbits: No acute finding. Other: Soft tissue edema/laceration overlying the posterior left parietal-occipital bone. No underlying fracture. Slight deformity of the left nasal bone, likely chronic. CT CERVICAL SPINE FINDINGS Alignment: Mild straightening of the normal cervical lordosis related to underlying degenerative change. No evidence of acute vertebral body subluxation. Skull base and vertebrae: No fracture line or displaced fracture fragment identified. Facet joints appear intact and normally aligned throughout. Soft tissues and spinal canal: No prevertebral fluid or swelling. No visible canal hematoma. Disc levels: Mild degenerative spurring throughout the mid and lower cervical spine, with associated mild disc-osteophytic bulges at the C3-4 through C6-7 levels. No more than mild central canal stenosis at any level. Upper chest: Nodular consolidation within the medial aspects  of the left lung apex, measuring 8 mm (series 302, image 90), possibly a semi-solid nodule, too small to definitively characterize. Other: Subcentimeter hypodense focus within the right thyroid lobe, most likely a benign colloid nodule. Carotid atherosclerosis. IMPRESSION: 1. Soft tissue edema/laceration overlying the left posterior parietal-occipital bone. No underlying fracture. 2. No acute intracranial abnormality. No intracranial hemorrhage or edema. 3. No fracture or acute subluxation within the cervical spine. Degenerative changes of the cervical spine, as detailed above. 4. **An incidental finding of potential clinical significance has been found. Nodular consolidation within the left lung apex, measuring 8 mm, possibly semi-solid nodule, alternatively chronic scarring or atelectasis. Follow-up non-contrast CT recommended at 3-6 months to confirm persistence.  If unchanged, and solid component remains <6 mm, annual CT is recommended until 5 years of stability has been established. If persistent these nodules should be considered highly suspicious if the solid component of the nodule is 6 mm or greater in size and enlarging. This recommendation follows the consensus statement: Guidelines for Management of Incidental Pulmonary Nodules Detected on CT Images: From the Fleischner Society 2017; Radiology 2017; 284:228-243.** 5. Carotid atherosclerosis. Electronically Signed   By: Franki Cabot M.D.   On: 03/26/2017 17:28   Ct Cervical Spine Wo Contrast  Result Date: 03/26/2017 CLINICAL DATA:  Status post fall. EXAM: CT HEAD WITHOUT CONTRAST CT CERVICAL SPINE WITHOUT CONTRAST TECHNIQUE: Multidetector CT imaging of the head and cervical spine was performed following the standard protocol without intravenous contrast. Multiplanar CT image reconstructions of the cervical spine were also generated. COMPARISON:  None. FINDINGS: CT HEAD FINDINGS Brain: Ventricles are normal in size and configuration. There is no hemorrhage, edema or other evidence of acute parenchymal abnormality. No extra-axial hemorrhage. Vascular: No hyperdense vessel or unexpected calcification. Skull: Normal. Negative for fracture or focal lesion. Sinuses/Orbits: No acute finding. Other: Soft tissue edema/laceration overlying the posterior left parietal-occipital bone. No underlying fracture. Slight deformity of the left nasal bone, likely chronic. CT CERVICAL SPINE FINDINGS Alignment: Mild straightening of the normal cervical lordosis related to underlying degenerative change. No evidence of acute vertebral body subluxation. Skull base and vertebrae: No fracture line or displaced fracture fragment identified. Facet joints appear intact and normally aligned throughout. Soft tissues and spinal canal: No prevertebral fluid or swelling. No visible canal hematoma. Disc levels: Mild degenerative spurring throughout  the mid and lower cervical spine, with associated mild disc-osteophytic bulges at the C3-4 through C6-7 levels. No more than mild central canal stenosis at any level. Upper chest: Nodular consolidation within the medial aspects of the left lung apex, measuring 8 mm (series 302, image 90), possibly a semi-solid nodule, too small to definitively characterize. Other: Subcentimeter hypodense focus within the right thyroid lobe, most likely a benign colloid nodule. Carotid atherosclerosis. IMPRESSION: 1. Soft tissue edema/laceration overlying the left posterior parietal-occipital bone. No underlying fracture. 2. No acute intracranial abnormality. No intracranial hemorrhage or edema. 3. No fracture or acute subluxation within the cervical spine. Degenerative changes of the cervical spine, as detailed above. 4. **An incidental finding of potential clinical significance has been found. Nodular consolidation within the left lung apex, measuring 8 mm, possibly semi-solid nodule, alternatively chronic scarring or atelectasis. Follow-up non-contrast CT recommended at 3-6 months to confirm persistence. If unchanged, and solid component remains <6 mm, annual CT is recommended until 5 years of stability has been established. If persistent these nodules should be considered highly suspicious if the solid component of the nodule is 6 mm or greater in size and  enlarging. This recommendation follows the consensus statement: Guidelines for Management of Incidental Pulmonary Nodules Detected on CT Images: From the Fleischner Society 2017; Radiology 2017; 284:228-243.** 5. Carotid atherosclerosis. Electronically Signed   By: Franki Cabot M.D.   On: 03/26/2017 17:28   Ct Ankle Right Wo Contrast  Result Date: 03/26/2017 CLINICAL DATA:  Right ankle fracture after falling through attic floor landing on both feet. EXAM: CT OF THE RIGHT ANKLE WITHOUT CONTRAST TECHNIQUE: Multidetector CT imaging of the right ankle was performed according to  the standard protocol. Multiplanar CT image reconstructions were also generated. COMPARISON:  03/26/2017 ankle radiographs FINDINGS: Bones/Joint/Cartilage 1. Closed, comminuted distal fibular diaphyseal fracture 2.5 cm from the ankle mortise. Slight lateral angulation of the main distal fracture fragment with buckling and 3 mm of lateral displacement of a small butterfly fracture fragment. 2. 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 No encroachment on the sinus tarsi. 3. Fracture along the long axis of the calcaneus is noted on the sagittal reformats through the body and posterior calcaneus with a coronal fracture fragment extending to the plantar surface off this fracture (series 201, image 17) and a second extending through the posterior calcaneus (series 204, image 24). 3 mm of offset is noted at the posterior subtalar joint (series 204, image 20). 4. Tiny ossific density off the plantar dorsal aspect of the cuboid may represent a small avulsion, age indeterminate given its slightly rounded margins. 5. The ankle mortise is maintained. No distal tibial fracture is seen. The midfoot articulations are appear congruent. Ligaments Suboptimally assessed by CT. Muscles and Tendons Intact peroneal tendons. Intact Achilles. Intact extensor tendons crossing the ankle joint. Intact tibialis posterior and flexor digitorum longus tendons. The flexor hallucis is adjacent to one of the fracture lines medially and there is potential for entrapment, series 202, image 64. Soft tissues Diffuse soft tissue swelling along the dorsal plantar aspect of the foot. IMPRESSION: 1. Acute, closed, distal fibular diaphyseal fracture. 2. 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 3. Age-indeterminate tiny ossific density off the plantar dorsal aspect of the cuboid. Electronically Signed   By: Ashley Royalty M.D.   On: 03/26/2017 18:08   Dg Ankle Left Port  Result Date: 03/26/2017 CLINICAL DATA:  Status post reduction of left ankle fracture. Initial encounter. EXAM: PORTABLE LEFT ANKLE - 2 VIEW COMPARISON:  Left ankle radiographs performed earlier today at 3:08 p.m. FINDINGS: There is improved alignment of the medial malleolar fracture. A minimally displaced distal fibular fracture is again noted. There is mild residual lateral tilt of the talus, with mild widening of the medial aspect of the ankle mortise. Mild surrounding soft tissue swelling is noted. A splint is noted about the lower leg and ankle. IMPRESSION: Improved alignment of the medial malleolar fracture. Minimally displaced distal fibular fracture again noted. Mild residual lateral tilt of the talus, with mild widening of the medial aspect of the ankle mortise. Electronically Signed   By: Garald Balding M.D.   On: 03/26/2017 20:04   Dg Foot Complete Left  Result Date: 03/26/2017 CLINICAL DATA:  [facility] EXAM: LEFT FOOT - COMPLETE 3+ VIEW COMPARISON:  None. FINDINGS: No fracture line or displaced fracture  fragment seen within the hindfoot, midfoot or forefoot. Displaced fractures of the medial malleolus and lateral malleolus are better demonstrated on the dedicated ankle exam. IMPRESSION: Negative left foot. Electronically Signed   By: Franki Cabot M.D.   On: 03/26/2017 16:10   Dg Foot Complete Right  Result Date: 03/26/2017 CLINICAL DATA:  Pt fell from attic at 1 pm today. Pt c/o right heel and lateral ankle pain, left ankle pain with medial bruising and swelling, and lower back pain. Only previous sx is to right great toe. EXAM: RIGHT FOOT COMPLETE - 3+ VIEW COMPARISON:  None. FINDINGS: Displaced/comminuted fractures within the posterior calcaneus.  Probable associated diastases at the overlying subtalar joint spaces. IMPRESSION: Displaced/comminuted fractures within the posterior calcaneus. Probable associated diastases at the overlying subtalar joint spaces. Consider CT for more definitive characterization of fracture extent and osseous alignment. Electronically Signed   By: Franki Cabot M.D.   On: 03/26/2017 16:04             Filed Weights   03/26/17 1943  Weight: 59.4 kg (130 lb 15.3 oz)     Microbiology: Recent Results (from the past 240 hour(s))  MRSA PCR Screening     Status: None   Collection Time: 03/27/17  4:31 AM  Result Value Ref Range Status   MRSA by PCR NEGATIVE NEGATIVE Final    Comment:        The GeneXpert MRSA Assay (FDA approved for NASAL specimens only), is one component of a comprehensive MRSA colonization surveillance program. It is not intended to diagnose MRSA infection nor to guide or monitor treatment for MRSA infections.        Blood Culture No results found for: SDES, SPECREQUEST, CULT, REPTSTATUS    Labs: Results for orders placed or performed during the hospital encounter of 03/26/17 (from the past 48 hour(s))  Magnesium     Status: None   Collection Time: 03/28/17 10:01 AM  Result Value Ref Range   Magnesium 1.8 1.7 - 2.4 mg/dL  Basic metabolic panel     Status: Abnormal   Collection Time: 03/30/17  4:56 AM  Result Value Ref Range   Sodium 141 135 - 145 mmol/L   Potassium 3.8 3.5 - 5.1 mmol/L   Chloride 107 101 - 111 mmol/L   CO2 25 22 - 32 mmol/L   Glucose, Bld 113 (H) 65 - 99 mg/dL   BUN 6 6 - 20 mg/dL   Creatinine, Ser 0.75 0.44 - 1.00 mg/dL   Calcium 8.7 (L) 8.9 - 10.3 mg/dL   GFR calc non Af Amer >60 >60 mL/min   GFR calc Af Amer >60 >60 mL/min    Comment: (NOTE) The eGFR has been calculated using the CKD EPI equation. This calculation has not been validated in all clinical situations. eGFR's persistently <60 mL/min signify possible Chronic Kidney Disease.     Anion gap 9 5 - 15  CBC     Status: None   Collection Time: 03/30/17  4:56 AM  Result Value Ref Range   WBC 8.2 4.0 - 10.5 K/uL   RBC 4.17 3.87 - 5.11 MIL/uL   Hemoglobin 12.8 12.0 - 15.0 g/dL   HCT 37.9 36.0 - 46.0 %   MCV 90.9 78.0 - 100.0 fL   MCH 30.7 26.0 - 34.0 pg   MCHC 33.8 30.0 - 36.0 g/dL   RDW 13.4 11.5 - 15.5 %   Platelets 169 150 - 400 K/uL        Lab  Results  Component Value Date   CREATININE 0.75 03/30/2017     HPI :    66 y.o. female with medical history significant of tobacco abuse, hyperlipidemia, GERD, vertigo, headache, IBS, mental illness (patient does not know the diagnosis, taking perphenazine), who presents with fall, injury to both ankles and right foot.  Pt states that she fell off from her attic that collapsed (about 10 ft high). She landed on her feet first onto concrete and then fell back and hit the back of her head on the wall. No LOC. No prodromal symptoms. She states that this is slowly an accident. She developed severe pain in both ankle and right heel. No headache or neck pain. The pain is constant, 8 out of 10 in severity, sharp, nonradiating. It is aggravated by movement. Patient denies chest pain, cough, shortness of breath. No GI symptoms. No symptoms of UTI. No leg numbness or unilateral weakness.  ED Course: pt was found to have  WBC 18.5, electrolytes renal function okay, temperature normal, no tachycardia, oxygen saturation 95% on room air. X-ray of ankle and right foot showed bilateral ankle fracture and right calcaneus fracture. X-ray of lumbar spin negative. CT-head and C spin negative for bony fracture, but incidentally showed a 8 mm nodule in left lung apex   HOSPITAL COURSE:    Fracture of bilateral ankle, and right calcaneal bone: Left Medial and lateral maleolar fracture S/P ORIF 03/27/2017. Nonweightbearing. Orthopedic surgeon was consulted. Dr. Percell Miller   Pain control: morphine prn and percocet When necessary Zofran for  nausea Robaxin for muscle spasm PT/OT evaluation- SNF,Nonweightbearing.     Right Fibula and calcaneous fracture Nonweightbearing. As per orthopedics Plan for operative fixation after acute swelling has decreased-planning for 04/05/2017.   Leukocytosis: Likely due to stress-induced demargination. Patient does not have signs of infection. Leukocytosis resolved  Negative  UA  Vertigo: -Continue when necessary meclizine  HLD (hyperlipidemia): -Continue pravastatin  IBS:No nausea, vomiting, abdominal pain or diarrhea -Continue home Viber[employer] if available on formulary   Fall: due to accident. No prodromal symptoms. -PT/OT recommend SNF  GERD: -Protonix  Lung nodule: An incidental finding, Nodular consolidation within the left lung apex, measuring 8 mm, possibly semi-solid nodule, alternatively chronic scarring or atelectasis.  need to follow-up with PCP to get non-contrast CT at 3-6 months to confirm persistence, to be arranged for by PCP.    Discharge Exam:   Blood pressure (!) 153/61, pulse 87, temperature 98.1 F (36.7 C), temperature source Oral, resp. rate 15, weight 59.4 kg (130 lb 15.3 oz), SpO2 92 %.  General exam: Appears calm and comfortable  Respiratory system: Clear to auscultation. Respiratory effort normal. Cardiovascular system: S1 & S2 heard, RRR. No JVD, murmurs, rubs, gallops or clicks. No pedal edema. Gastrointestinal system: Abdomen is nondistended, soft and nontender. No organomegaly or masses felt. Normal bowel sounds heard. Central nervous system: Alert and oriented. No focal neurological deficits    Follow-up Information    MURPHY, TIMOTHY D, MD Follow up in 2 week(s).   Specialty:  Orthopedic Surgery Contact information: [address] [id number] [phone]        Gerrit Heck, MD. Call.   Specialty:  Family Medicine Why:  For hospital follow-up Contact information: [place] [address] [phone]           Signed: Reyne Dumas 03/30/2017, 8:32 AM        Time spent >45 mins

## 2017-04-01 DIAGNOSIS — F17290 Nicotine dependence, other tobacco product, uncomplicated: Secondary | ICD-10-CM | POA: Diagnosis not present

## 2017-04-01 DIAGNOSIS — G43909 Migraine, unspecified, not intractable, without status migrainosus: Secondary | ICD-10-CM | POA: Diagnosis not present

## 2017-04-01 DIAGNOSIS — K581 Irritable bowel syndrome with constipation: Secondary | ICD-10-CM | POA: Diagnosis not present

## 2017-04-01 DIAGNOSIS — E782 Mixed hyperlipidemia: Secondary | ICD-10-CM | POA: Diagnosis not present

## 2017-04-01 DIAGNOSIS — S82892A Other fracture of left lower leg, initial encounter for closed fracture: Secondary | ICD-10-CM | POA: Diagnosis not present

## 2017-04-04 ENCOUNTER — Encounter (HOSPITAL_COMMUNITY): Payer: Self-pay | Admitting: *Deleted

## 2017-04-04 NOTE — Progress Notes (Addendum)
Ms Bischof is allergic to Pencillin, I in boxed messaged Martensen III, PA to request another antibiotic.  Mr Harvie Heck, [place] responded by in box note; "No thank you, She tolerated Ancef during recent surgery and hospitalization."

## 2017-04-04 NOTE — Pre-Procedure Instructions (Signed)
    Nichole Delgado  04/04/2017    Your procedure is scheduled on Tuesday, April 17.  Report to [facility] Admitting at 7:50 AM                 Your surgery or procedure is scheduled for 10:25 AM   Call this number if you have problems the morning of [phone]    Remember:  Do not eat food or drink liquids after midnight.  Take these medicines the morning of surgery with A SIP OF WATER : - May use Flonase.  If Needed and she tolerates on an empty stomach: :Oxycodone- Acetaminophen, Baclofen or Methocarbamol, Ondanestron, Meclizine, Omeprazole, Rizatriptan, Alprazolam.              Please send current Medication Record with medications that were given documented.   Do not wear jewelry, make-up or nail polish.  Do not wear lotions, powders, or perfumes, or deoderant.  Do not shave 48 hours prior to surgery.  Men may shave face and neck.  Do not bring valuables to the hospital.  [facility] is not responsible for any belongings or valuables.  Contacts, dentures or bridgework may not be worn into surgery.  Leave your suitcase in the car.  After surgery it may be brought to your room.

## 2017-04-05 ENCOUNTER — Ambulatory Visit (HOSPITAL_COMMUNITY)
Admission: RE | Admit: 2017-04-05 | Discharge: 2017-04-05 | Disposition: A | Discharge status: Rehabilitation Facility | Payer: Medicare Other | Source: Ambulatory Visit | Attending: Orthopedic Surgery | Admitting: Orthopedic Surgery

## 2017-04-05 ENCOUNTER — Encounter (HOSPITAL_COMMUNITY)
Admission: RE | Disposition: A | Discharge status: Rehabilitation Facility | Payer: Self-pay | Source: Ambulatory Visit | Attending: Orthopedic Surgery

## 2017-04-05 ENCOUNTER — Inpatient Hospital Stay (HOSPITAL_COMMUNITY): Payer: Medicare Other | Admitting: Anesthesiology

## 2017-04-05 DIAGNOSIS — D72829 Elevated white blood cell count, unspecified: Secondary | ICD-10-CM | POA: Diagnosis not present

## 2017-04-05 DIAGNOSIS — E785 Hyperlipidemia, unspecified: Secondary | ICD-10-CM | POA: Diagnosis not present

## 2017-04-05 DIAGNOSIS — S82841A Displaced bimalleolar fracture of right lower leg, initial encounter for closed fracture: Secondary | ICD-10-CM | POA: Insufficient documentation

## 2017-04-05 DIAGNOSIS — F419 Anxiety disorder, unspecified: Secondary | ICD-10-CM | POA: Insufficient documentation

## 2017-04-05 DIAGNOSIS — S82891A Other fracture of right lower leg, initial encounter for closed fracture: Secondary | ICD-10-CM | POA: Diagnosis present

## 2017-04-05 DIAGNOSIS — W1789XA Other fall from one level to another, initial encounter: Secondary | ICD-10-CM | POA: Insufficient documentation

## 2017-04-05 DIAGNOSIS — S92001A Unspecified fracture of right calcaneus, initial encounter for closed fracture: Secondary | ICD-10-CM | POA: Diagnosis not present

## 2017-04-05 DIAGNOSIS — G8918 Other acute postprocedural pain: Secondary | ICD-10-CM | POA: Diagnosis not present

## 2017-04-05 DIAGNOSIS — G43909 Migraine, unspecified, not intractable, without status migrainosus: Secondary | ICD-10-CM | POA: Insufficient documentation

## 2017-04-05 DIAGNOSIS — Z882 Allergy status to sulfonamides status: Secondary | ICD-10-CM | POA: Diagnosis not present

## 2017-04-05 DIAGNOSIS — Z87891 Personal history of nicotine dependence: Secondary | ICD-10-CM | POA: Insufficient documentation

## 2017-04-05 DIAGNOSIS — Z6825 Body mass index (BMI) 25.0-25.9, adult: Secondary | ICD-10-CM | POA: Diagnosis not present

## 2017-04-05 DIAGNOSIS — E46 Unspecified protein-calorie malnutrition: Secondary | ICD-10-CM | POA: Diagnosis not present

## 2017-04-05 DIAGNOSIS — S92061A Displaced intraarticular fracture of right calcaneus, initial encounter for closed fracture: Secondary | ICD-10-CM | POA: Diagnosis not present

## 2017-04-05 DIAGNOSIS — Z885 Allergy status to narcotic agent status: Secondary | ICD-10-CM | POA: Insufficient documentation

## 2017-04-05 DIAGNOSIS — R42 Dizziness and giddiness: Secondary | ICD-10-CM | POA: Insufficient documentation

## 2017-04-05 DIAGNOSIS — Z79899 Other long term (current) drug therapy: Secondary | ICD-10-CM | POA: Diagnosis not present

## 2017-04-05 DIAGNOSIS — Y92008 Other place in unspecified non-institutional (private) residence as the place of occurrence of the external cause: Secondary | ICD-10-CM | POA: Insufficient documentation

## 2017-04-05 DIAGNOSIS — K219 Gastro-esophageal reflux disease without esophagitis: Secondary | ICD-10-CM | POA: Insufficient documentation

## 2017-04-05 DIAGNOSIS — S82892A Other fracture of left lower leg, initial encounter for closed fracture: Secondary | ICD-10-CM

## 2017-04-05 DIAGNOSIS — Z880 Allergy status to penicillin: Secondary | ICD-10-CM | POA: Insufficient documentation

## 2017-04-05 HISTORY — PX: ORIF CALCANEOUS FRACTURE: SHX5030

## 2017-04-05 LAB — CBC
HEMATOCRIT: 40.5 % (ref 36.0–46.0)
HEMOGLOBIN: 13.6 g/dL (ref 12.0–15.0)
MCH: 30.4 pg (ref 26.0–34.0)
MCHC: 33.6 g/dL (ref 30.0–36.0)
MCV: 90.6 fL (ref 78.0–100.0)
Platelets: 320 10*3/uL (ref 150–400)
RBC: 4.47 MIL/uL (ref 3.87–5.11)
RDW: 13.6 % (ref 11.5–15.5)
WBC: 9.3 10*3/uL (ref 4.0–10.5)

## 2017-04-05 SURGERY — OPEN REDUCTION INTERNAL FIXATION (ORIF) CALCANEOUS FRACTURE
Anesthesia: General | Laterality: Right

## 2017-04-05 MED ORDER — ACETAMINOPHEN 500 MG PO TABS
1000.0000 mg | ORAL_TABLET | Freq: Once | ORAL | Status: AC
  Administered 2017-04-05: 1000 mg via ORAL
  Filled 2017-04-05: qty 2

## 2017-04-05 MED ORDER — DEXAMETHASONE SODIUM PHOSPHATE 10 MG/ML IJ SOLN
INTRAMUSCULAR | Status: DC | PRN
  Administered 2017-04-05: 10 mg via INTRAVENOUS

## 2017-04-05 MED ORDER — LIDOCAINE HCL (CARDIAC) 20 MG/ML IV SOLN
INTRAVENOUS | Status: DC | PRN
  Administered 2017-04-05: 80 mg via INTRAVENOUS

## 2017-04-05 MED ORDER — CEFAZOLIN SODIUM-DEXTROSE 2-4 GM/100ML-% IV SOLN
2.0000 g | INTRAVENOUS | Status: AC
  Administered 2017-04-05: 2 g via INTRAVENOUS
  Filled 2017-04-05: qty 100

## 2017-04-05 MED ORDER — PROPOFOL 10 MG/ML IV BOLUS
INTRAVENOUS | Status: DC | PRN
  Administered 2017-04-05: 130 mg via INTRAVENOUS

## 2017-04-05 MED ORDER — MIDAZOLAM HCL 5 MG/5ML IJ SOLN
INTRAMUSCULAR | Status: DC | PRN
  Administered 2017-04-05: 2 mg via INTRAVENOUS

## 2017-04-05 MED ORDER — OXYCODONE-ACETAMINOPHEN 5-325 MG PO TABS: 1.0000 | ORAL_TABLET | ORAL | 0 refills | Status: DC | PRN

## 2017-04-05 MED ORDER — BUPIVACAINE-EPINEPHRINE (PF) 0.5% -1:200000 IJ SOLN
INTRAMUSCULAR | Status: DC | PRN
  Administered 2017-04-05: 25 mL via PERINEURAL

## 2017-04-05 MED ORDER — GABAPENTIN 300 MG PO CAPS
300.0000 mg | ORAL_CAPSULE | Freq: Once | ORAL | Status: AC
  Administered 2017-04-05: 300 mg via ORAL
  Filled 2017-04-05: qty 1

## 2017-04-05 MED ORDER — MIDAZOLAM HCL 2 MG/2ML IJ SOLN
INTRAMUSCULAR | Status: AC
  Filled 2017-04-05: qty 2

## 2017-04-05 MED ORDER — EPHEDRINE 5 MG/ML INJ
INTRAVENOUS | Status: AC
  Filled 2017-04-05: qty 10

## 2017-04-05 MED ORDER — FENTANYL CITRATE (PF) 250 MCG/5ML IJ SOLN
INTRAMUSCULAR | Status: AC
  Filled 2017-04-05: qty 5

## 2017-04-05 MED ORDER — ENOXAPARIN SODIUM 40 MG/0.4ML ~~LOC~~ SOLN: 40.0000 mg | SUBCUTANEOUS | 0 refills | Status: DC

## 2017-04-05 MED ORDER — ONDANSETRON HCL 4 MG/2ML IJ SOLN
INTRAMUSCULAR | Status: DC | PRN
  Administered 2017-04-05: 4 mg via INTRAVENOUS

## 2017-04-05 MED ORDER — EPHEDRINE SULFATE 50 MG/ML IJ SOLN
INTRAMUSCULAR | Status: DC | PRN
  Administered 2017-04-05 (×2): 10 mg via INTRAVENOUS

## 2017-04-05 MED ORDER — FENTANYL CITRATE (PF) 100 MCG/2ML IJ SOLN
INTRAMUSCULAR | Status: DC | PRN
  Administered 2017-04-05 (×5): 50 ug via INTRAVENOUS

## 2017-04-05 MED ORDER — LACTATED RINGERS IV SOLN
INTRAVENOUS | Status: DC
  Administered 2017-04-05: 10 mL/h via INTRAVENOUS
  Administered 2017-04-05: 11:00:00 via INTRAVENOUS

## 2017-04-05 MED ORDER — DEXAMETHASONE SODIUM PHOSPHATE 10 MG/ML IJ SOLN
INTRAMUSCULAR | Status: AC
  Filled 2017-04-05: qty 1

## 2017-04-05 MED ORDER — FENTANYL CITRATE (PF) 100 MCG/2ML IJ SOLN
INTRAMUSCULAR | Status: AC
  Administered 2017-04-05: 50 ug
  Filled 2017-04-05: qty 2

## 2017-04-05 MED ORDER — SUCCINYLCHOLINE CHLORIDE 200 MG/10ML IV SOSY
PREFILLED_SYRINGE | INTRAVENOUS | Status: AC
  Filled 2017-04-05: qty 10

## 2017-04-05 MED ORDER — CHLORHEXIDINE GLUCONATE 4 % EX LIQD: 60.0000 mL | Freq: Once | CUTANEOUS | Status: DC

## 2017-04-05 MED ORDER — FENTANYL CITRATE (PF) 100 MCG/2ML IJ SOLN
25.0000 ug | INTRAMUSCULAR | Status: DC | PRN
Start: 2017-04-05 — End: 2017-04-05

## 2017-04-05 MED ORDER — ONDANSETRON HCL 4 MG/2ML IJ SOLN
INTRAMUSCULAR | Status: AC
  Filled 2017-04-05: qty 2

## 2017-04-05 MED ORDER — ONDANSETRON HCL 4 MG PO TABS: 4.0000 mg | ORAL_TABLET | Freq: Three times a day (TID) | ORAL | 0 refills | Status: DC | PRN

## 2017-04-05 MED ORDER — BACLOFEN 10 MG PO TABS: 10.0000 mg | ORAL_TABLET | Freq: Three times a day (TID) | ORAL | 0 refills | Status: DC | PRN

## 2017-04-05 MED ORDER — MIDAZOLAM HCL 2 MG/2ML IJ SOLN
INTRAMUSCULAR | Status: AC
  Administered 2017-04-05: 1 mg
  Filled 2017-04-05: qty 2

## 2017-04-05 MED ORDER — PROPOFOL 10 MG/ML IV BOLUS
INTRAVENOUS | Status: AC
  Filled 2017-04-05: qty 20

## 2017-04-05 MED ORDER — DOCUSATE SODIUM 100 MG PO CAPS: 100.0000 mg | ORAL_CAPSULE | Freq: Two times a day (BID) | ORAL | 0 refills | Status: DC

## 2017-04-05 MED ORDER — 0.9 % SODIUM CHLORIDE (POUR BTL) OPTIME
TOPICAL | Status: DC | PRN
  Administered 2017-04-05: 1000 mL

## 2017-04-05 SURGICAL SUPPLY — 68 items
BANDAGE ACE 4X5 VEL STRL LF (GAUZE/BANDAGES/DRESSINGS) ×2 IMPLANT
BANDAGE ACE 6X5 VEL STRL LF (GAUZE/BANDAGES/DRESSINGS) ×2 IMPLANT
BANDAGE ESMARK 6X9 LF (GAUZE/BANDAGES/DRESSINGS) ×1 IMPLANT
BIT DRILL 2.5X2.75 QC CALB (BIT) ×2 IMPLANT
BIT DRILL 3.5X5.5 QC CALB (BIT) ×2 IMPLANT
BIT DRILL CALIBRATED 2.7 (BIT) ×2 IMPLANT
BNDG COHESIVE 6X5 TAN STRL LF (GAUZE/BANDAGES/DRESSINGS) ×4 IMPLANT
BNDG ESMARK 6X9 LF (GAUZE/BANDAGES/DRESSINGS) ×2
BNDG GAUZE STRTCH 6 (GAUZE/BANDAGES/DRESSINGS) ×6 IMPLANT
COTTON STERILE ROLL (GAUZE/BANDAGES/DRESSINGS) ×2 IMPLANT
COVER SURGICAL LIGHT HANDLE (MISCELLANEOUS) ×4 IMPLANT
CUFF TOURNIQUET SINGLE 34IN LL (TOURNIQUET CUFF) IMPLANT
CUFF TOURNIQUET SINGLE 44IN (TOURNIQUET CUFF) IMPLANT
DRAPE HALF SHEET 40X57 (DRAPES) ×2 IMPLANT
DRAPE INCISE IOBAN 66X45 STRL (DRAPES) ×4 IMPLANT
DRAPE OEC MINIVIEW 54X84 (DRAPES) ×2 IMPLANT
DRAPE U-SHAPE 47X51 STRL (DRAPES) ×2 IMPLANT
DRSG ADAPTIC 3X8 NADH LF (GAUZE/BANDAGES/DRESSINGS) ×2 IMPLANT
DRSG PAD ABDOMINAL 8X10 ST (GAUZE/BANDAGES/DRESSINGS) ×6 IMPLANT
DURAPREP 26ML APPLICATOR (WOUND CARE) ×2 IMPLANT
ELECT REM PT RETURN 9FT ADLT (ELECTROSURGICAL) ×2
ELECTRODE REM PT RTRN 9FT ADLT (ELECTROSURGICAL) ×1 IMPLANT
GAUZE SPONGE 4X4 12PLY STRL (GAUZE/BANDAGES/DRESSINGS) ×2 IMPLANT
GAUZE SPONGE 4X4 12PLY STRL LF (GAUZE/BANDAGES/DRESSINGS) ×2 IMPLANT
GLOVE BIO SURGEON STRL SZ7.5 (GLOVE) ×2 IMPLANT
GLOVE BIOGEL PI IND STRL 9 (GLOVE) ×1 IMPLANT
GLOVE BIOGEL PI INDICATOR 9 (GLOVE) ×1
GLOVE SURG ORTHO 9.0 STRL STRW (GLOVE) ×2 IMPLANT
GOWN STRL REUS W/ TWL XL LVL3 (GOWN DISPOSABLE) ×3 IMPLANT
GOWN STRL REUS W/TWL XL LVL3 (GOWN DISPOSABLE) ×3
KIT BASIN OR (CUSTOM PROCEDURE TRAY) ×2 IMPLANT
KIT ROOM TURNOVER OR (KITS) ×2 IMPLANT
MANIFOLD NEPTUNE II (INSTRUMENTS) ×2 IMPLANT
NS IRRIG 1000ML POUR BTL (IV SOLUTION) ×2 IMPLANT
PACK ORTHO EXTREMITY (CUSTOM PROCEDURE TRAY) ×2 IMPLANT
PAD ARMBOARD 7.5X6 YLW CONV (MISCELLANEOUS) ×4 IMPLANT
PAD CAST 4YDX4 CTTN HI CHSV (CAST SUPPLIES) ×2 IMPLANT
PADDING CAST COTTON 4X4 STRL (CAST SUPPLIES) ×2
PADDING CAST COTTON 6X4 STRL (CAST SUPPLIES) ×4 IMPLANT
PIN SHANTZ 5MM (PIN) ×2 IMPLANT
PLATE CALC MIS EXTEND SM RT (Plate) ×2 IMPLANT
PLATE TUB 100DEG 5 HO (Plate) ×2 IMPLANT
SCREW ACE CAN 4.0 36M (Screw) ×2 IMPLANT
SCREW CORT 3.5X32 (Screw) ×1 IMPLANT
SCREW CORT T15 32X3.5XST LCK (Screw) ×1 IMPLANT
SCREW CORTICAL 3.5MM  12MM (Screw) ×1 IMPLANT
SCREW CORTICAL 3.5MM  16MM (Screw) ×2 IMPLANT
SCREW CORTICAL 3.5MM 12MM (Screw) ×1 IMPLANT
SCREW CORTICAL 3.5MM 14MM (Screw) ×6 IMPLANT
SCREW CORTICAL 3.5MM 16MM (Screw) ×2 IMPLANT
SCREW LOCK CORT STAR 3.5X20 (Screw) ×2 IMPLANT
SCREW LOCK CORT STAR 3.5X24 (Screw) ×4 IMPLANT
SCREW LOCK CORT STAR 3.5X28 (Screw) ×4 IMPLANT
SCREW LOCK CORT STAR 3.5X34 (Screw) ×2 IMPLANT
SCREW NL LP 36X3.5 (Screw) ×2 IMPLANT
SPLINT FIBERGLASS 4X30 (CAST SUPPLIES) ×2 IMPLANT
SPONGE LAP 18X18 X RAY DECT (DISPOSABLE) ×2 IMPLANT
STAPLER VISISTAT 35W (STAPLE) IMPLANT
SUCTION FRAZIER HANDLE 10FR (MISCELLANEOUS) ×1
SUCTION TUBE FRAZIER 10FR DISP (MISCELLANEOUS) ×1 IMPLANT
SUT ETHILON 2 0 PSLX (SUTURE) IMPLANT
SUT ETHILON 3 0 FSL (SUTURE) ×4 IMPLANT
SUT VIC AB 2-0 CTB1 (SUTURE) ×4 IMPLANT
TOWEL OR 17X24 6PK STRL BLUE (TOWEL DISPOSABLE) ×2 IMPLANT
TOWEL OR 17X26 10 PK STRL BLUE (TOWEL DISPOSABLE) ×2 IMPLANT
TUBE CONNECTING 12X1/4 (SUCTIONS) ×2 IMPLANT
WATER STERILE IRR 1000ML POUR (IV SOLUTION) ×2 IMPLANT
WIRE K 1.6MM 144256 (MISCELLANEOUS) ×12 IMPLANT

## 2017-04-05 NOTE — Anesthesia Preprocedure Evaluation (Addendum)
Anesthesia Evaluation  Patient identified by MRN, date of birth, ID band Patient awake    Reviewed: Allergy & Precautions, NPO status , Patient's Chart, lab work & pertinent test results  Airway Mallampati: II       Dental   Pulmonary former smoker,    breath sounds clear to auscultation       Cardiovascular  Rhythm:Regular Rate:Normal     Neuro/Psych Foot drop L  Neuromuscular disease    GI/Hepatic Neg liver ROS, GERD  ,  Endo/Other  negative endocrine ROS  Renal/GU negative Renal ROS     Musculoskeletal  (+) Arthritis ,   Abdominal   Peds  Hematology   Anesthesia Other Findings   Reproductive/Obstetrics                            Anesthesia Physical Anesthesia Plan  ASA: II  Anesthesia Plan: General   Post-op Pain Management:    Induction: Intravenous  Airway Management Planned: LMA  Additional Equipment:   Intra-op Plan:   Post-operative Plan: Extubation in OR  Informed Consent: I have reviewed the patients History and Physical, chart, labs and discussed the procedure including the risks, benefits and alternatives for the proposed anesthesia with the patient or authorized representative who has indicated his/her understanding and acceptance.   Dental advisory given  Plan Discussed with: CRNA  Anesthesia Plan Comments:         Anesthesia Quick Evaluation

## 2017-04-05 NOTE — Discharge Instructions (Signed)
Elevate leg as frequently as possible to reduce pain and swelling.    You may loosen ace wrap and re-apply if it feels too tight.  Diet: As you were doing prior to hospitalization   Shower:  You have a splint on, leave the splint in place and keep the splint dry with a plastic bag.  Dressing:  You have a splint, then just leave the splint in place and we will change your bandages during your first follow-up appointment.    Activity:  Increase activity slowly as tolerated, but follow the weight bearing instructions below.  The rules on driving is that you can not be taking narcotics while you drive, and you must feel in control of the vehicle.    Weight Bearing:  Non weight bearing both legs.  To prevent constipation: you may use a stool softener such as -  Colace (over the counter) 100 mg by mouth twice a day  Drink plenty of fluids (prune juice may be helpful) and high fiber foods Miralax (over the counter) for constipation as needed.    Itching:  If you experience itching with your medications, try taking only a single pain pill, or even half a pain pill at a time.  You may take up to 10 pain pills per day, and you can also use benadryl over the counter for itching or also to help with sleep.   Precautions:  If you experience chest pain or shortness of breath - call 911 immediately for transfer to the hospital emergency department!!  If you develop a fever greater that 101 F, purulent drainage from wound, increased redness or drainage from wound, or calf pain -- Call the office at [phone]                                                Follow- Up Appointment:  Please call for an appointment to be seen in 2 weeks [place] - [phone]

## 2017-04-05 NOTE — Anesthesia Procedure Notes (Signed)
Procedure Name: Intubation Date/Time: 04/05/2017 10:49 AM Performed by: Lance Coon Pre-anesthesia Checklist: Patient identified, Emergency Drugs available, Suction available, Patient being monitored and Timeout performed Patient Re-evaluated:Patient Re-evaluated prior to inductionOxygen Delivery Method: Circle system utilized Preoxygenation: Pre-oxygenation with 100% oxygen Intubation Type: IV induction Ventilation: Mask ventilation without difficulty Laryngoscope Size: Miller and 3 Grade View: Grade II Tube type: Oral Tube size: 7.0 mm Number of attempts: 1 Airway Equipment and Method: Stylet Placement Confirmation: ETT inserted through vocal cords under direct vision,  positive ETCO2 and breath sounds checked- equal and bilateral Secured at: 21 cm Tube secured with: Tape Dental Injury: Teeth and Oropharynx as per pre-operative assessment

## 2017-04-05 NOTE — Op Note (Signed)
04/05/2017  12:59 PM  PATIENT:  Nichole Delgado    PRE-OPERATIVE DIAGNOSIS:  ankle and calcaneus fracture  POST-OPERATIVE DIAGNOSIS:  Same  PROCEDURE:  OPEN REDUCTION INTERNAL FIXATION (ORIF) CALCANEOUS AND FIBULA FRACTURE  SURGEON:  Carrell Rahmani, Ernesta Amble, MD  ASSISTANT: Roxan Hockey, PA-C, he was present and scrubbed throughout the case, critical for completion in a timely fashion, and for retraction, instrumentation, and closure.   ANESTHESIA:   gen  PREOPERATIVE INDICATIONS:  Nichole Delgado is a  65 y.o. female with a diagnosis of ankle and calcaneus fracture who failed conservative measures and elected for surgical management.    The risks benefits and alternatives were discussed with the patient preoperatively including but not limited to the risks of infection, bleeding, nerve injury, cardiopulmonary complications, the need for revision surgery, among others, and the patient was willing to proceed.  OPERATIVE IMPLANTS: Biomet 1/3 tubular and lat calc plate  OPERATIVE FINDINGS: comminuted fractures  BLOOD LOSS: min  COMPLICATIONS: none  TOURNIQUET TIME: 133min  OPERATIVE PROCEDURE:  Patient was identified in the preoperative holding area and site was marked by me She was transported to the operating theater and placed on the table in supine position taking care to pad all bony prominences. After a preincinduction time out anesthesia was induced. The RLE extremity was prepped and draped in normal sterile fashion and a pre-incision timeout was performed. She received ancef for preoperative antibiotics.   I placed in the lateral position again padding all bony prominences.  I made a sinus tarsi incision from the distal fibula in line to the cuboid joint. Identified her peroneal tendons and protected these throughout the procedure. I performed a subgaleal sub-periosteal dissection off of the lateral wall of the calcaneus. She had a large posterior piece that was  displaceable I put a Steinmann pin in the posterior aspect of the calcaneus and used this to manipulate the fracture back into alignment. I then reduced the lateral wall and pinned both these into place. Next I placed a percutaneous screw through the posterior aspect of the calcaneus holding it into place.  I selected a plate and tunneled this into position confirming that I was subperiosteal. I then placed a lag screw across the plate after taking multiple x-rays and was happy with position.  I then placed the remaining screws with locking position I did leave a few holes M.D. to percent to avoid damage to previous fixation. I took multiple x-rays as happy with the placement of all hardware and fracture reduction. I then thoroughly irrigated this incision this incision I closed it as well as the percutaneous holes I then assessed her swelling it was appropriate to continue some made a lateral fibular incision dissected the fracture site cleared of soft tissue and performed a closed reduction here.  I then placed a lag screw across the fracture followed by a 5 hole plate with good purchase on all screws. Took multiple x-rays and was happy with the reduction here as well I then thoroughly irrigated this wound and closed the skin in layers. Sterile dressings were applied to both I placed her in a short leg splint she was awoken and taken the PACU in stable condition  POST OPERATIVE PLAN: Nonweightbearing bilateral lower extremities continue chemical DVT prophylaxis

## 2017-04-05 NOTE — H&P (View-Only) (Signed)
   Assessment / Plan: 3 Days Post-Op  S/P Procedure(s) (LRB): OPEN REDUCTION INTERNAL FIXATION (ORIF) BIMALLEOLAR ANKLE FRACTURE (Left) by Dr. Ernesta Amble. Percell Miller on 03/27/2017  Principal Problem:   Closed fracture of both ankles Active Problems:   Vertigo   HLD (hyperlipidemia)   Fall   Tobacco abuse   Right calcaneal fracture   IBS (irritable bowel syndrome)   GERD (gastroesophageal reflux disease)   Bilateral headaches   DDD (degenerative disc disease), lumbosacral   Left foot drop   Hypoalbuminemia due to protein-calorie malnutrition (HCC)   Leukocytosis  Left Medial and lateral maleolar fracture S/P ORIF 03/27/2017. Nonweightbearing.  Right Fibula and calcaneous fracture Nonweightbearing. Plan for operative fixation after acute swelling has decreased-planning for 04/05/2017.  To SNF.  Fit for transfer from an orthopedic perspective.  Return for ORIF Right LE. Incentive spirometer  Elevate legs to reduce pain and swelling Smoking cessation recommended.  Weight Bearing: Non Weight Bearing (NWB) BLE Dressings: PRN.  VTE prophylaxis: Lovenox Dispo: SNF  Subjective: Patient reports pain as moderate / improving. Pain controlled with PO meds.  Tolerating diet.  Urinating.    Objective:   VITALS:   Vitals:   03/28/17 2127 03/29/17 0456 03/29/17 2026 03/30/17 0456  BP: (!) 128/47 123/64 (!) 146/65 (!) 153/61  Pulse: 87 85 77 87  Resp: 17 15 15 15   Temp: 99.4 F (37.4 C) 99.2 F (37.3 C) 98.1 F (36.7 C) 98.1 F (36.7 C)  TempSrc: Oral Oral Oral Oral  SpO2: 98% 91% 97% 92%  Weight:       CBC Latest Ref Rng & Units 03/30/2017 03/28/2017 03/27/2017  WBC 4.0 - 10.5 K/uL 8.2 10.7(H) 11.1(H)  Hemoglobin 12.0 - 15.0 g/dL 12.8 13.5 13.5  Hematocrit 36.0 - 46.0 % 37.9 41.7 40.6  Platelets 150 - 400 K/uL 169 181 224   BMP Latest Ref Rng & Units 03/30/2017 03/28/2017 03/27/2017  Glucose 65 - 99 mg/dL 113(H) 93 149(H)  BUN 6 - 20 mg/dL 6 9 13   Creatinine 0.44 - 1.00 mg/dL  0.75 1.05(H) 0.98  BUN/Creat Ratio 12 - 28 - - -  Sodium 135 - 145 mmol/L 141 143 140  Potassium 3.5 - 5.1 mmol/L 3.8 3.9 3.7  Chloride 101 - 111 mmol/L 107 106 109  CO2 22 - 32 mmol/L 25 27 25   Calcium 8.9 - 10.3 mg/dL 8.7(L) 8.4(L) 8.5(L)   Intake/Output      04/10 0701 - 04/11 0700 04/11 0701 - 04/12 0700   P.O. 960    Total Intake(mL/kg) 960 (16.2)    Urine (mL/kg/hr) 1400 (1)    Total Output 1400     Net -440          Urine Occurrence 4 x      Physical Exam: General: NAD.  Supine in bed. Conversant. MSK Bilateral ankles splinted. Toes warm. EHL FHL intact, painless. Sensation intact distally  Prudencio Burly III, PA-C 03/30/2017, 7:59 AM

## 2017-04-05 NOTE — Progress Notes (Signed)
I called report to [place] at [facility] where pt is to return to per Erenest Blank PA.

## 2017-04-05 NOTE — Anesthesia Postprocedure Evaluation (Signed)
Anesthesia Post Note  Patient: [name]  Procedure(s) Performed: Procedure(s) (LRB): OPEN REDUCTION INTERNAL FIXATION (ORIF) CALCANEOUS AND FIBULA FRACTURE (Right)  Patient location during evaluation: PACU Anesthesia Type: General and Regional Level of consciousness: awake and alert Pain management: pain level controlled Vital Signs Assessment: post-procedure vital signs reviewed and stable Respiratory status: spontaneous breathing, nonlabored ventilation and respiratory function stable Cardiovascular status: blood pressure returned to baseline and stable Postop Assessment: no signs of nausea or vomiting Anesthetic complications: no       Last Vitals:  Vitals:   04/05/17 1425 04/05/17 1432  BP: (!) 129/58 122/72  Pulse:  96  Resp:  18  Temp:      Last Pain:  Vitals:   04/05/17 1432  PainSc: 0-No pain        RLE Motor Response: Responds to commands (wiggles toes) (04/05/17 1432) RLE Sensation: Full sensation (04/05/17 1432)      Vara Mairena,W. EDMOND

## 2017-04-05 NOTE — Anesthesia Procedure Notes (Addendum)
Anesthesia Regional Block: Popliteal block   Pre-Anesthetic Checklist: ,, timeout performed, Correct Patient, Correct Site, Correct Laterality, Correct Procedure, Correct Position, site marked, Risks and benefits discussed,  Surgical consent,  Pre-op evaluation,  At surgeon's request and post-op pain management  Laterality: Right and Lower  Prep: chloraprep       Needles:   Needle Type: Echogenic Stimulator Needle     Needle Length: 9cm  Needle Gauge: 21   Needle insertion depth: 5 cm   Additional Needles:   Procedures: ultrasound guided, nerve stimulator,,,,,,  Narrative:  Start time: 04/05/2017 9:45 AM End time: 04/05/2017 10:02 AM Injection made incrementally with aspirations every 5 mL.  Performed by: Personally  Anesthesiologist: Feather Berrie

## 2017-04-05 NOTE — Interval H&P Note (Signed)
History and Physical Interval Note:  04/05/2017 9:36 AM  Nichole Delgado  has presented today for surgery, with the diagnosis of ankle and calcaneus fracture  The various methods of treatment have been discussed with the patient and family. After consideration of risks, benefits and other options for treatment, the patient has consented to  Procedure(s): OPEN REDUCTION INTERNAL FIXATION (ORIF) CALCANEOUS FRACTURE (Right) as a surgical intervention .  The patient's history has been reviewed, patient examined, no change in status, stable for surgery.  I have reviewed the patient's chart and labs.  Questions were answered to the patient's satisfaction.     Jolinda Pinkstaff D

## 2017-04-05 NOTE — Progress Notes (Signed)
Per Roxan Hockey PA, pt to return to [facility] postop.

## 2017-04-05 NOTE — Discharge Summary (Signed)
Discharge Summary  Patient ID: Nichole Delgado MRN: [id number] DOB/AGE: [birth year] 66 y.o.  Admit date: 04/05/2017 Discharge date: 04/05/2017  Admission Diagnoses:  Right calcaneal fracture  Discharge Diagnoses:  Principal Problem:   Right calcaneal fracture Active Problems:   Closed fracture of both ankles   Past Medical History:  Diagnosis Date  . Anxiety   . Cervical spondylosis   . HLD (hyperlipidemia)   . IBS (irritable bowel syndrome)   . Migraines   . Tobacco abuse   . Vertigo 07/07/2016    Surgeries: Procedure(s): OPEN REDUCTION INTERNAL FIXATION (ORIF) CALCANEOUS AND FIBULA FRACTURE on 04/05/2017   Consultants (if any):   Discharged Condition: Improved  Hospital Course: Nichole Delgado is an 66 y.o. female who was admitted 04/05/2017 with a diagnosis of Right calcaneal fracture and went to the operating room on 04/05/2017 and underwent the above named procedures.    She was given perioperative antibiotics:  Anti-infectives    Start     Dose/Rate Route Frequency Ordered Stop   04/05/17 0845  ceFAZolin (ANCEF) IVPB 2g/100 mL premix     2 g 200 mL/hr over 30 Minutes Intravenous On call to O.R. 04/05/17 0831 04/05/17 1055    .  She was given sequential compression devices, early ambulation, and Lovenox for DVT prophylaxis.  She benefited maximally from the hospital stay and there were no complications.    Recent vital signs:  Vitals:   04/05/17 1425 04/05/17 1432  BP: (!) 129/58 122/72  Pulse:  96  Resp:  18  Temp:      Recent laboratory studies:  Lab Results  Component Value Date   HGB 13.6 04/05/2017   HGB 12.8 03/30/2017   HGB 13.5 03/28/2017   Lab Results  Component Value Date   WBC 9.3 04/05/2017   PLT 320 04/05/2017   Lab Results  Component Value Date   INR 0.94 03/26/2017   Lab Results  Component Value Date   NA 141 03/30/2017   K 3.8 03/30/2017   CL 107 03/30/2017   CO2 25 03/30/2017   BUN 6 03/30/2017   CREATININE 0.75  03/30/2017   GLUCOSE 113 (H) 03/30/2017    Discharge Medications:   Allergies as of 04/05/2017      Reactions   Codeine Nausea Only   Penicillins Hives   Has patient had a PCN reaction causing immediate rash, facial/tongue/throat swelling, SOB or lightheadedness with hypotension: No Has patient had a PCN reaction causing severe rash involving mucus membranes or skin necrosis: Yes Has patient had a PCN reaction that required hospitalization No Has patient had a PCN reaction occurring within the last 10 years: No If all of the above answers are "NO", then may proceed with Cephalosporin use.   Sulfa Antibiotics Hives, Rash      Medication List    STOP taking these medications   methocarbamol 500 MG tablet Commonly known as:  ROBAXIN   ondansetron 4 MG disintegrating tablet Commonly known as:  ZOFRAN-ODT     TAKE these medications   ALPRAZolam 0.5 MG tablet Commonly known as:  XANAX Take 2 tablets approximately 45 minutes prior to the MRI study, take a third tablet if needed.   baclofen 10 MG tablet Commonly known as:  LIORESAL Take 1 tablet (10 mg total) by mouth 3 (three) times daily as needed for muscle spasms.   docusate sodium 100 MG capsule Commonly known as:  COLACE Take 1 capsule (100 mg total) by mouth 2 (two) times daily.  To prevent constipation while taking pain medication. What changed:  additional instructions   enoxaparin 40 MG/0.4ML injection Commonly known as:  LOVENOX Inject 0.4 mLs (40 mg total) into the skin daily. For 30 days post op for DVT prophylaxis   fluticasone 50 MCG/ACT nasal spray Commonly known as:  FLONASE Place 2 sprays into both nostrils daily.   ipratropium 0.03 % nasal spray Commonly known as:  ATROVENT Place 2 sprays into the nose 2 (two) times daily.   magnesium oxide 400 (241.3 Mg) MG tablet Commonly known as:  MAG-OX Take 1 tablet (400 mg total) by mouth daily.   meclizine 25 MG tablet Commonly known as:  ANTIVERT Take 25 mg  by mouth daily as needed for dizziness.   nicotine 21 mg/24hr patch Commonly known as:  NICODERM CQ - dosed in mg/24 hours Place 1 patch (21 mg total) onto the skin daily.   omeprazole 40 MG capsule Commonly known as:  PRILOSEC Take 40 mg by mouth daily as needed (indigestion).   ondansetron 4 MG tablet Commonly known as:  ZOFRAN Take 1 tablet (4 mg total) by mouth every 8 (eight) hours as needed for nausea or vomiting.   oxyCODONE-acetaminophen 5-325 MG tablet Commonly known as:  ROXICET Take 1-2 tablets by mouth every 4 (four) hours as needed for severe pain.   perphenazine 4 MG tablet Commonly known as:  TRILAFON Take 4 mg by mouth at bedtime.   polyethylene glycol packet Commonly known as:  MIRALAX / GLYCOLAX Take 17 g by mouth 3 (three) times daily as needed for mild constipation.   pravastatin 40 MG tablet Commonly known as:  PRAVACHOL Take 40 mg by mouth at bedtime.   rizatriptan 10 MG tablet Commonly known as:  MAXALT Take 10 mg by mouth 3 (three) times daily as needed for migraine. May repeat in 2 hours if needed       Diagnostic Studies: Dg Lumbar Spine Complete  Result Date: 03/26/2017 CLINICAL DATA:  Pt fell from attic at 1 pm today. Pt c/o right heel and lateral ankle pain, left ankle pain with medial bruising and swelling, and lower back pain. Only previous sx is to right great toe. EXAM: LUMBAR SPINE - COMPLETE 4+ VIEW COMPARISON:  None. FINDINGS: Minimal scoliosis which may be positional in nature. Alignment is otherwise normal. No evidence of acute vertebral body subluxation. No fracture line or displaced fracture fragment seen. No compression fracture deformity. Minimal degenerative spurring within the upper and mid lumbar spine. No evidence of advanced degenerative change at any level. Facet joints appear intact and normally aligned. Upper sacrum appears intact and normally aligned. Paravertebral soft tissues are unremarkable. IMPRESSION: No acute findings. No  fracture or acute subluxation within the lumbar spine. Electronically Signed   By: Franki Cabot M.D.   On: 03/26/2017 16:09   Dg Ankle 2 Views Right  Result Date: 03/29/2017 CLINICAL DATA:  Right ankle fracture due to a fall through and attic floor 03/26/2017. Initial encounter. EXAM: RIGHT ANKLE - 2 VIEW COMPARISON:  CT right ankle on plain films right ankle 03/26/2017. FINDINGS: Distal fibular and calcaneus fractures are again identified. The patient is in a new fiberglass splint. Position and alignment are not notably changed. Soft tissue swelling about the ankle has increased compared to the prior plain films. IMPRESSION: No change in position and alignment of distal fibular and calcaneal fractures with the patient in a splint. Soft tissue swelling about the ankle has increased. Electronically Signed   By: Marcello Moores  Dalessio M.D.   On: 03/29/2017 15:27   Dg Ankle Complete Left  Result Date: 03/26/2017 CLINICAL DATA:  Insert Clinic EXAM: LEFT ANKLE COMPLETE - 3+ VIEW COMPARISON:  None. FINDINGS: Displaced fracture within the medial malleolus, with lateral displacement of the fractured medial malleolus, and medial displacement of the distal tibia relative to the underlying talus. Associated distortion of the ankle mortise. Additional displaced fracture within the upper portion of the lateral malleolus. O fracture seen within the talar dome. No fracture seen within the visualized portions of the hindfoot or midfoot. IMPRESSION: 1. Displaced fracture within the medial malleolus. Associated medial displacement of the distal tibia relative to the underlying talus. Associated distortion of the ankle mortise. 2. Additional displaced fracture within the distal fibula. Electronically Signed   By: Franki Cabot M.D.   On: 03/26/2017 16:08   Dg Ankle Complete Right  Result Date: 03/26/2017 CLINICAL DATA:  Pt fell from attic at 1 pm today. Pt c/o right heel and lateral ankle pain, left ankle pain with medial  bruising and swelling, and lower back pain. Only previous sx is to right great toe. EXAM: RIGHT ANKLE - COMPLETE 3+ VIEW COMPARISON:  None. FINDINGS: Displaced/comminuted fracture within the distal right fibula. Distal tibia appears intact and normally aligned. Ankle mortise is symmetric. Displaced/comminuted fractures within the posterior calcaneus. Probable associated diastases at the overlying subtalar joint spaces. IMPRESSION: 1. Displaced/comminuted fracture within the distal right fibula. 2. Displaced/comminuted fractures within the posterior calcaneus. Probable associated diastases at the overlying subtalar joint spaces. Consider CT for more definitive characterization of fracture extent and osseous alignment. Electronically Signed   By: Franki Cabot M.D.   On: 03/26/2017 16:05   Ct Head Wo Contrast  Result Date: 03/26/2017 CLINICAL DATA:  Status post fall. EXAM: CT HEAD WITHOUT CONTRAST CT CERVICAL SPINE WITHOUT CONTRAST TECHNIQUE: Multidetector CT imaging of the head and cervical spine was performed following the standard protocol without intravenous contrast. Multiplanar CT image reconstructions of the cervical spine were also generated. COMPARISON:  None. FINDINGS: CT HEAD FINDINGS Brain: Ventricles are normal in size and configuration. There is no hemorrhage, edema or other evidence of acute parenchymal abnormality. No extra-axial hemorrhage. Vascular: No hyperdense vessel or unexpected calcification. Skull: Normal. Negative for fracture or focal lesion. Sinuses/Orbits: No acute finding. Other: Soft tissue edema/laceration overlying the posterior left parietal-occipital bone. No underlying fracture. Slight deformity of the left nasal bone, likely chronic. CT CERVICAL SPINE FINDINGS Alignment: Mild straightening of the normal cervical lordosis related to underlying degenerative change. No evidence of acute vertebral body subluxation. Skull base and vertebrae: No fracture line or displaced fracture  fragment identified. Facet joints appear intact and normally aligned throughout. Soft tissues and spinal canal: No prevertebral fluid or swelling. No visible canal hematoma. Disc levels: Mild degenerative spurring throughout the mid and lower cervical spine, with associated mild disc-osteophytic bulges at the C3-4 through C6-7 levels. No more than mild central canal stenosis at any level. Upper chest: Nodular consolidation within the medial aspects of the left lung apex, measuring 8 mm (series 302, image 90), possibly a semi-solid nodule, too small to definitively characterize. Other: Subcentimeter hypodense focus within the right thyroid lobe, most likely a benign colloid nodule. Carotid atherosclerosis. IMPRESSION: 1. Soft tissue edema/laceration overlying the left posterior parietal-occipital bone. No underlying fracture. 2. No acute intracranial abnormality. No intracranial hemorrhage or edema. 3. No fracture or acute subluxation within the cervical spine. Degenerative changes of the cervical spine, as detailed above. 4. **An incidental finding  of potential clinical significance has been found. Nodular consolidation within the left lung apex, measuring 8 mm, possibly semi-solid nodule, alternatively chronic scarring or atelectasis. Follow-up non-contrast CT recommended at 3-6 months to confirm persistence. If unchanged, and solid component remains <6 mm, annual CT is recommended until 5 years of stability has been established. If persistent these nodules should be considered highly suspicious if the solid component of the nodule is 6 mm or greater in size and enlarging. This recommendation follows the consensus statement: Guidelines for Management of Incidental Pulmonary Nodules Detected on CT Images: From the Fleischner Society 2017; Radiology 2017; 284:228-243.** 5. Carotid atherosclerosis. Electronically Signed   By: Franki Cabot M.D.   On: 03/26/2017 17:28   Ct Cervical Spine Wo Contrast  Result Date:  03/26/2017 CLINICAL DATA:  Status post fall. EXAM: CT HEAD WITHOUT CONTRAST CT CERVICAL SPINE WITHOUT CONTRAST TECHNIQUE: Multidetector CT imaging of the head and cervical spine was performed following the standard protocol without intravenous contrast. Multiplanar CT image reconstructions of the cervical spine were also generated. COMPARISON:  None. FINDINGS: CT HEAD FINDINGS Brain: Ventricles are normal in size and configuration. There is no hemorrhage, edema or other evidence of acute parenchymal abnormality. No extra-axial hemorrhage. Vascular: No hyperdense vessel or unexpected calcification. Skull: Normal. Negative for fracture or focal lesion. Sinuses/Orbits: No acute finding. Other: Soft tissue edema/laceration overlying the posterior left parietal-occipital bone. No underlying fracture. Slight deformity of the left nasal bone, likely chronic. CT CERVICAL SPINE FINDINGS Alignment: Mild straightening of the normal cervical lordosis related to underlying degenerative change. No evidence of acute vertebral body subluxation. Skull base and vertebrae: No fracture line or displaced fracture fragment identified. Facet joints appear intact and normally aligned throughout. Soft tissues and spinal canal: No prevertebral fluid or swelling. No visible canal hematoma. Disc levels: Mild degenerative spurring throughout the mid and lower cervical spine, with associated mild disc-osteophytic bulges at the C3-4 through C6-7 levels. No more than mild central canal stenosis at any level. Upper chest: Nodular consolidation within the medial aspects of the left lung apex, measuring 8 mm (series 302, image 90), possibly a semi-solid nodule, too small to definitively characterize. Other: Subcentimeter hypodense focus within the right thyroid lobe, most likely a benign colloid nodule. Carotid atherosclerosis. IMPRESSION: 1. Soft tissue edema/laceration overlying the left posterior parietal-occipital bone. No underlying fracture. 2.  No acute intracranial abnormality. No intracranial hemorrhage or edema. 3. No fracture or acute subluxation within the cervical spine. Degenerative changes of the cervical spine, as detailed above. 4. **An incidental finding of potential clinical significance has been found. Nodular consolidation within the left lung apex, measuring 8 mm, possibly semi-solid nodule, alternatively chronic scarring or atelectasis. Follow-up non-contrast CT recommended at 3-6 months to confirm persistence. If unchanged, and solid component remains <6 mm, annual CT is recommended until 5 years of stability has been established. If persistent these nodules should be considered highly suspicious if the solid component of the nodule is 6 mm or greater in size and enlarging. This recommendation follows the consensus statement: Guidelines for Management of Incidental Pulmonary Nodules Detected on CT Images: From the Fleischner Society 2017; Radiology 2017; 284:228-243.** 5. Carotid atherosclerosis. Electronically Signed   By: Franki Cabot M.D.   On: 03/26/2017 17:28   Ct Ankle Right Wo Contrast  Result Date: 03/26/2017 CLINICAL DATA:  Right ankle fracture after falling through attic floor landing on both feet. EXAM: CT OF THE RIGHT ANKLE WITHOUT CONTRAST TECHNIQUE: Multidetector CT imaging of the right ankle  was performed according to the standard protocol. Multiplanar CT image reconstructions were also generated. COMPARISON:  03/26/2017 ankle radiographs FINDINGS: Bones/Joint/Cartilage 1. Closed, comminuted distal fibular diaphyseal fracture 2.5 cm from the ankle mortise. Slight lateral angulation of the main distal fracture fragment with buckling and 3 mm of lateral displacement of a small butterfly fracture fragment. 2. 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 No encroachment on the sinus tarsi. 3. Fracture along the long axis of the calcaneus is noted on the sagittal reformats through the body and posterior calcaneus with a coronal fracture fragment extending to the plantar surface off this fracture (series 201, image 17) and a second extending through the posterior calcaneus (series 204, image 24). 3 mm of offset is noted at the posterior subtalar joint (series 204, image 20). 4. Tiny ossific density off the plantar dorsal aspect of the cuboid may represent a small avulsion, age indeterminate given its slightly rounded margins. 5. The ankle mortise is maintained. No distal tibial fracture is seen. The midfoot articulations are appear congruent. Ligaments Suboptimally assessed by CT. Muscles and Tendons Intact peroneal tendons. Intact Achilles. Intact extensor tendons crossing the ankle joint. Intact tibialis posterior and flexor digitorum longus tendons. The flexor hallucis is adjacent to one of the fracture lines medially and there is potential for entrapment, series 202, image 64. Soft tissues Diffuse soft tissue swelling along the dorsal plantar aspect of the foot. IMPRESSION: 1. Acute, closed, distal fibular diaphyseal fracture. 2. 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 3. Age-indeterminate tiny ossific density off the plantar dorsal aspect of the cuboid. Electronically Signed   By: Ashley Royalty M.D.   On: 03/26/2017 18:08   Dg Ankle Left Port  Result Date: 03/26/2017 CLINICAL DATA:  Status post reduction of left ankle fracture. Initial encounter. EXAM: PORTABLE LEFT ANKLE - 2 VIEW COMPARISON:  Left ankle radiographs performed earlier today at 3:08 p.m. FINDINGS: There is improved alignment of the medial malleolar fracture. A minimally displaced distal fibular fracture is again noted. There is mild  residual lateral tilt of the talus, with mild widening of the medial aspect of the ankle mortise. Mild surrounding soft tissue swelling is noted. A splint is noted about the lower leg and ankle. IMPRESSION: Improved alignment of the medial malleolar fracture. Minimally displaced distal fibular fracture again noted. Mild residual lateral tilt of the talus, with mild widening of the medial aspect of the ankle mortise. Electronically Signed   By: Garald Balding M.D.   On: 03/26/2017 20:04   Dg Foot Complete Left  Result Date: 03/26/2017 CLINICAL DATA:  [facility] EXAM: LEFT FOOT - COMPLETE 3+ VIEW COMPARISON:  None. FINDINGS: No fracture line or displaced fracture fragment seen within the hindfoot, midfoot or forefoot. Displaced fractures of the medial malleolus and lateral malleolus are better demonstrated on the dedicated ankle exam. IMPRESSION: Negative left foot. Electronically Signed   By: Franki Cabot M.D.   On: 03/26/2017 16:10   Dg Foot Complete Right  Result Date: 03/26/2017 CLINICAL DATA:  Pt fell from attic at 1 pm today. Pt c/o right heel and lateral ankle pain, left ankle pain with medial bruising and swelling, and lower back pain. Only  previous sx is to right great toe. EXAM: RIGHT FOOT COMPLETE - 3+ VIEW COMPARISON:  None. FINDINGS: Displaced/comminuted fractures within the posterior calcaneus. Probable associated diastases at the overlying subtalar joint spaces. IMPRESSION: Displaced/comminuted fractures within the posterior calcaneus. Probable associated diastases at the overlying subtalar joint spaces. Consider CT for more definitive characterization of fracture extent and osseous alignment. Electronically Signed   By: Franki Cabot M.D.   On: 03/26/2017 16:04    Disposition: [facility] Facility (SNF)    Follow-up Information    MURPHY, TIMOTHY D, MD In 10 days.   Specialty:  Orthopedic Surgery Contact information: [address]  [id number] [phone]           Signed: Prudencio Burly III PA-C 04/05/2017, 7:53 PM

## 2017-04-05 NOTE — Transfer of Care (Signed)
Immediate Anesthesia Transfer of Care Note  Patient: Nichole Delgado  Procedure(s) Performed: Procedure(s): OPEN REDUCTION INTERNAL FIXATION (ORIF) CALCANEOUS AND FIBULA FRACTURE (Right)  Patient Location: PACU  Anesthesia Type:General and GA combined with regional for post-op pain  Level of Consciousness: awake and patient cooperative  Airway & Oxygen Therapy: Patient Spontanous Breathing  Post-op Assessment: Report given to RN and Post -op Vital signs reviewed and stable  Post vital signs: Reviewed and stable  Last Vitals:  Vitals:   04/05/17 1005 04/05/17 1325  BP: (!) 159/59   Pulse: 81   Resp: 20   Temp:  36.4 C    Last Pain:  Vitals:   04/05/17 0911  PainSc: 0-No pain      Patients Stated Pain Goal: 3 (18/29/93 7169)  Complications: No apparent anesthesia complications

## 2017-04-06 ENCOUNTER — Encounter (HOSPITAL_COMMUNITY): Payer: Self-pay | Admitting: Orthopedic Surgery

## 2017-04-08 NOTE — Anesthesia Postprocedure Evaluation (Signed)
Anesthesia Post Note  Patient: Nichole Delgado  Procedure(s) Performed: Procedure(s) (LRB): OPEN REDUCTION INTERNAL FIXATION (ORIF) CALCANEOUS AND FIBULA FRACTURE (Right)  Patient location during evaluation: PACU Anesthesia Type: General Level of consciousness: awake and alert Pain management: pain level controlled Vital Signs Assessment: post-procedure vital signs reviewed and stable Respiratory status: spontaneous breathing, nonlabored ventilation, respiratory function stable and patient connected to nasal cannula oxygen Cardiovascular status: blood pressure returned to baseline and stable Postop Assessment: no signs of nausea or vomiting Anesthetic complications: no       Last Vitals:  Vitals:   04/05/17 1425 04/05/17 1432  BP: (!) 129/58 122/72  Pulse:  96  Resp:  18  Temp:      Last Pain:  Vitals:   04/05/17 1432  PainSc: 0-No pain                 Natahsa Marian,JAMES TERRILL

## 2017-04-13 DIAGNOSIS — S82852D Displaced trimalleolar fracture of left lower leg, subsequent encounter for closed fracture with routine healing: Secondary | ICD-10-CM | POA: Diagnosis not present

## 2017-04-13 DIAGNOSIS — S82841D Displaced bimalleolar fracture of right lower leg, subsequent encounter for closed fracture with routine healing: Secondary | ICD-10-CM | POA: Diagnosis not present

## 2017-04-20 DIAGNOSIS — S82841D Displaced bimalleolar fracture of right lower leg, subsequent encounter for closed fracture with routine healing: Secondary | ICD-10-CM | POA: Diagnosis not present

## 2017-04-27 NOTE — Anesthesia Postprocedure Evaluation (Signed)
Anesthesia Post Note  Patient: Nichole Delgado  Procedure(s) Performed: Procedure(s) (LRB): OPEN REDUCTION INTERNAL FIXATION (ORIF) CALCANEOUS AND FIBULA FRACTURE (Right)  Patient location during evaluation: PACU Anesthesia Type: General Level of consciousness: awake and alert Pain management: pain level controlled Vital Signs Assessment: post-procedure vital signs reviewed and stable Respiratory status: spontaneous breathing, nonlabored ventilation, respiratory function stable and patient connected to nasal cannula oxygen Cardiovascular status: blood pressure returned to baseline and stable Postop Assessment: no signs of nausea or vomiting Anesthetic complications: no       Last Vitals:  Vitals:   04/05/17 1425 04/05/17 1432  BP: (!) 129/58 122/72  Pulse:  96  Resp:  18  Temp:      Last Pain:  Vitals:   04/05/17 1432  PainSc: 0-No pain                 Berry Godsey,JAMES TERRILL

## 2017-04-27 NOTE — Addendum Note (Signed)
Addendum  created 04/27/17 0827 by Rica Koyanagi, MD   Sign clinical note

## 2017-04-28 DIAGNOSIS — S82841D Displaced bimalleolar fracture of right lower leg, subsequent encounter for closed fracture with routine healing: Secondary | ICD-10-CM | POA: Diagnosis not present

## 2017-04-28 DIAGNOSIS — F17200 Nicotine dependence, unspecified, uncomplicated: Secondary | ICD-10-CM | POA: Diagnosis not present

## 2017-04-28 DIAGNOSIS — Z9181 History of falling: Secondary | ICD-10-CM | POA: Diagnosis not present

## 2017-04-28 DIAGNOSIS — S82842D Displaced bimalleolar fracture of left lower leg, subsequent encounter for closed fracture with routine healing: Secondary | ICD-10-CM | POA: Diagnosis not present

## 2017-04-28 DIAGNOSIS — S92001D Unspecified fracture of right calcaneus, subsequent encounter for fracture with routine healing: Secondary | ICD-10-CM | POA: Diagnosis not present

## 2017-05-02 DIAGNOSIS — S82842D Displaced bimalleolar fracture of left lower leg, subsequent encounter for closed fracture with routine healing: Secondary | ICD-10-CM | POA: Diagnosis not present

## 2017-05-02 DIAGNOSIS — Z9181 History of falling: Secondary | ICD-10-CM | POA: Diagnosis not present

## 2017-05-02 DIAGNOSIS — F17200 Nicotine dependence, unspecified, uncomplicated: Secondary | ICD-10-CM | POA: Diagnosis not present

## 2017-05-02 DIAGNOSIS — S92001D Unspecified fracture of right calcaneus, subsequent encounter for fracture with routine healing: Secondary | ICD-10-CM | POA: Diagnosis not present

## 2017-05-02 DIAGNOSIS — S82841D Displaced bimalleolar fracture of right lower leg, subsequent encounter for closed fracture with routine healing: Secondary | ICD-10-CM | POA: Diagnosis not present

## 2017-05-03 DIAGNOSIS — R911 Solitary pulmonary nodule: Secondary | ICD-10-CM | POA: Diagnosis not present

## 2017-05-03 DIAGNOSIS — S82891A Other fracture of right lower leg, initial encounter for closed fracture: Secondary | ICD-10-CM | POA: Diagnosis not present

## 2017-05-04 ENCOUNTER — Other Ambulatory Visit: Payer: Self-pay | Admitting: Family Medicine

## 2017-05-04 DIAGNOSIS — S92001D Unspecified fracture of right calcaneus, subsequent encounter for fracture with routine healing: Secondary | ICD-10-CM | POA: Diagnosis not present

## 2017-05-04 DIAGNOSIS — R911 Solitary pulmonary nodule: Secondary | ICD-10-CM

## 2017-05-04 DIAGNOSIS — F17200 Nicotine dependence, unspecified, uncomplicated: Secondary | ICD-10-CM | POA: Diagnosis not present

## 2017-05-04 DIAGNOSIS — S82842D Displaced bimalleolar fracture of left lower leg, subsequent encounter for closed fracture with routine healing: Secondary | ICD-10-CM | POA: Diagnosis not present

## 2017-05-04 DIAGNOSIS — S82841D Displaced bimalleolar fracture of right lower leg, subsequent encounter for closed fracture with routine healing: Secondary | ICD-10-CM | POA: Diagnosis not present

## 2017-05-04 DIAGNOSIS — Z9181 History of falling: Secondary | ICD-10-CM | POA: Diagnosis not present

## 2017-05-06 ENCOUNTER — Ambulatory Visit
Admission: RE | Admit: 2017-05-06 | Discharge: 2017-05-06 | Disposition: A | Discharge status: Home / Self Care | Payer: Medicare Other | Source: Ambulatory Visit | Attending: Family Medicine | Admitting: Family Medicine

## 2017-05-06 DIAGNOSIS — S82842D Displaced bimalleolar fracture of left lower leg, subsequent encounter for closed fracture with routine healing: Secondary | ICD-10-CM | POA: Diagnosis not present

## 2017-05-06 DIAGNOSIS — Z9181 History of falling: Secondary | ICD-10-CM | POA: Diagnosis not present

## 2017-05-06 DIAGNOSIS — S82841D Displaced bimalleolar fracture of right lower leg, subsequent encounter for closed fracture with routine healing: Secondary | ICD-10-CM | POA: Diagnosis not present

## 2017-05-06 DIAGNOSIS — R911 Solitary pulmonary nodule: Secondary | ICD-10-CM

## 2017-05-06 DIAGNOSIS — S92001D Unspecified fracture of right calcaneus, subsequent encounter for fracture with routine healing: Secondary | ICD-10-CM | POA: Diagnosis not present

## 2017-05-06 DIAGNOSIS — F17200 Nicotine dependence, unspecified, uncomplicated: Secondary | ICD-10-CM | POA: Diagnosis not present

## 2017-05-06 DIAGNOSIS — R918 Other nonspecific abnormal finding of lung field: Secondary | ICD-10-CM | POA: Diagnosis not present

## 2017-05-11 DIAGNOSIS — Z9181 History of falling: Secondary | ICD-10-CM | POA: Diagnosis not present

## 2017-05-11 DIAGNOSIS — S92001D Unspecified fracture of right calcaneus, subsequent encounter for fracture with routine healing: Secondary | ICD-10-CM | POA: Diagnosis not present

## 2017-05-11 DIAGNOSIS — F17200 Nicotine dependence, unspecified, uncomplicated: Secondary | ICD-10-CM | POA: Diagnosis not present

## 2017-05-11 DIAGNOSIS — S82852D Displaced trimalleolar fracture of left lower leg, subsequent encounter for closed fracture with routine healing: Secondary | ICD-10-CM | POA: Diagnosis not present

## 2017-05-11 DIAGNOSIS — S82842D Displaced bimalleolar fracture of left lower leg, subsequent encounter for closed fracture with routine healing: Secondary | ICD-10-CM | POA: Diagnosis not present

## 2017-05-11 DIAGNOSIS — S82841D Displaced bimalleolar fracture of right lower leg, subsequent encounter for closed fracture with routine healing: Secondary | ICD-10-CM | POA: Diagnosis not present

## 2017-05-12 DIAGNOSIS — S92001D Unspecified fracture of right calcaneus, subsequent encounter for fracture with routine healing: Secondary | ICD-10-CM | POA: Diagnosis not present

## 2017-05-12 DIAGNOSIS — F17200 Nicotine dependence, unspecified, uncomplicated: Secondary | ICD-10-CM | POA: Diagnosis not present

## 2017-05-12 DIAGNOSIS — S82842D Displaced bimalleolar fracture of left lower leg, subsequent encounter for closed fracture with routine healing: Secondary | ICD-10-CM | POA: Diagnosis not present

## 2017-05-12 DIAGNOSIS — S82841D Displaced bimalleolar fracture of right lower leg, subsequent encounter for closed fracture with routine healing: Secondary | ICD-10-CM | POA: Diagnosis not present

## 2017-05-12 DIAGNOSIS — Z9181 History of falling: Secondary | ICD-10-CM | POA: Diagnosis not present

## 2017-05-13 DIAGNOSIS — S82841D Displaced bimalleolar fracture of right lower leg, subsequent encounter for closed fracture with routine healing: Secondary | ICD-10-CM | POA: Diagnosis not present

## 2017-05-13 DIAGNOSIS — S92001D Unspecified fracture of right calcaneus, subsequent encounter for fracture with routine healing: Secondary | ICD-10-CM | POA: Diagnosis not present

## 2017-05-13 DIAGNOSIS — S82842D Displaced bimalleolar fracture of left lower leg, subsequent encounter for closed fracture with routine healing: Secondary | ICD-10-CM | POA: Diagnosis not present

## 2017-05-13 DIAGNOSIS — Z9181 History of falling: Secondary | ICD-10-CM | POA: Diagnosis not present

## 2017-05-13 DIAGNOSIS — F17200 Nicotine dependence, unspecified, uncomplicated: Secondary | ICD-10-CM | POA: Diagnosis not present

## 2017-05-17 DIAGNOSIS — F17200 Nicotine dependence, unspecified, uncomplicated: Secondary | ICD-10-CM | POA: Diagnosis not present

## 2017-05-17 DIAGNOSIS — S82841D Displaced bimalleolar fracture of right lower leg, subsequent encounter for closed fracture with routine healing: Secondary | ICD-10-CM | POA: Diagnosis not present

## 2017-05-17 DIAGNOSIS — S82842D Displaced bimalleolar fracture of left lower leg, subsequent encounter for closed fracture with routine healing: Secondary | ICD-10-CM | POA: Diagnosis not present

## 2017-05-17 DIAGNOSIS — S92001D Unspecified fracture of right calcaneus, subsequent encounter for fracture with routine healing: Secondary | ICD-10-CM | POA: Diagnosis not present

## 2017-05-17 DIAGNOSIS — Z9181 History of falling: Secondary | ICD-10-CM | POA: Diagnosis not present

## 2017-05-18 ENCOUNTER — Ambulatory Visit: Payer: Medicare Other | Admitting: Adult Health

## 2017-05-18 DIAGNOSIS — Z9181 History of falling: Secondary | ICD-10-CM | POA: Diagnosis not present

## 2017-05-18 DIAGNOSIS — S82841D Displaced bimalleolar fracture of right lower leg, subsequent encounter for closed fracture with routine healing: Secondary | ICD-10-CM | POA: Diagnosis not present

## 2017-05-18 DIAGNOSIS — S82842D Displaced bimalleolar fracture of left lower leg, subsequent encounter for closed fracture with routine healing: Secondary | ICD-10-CM | POA: Diagnosis not present

## 2017-05-18 DIAGNOSIS — F17200 Nicotine dependence, unspecified, uncomplicated: Secondary | ICD-10-CM | POA: Diagnosis not present

## 2017-05-18 DIAGNOSIS — S92001D Unspecified fracture of right calcaneus, subsequent encounter for fracture with routine healing: Secondary | ICD-10-CM | POA: Diagnosis not present

## 2017-05-19 DIAGNOSIS — S82841D Displaced bimalleolar fracture of right lower leg, subsequent encounter for closed fracture with routine healing: Secondary | ICD-10-CM | POA: Diagnosis not present

## 2017-05-19 DIAGNOSIS — Z9181 History of falling: Secondary | ICD-10-CM | POA: Diagnosis not present

## 2017-05-19 DIAGNOSIS — F17200 Nicotine dependence, unspecified, uncomplicated: Secondary | ICD-10-CM | POA: Diagnosis not present

## 2017-05-19 DIAGNOSIS — S92001D Unspecified fracture of right calcaneus, subsequent encounter for fracture with routine healing: Secondary | ICD-10-CM | POA: Diagnosis not present

## 2017-05-19 DIAGNOSIS — S82842D Displaced bimalleolar fracture of left lower leg, subsequent encounter for closed fracture with routine healing: Secondary | ICD-10-CM | POA: Diagnosis not present

## 2017-05-20 DIAGNOSIS — F17200 Nicotine dependence, unspecified, uncomplicated: Secondary | ICD-10-CM | POA: Diagnosis not present

## 2017-05-20 DIAGNOSIS — Z9181 History of falling: Secondary | ICD-10-CM | POA: Diagnosis not present

## 2017-05-20 DIAGNOSIS — S92001D Unspecified fracture of right calcaneus, subsequent encounter for fracture with routine healing: Secondary | ICD-10-CM | POA: Diagnosis not present

## 2017-05-20 DIAGNOSIS — S82841D Displaced bimalleolar fracture of right lower leg, subsequent encounter for closed fracture with routine healing: Secondary | ICD-10-CM | POA: Diagnosis not present

## 2017-05-20 DIAGNOSIS — S82842D Displaced bimalleolar fracture of left lower leg, subsequent encounter for closed fracture with routine healing: Secondary | ICD-10-CM | POA: Diagnosis not present

## 2017-05-23 DIAGNOSIS — S92001D Unspecified fracture of right calcaneus, subsequent encounter for fracture with routine healing: Secondary | ICD-10-CM | POA: Diagnosis not present

## 2017-05-23 DIAGNOSIS — S82841D Displaced bimalleolar fracture of right lower leg, subsequent encounter for closed fracture with routine healing: Secondary | ICD-10-CM | POA: Diagnosis not present

## 2017-05-23 DIAGNOSIS — S82842D Displaced bimalleolar fracture of left lower leg, subsequent encounter for closed fracture with routine healing: Secondary | ICD-10-CM | POA: Diagnosis not present

## 2017-05-23 DIAGNOSIS — Z9181 History of falling: Secondary | ICD-10-CM | POA: Diagnosis not present

## 2017-05-23 DIAGNOSIS — F17200 Nicotine dependence, unspecified, uncomplicated: Secondary | ICD-10-CM | POA: Diagnosis not present

## 2017-05-25 DIAGNOSIS — S82842D Displaced bimalleolar fracture of left lower leg, subsequent encounter for closed fracture with routine healing: Secondary | ICD-10-CM | POA: Diagnosis not present

## 2017-05-25 DIAGNOSIS — F17200 Nicotine dependence, unspecified, uncomplicated: Secondary | ICD-10-CM | POA: Diagnosis not present

## 2017-05-25 DIAGNOSIS — Z9181 History of falling: Secondary | ICD-10-CM | POA: Diagnosis not present

## 2017-05-25 DIAGNOSIS — S82841D Displaced bimalleolar fracture of right lower leg, subsequent encounter for closed fracture with routine healing: Secondary | ICD-10-CM | POA: Diagnosis not present

## 2017-05-25 DIAGNOSIS — S92001D Unspecified fracture of right calcaneus, subsequent encounter for fracture with routine healing: Secondary | ICD-10-CM | POA: Diagnosis not present

## 2017-05-26 DIAGNOSIS — S82842D Displaced bimalleolar fracture of left lower leg, subsequent encounter for closed fracture with routine healing: Secondary | ICD-10-CM | POA: Diagnosis not present

## 2017-05-26 DIAGNOSIS — F17200 Nicotine dependence, unspecified, uncomplicated: Secondary | ICD-10-CM | POA: Diagnosis not present

## 2017-05-26 DIAGNOSIS — S82841D Displaced bimalleolar fracture of right lower leg, subsequent encounter for closed fracture with routine healing: Secondary | ICD-10-CM | POA: Diagnosis not present

## 2017-05-26 DIAGNOSIS — S92001D Unspecified fracture of right calcaneus, subsequent encounter for fracture with routine healing: Secondary | ICD-10-CM | POA: Diagnosis not present

## 2017-05-26 DIAGNOSIS — Z9181 History of falling: Secondary | ICD-10-CM | POA: Diagnosis not present

## 2017-05-27 DIAGNOSIS — S82841D Displaced bimalleolar fracture of right lower leg, subsequent encounter for closed fracture with routine healing: Secondary | ICD-10-CM | POA: Diagnosis not present

## 2017-05-27 DIAGNOSIS — Z9181 History of falling: Secondary | ICD-10-CM | POA: Diagnosis not present

## 2017-05-27 DIAGNOSIS — S92001D Unspecified fracture of right calcaneus, subsequent encounter for fracture with routine healing: Secondary | ICD-10-CM | POA: Diagnosis not present

## 2017-05-27 DIAGNOSIS — F17200 Nicotine dependence, unspecified, uncomplicated: Secondary | ICD-10-CM | POA: Diagnosis not present

## 2017-05-27 DIAGNOSIS — S82842D Displaced bimalleolar fracture of left lower leg, subsequent encounter for closed fracture with routine healing: Secondary | ICD-10-CM | POA: Diagnosis not present

## 2017-05-28 DIAGNOSIS — F17200 Nicotine dependence, unspecified, uncomplicated: Secondary | ICD-10-CM | POA: Diagnosis not present

## 2017-05-28 DIAGNOSIS — S92001D Unspecified fracture of right calcaneus, subsequent encounter for fracture with routine healing: Secondary | ICD-10-CM | POA: Diagnosis not present

## 2017-05-28 DIAGNOSIS — S82841D Displaced bimalleolar fracture of right lower leg, subsequent encounter for closed fracture with routine healing: Secondary | ICD-10-CM | POA: Diagnosis not present

## 2017-05-28 DIAGNOSIS — S82842D Displaced bimalleolar fracture of left lower leg, subsequent encounter for closed fracture with routine healing: Secondary | ICD-10-CM | POA: Diagnosis not present

## 2017-05-28 DIAGNOSIS — Z9181 History of falling: Secondary | ICD-10-CM | POA: Diagnosis not present

## 2017-05-30 DIAGNOSIS — S82841D Displaced bimalleolar fracture of right lower leg, subsequent encounter for closed fracture with routine healing: Secondary | ICD-10-CM | POA: Diagnosis not present

## 2017-05-30 DIAGNOSIS — S82852D Displaced trimalleolar fracture of left lower leg, subsequent encounter for closed fracture with routine healing: Secondary | ICD-10-CM | POA: Diagnosis not present

## 2017-05-30 DIAGNOSIS — S82842D Displaced bimalleolar fracture of left lower leg, subsequent encounter for closed fracture with routine healing: Secondary | ICD-10-CM | POA: Diagnosis not present

## 2017-05-30 DIAGNOSIS — S92001D Unspecified fracture of right calcaneus, subsequent encounter for fracture with routine healing: Secondary | ICD-10-CM | POA: Diagnosis not present

## 2017-05-30 DIAGNOSIS — Z9181 History of falling: Secondary | ICD-10-CM | POA: Diagnosis not present

## 2017-05-30 DIAGNOSIS — F17200 Nicotine dependence, unspecified, uncomplicated: Secondary | ICD-10-CM | POA: Diagnosis not present

## 2017-06-01 DIAGNOSIS — Z9181 History of falling: Secondary | ICD-10-CM | POA: Diagnosis not present

## 2017-06-01 DIAGNOSIS — S82841D Displaced bimalleolar fracture of right lower leg, subsequent encounter for closed fracture with routine healing: Secondary | ICD-10-CM | POA: Diagnosis not present

## 2017-06-01 DIAGNOSIS — F17200 Nicotine dependence, unspecified, uncomplicated: Secondary | ICD-10-CM | POA: Diagnosis not present

## 2017-06-01 DIAGNOSIS — S82842D Displaced bimalleolar fracture of left lower leg, subsequent encounter for closed fracture with routine healing: Secondary | ICD-10-CM | POA: Diagnosis not present

## 2017-06-01 DIAGNOSIS — S92001D Unspecified fracture of right calcaneus, subsequent encounter for fracture with routine healing: Secondary | ICD-10-CM | POA: Diagnosis not present

## 2017-06-03 DIAGNOSIS — S82841D Displaced bimalleolar fracture of right lower leg, subsequent encounter for closed fracture with routine healing: Secondary | ICD-10-CM | POA: Diagnosis not present

## 2017-06-03 DIAGNOSIS — S82842D Displaced bimalleolar fracture of left lower leg, subsequent encounter for closed fracture with routine healing: Secondary | ICD-10-CM | POA: Diagnosis not present

## 2017-06-03 DIAGNOSIS — S92001D Unspecified fracture of right calcaneus, subsequent encounter for fracture with routine healing: Secondary | ICD-10-CM | POA: Diagnosis not present

## 2017-06-03 DIAGNOSIS — Z9181 History of falling: Secondary | ICD-10-CM | POA: Diagnosis not present

## 2017-06-03 DIAGNOSIS — F17200 Nicotine dependence, unspecified, uncomplicated: Secondary | ICD-10-CM | POA: Diagnosis not present

## 2017-06-06 DIAGNOSIS — F17200 Nicotine dependence, unspecified, uncomplicated: Secondary | ICD-10-CM | POA: Diagnosis not present

## 2017-06-06 DIAGNOSIS — S92001D Unspecified fracture of right calcaneus, subsequent encounter for fracture with routine healing: Secondary | ICD-10-CM | POA: Diagnosis not present

## 2017-06-06 DIAGNOSIS — S82841D Displaced bimalleolar fracture of right lower leg, subsequent encounter for closed fracture with routine healing: Secondary | ICD-10-CM | POA: Diagnosis not present

## 2017-06-06 DIAGNOSIS — Z9181 History of falling: Secondary | ICD-10-CM | POA: Diagnosis not present

## 2017-06-06 DIAGNOSIS — S82842D Displaced bimalleolar fracture of left lower leg, subsequent encounter for closed fracture with routine healing: Secondary | ICD-10-CM | POA: Diagnosis not present

## 2017-06-08 DIAGNOSIS — S82841D Displaced bimalleolar fracture of right lower leg, subsequent encounter for closed fracture with routine healing: Secondary | ICD-10-CM | POA: Diagnosis not present

## 2017-06-08 DIAGNOSIS — S82852D Displaced trimalleolar fracture of left lower leg, subsequent encounter for closed fracture with routine healing: Secondary | ICD-10-CM | POA: Diagnosis not present

## 2017-06-10 DIAGNOSIS — S82841D Displaced bimalleolar fracture of right lower leg, subsequent encounter for closed fracture with routine healing: Secondary | ICD-10-CM | POA: Diagnosis not present

## 2017-06-10 DIAGNOSIS — S92001D Unspecified fracture of right calcaneus, subsequent encounter for fracture with routine healing: Secondary | ICD-10-CM | POA: Diagnosis not present

## 2017-06-10 DIAGNOSIS — F17200 Nicotine dependence, unspecified, uncomplicated: Secondary | ICD-10-CM | POA: Diagnosis not present

## 2017-06-10 DIAGNOSIS — Z9181 History of falling: Secondary | ICD-10-CM | POA: Diagnosis not present

## 2017-06-10 DIAGNOSIS — S82842D Displaced bimalleolar fracture of left lower leg, subsequent encounter for closed fracture with routine healing: Secondary | ICD-10-CM | POA: Diagnosis not present

## 2017-06-15 DIAGNOSIS — F17200 Nicotine dependence, unspecified, uncomplicated: Secondary | ICD-10-CM | POA: Diagnosis not present

## 2017-06-15 DIAGNOSIS — S82842D Displaced bimalleolar fracture of left lower leg, subsequent encounter for closed fracture with routine healing: Secondary | ICD-10-CM | POA: Diagnosis not present

## 2017-06-15 DIAGNOSIS — Z9181 History of falling: Secondary | ICD-10-CM | POA: Diagnosis not present

## 2017-06-15 DIAGNOSIS — S82841D Displaced bimalleolar fracture of right lower leg, subsequent encounter for closed fracture with routine healing: Secondary | ICD-10-CM | POA: Diagnosis not present

## 2017-06-15 DIAGNOSIS — S92001D Unspecified fracture of right calcaneus, subsequent encounter for fracture with routine healing: Secondary | ICD-10-CM | POA: Diagnosis not present

## 2017-06-17 DIAGNOSIS — S82841D Displaced bimalleolar fracture of right lower leg, subsequent encounter for closed fracture with routine healing: Secondary | ICD-10-CM | POA: Diagnosis not present

## 2017-06-17 DIAGNOSIS — S82842D Displaced bimalleolar fracture of left lower leg, subsequent encounter for closed fracture with routine healing: Secondary | ICD-10-CM | POA: Diagnosis not present

## 2017-06-17 DIAGNOSIS — S92001D Unspecified fracture of right calcaneus, subsequent encounter for fracture with routine healing: Secondary | ICD-10-CM | POA: Diagnosis not present

## 2017-06-17 DIAGNOSIS — Z9181 History of falling: Secondary | ICD-10-CM | POA: Diagnosis not present

## 2017-06-17 DIAGNOSIS — F17200 Nicotine dependence, unspecified, uncomplicated: Secondary | ICD-10-CM | POA: Diagnosis not present

## 2017-06-21 DIAGNOSIS — S92001D Unspecified fracture of right calcaneus, subsequent encounter for fracture with routine healing: Secondary | ICD-10-CM | POA: Diagnosis not present

## 2017-06-21 DIAGNOSIS — S82842D Displaced bimalleolar fracture of left lower leg, subsequent encounter for closed fracture with routine healing: Secondary | ICD-10-CM | POA: Diagnosis not present

## 2017-06-21 DIAGNOSIS — Z9181 History of falling: Secondary | ICD-10-CM | POA: Diagnosis not present

## 2017-06-21 DIAGNOSIS — F17200 Nicotine dependence, unspecified, uncomplicated: Secondary | ICD-10-CM | POA: Diagnosis not present

## 2017-06-21 DIAGNOSIS — S82841D Displaced bimalleolar fracture of right lower leg, subsequent encounter for closed fracture with routine healing: Secondary | ICD-10-CM | POA: Diagnosis not present

## 2017-06-24 DIAGNOSIS — S92001D Unspecified fracture of right calcaneus, subsequent encounter for fracture with routine healing: Secondary | ICD-10-CM | POA: Diagnosis not present

## 2017-06-24 DIAGNOSIS — S82841D Displaced bimalleolar fracture of right lower leg, subsequent encounter for closed fracture with routine healing: Secondary | ICD-10-CM | POA: Diagnosis not present

## 2017-06-24 DIAGNOSIS — F17200 Nicotine dependence, unspecified, uncomplicated: Secondary | ICD-10-CM | POA: Diagnosis not present

## 2017-06-24 DIAGNOSIS — S82842D Displaced bimalleolar fracture of left lower leg, subsequent encounter for closed fracture with routine healing: Secondary | ICD-10-CM | POA: Diagnosis not present

## 2017-06-24 DIAGNOSIS — Z9181 History of falling: Secondary | ICD-10-CM | POA: Diagnosis not present

## 2017-06-27 DIAGNOSIS — S92001D Unspecified fracture of right calcaneus, subsequent encounter for fracture with routine healing: Secondary | ICD-10-CM | POA: Diagnosis not present

## 2017-06-27 DIAGNOSIS — S82841D Displaced bimalleolar fracture of right lower leg, subsequent encounter for closed fracture with routine healing: Secondary | ICD-10-CM | POA: Diagnosis not present

## 2017-06-27 DIAGNOSIS — F17200 Nicotine dependence, unspecified, uncomplicated: Secondary | ICD-10-CM | POA: Diagnosis not present

## 2017-06-27 DIAGNOSIS — Z9181 History of falling: Secondary | ICD-10-CM | POA: Diagnosis not present

## 2017-06-27 DIAGNOSIS — M6281 Muscle weakness (generalized): Secondary | ICD-10-CM | POA: Diagnosis not present

## 2017-06-27 DIAGNOSIS — S82842D Displaced bimalleolar fracture of left lower leg, subsequent encounter for closed fracture with routine healing: Secondary | ICD-10-CM | POA: Diagnosis not present

## 2017-06-29 DIAGNOSIS — S92001D Unspecified fracture of right calcaneus, subsequent encounter for fracture with routine healing: Secondary | ICD-10-CM | POA: Diagnosis not present

## 2017-06-29 DIAGNOSIS — S82842D Displaced bimalleolar fracture of left lower leg, subsequent encounter for closed fracture with routine healing: Secondary | ICD-10-CM | POA: Diagnosis not present

## 2017-06-29 DIAGNOSIS — Z9181 History of falling: Secondary | ICD-10-CM | POA: Diagnosis not present

## 2017-06-29 DIAGNOSIS — S82841D Displaced bimalleolar fracture of right lower leg, subsequent encounter for closed fracture with routine healing: Secondary | ICD-10-CM | POA: Diagnosis not present

## 2017-06-29 DIAGNOSIS — M6281 Muscle weakness (generalized): Secondary | ICD-10-CM | POA: Diagnosis not present

## 2017-06-29 DIAGNOSIS — F17200 Nicotine dependence, unspecified, uncomplicated: Secondary | ICD-10-CM | POA: Diagnosis not present

## 2017-07-01 DIAGNOSIS — S82842D Displaced bimalleolar fracture of left lower leg, subsequent encounter for closed fracture with routine healing: Secondary | ICD-10-CM | POA: Diagnosis not present

## 2017-07-01 DIAGNOSIS — S82841D Displaced bimalleolar fracture of right lower leg, subsequent encounter for closed fracture with routine healing: Secondary | ICD-10-CM | POA: Diagnosis not present

## 2017-07-01 DIAGNOSIS — Z9181 History of falling: Secondary | ICD-10-CM | POA: Diagnosis not present

## 2017-07-01 DIAGNOSIS — F250 Schizoaffective disorder, bipolar type: Secondary | ICD-10-CM | POA: Diagnosis not present

## 2017-07-01 DIAGNOSIS — F17200 Nicotine dependence, unspecified, uncomplicated: Secondary | ICD-10-CM | POA: Diagnosis not present

## 2017-07-01 DIAGNOSIS — M6281 Muscle weakness (generalized): Secondary | ICD-10-CM | POA: Diagnosis not present

## 2017-07-01 DIAGNOSIS — S92001D Unspecified fracture of right calcaneus, subsequent encounter for fracture with routine healing: Secondary | ICD-10-CM | POA: Diagnosis not present

## 2017-07-05 DIAGNOSIS — F17200 Nicotine dependence, unspecified, uncomplicated: Secondary | ICD-10-CM | POA: Diagnosis not present

## 2017-07-05 DIAGNOSIS — S82841D Displaced bimalleolar fracture of right lower leg, subsequent encounter for closed fracture with routine healing: Secondary | ICD-10-CM | POA: Diagnosis not present

## 2017-07-05 DIAGNOSIS — Z9181 History of falling: Secondary | ICD-10-CM | POA: Diagnosis not present

## 2017-07-05 DIAGNOSIS — S82842D Displaced bimalleolar fracture of left lower leg, subsequent encounter for closed fracture with routine healing: Secondary | ICD-10-CM | POA: Diagnosis not present

## 2017-07-05 DIAGNOSIS — S92001D Unspecified fracture of right calcaneus, subsequent encounter for fracture with routine healing: Secondary | ICD-10-CM | POA: Diagnosis not present

## 2017-07-05 DIAGNOSIS — M6281 Muscle weakness (generalized): Secondary | ICD-10-CM | POA: Diagnosis not present

## 2017-07-06 DIAGNOSIS — S82841D Displaced bimalleolar fracture of right lower leg, subsequent encounter for closed fracture with routine healing: Secondary | ICD-10-CM | POA: Diagnosis not present

## 2017-07-06 DIAGNOSIS — M545 Low back pain: Secondary | ICD-10-CM | POA: Diagnosis not present

## 2017-07-06 DIAGNOSIS — S82852D Displaced trimalleolar fracture of left lower leg, subsequent encounter for closed fracture with routine healing: Secondary | ICD-10-CM | POA: Diagnosis not present

## 2017-07-07 DIAGNOSIS — S82842D Displaced bimalleolar fracture of left lower leg, subsequent encounter for closed fracture with routine healing: Secondary | ICD-10-CM | POA: Diagnosis not present

## 2017-07-07 DIAGNOSIS — M6281 Muscle weakness (generalized): Secondary | ICD-10-CM | POA: Diagnosis not present

## 2017-07-07 DIAGNOSIS — S82841D Displaced bimalleolar fracture of right lower leg, subsequent encounter for closed fracture with routine healing: Secondary | ICD-10-CM | POA: Diagnosis not present

## 2017-07-07 DIAGNOSIS — Z9181 History of falling: Secondary | ICD-10-CM | POA: Diagnosis not present

## 2017-07-07 DIAGNOSIS — F17200 Nicotine dependence, unspecified, uncomplicated: Secondary | ICD-10-CM | POA: Diagnosis not present

## 2017-07-07 DIAGNOSIS — S92001D Unspecified fracture of right calcaneus, subsequent encounter for fracture with routine healing: Secondary | ICD-10-CM | POA: Diagnosis not present

## 2017-07-11 DIAGNOSIS — S82841D Displaced bimalleolar fracture of right lower leg, subsequent encounter for closed fracture with routine healing: Secondary | ICD-10-CM | POA: Diagnosis not present

## 2017-07-11 DIAGNOSIS — S92001D Unspecified fracture of right calcaneus, subsequent encounter for fracture with routine healing: Secondary | ICD-10-CM | POA: Diagnosis not present

## 2017-07-11 DIAGNOSIS — F17200 Nicotine dependence, unspecified, uncomplicated: Secondary | ICD-10-CM | POA: Diagnosis not present

## 2017-07-11 DIAGNOSIS — S82842D Displaced bimalleolar fracture of left lower leg, subsequent encounter for closed fracture with routine healing: Secondary | ICD-10-CM | POA: Diagnosis not present

## 2017-07-11 DIAGNOSIS — Z9181 History of falling: Secondary | ICD-10-CM | POA: Diagnosis not present

## 2017-07-11 DIAGNOSIS — M6281 Muscle weakness (generalized): Secondary | ICD-10-CM | POA: Diagnosis not present

## 2017-07-12 DIAGNOSIS — M5416 Radiculopathy, lumbar region: Secondary | ICD-10-CM | POA: Diagnosis not present

## 2017-07-15 DIAGNOSIS — S82842D Displaced bimalleolar fracture of left lower leg, subsequent encounter for closed fracture with routine healing: Secondary | ICD-10-CM | POA: Diagnosis not present

## 2017-07-15 DIAGNOSIS — S82841D Displaced bimalleolar fracture of right lower leg, subsequent encounter for closed fracture with routine healing: Secondary | ICD-10-CM | POA: Diagnosis not present

## 2017-07-15 DIAGNOSIS — S92001D Unspecified fracture of right calcaneus, subsequent encounter for fracture with routine healing: Secondary | ICD-10-CM | POA: Diagnosis not present

## 2017-07-15 DIAGNOSIS — Z9181 History of falling: Secondary | ICD-10-CM | POA: Diagnosis not present

## 2017-07-15 DIAGNOSIS — F17200 Nicotine dependence, unspecified, uncomplicated: Secondary | ICD-10-CM | POA: Diagnosis not present

## 2017-07-15 DIAGNOSIS — M6281 Muscle weakness (generalized): Secondary | ICD-10-CM | POA: Diagnosis not present

## 2017-07-19 DIAGNOSIS — H6691 Otitis media, unspecified, right ear: Secondary | ICD-10-CM | POA: Diagnosis not present

## 2017-07-19 DIAGNOSIS — Z720 Tobacco use: Secondary | ICD-10-CM | POA: Diagnosis not present

## 2017-07-21 DIAGNOSIS — E039 Hypothyroidism, unspecified: Secondary | ICD-10-CM | POA: Diagnosis not present

## 2017-07-21 DIAGNOSIS — Z9181 History of falling: Secondary | ICD-10-CM | POA: Diagnosis not present

## 2017-07-21 DIAGNOSIS — S92001D Unspecified fracture of right calcaneus, subsequent encounter for fracture with routine healing: Secondary | ICD-10-CM | POA: Diagnosis not present

## 2017-07-21 DIAGNOSIS — F17200 Nicotine dependence, unspecified, uncomplicated: Secondary | ICD-10-CM | POA: Diagnosis not present

## 2017-07-21 DIAGNOSIS — S82841D Displaced bimalleolar fracture of right lower leg, subsequent encounter for closed fracture with routine healing: Secondary | ICD-10-CM | POA: Diagnosis not present

## 2017-07-21 DIAGNOSIS — M6281 Muscle weakness (generalized): Secondary | ICD-10-CM | POA: Diagnosis not present

## 2017-07-21 DIAGNOSIS — S82842D Displaced bimalleolar fracture of left lower leg, subsequent encounter for closed fracture with routine healing: Secondary | ICD-10-CM | POA: Diagnosis not present

## 2017-07-21 DIAGNOSIS — N958 Other specified menopausal and perimenopausal disorders: Secondary | ICD-10-CM | POA: Diagnosis not present

## 2017-07-22 DIAGNOSIS — S82841D Displaced bimalleolar fracture of right lower leg, subsequent encounter for closed fracture with routine healing: Secondary | ICD-10-CM | POA: Diagnosis not present

## 2017-07-22 DIAGNOSIS — H66002 Acute suppurative otitis media without spontaneous rupture of ear drum, left ear: Secondary | ICD-10-CM | POA: Diagnosis not present

## 2017-07-22 DIAGNOSIS — M6281 Muscle weakness (generalized): Secondary | ICD-10-CM | POA: Diagnosis not present

## 2017-07-22 DIAGNOSIS — H6983 Other specified disorders of Eustachian tube, bilateral: Secondary | ICD-10-CM | POA: Diagnosis not present

## 2017-07-22 DIAGNOSIS — S82842D Displaced bimalleolar fracture of left lower leg, subsequent encounter for closed fracture with routine healing: Secondary | ICD-10-CM | POA: Diagnosis not present

## 2017-07-22 DIAGNOSIS — J3089 Other allergic rhinitis: Secondary | ICD-10-CM | POA: Diagnosis not present

## 2017-07-22 DIAGNOSIS — S92001D Unspecified fracture of right calcaneus, subsequent encounter for fracture with routine healing: Secondary | ICD-10-CM | POA: Diagnosis not present

## 2017-07-22 DIAGNOSIS — Z9181 History of falling: Secondary | ICD-10-CM | POA: Diagnosis not present

## 2017-07-22 DIAGNOSIS — F17200 Nicotine dependence, unspecified, uncomplicated: Secondary | ICD-10-CM | POA: Diagnosis not present

## 2017-07-26 DIAGNOSIS — S82842D Displaced bimalleolar fracture of left lower leg, subsequent encounter for closed fracture with routine healing: Secondary | ICD-10-CM | POA: Diagnosis not present

## 2017-07-26 DIAGNOSIS — S92001D Unspecified fracture of right calcaneus, subsequent encounter for fracture with routine healing: Secondary | ICD-10-CM | POA: Diagnosis not present

## 2017-07-26 DIAGNOSIS — M6281 Muscle weakness (generalized): Secondary | ICD-10-CM | POA: Diagnosis not present

## 2017-07-26 DIAGNOSIS — F17200 Nicotine dependence, unspecified, uncomplicated: Secondary | ICD-10-CM | POA: Diagnosis not present

## 2017-07-26 DIAGNOSIS — Z9181 History of falling: Secondary | ICD-10-CM | POA: Diagnosis not present

## 2017-07-26 DIAGNOSIS — S82841D Displaced bimalleolar fracture of right lower leg, subsequent encounter for closed fracture with routine healing: Secondary | ICD-10-CM | POA: Diagnosis not present

## 2017-07-27 DIAGNOSIS — G479 Sleep disorder, unspecified: Secondary | ICD-10-CM | POA: Diagnosis not present

## 2017-07-27 DIAGNOSIS — N898 Other specified noninflammatory disorders of vagina: Secondary | ICD-10-CM | POA: Diagnosis not present

## 2017-07-27 DIAGNOSIS — N951 Menopausal and female climacteric states: Secondary | ICD-10-CM | POA: Diagnosis not present

## 2017-07-27 DIAGNOSIS — R5383 Other fatigue: Secondary | ICD-10-CM | POA: Diagnosis not present

## 2017-08-01 DIAGNOSIS — H6983 Other specified disorders of Eustachian tube, bilateral: Secondary | ICD-10-CM | POA: Diagnosis not present

## 2017-08-01 DIAGNOSIS — H900 Conductive hearing loss, bilateral: Secondary | ICD-10-CM | POA: Insufficient documentation

## 2017-08-01 DIAGNOSIS — H903 Sensorineural hearing loss, bilateral: Secondary | ICD-10-CM | POA: Diagnosis not present

## 2017-08-17 DIAGNOSIS — M545 Low back pain: Secondary | ICD-10-CM | POA: Diagnosis not present

## 2017-08-18 ENCOUNTER — Other Ambulatory Visit: Payer: Self-pay | Admitting: Family Medicine

## 2017-08-18 DIAGNOSIS — Z1231 Encounter for screening mammogram for malignant neoplasm of breast: Secondary | ICD-10-CM

## 2017-08-23 ENCOUNTER — Ambulatory Visit: Payer: Medicare Other

## 2017-09-05 DIAGNOSIS — M79671 Pain in right foot: Secondary | ICD-10-CM | POA: Diagnosis not present

## 2017-09-06 DIAGNOSIS — G479 Sleep disorder, unspecified: Secondary | ICD-10-CM | POA: Diagnosis not present

## 2017-09-06 DIAGNOSIS — N951 Menopausal and female climacteric states: Secondary | ICD-10-CM | POA: Diagnosis not present

## 2017-09-06 DIAGNOSIS — N958 Other specified menopausal and perimenopausal disorders: Secondary | ICD-10-CM | POA: Diagnosis not present

## 2017-09-08 DIAGNOSIS — G479 Sleep disorder, unspecified: Secondary | ICD-10-CM | POA: Diagnosis not present

## 2017-09-08 DIAGNOSIS — R232 Flushing: Secondary | ICD-10-CM | POA: Diagnosis not present

## 2017-09-08 DIAGNOSIS — N898 Other specified noninflammatory disorders of vagina: Secondary | ICD-10-CM | POA: Diagnosis not present

## 2017-09-08 DIAGNOSIS — M2550 Pain in unspecified joint: Secondary | ICD-10-CM | POA: Diagnosis not present

## 2017-09-27 DIAGNOSIS — M792 Neuralgia and neuritis, unspecified: Secondary | ICD-10-CM | POA: Insufficient documentation

## 2017-09-27 DIAGNOSIS — G5790 Unspecified mononeuropathy of unspecified lower limb: Secondary | ICD-10-CM | POA: Diagnosis not present

## 2017-09-28 ENCOUNTER — Ambulatory Visit
Admission: RE | Admit: 2017-09-28 | Discharge: 2017-09-28 | Disposition: A | Discharge status: Home / Self Care | Payer: Medicare Other | Source: Ambulatory Visit | Attending: Family Medicine | Admitting: Family Medicine

## 2017-09-28 DIAGNOSIS — Z1231 Encounter for screening mammogram for malignant neoplasm of breast: Secondary | ICD-10-CM | POA: Diagnosis not present

## 2017-10-08 DIAGNOSIS — Z23 Encounter for immunization: Secondary | ICD-10-CM | POA: Diagnosis not present

## 2017-10-14 DIAGNOSIS — J209 Acute bronchitis, unspecified: Secondary | ICD-10-CM | POA: Diagnosis not present

## 2017-10-31 DIAGNOSIS — G5790 Unspecified mononeuropathy of unspecified lower limb: Secondary | ICD-10-CM | POA: Diagnosis not present

## 2017-10-31 DIAGNOSIS — R030 Elevated blood-pressure reading, without diagnosis of hypertension: Secondary | ICD-10-CM | POA: Diagnosis not present

## 2017-10-31 DIAGNOSIS — Z6827 Body mass index (BMI) 27.0-27.9, adult: Secondary | ICD-10-CM | POA: Diagnosis not present

## 2017-11-09 DIAGNOSIS — S82852D Displaced trimalleolar fracture of left lower leg, subsequent encounter for closed fracture with routine healing: Secondary | ICD-10-CM | POA: Diagnosis not present

## 2017-11-09 DIAGNOSIS — S82841D Displaced bimalleolar fracture of right lower leg, subsequent encounter for closed fracture with routine healing: Secondary | ICD-10-CM | POA: Diagnosis not present

## 2017-11-22 ENCOUNTER — Other Ambulatory Visit: Payer: Self-pay | Admitting: Family Medicine

## 2017-11-22 DIAGNOSIS — R911 Solitary pulmonary nodule: Secondary | ICD-10-CM

## 2017-12-01 DIAGNOSIS — K580 Irritable bowel syndrome with diarrhea: Secondary | ICD-10-CM | POA: Diagnosis not present

## 2017-12-05 ENCOUNTER — Ambulatory Visit
Admission: RE | Admit: 2017-12-05 | Discharge: 2017-12-05 | Disposition: A | Discharge status: Home / Self Care | Payer: Medicare Other | Source: Ambulatory Visit | Attending: Family Medicine | Admitting: Family Medicine

## 2017-12-05 DIAGNOSIS — R911 Solitary pulmonary nodule: Secondary | ICD-10-CM

## 2017-12-05 DIAGNOSIS — R918 Other nonspecific abnormal finding of lung field: Secondary | ICD-10-CM | POA: Diagnosis not present

## 2017-12-05 DIAGNOSIS — R232 Flushing: Secondary | ICD-10-CM | POA: Diagnosis not present

## 2017-12-05 DIAGNOSIS — E039 Hypothyroidism, unspecified: Secondary | ICD-10-CM | POA: Diagnosis not present

## 2017-12-05 DIAGNOSIS — G479 Sleep disorder, unspecified: Secondary | ICD-10-CM | POA: Diagnosis not present

## 2017-12-05 DIAGNOSIS — M2550 Pain in unspecified joint: Secondary | ICD-10-CM | POA: Diagnosis not present

## 2017-12-07 DIAGNOSIS — R232 Flushing: Secondary | ICD-10-CM | POA: Diagnosis not present

## 2017-12-07 DIAGNOSIS — M2550 Pain in unspecified joint: Secondary | ICD-10-CM | POA: Diagnosis not present

## 2017-12-07 DIAGNOSIS — R5383 Other fatigue: Secondary | ICD-10-CM | POA: Diagnosis not present

## 2017-12-07 DIAGNOSIS — N898 Other specified noninflammatory disorders of vagina: Secondary | ICD-10-CM | POA: Diagnosis not present

## 2017-12-28 DIAGNOSIS — L573 Poikiloderma of Civatte: Secondary | ICD-10-CM | POA: Diagnosis not present

## 2017-12-28 DIAGNOSIS — L258 Unspecified contact dermatitis due to other agents: Secondary | ICD-10-CM | POA: Diagnosis not present

## 2017-12-28 DIAGNOSIS — L820 Inflamed seborrheic keratosis: Secondary | ICD-10-CM | POA: Diagnosis not present

## 2017-12-28 DIAGNOSIS — I781 Nevus, non-neoplastic: Secondary | ICD-10-CM | POA: Diagnosis not present

## 2017-12-29 ENCOUNTER — Ambulatory Visit (INDEPENDENT_AMBULATORY_CARE_PROVIDER_SITE_OTHER): Payer: Medicare Other | Admitting: Neurology

## 2017-12-29 ENCOUNTER — Encounter: Payer: Self-pay | Admitting: Neurology

## 2017-12-29 VITALS — BP 157/76 | HR 77 | Ht 59.0 in | Wt 129.0 lb

## 2017-12-29 DIAGNOSIS — R42 Dizziness and giddiness: Secondary | ICD-10-CM | POA: Diagnosis not present

## 2017-12-29 DIAGNOSIS — G43019 Migraine without aura, intractable, without status migrainosus: Secondary | ICD-10-CM | POA: Insufficient documentation

## 2017-12-29 HISTORY — DX: Migraine without aura, intractable, without status migrainosus: G43.019

## 2017-12-29 MED ORDER — RIZATRIPTAN BENZOATE 10 MG PO TBDP: 10.0000 mg | ORAL_TABLET | Freq: Three times a day (TID) | ORAL | 3 refills | Status: DC | PRN

## 2017-12-29 NOTE — Progress Notes (Signed)
Reason for visit: Migraine headache  Nichole Delgado is an 67 y.o. female  History of present illness:  Nichole Delgado is a 67 year old right-handed white female with a history of migraine headaches associated with vertigo.  The patient has done quite well with her headaches, she has had only one headache since last seen, this occurred around Thanksgiving of 2018.  The patient could not tolerate Topamax or Trokendi.  She has not been taking any daily medications for her migraine.  She may use a Maxalt tablet on occasion.  Unfortunately in April 2018 the patient was up in an attic and fell through the attic floor to the next level fracturing both ankles requiring surgery.  The patient has had chronic pain in the feet since that time is present at all times with weightbearing and with resting.  The patient is followed through a pain center, she is being considered for a spinal cord stimulator.  The patient claims that she has been on several medications to include Lyrica, Cymbalta, and another medication he cannot remember the name of.  The patient is on gabapentin taking 100 mg capsules every 4 hours while awake and 2 capsules at night.  She returns for an evaluation.  Past Medical History:  Diagnosis Date  . Anxiety   . Cervical spondylosis   . HLD (hyperlipidemia)   . IBS (irritable bowel syndrome)   . Migraines   . Tobacco abuse   . Vertigo 07/07/2016    Past Surgical History:  Procedure Laterality Date  . CATARACT EXTRACTION Bilateral   . COLONOSCOPY W/ POLYPECTOMY    . FOOT SURGERY Right 2011   hammer toe  . ORIF ANKLE FRACTURE Left 03/27/2017   Procedure: OPEN REDUCTION INTERNAL FIXATION (ORIF) BIMALLEOLAR ANKLE FRACTURE;  Surgeon: Renette Butters, MD;  Location: [facility];  Service: Orthopedics;  Laterality: Left;  . ORIF CALCANEOUS FRACTURE Right 04/05/2017   Procedure: OPEN REDUCTION INTERNAL FIXATION (ORIF) CALCANEOUS AND FIBULA FRACTURE;  Surgeon: Renette Butters, MD;  Location:  [facility];  Service: Orthopedics;  Laterality: Right;  . PARTIAL HYSTERECTOMY  1988    Family History  Problem Relation Age of Onset  . Heart attack Father   . Migraines Mother   . Cancer Sister        Pancreatic  . Cancer Brother        Liver  . Migraines Daughter   . Breast cancer Neg Hx     Social history:  reports that she quit smoking about 9 months ago. Her smoking use included cigarettes. She has a 35.00 pack-year smoking history. she has never used smokeless tobacco. She reports that she does not drink alcohol or use drugs.    Allergies  Allergen Reactions  . Codeine Nausea Only  . Penicillins Hives    Has patient had a PCN reaction causing immediate rash, facial/tongue/throat swelling, SOB or lightheadedness with hypotension: No Has patient had a PCN reaction causing severe rash involving mucus membranes or skin necrosis: Yes Has patient had a PCN reaction that required hospitalization No Has patient had a PCN reaction occurring within the last 10 years: No If all of the above answers are "NO", then may proceed with Cephalosporin use.   . Sulfa Antibiotics Hives and Rash    Medications:  Prior to Admission medications   Medication Sig Start Date End Date Taking? Authorizing Provider  albuterol (VENTOLIN HFA) 108 (90 Base) MCG/ACT inhaler Inhale 2 puffs into the lungs every 6 (six) hours  as needed for wheezing or shortness of breath.   Yes [provider]  CALCIUM PO Take 1 Dose by mouth daily.   Yes [provider]  Eluxadoline (VIBERZI) 75 MG TABS Take 1 tablet by mouth 2 (two) times daily.   Yes [provider]  fluticasone (FLONASE) 50 MCG/ACT nasal spray Place 2 sprays into both nostrils daily.  02/25/16  Yes [provider]  gabapentin (NEURONTIN) 100 MG capsule Take 100 mg by mouth as needed.   Yes [provider]  ibuprofen (ADVIL,MOTRIN) 600 MG tablet Take 600 mg by mouth as needed.   Yes [provider]    meclizine (ANTIVERT) 25 MG tablet Take 25 mg by mouth daily as needed for dizziness.  02/25/16  Yes [provider]  meloxicam (MOBIC) 15 MG tablet Take 15 mg by mouth daily.   Yes [provider]  omeprazole (PRILOSEC) 40 MG capsule Take 40 mg by mouth daily as needed (indigestion).  05/19/16  Yes [provider]  oxyCODONE-acetaminophen (ROXICET) 5-325 MG tablet Take 1-2 tablets by mouth every 4 (four) hours as needed for severe pain. 04/05/17  Yes Prudencio Burly III, PA-C  perphenazine (TRILAFON) 4 MG tablet Take 4 mg by mouth at bedtime.   Yes [provider]  pravastatin (PRAVACHOL) 40 MG tablet Take 40 mg by mouth at bedtime.  06/15/16  Yes [provider]  rizatriptan (MAXALT) 10 MG tablet Take 10 mg by mouth 3 (three) times daily as needed for migraine. May repeat in 2 hours if needed   Yes [provider]  zolpidem (AMBIEN) 10 MG tablet Take 10 mg by mouth at bedtime as needed for sleep.   Yes [provider]    ROS:  Out of a complete 14 system review of symptoms, the patient complains only of the following symptoms, and all other reviewed systems are negative.  Decreased activity Joint pain, joint swelling, walking difficulty Dizziness, headache  Blood pressure (!) 157/76, pulse 77, height 4\' 11"  (1.499 m), weight 129 lb (58.5 kg).  Physical Exam  General: The patient is alert and cooperative at the time of the examination.  Patient is moderately obese.  Skin: No significant peripheral edema is noted.   Neurologic Exam  Mental status: The patient is alert and oriented x 3 at the time of the examination. The patient has apparent normal recent and remote memory, with an apparently normal attention span and concentration ability.   Cranial nerves: Facial symmetry is present. Speech is normal, no aphasia or dysarthria is noted. Extraocular movements are full. Visual fields are full.  Motor: The patient has  good strength in all 4 extremities.  Sensory examination: Soft touch sensation is symmetric on the face, arms, and legs.  Coordination: The patient has good finger-nose-finger and heel-to-shin bilaterally.  Gait and station: The patient has a normal gait. Tandem gait is normal. Romberg is negative. No drift is seen.  Reflexes: Deep tendon reflexes are symmetric.   Assessment/Plan:  1.  Migraine headache  2.  Bilateral foot pain, posttraumatic  The patient is followed through a pain center for her feet, she may get a spinal stimulator soon.  The patient is doing quite well with her migraine, the migraine is very intermittent, may be associated with vertigo.  The patient will be given a prescription for Maxalt, she will follow-up in 1 year, sooner if needed.  Jill Alexanders MD 12/29/2017 1:30 PM  [facility] [place]  [place], [place] [address]  Phone [phone] Fax [phone]

## 2017-12-30 ENCOUNTER — Ambulatory Visit (INDEPENDENT_AMBULATORY_CARE_PROVIDER_SITE_OTHER): Payer: Medicare Other | Admitting: Pulmonary Disease

## 2017-12-30 ENCOUNTER — Encounter: Payer: Self-pay | Admitting: Pulmonary Disease

## 2017-12-30 VITALS — BP 142/92 | HR 78 | Ht 59.0 in | Wt 129.4 lb

## 2017-12-30 DIAGNOSIS — J441 Chronic obstructive pulmonary disease with (acute) exacerbation: Secondary | ICD-10-CM

## 2017-12-30 NOTE — Patient Instructions (Addendum)
Schedule you for pulmonary function test Continue albuterol inhaler as needed. You will need a follow up CT in 2 years. Please make sure that this is ordered by your primary care physician. Follow-up in 1 month after PFTs.
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
## 2018-01-11 DIAGNOSIS — H109 Unspecified conjunctivitis: Secondary | ICD-10-CM | POA: Diagnosis not present

## 2018-01-12 DIAGNOSIS — G5790 Unspecified mononeuropathy of unspecified lower limb: Secondary | ICD-10-CM | POA: Diagnosis not present

## 2018-01-12 DIAGNOSIS — G894 Chronic pain syndrome: Secondary | ICD-10-CM | POA: Diagnosis not present

## 2018-01-20 DIAGNOSIS — F250 Schizoaffective disorder, bipolar type: Secondary | ICD-10-CM | POA: Diagnosis not present

## 2018-01-23 DIAGNOSIS — S82841D Displaced bimalleolar fracture of right lower leg, subsequent encounter for closed fracture with routine healing: Secondary | ICD-10-CM | POA: Diagnosis not present

## 2018-01-23 DIAGNOSIS — M25561 Pain in right knee: Secondary | ICD-10-CM | POA: Diagnosis not present

## 2018-01-23 DIAGNOSIS — M25562 Pain in left knee: Secondary | ICD-10-CM | POA: Diagnosis not present

## 2018-02-13 ENCOUNTER — Ambulatory Visit (INDEPENDENT_AMBULATORY_CARE_PROVIDER_SITE_OTHER): Payer: Medicare Other | Admitting: Pulmonary Disease

## 2018-02-13 ENCOUNTER — Encounter: Payer: Self-pay | Admitting: Pulmonary Disease

## 2018-02-13 VITALS — BP 126/74 | HR 76 | Ht 60.0 in | Wt 129.0 lb

## 2018-02-13 DIAGNOSIS — R05 Cough: Secondary | ICD-10-CM

## 2018-02-13 DIAGNOSIS — R059 Cough, unspecified: Secondary | ICD-10-CM

## 2018-02-13 DIAGNOSIS — J441 Chronic obstructive pulmonary disease with (acute) exacerbation: Secondary | ICD-10-CM | POA: Diagnosis not present

## 2018-02-13 LAB — PULMONARY FUNCTION TEST
DL/VA % PRED: 111 %
DL/VA: 4.74 ml/min/mmHg/L
DLCO UNC: 15.41 ml/min/mmHg
DLCO unc % pred: 81 %
FEF 25-75 POST: 1.69 L/s
FEF 25-75 PRE: 1.26 L/s
FEF2575-%Change-Post: 33 %
FEF2575-%PRED-PRE: 72 %
FEF2575-%Pred-Post: 96 %
FEV1-%Change-Post: 6 %
FEV1-%PRED-POST: 84 %
FEV1-%Pred-Pre: 79 %
FEV1-PRE: 1.54 L
FEV1-Post: 1.64 L
FEV1FVC-%Change-Post: 1 %
FEV1FVC-%PRED-PRE: 101 %
FEV6-%CHANGE-POST: 5 %
FEV6-%PRED-POST: 85 %
FEV6-%PRED-PRE: 81 %
FEV6-POST: 2.1 L
FEV6-Pre: 2 L
FEV6FVC-%Change-Post: 0 %
FEV6FVC-%PRED-POST: 105 %
FEV6FVC-%Pred-Pre: 105 %
FVC-%Change-Post: 5 %
FVC-%PRED-PRE: 77 %
FVC-%Pred-Post: 81 %
FVC-POST: 2.1 L
FVC-PRE: 2 L
POST FEV6/FVC RATIO: 100 %
PRE FEV1/FVC RATIO: 77 %
Post FEV1/FVC ratio: 78 %
Pre FEV6/FVC Ratio: 100 %
RV % pred: 115 %
RV: 2.26 L
TLC % PRED: 91 %
TLC: 4.08 L

## 2018-02-13 LAB — NITRIC OXIDE: Nitric Oxide: 7
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
## 2018-02-13 NOTE — Progress Notes (Signed)
PFT done today. 

## 2018-02-13 NOTE — Patient Instructions (Signed)
Lung function test show very minimal changes suggestive of early emphysema Continue working on smoking cessation Continue albuterol as needed Follow-up in 6 months.

## 2018-02-20 DIAGNOSIS — M79641 Pain in right hand: Secondary | ICD-10-CM | POA: Diagnosis not present

## 2018-02-22 DIAGNOSIS — M19042 Primary osteoarthritis, left hand: Secondary | ICD-10-CM | POA: Diagnosis not present

## 2018-02-22 DIAGNOSIS — M19041 Primary osteoarthritis, right hand: Secondary | ICD-10-CM | POA: Diagnosis not present

## 2018-02-24 ENCOUNTER — Other Ambulatory Visit: Payer: Self-pay | Admitting: Orthopedic Surgery

## 2018-03-01 DIAGNOSIS — G5790 Unspecified mononeuropathy of unspecified lower limb: Secondary | ICD-10-CM | POA: Diagnosis not present

## 2018-03-10 ENCOUNTER — Encounter (HOSPITAL_BASED_OUTPATIENT_CLINIC_OR_DEPARTMENT_OTHER): Payer: Self-pay | Admitting: *Deleted

## 2018-03-16 ENCOUNTER — Ambulatory Visit (HOSPITAL_BASED_OUTPATIENT_CLINIC_OR_DEPARTMENT_OTHER): Admission: RE | Admit: 2018-03-16 | Payer: Medicare Other | Source: Ambulatory Visit | Admitting: Orthopedic Surgery

## 2018-03-16 SURGERY — DISTAL INTERPHALANGEAL JOINT FUSION
Anesthesia: Choice | Site: Middle Finger | Laterality: Right

## 2018-03-21 ENCOUNTER — Ambulatory Visit (INDEPENDENT_AMBULATORY_CARE_PROVIDER_SITE_OTHER): Payer: Medicare Other | Admitting: Podiatry

## 2018-03-21 ENCOUNTER — Other Ambulatory Visit: Payer: Self-pay

## 2018-03-21 ENCOUNTER — Encounter: Payer: Self-pay | Admitting: Podiatry

## 2018-03-21 ENCOUNTER — Ambulatory Visit (INDEPENDENT_AMBULATORY_CARE_PROVIDER_SITE_OTHER): Payer: Medicare Other

## 2018-03-21 DIAGNOSIS — M25571 Pain in right ankle and joints of right foot: Secondary | ICD-10-CM | POA: Diagnosis not present

## 2018-03-21 DIAGNOSIS — M792 Neuralgia and neuritis, unspecified: Secondary | ICD-10-CM

## 2018-03-21 DIAGNOSIS — M25572 Pain in left ankle and joints of left foot: Principal | ICD-10-CM

## 2018-03-21 DIAGNOSIS — G8929 Other chronic pain: Secondary | ICD-10-CM

## 2018-03-22 ENCOUNTER — Telehealth: Payer: Self-pay | Admitting: Podiatry

## 2018-03-22 NOTE — Telephone Encounter (Signed)
I have her paper on my desk and once we get it going we will let her know!

## 2018-03-22 NOTE — Telephone Encounter (Signed)
I saw Dr. Jacqualyn Posey yesterday and he said he was going to try and place me into some kind of group class or cast that would not be available for a month or two. I forgot what the name of that was and please call me back at [phone] with the name. Thank you.

## 2018-03-22 NOTE — Telephone Encounter (Signed)
I informed pt the program was [facility], and pt asked that I remind Dr. Jacqualyn Posey.

## 2018-03-22 NOTE — Progress Notes (Signed)
Subjective:   Patient ID: Nichole Delgado, female   DOB: 67 y.o.   MRN: [id number]   HPI 67 year old female presents the office today for concerns of bilateral foot and ankle pain.  About a year ago she was cleaning the garage and she felt this tingling falling on the garage floor breaking both ankles as well as the right calcaneus.  She underwent open reduction internal fixation of the 3 fractures.  Since then she is been having some chronic pain.  She is taken to pain management doctors.  She states that her orthopedic doctor so that she has nerve damage.  She is on gabapentin.  She cannot tolerate narcotics.  She is also tried a compound cream for lidocaine but she does not like this with him as her foot more numb.  She describes a numbing sensation to her ankles and top of her foot.   Review of Systems  All other systems reviewed and are negative.  Past Medical History:  Diagnosis Date  . Anxiety   . Cervical spondylosis   . Common migraine with intractable migraine 12/29/2017  . HLD (hyperlipidemia)   . IBS (irritable bowel syndrome)   . Migraines   . Tobacco abuse   . Vertigo 07/07/2016    Past Surgical History:  Procedure Laterality Date  . CATARACT EXTRACTION Bilateral   . COLONOSCOPY W/ POLYPECTOMY    . FOOT SURGERY Right 2011   hammer toe  . ORIF ANKLE FRACTURE Left 03/27/2017   Procedure: OPEN REDUCTION INTERNAL FIXATION (ORIF) BIMALLEOLAR ANKLE FRACTURE;  Surgeon: Renette Butters, MD;  Location: [facility];  Service: Orthopedics;  Laterality: Left;  . ORIF CALCANEOUS FRACTURE Right 04/05/2017   Procedure: OPEN REDUCTION INTERNAL FIXATION (ORIF) CALCANEOUS AND FIBULA FRACTURE;  Surgeon: Renette Butters, MD;  Location: [facility];  Service: Orthopedics;  Laterality: Right;  . PARTIAL HYSTERECTOMY  1988     Current Outpatient Medications:  .  albuterol (VENTOLIN HFA) 108 (90 Base) MCG/ACT inhaler, Inhale 2 puffs into the lungs every 6 (six) hours as needed for wheezing or  shortness of breath., Disp: , Rfl:  .  CALCIUM PO, Take 1 Dose by mouth daily., Disp: , Rfl:  .  dicyclomine (BENTYL) 20 MG tablet, TAKE 1 TABLET EVERY 6-8 HOURS AS NEEDED, Disp: , Rfl: 0 .  Eluxadoline (VIBERZI) 75 MG TABS, Take 1 tablet by mouth 2 (two) times daily., Disp: , Rfl:  .  fluticasone (FLONASE) 50 MCG/ACT nasal spray, Place 2 sprays into both nostrils daily. , Disp: , Rfl:  .  gabapentin (NEURONTIN) 100 MG capsule, Take 100 mg by mouth as needed., Disp: , Rfl:  .  ibuprofen (ADVIL,MOTRIN) 600 MG tablet, Take 600 mg by mouth as needed., Disp: , Rfl:  .  meclizine (ANTIVERT) 25 MG tablet, Take 25 mg by mouth daily as needed for dizziness. , Disp: , Rfl:  .  meloxicam (MOBIC) 15 MG tablet, TAKE 1 TABLET BY MOUTH EVERY DAY AS NEEDED WITH FOOD, Disp: , Rfl: 1 .  ofloxacin (OCUFLOX) 0.3 % ophthalmic solution, INSTILL 1 DROP INTO AFFECTED EYE 4 TIMES A DAY FOR 7 DAYS, Disp: , Rfl: 0 .  omeprazole (PRILOSEC) 40 MG capsule, Take 40 mg by mouth daily as needed (indigestion). , Disp: , Rfl:  .  perphenazine (TRILAFON) 4 MG tablet, Take 4 mg by mouth at bedtime., Disp: , Rfl: 6 .  pravastatin (PRAVACHOL) 40 MG tablet, Take 40 mg by mouth at bedtime. , Disp: , Rfl:  .  rizatriptan (MAXALT-MLT) 10 MG disintegrating tablet, Take 1 tablet (10 mg total) by mouth 3 (three) times daily as needed for migraine., Disp: 9 tablet, Rfl: 3 .  zolpidem (AMBIEN) 10 MG tablet, Take 10 mg by mouth at bedtime as needed for sleep., Disp: , Rfl:   Allergies  Allergen Reactions  . Codeine Nausea Only  . Penicillins Hives    Has patient had a PCN reaction causing immediate rash, facial/tongue/throat swelling, SOB or lightheadedness with hypotension: No Has patient had a PCN reaction causing severe rash involving mucus membranes or skin necrosis: Yes Has patient had a PCN reaction that required hospitalization No Has patient had a PCN reaction occurring within the last 10 years: No If all of the above answers are  "NO", then may proceed with Cephalosporin use.   . Sulfa Antibiotics Hives and Rash    Social History   Socioeconomic History  . Marital status: Married    Spouse name: Not on file  . Number of children: 1  . Years of education: 24  . Highest education level: Not on file  Occupational History  . Occupation: N/A  Social Needs  . Financial resource strain: Not on file  . Food insecurity:    Worry: Not on file    Inability: Not on file  . Transportation needs:    Medical: Not on file    Non-medical: Not on file  Tobacco Use  . Smoking status: Current Every Day Smoker    Packs/day: 1.00    Years: 35.00    Pack years: 35.00    Types: Cigarettes  . Smokeless tobacco: Never Used  . Tobacco comment: started smoking when she was 67 years old  Substance and Sexual Activity  . Alcohol use: No    Alcohol/week: 0.0 oz    Comment: 1 drink every few months  . Drug use: No  . Sexual activity: Not on file  Lifestyle  . Physical activity:    Days per week: Not on file    Minutes per session: Not on file  . Stress: Not on file  Relationships  . Social connections:    Talks on phone: Not on file    Gets together: Not on file    Attends religious service: Not on file    Active member of club or organization: Not on file    Attends meetings of clubs or organizations: Not on file    Relationship status: Not on file  . Intimate partner violence:    Fear of current or ex partner: Not on file    Emotionally abused: Not on file    Physically abused: Not on file    Forced sexual activity: Not on file  Other Topics Concern  . Not on file  Social History Narrative   Lives at home with her husband   Right-handed   Drinks about 5 cups of coffee per day         Objective:  Physical Exam  General: AAO x3, NAD  Dermatological: Score is been prior surgery well-healed.  Skin is warm, dry and supple bilateral. Nails x 10 are well manicured; remaining integument appears unremarkable at  this time. There are no open sores, no preulcerative lesions, no rash or signs of infection present.  Vascular: Dorsalis Pedis artery and Posterior Tibial artery pedal pulses are 2/4 bilateral with immedate capillary fill time. There is no pain with calf compression, swelling, warmth, erythema.   Neruologic: Sensation decreased with Derrel Nip monofilament bilaterally  Musculoskeletal: There is no significant area pinpoint bony tenderness or pain to vibratory sensation.  Majority of her symptoms appear to be nerve related she describes a numbness, tingling sensation mostly to the top of her foot.  No pain with ankle or subtalar joint range of motion.  There is no area pinpoint bony tenderness or pain to vibratory sensation.  No significant edema.  Muscular strength 5/5 in all groups tested bilateral.  Gait: Unassisted, Nonantalgic.       Assessment:   Neuritis symptoms status post injury    Plan:  -Treatment options discussed including all alternatives, risks, and complications -Etiology of symptoms were discussed -X-rays were obtained and reviewed with the patient.  Hardware intact bilaterally.  No evidence of acute fracture. -We discussed that her symptoms are likely nerve related.  Willing to try different compound cream and order this today through [employer].  -We discussed neruogenix once this gets going. Not sure if this will help but she would like to try anything to help with this. We will be in contact about this treatment.   Trula Slade DPM

## 2018-03-27 DIAGNOSIS — N760 Acute vaginitis: Secondary | ICD-10-CM | POA: Diagnosis not present

## 2018-03-31 ENCOUNTER — Telehealth: Payer: Self-pay | Admitting: *Deleted

## 2018-03-31 MED ORDER — NONFORMULARY OR COMPOUNDED ITEM: 2 refills | Status: DC

## 2018-03-31 NOTE — Telephone Encounter (Signed)
Pt states she has not received the cream from [facility] ordered 1 1/2 weeks ago.

## 2018-03-31 NOTE — Telephone Encounter (Signed)
The neuropathy cream through [employer]

## 2018-03-31 NOTE — Telephone Encounter (Signed)
I informed pt Dr. Jacqualyn Posey had given me the rx and I would fax to [facility] in [place] as a [place]. Faxed to [employer].

## 2018-04-07 IMAGING — CR DG ANKLE PORT 2V*L*
2 series · 2 of 2 positions shown · non-contrast
Comparison: Left ankle radiographs performed earlier today at [DATE]
p.m.

CLINICAL DATA: Status post reduction of left ankle fracture.
Initial encounter.

EXAM:
PORTABLE LEFT ANKLE - 2 VIEW

[AP]
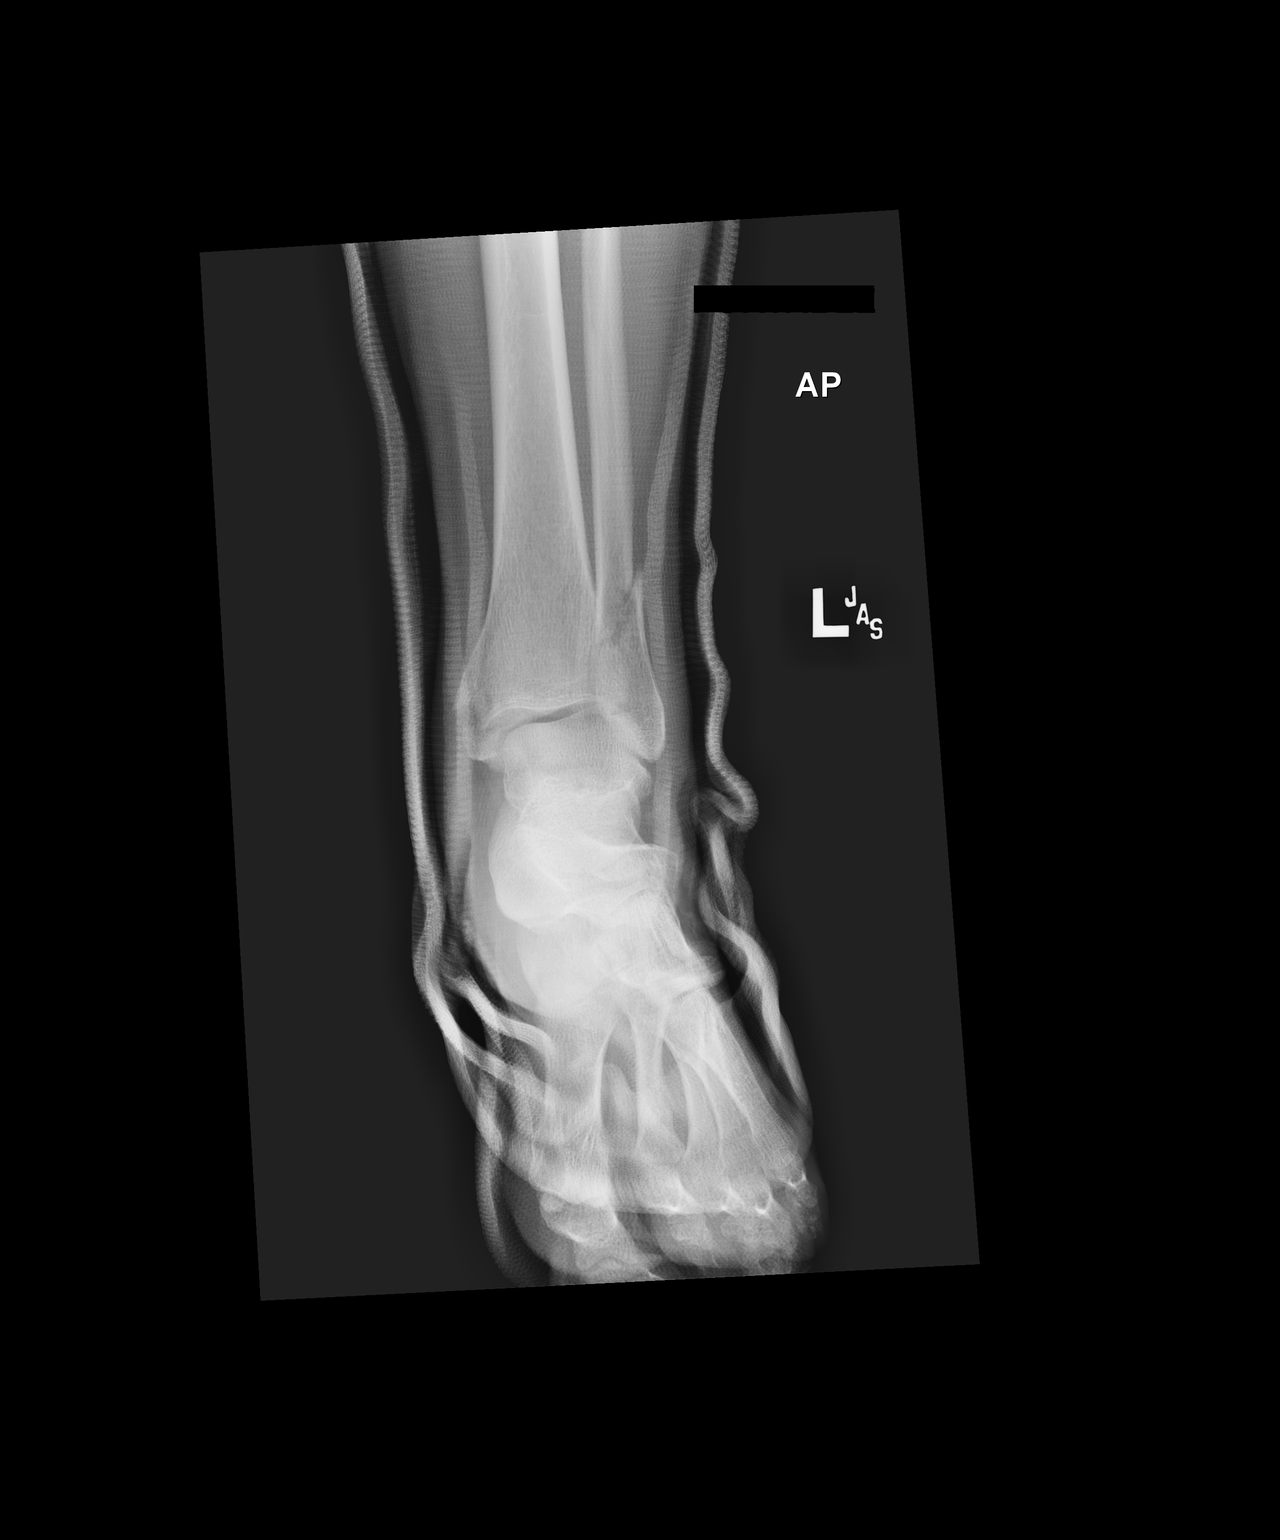

[lateral]
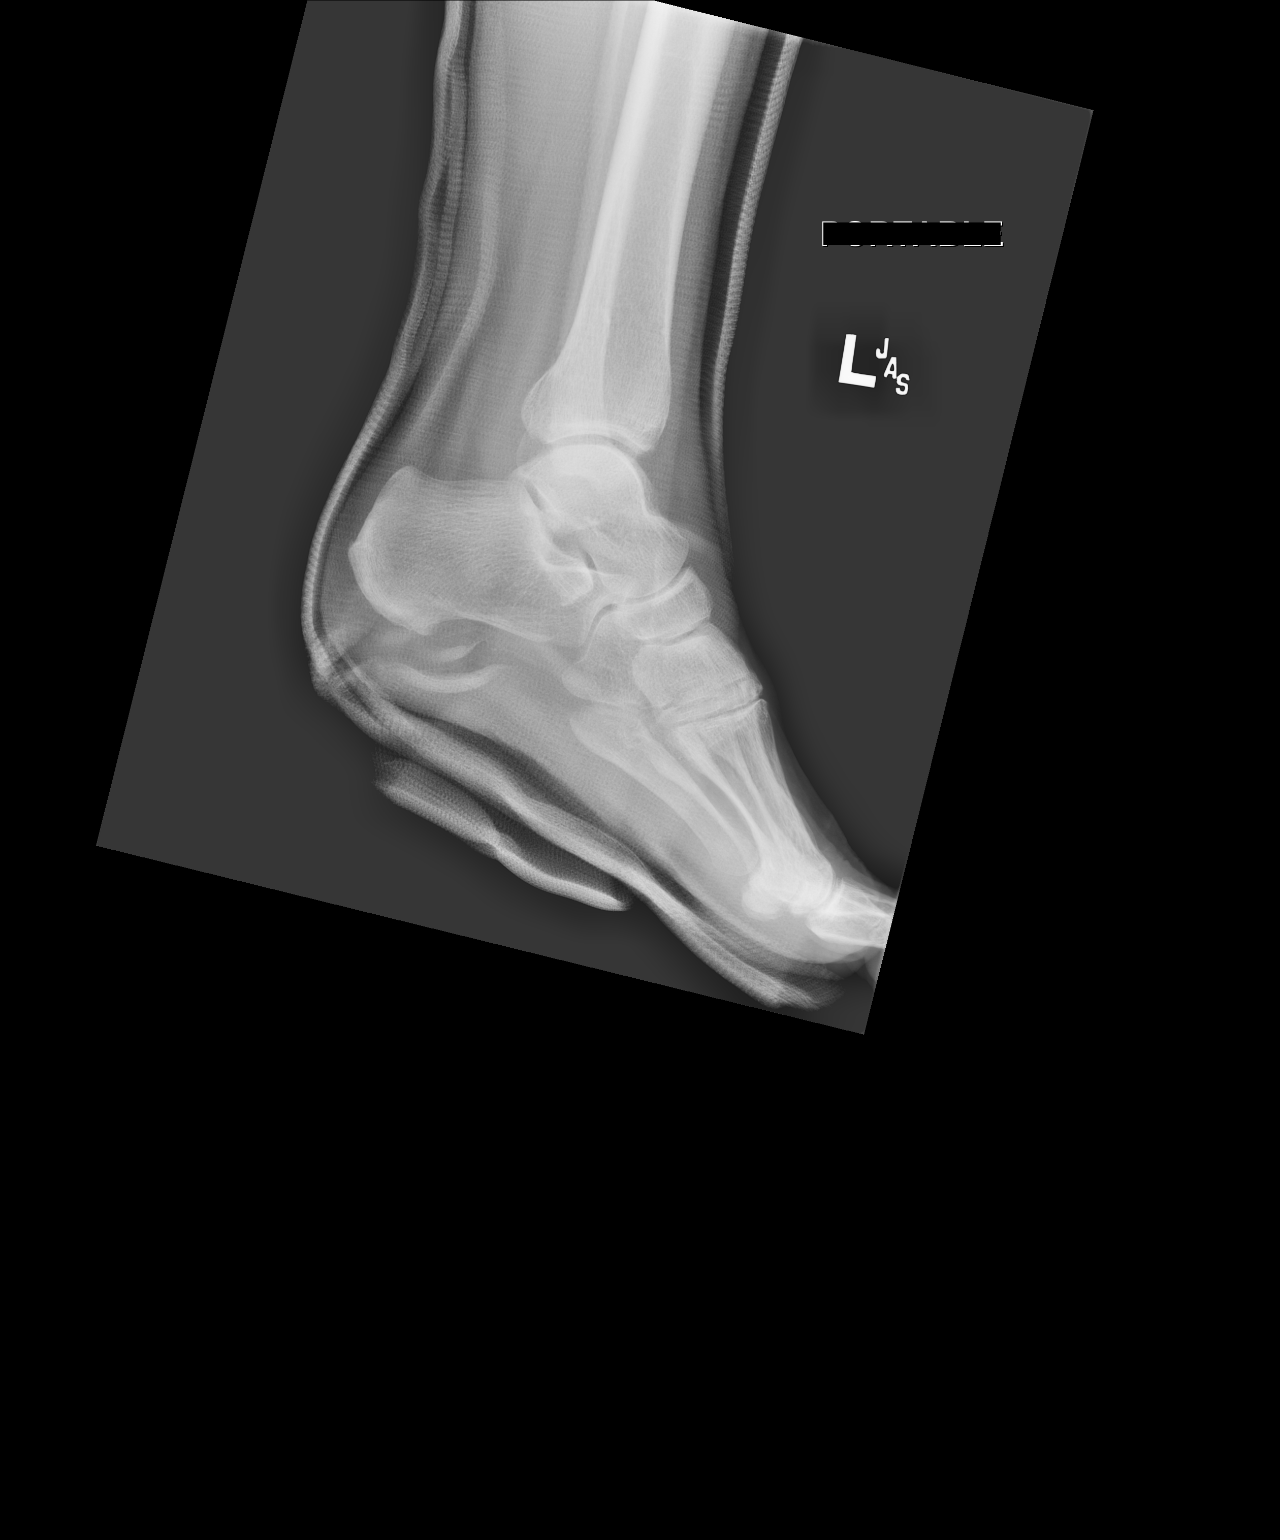

[2 of 2 positions shown; findings below may reference images not displayed]

FINDINGS: There is improved alignment of the medial malleolar fracture. A
minimally displaced distal fibular fracture is again noted. There is
mild residual lateral tilt of the talus, with mild widening of the
medial aspect of the ankle mortise.

Mild surrounding soft tissue swelling is noted. A splint is noted
about the lower leg and ankle.
IMPRESSION: Improved alignment of the medial malleolar fracture. Minimally
displaced distal fibular fracture again noted. Mild residual lateral
tilt of the talus, with mild widening of the medial aspect of the
ankle mortise.

## 2018-04-07 IMAGING — CT CT HEAD W/O CM
4 of 8 series · 21 of 47 positions shown, 23 images · non-contrast
Comparison: None.

CLINICAL DATA: Status post fall.

EXAM:
CT HEAD WITHOUT CONTRAST
CT CERVICAL SPINE WITHOUT CONTRAST
TECHNIQUE: Multidetector CT imaging of the head and cervical spine was
performed following the standard protocol without intravenous
contrast. Multiplanar CT image reconstructions of the cervical spine
were also generated.

[Series 203: coronal st, idose (1) · coronal · 0.40mm/px · 3 of 64 slices shown]
[im 19/64  brain]
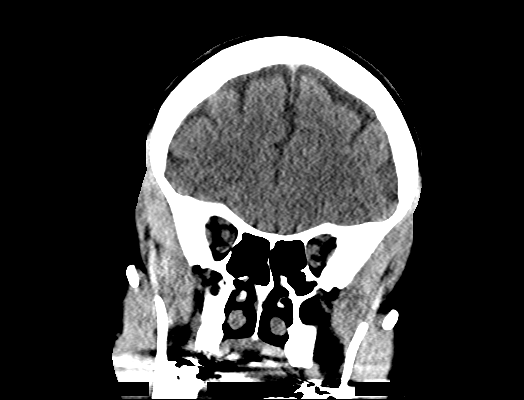
[im 28/64  brain]
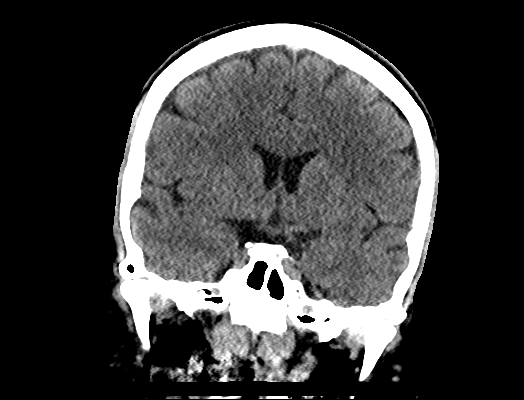
[im 37/64  brain]
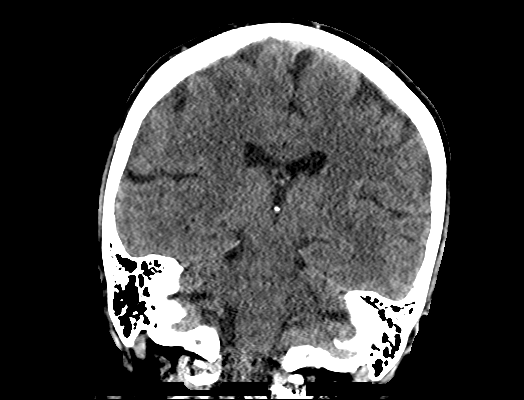

[Series 204: sagittal st, idose (1) · sagittal · 0.40mm/px · 2 of 53 slices shown]
[im 18/53  brain]
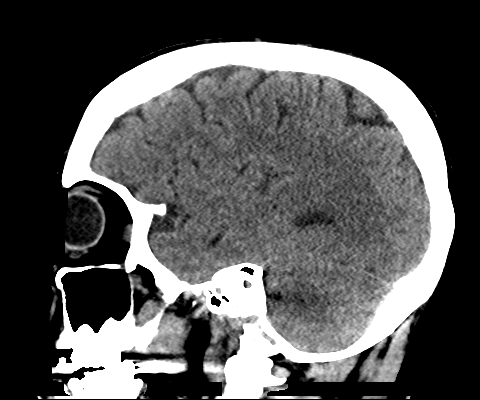
[im 35/53  brain]
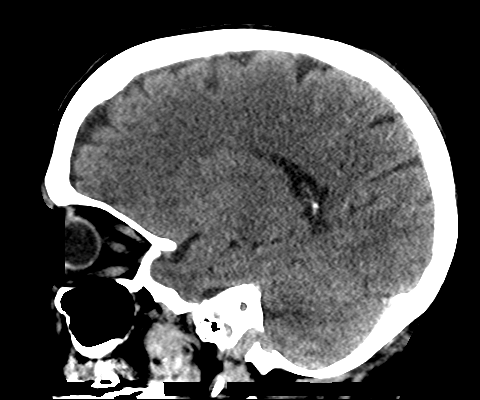

[Series 302: soft tissue, idose (2) · axial · 0.28mm/px · z∈[-58,+84]mm · 8 of 93 slices shown, 10 images]
[im 11/93  brain]
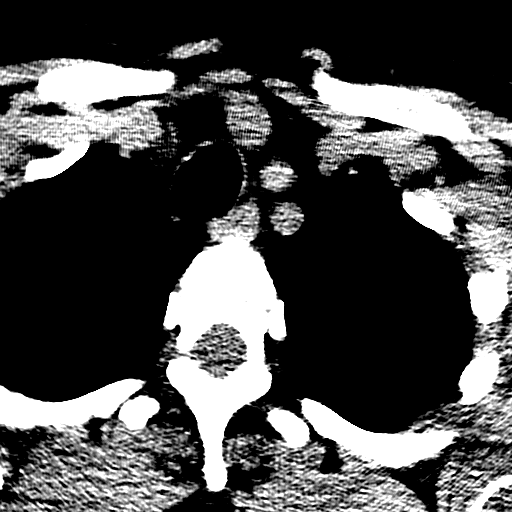
[im 11/93  bone]
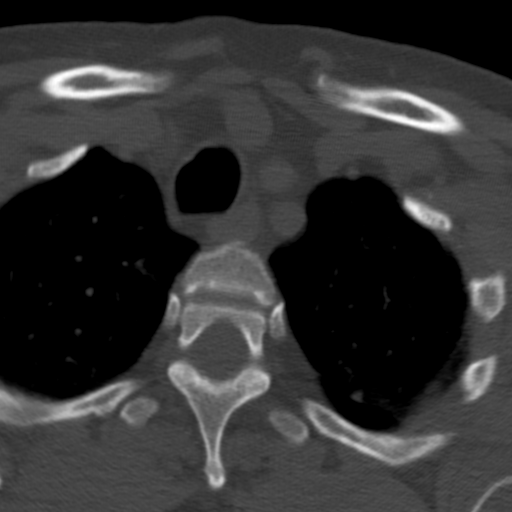
[im 21/93  brain]
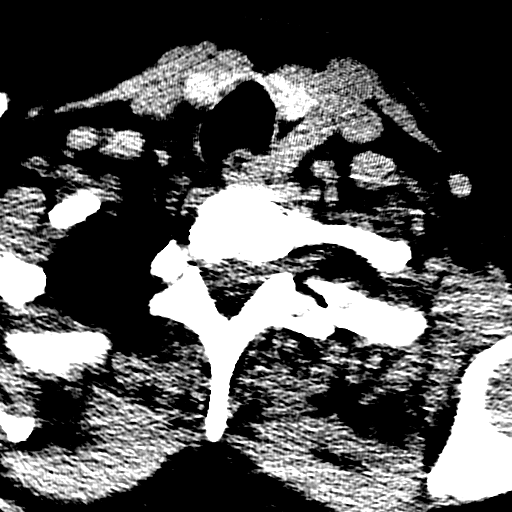
[im 31/93  brain]
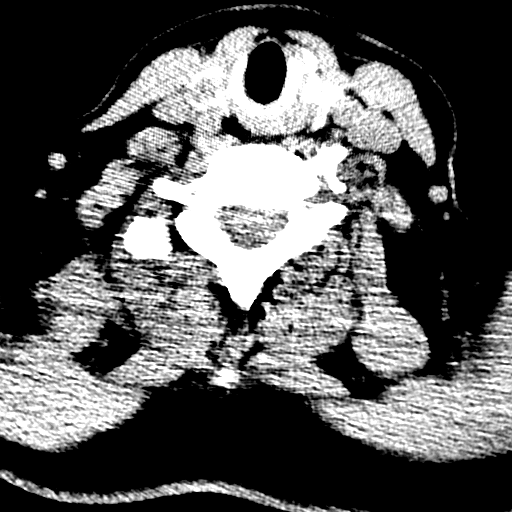
[im 41/93  brain]
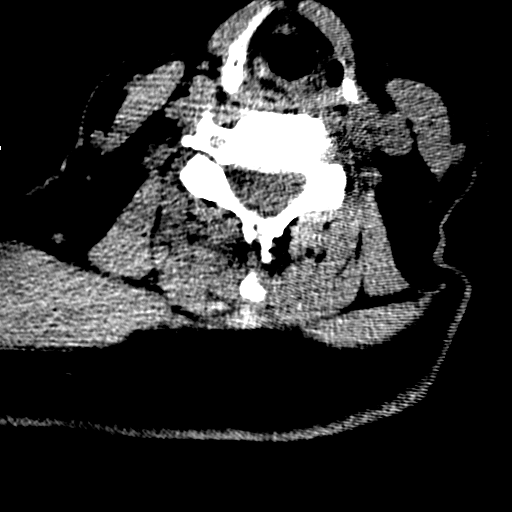
[im 52/93  brain]
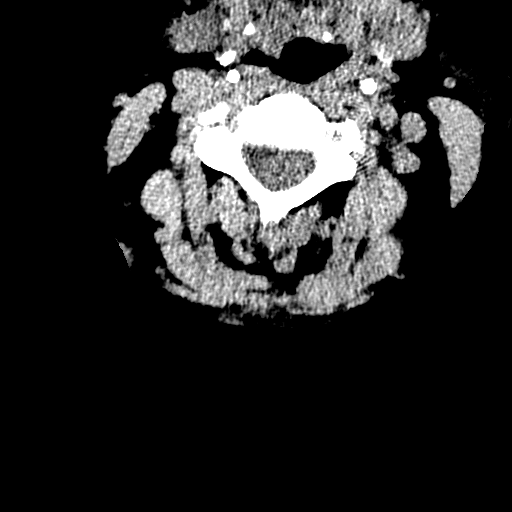
[im 52/93  bone]
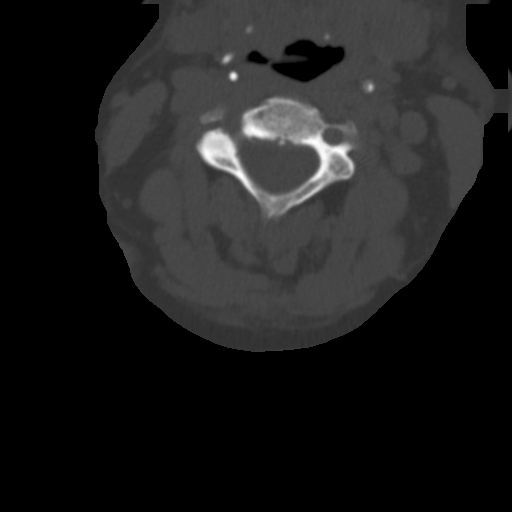
[im 62/93  brain]
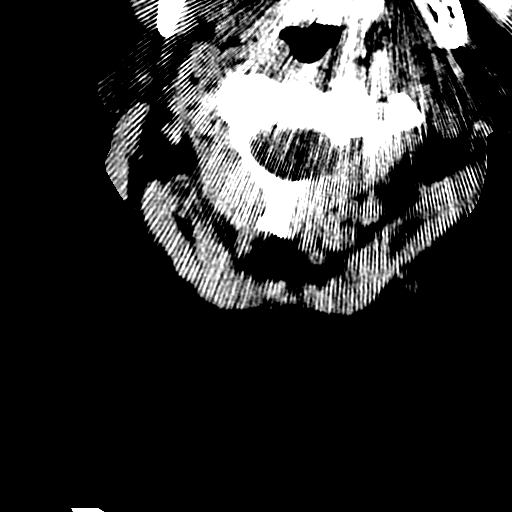
[im 72/93  brain]
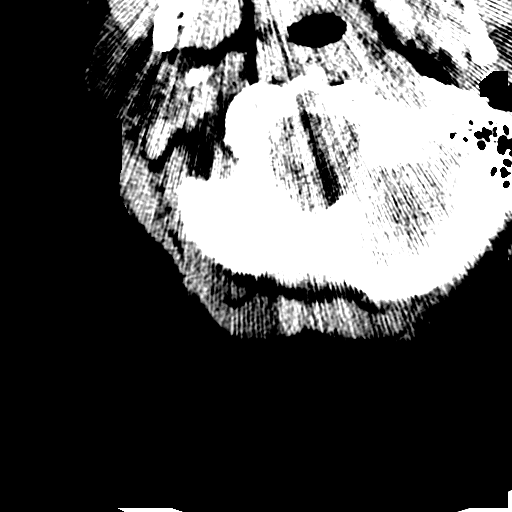
[im 82/93  brain]
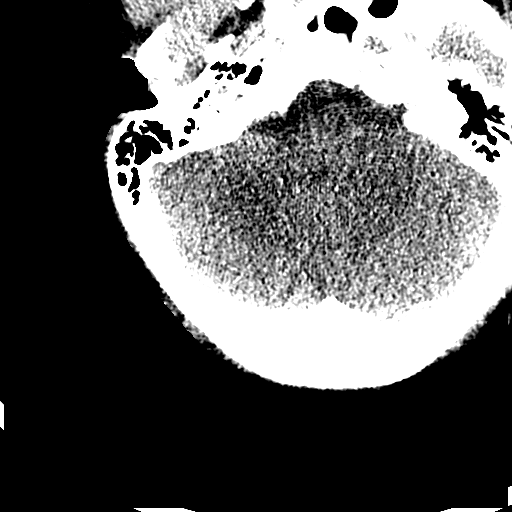

[Series 306: orthogonals, idose (2) · axial · 0.35mm/px · z∈[-75,+61]mm · 8 of 93 slices shown]
[im 11/93  brain]
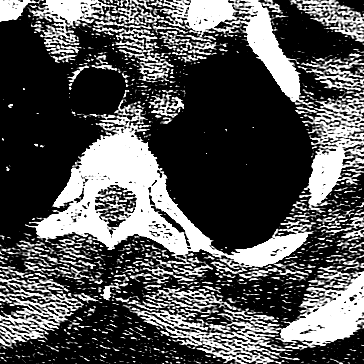
[im 21/93  brain]
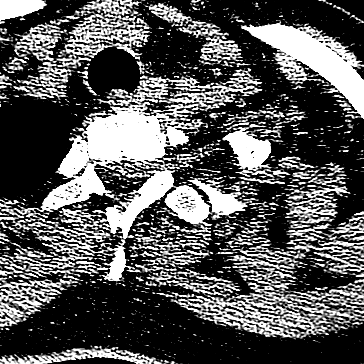
[im 31/93  brain]
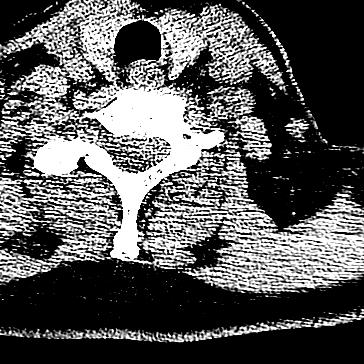
[im 41/93  brain]
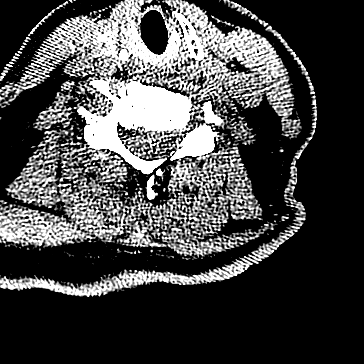
[im 52/93  brain]
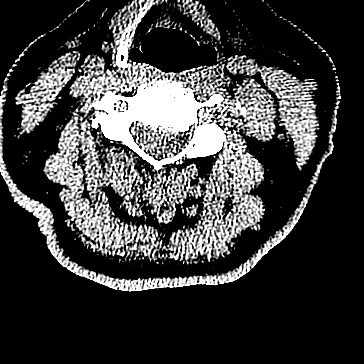
[im 62/93  brain]
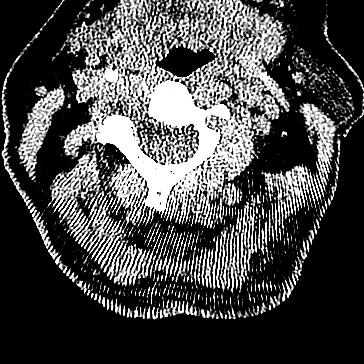
[im 72/93  brain]
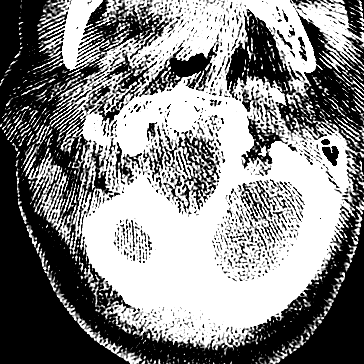
[im 82/93  brain]
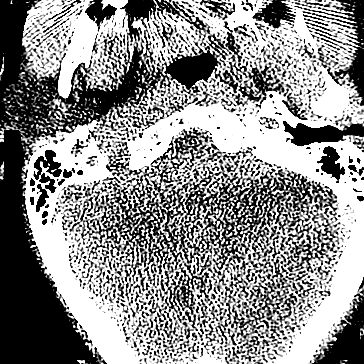

[21 of 47 positions shown; findings below may reference images not displayed]

FINDINGS: CT HEAD FINDINGS

Brain: Ventricles are normal in size and configuration. There is no
hemorrhage, edema or other evidence of acute parenchymal
abnormality. No extra-axial hemorrhage.

Vascular: No hyperdense vessel or unexpected calcification.

Skull: Normal. Negative for fracture or focal lesion.

Sinuses/Orbits: No acute finding.

Other: Soft tissue edema/laceration overlying the posterior left
parietal-occipital bone. No underlying fracture. Slight deformity of
the left nasal bone, likely chronic.

CT CERVICAL SPINE FINDINGS

Alignment: Mild straightening of the normal cervical lordosis
related to underlying degenerative change. No evidence of acute
vertebral body subluxation.

Skull base and vertebrae: No fracture line or displaced fracture
fragment identified. Facet joints appear intact and normally aligned
throughout.

Soft tissues and spinal canal: No prevertebral fluid or swelling. No
visible canal hematoma.

Disc levels: Mild degenerative spurring throughout the mid and lower
cervical spine, with associated mild disc-osteophytic bulges at the
C3-4 through C6-7 levels. No more than mild central canal stenosis
at any level.

Upper chest: Nodular consolidation within the medial aspects of the
left lung apex, measuring 8 mm (series 302, image 90), possibly a
semi-solid nodule, too small to definitively characterize.

Other: Subcentimeter hypodense focus within the right thyroid lobe,
most likely a benign colloid nodule. Carotid atherosclerosis.
IMPRESSION: 1. Soft tissue edema/laceration overlying the left posterior
parietal-occipital bone. No underlying fracture.
2. No acute intracranial abnormality. No intracranial hemorrhage or
edema.
3. No fracture or acute subluxation within the cervical spine.
Degenerative changes of the cervical spine, as detailed above.
4. **An incidental finding of potential clinical significance has
been found. Nodular consolidation within the left lung apex,
measuring 8 mm, possibly semi-solid nodule, alternatively chronic
scarring or atelectasis. Follow-up non-contrast CT recommended at
3-6 months to confirm persistence. If unchanged, and solid component
remains <6 mm, annual CT is recommended until 5 years of stability
has been established. If persistent these nodules should be
considered highly suspicious if the solid component of the nodule is
6 mm or greater in size and enlarging. This recommendation follows
the consensus statement: Guidelines for Management of Incidental
Pulmonary Nodules Detected on CT Images: From the [HOSPITAL]
5. Carotid atherosclerosis.

## 2018-04-10 IMAGING — DX DG ANKLE 2V *R*
2 series · 2 of 2 positions shown · non-contrast
Comparison: CT right ankle on plain films right ankle 03/26/2017.

CLINICAL DATA: Right ankle fracture due to a fall through and attic
floor 03/26/2017. Initial encounter.

EXAM:
RIGHT ANKLE - 2 VIEW

[ankle ap]
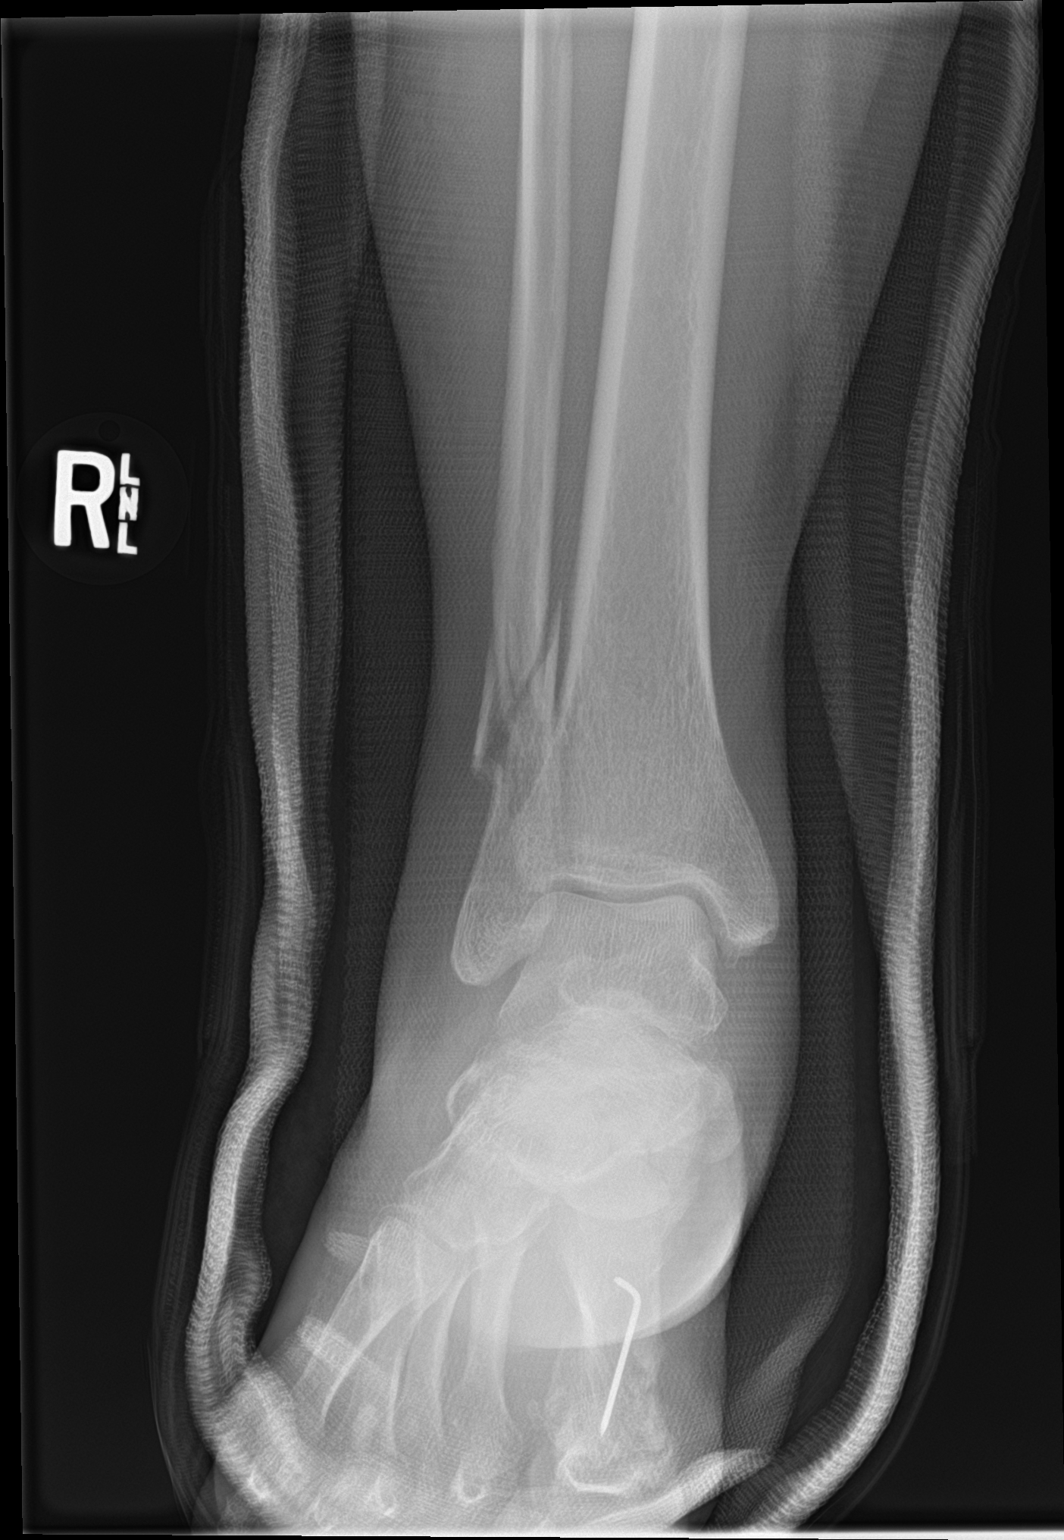

[ankle lat]
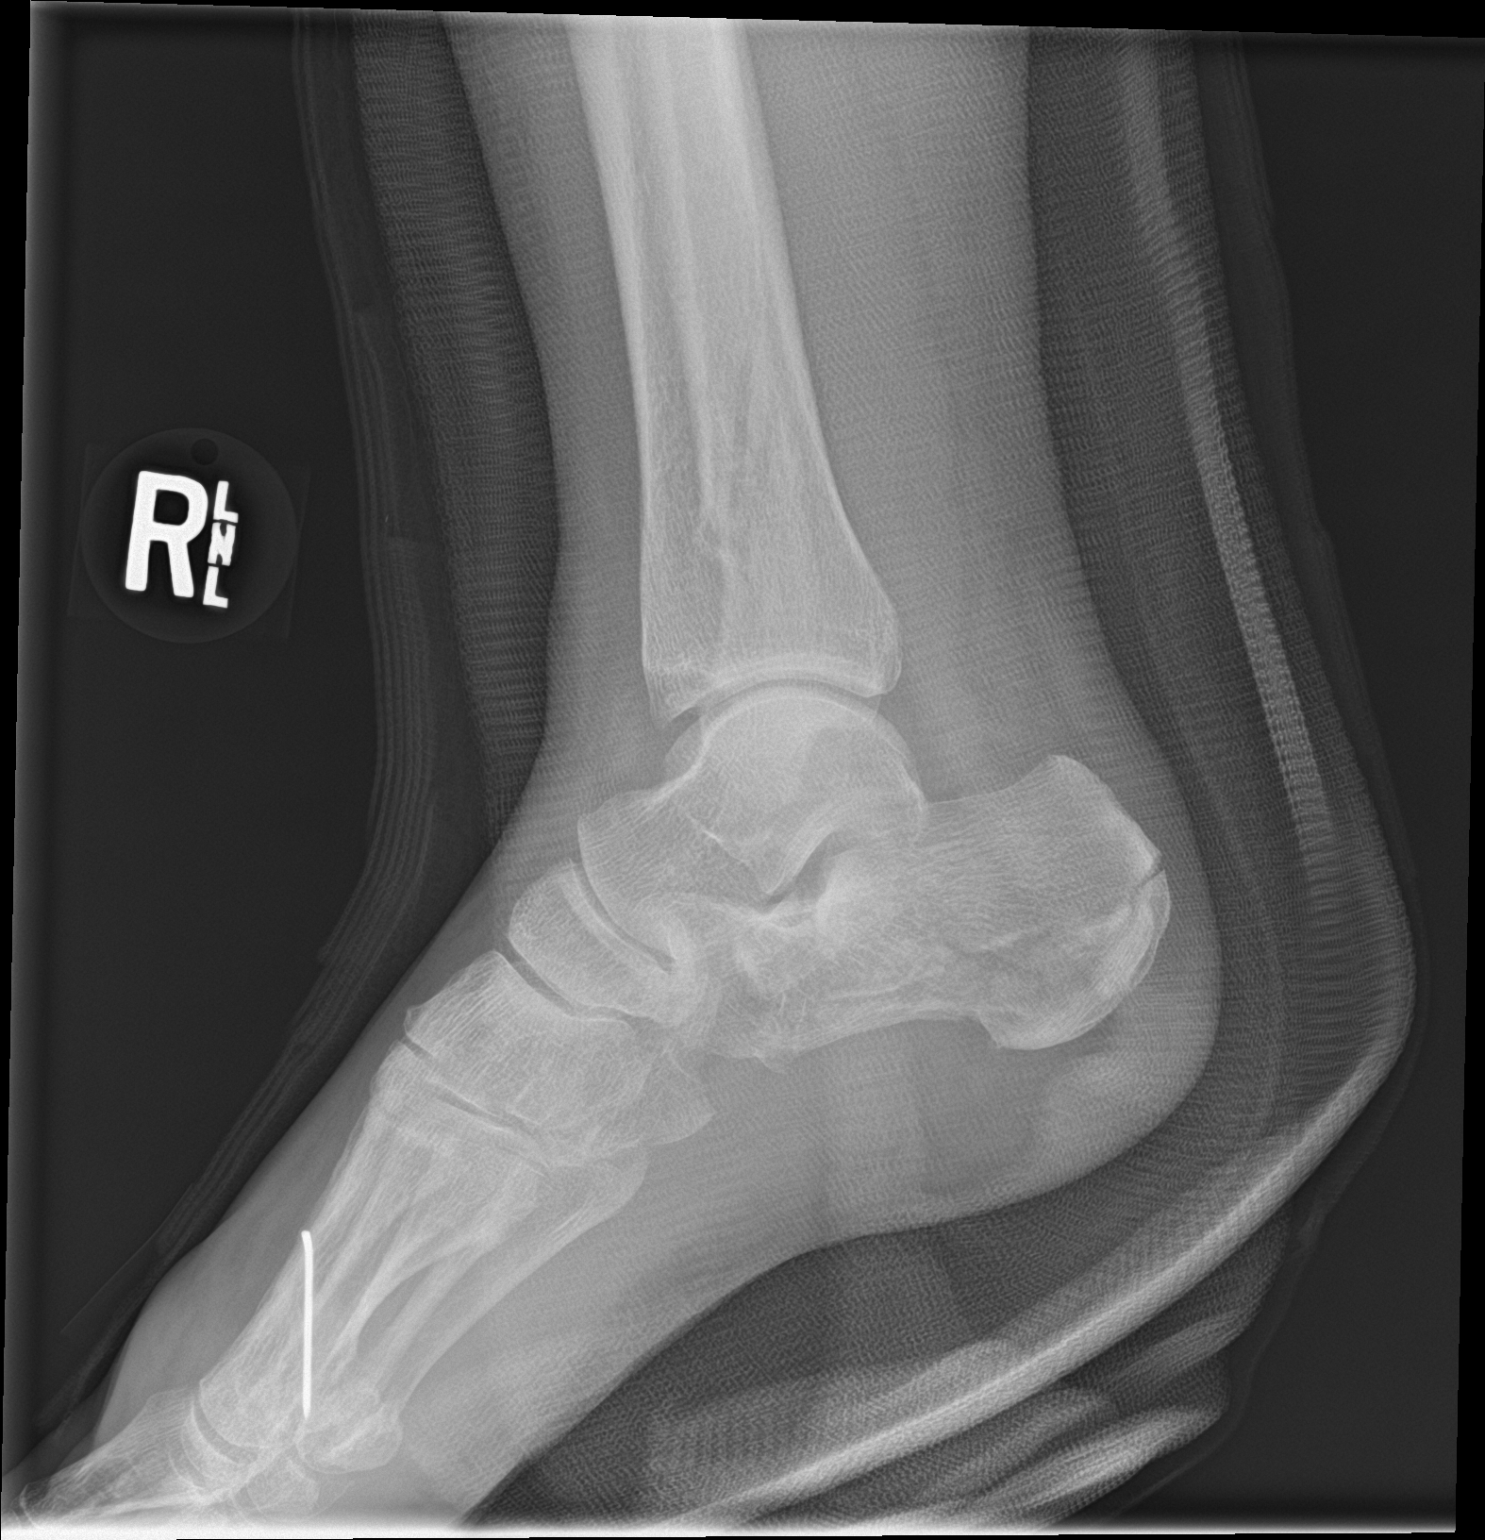

[2 of 2 positions shown; findings below may reference images not displayed]

FINDINGS: Distal fibular and calcaneus fractures are again identified. The
patient is in a new fiberglass splint. Position and alignment are
not notably changed. Soft tissue swelling about the ankle has
increased compared to the prior plain films.
IMPRESSION: No change in position and alignment of distal fibular and calcaneal
fractures with the patient in a splint. Soft tissue swelling about
the ankle has increased.

## 2018-04-11 DIAGNOSIS — M47816 Spondylosis without myelopathy or radiculopathy, lumbar region: Secondary | ICD-10-CM | POA: Diagnosis not present

## 2018-04-11 DIAGNOSIS — Z716 Tobacco abuse counseling: Secondary | ICD-10-CM | POA: Diagnosis not present

## 2018-04-11 DIAGNOSIS — F17210 Nicotine dependence, cigarettes, uncomplicated: Secondary | ICD-10-CM | POA: Diagnosis not present

## 2018-04-12 DIAGNOSIS — M47816 Spondylosis without myelopathy or radiculopathy, lumbar region: Secondary | ICD-10-CM | POA: Diagnosis not present

## 2018-04-17 ENCOUNTER — Telehealth: Payer: Self-pay | Admitting: Podiatry

## 2018-04-17 NOTE — Telephone Encounter (Signed)
I think she has seen a few pain management doctors and neurology. If she does not see pain management at this time we should refer her. Thanks.

## 2018-04-17 NOTE — Telephone Encounter (Signed)
Patient stated that she still having severe pain and has doubled up on her medication and she still is in a lot of pain. She is also using the cream that was prescribed to her. She was wondering is there anything else she can do. Please Call.

## 2018-04-17 NOTE — Telephone Encounter (Signed)
I would continue the gabapentin because I think a lot of her pain is nerve related. She needs to follow-up with her pain management doctor.

## 2018-04-17 NOTE — Telephone Encounter (Signed)
I spoke with pt and she states she has an appt with Dr. Maryjean Ka' PA at the end of June and began taking the Gabapentin 100mg  2 tablets 4 times a day, has oxycodone that she is not taking because it doesn't help her feet.

## 2018-04-18 NOTE — Telephone Encounter (Signed)
I informed pt Dr. Jacqualyn Posey had said to continue the gabapentin, contact the pain management doctors for any change that is necessary in the gabapentin, or earlier appt.

## 2018-04-24 DIAGNOSIS — L718 Other rosacea: Secondary | ICD-10-CM | POA: Diagnosis not present

## 2018-04-24 DIAGNOSIS — L820 Inflamed seborrheic keratosis: Secondary | ICD-10-CM | POA: Diagnosis not present

## 2018-05-01 ENCOUNTER — Telehealth: Payer: Self-pay | Admitting: Neurology

## 2018-05-01 DIAGNOSIS — M47816 Spondylosis without myelopathy or radiculopathy, lumbar region: Secondary | ICD-10-CM | POA: Diagnosis not present

## 2018-05-01 DIAGNOSIS — M79672 Pain in left foot: Principal | ICD-10-CM

## 2018-05-01 DIAGNOSIS — M79671 Pain in right foot: Secondary | ICD-10-CM

## 2018-05-01 NOTE — Telephone Encounter (Signed)
Patient calling. She has had severe breakthrough pain in her feet 3 times in the last 2 weeks and the pain lasts for hours. She would like to have a NCV/EMG. Out of desperation she is having acupuncture this Wednesday. Could this cause any harm? Please call and discuss.

## 2018-05-01 NOTE — Telephone Encounter (Signed)
I called the patient.  The patient indicates that she is still having bilateral foot pain since the fall that she had.  She has sharp lancinating pains in the feet, she claims that she has had an evaluation of the low back and was told that the low back was not the problem.  We will set up for EMG nerve conduction study evaluation.

## 2018-05-01 NOTE — Addendum Note (Signed)
Addended by: Kathrynn Ducking on: 05/01/2018 01:01 PM   Modules accepted: Orders

## 2018-05-02 ENCOUNTER — Telehealth: Payer: Self-pay | Admitting: Neurology

## 2018-05-02 NOTE — Telephone Encounter (Signed)
Patient states she would like for Dr. 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 She states her pain management MD states that her nerve problem is not from her back, it's from her accident. She states she has lower back problems.

## 2018-05-02 NOTE — Telephone Encounter (Signed)
Information is noted.

## 2018-05-05 ENCOUNTER — Telehealth: Payer: Self-pay | Admitting: *Deleted

## 2018-05-05 DIAGNOSIS — G629 Polyneuropathy, unspecified: Secondary | ICD-10-CM

## 2018-05-05 NOTE — Telephone Encounter (Signed)
Dr. Jacqualyn Posey ordered [employer] for pt with [facility] - In-office. Left message informing pt of Dr. Leigh Aurora orders and to expect a call from [facility].

## 2018-05-05 NOTE — Telephone Encounter (Signed)
Pt states she received my message concerning [employer] and will be back in town 05/14/2018 and would like to go ahead and be scheduled any time after Sunday. Note written on pt's PT order form.

## 2018-05-17 DIAGNOSIS — M25672 Stiffness of left ankle, not elsewhere classified: Secondary | ICD-10-CM | POA: Diagnosis not present

## 2018-05-17 DIAGNOSIS — M25572 Pain in left ankle and joints of left foot: Secondary | ICD-10-CM | POA: Diagnosis not present

## 2018-05-17 DIAGNOSIS — M25671 Stiffness of right ankle, not elsewhere classified: Secondary | ICD-10-CM | POA: Diagnosis not present

## 2018-05-17 DIAGNOSIS — M25571 Pain in right ankle and joints of right foot: Secondary | ICD-10-CM | POA: Diagnosis not present

## 2018-05-19 DIAGNOSIS — M25572 Pain in left ankle and joints of left foot: Secondary | ICD-10-CM | POA: Diagnosis not present

## 2018-05-19 DIAGNOSIS — M25672 Stiffness of left ankle, not elsewhere classified: Secondary | ICD-10-CM | POA: Diagnosis not present

## 2018-05-19 DIAGNOSIS — M25571 Pain in right ankle and joints of right foot: Secondary | ICD-10-CM | POA: Diagnosis not present

## 2018-05-19 DIAGNOSIS — M25671 Stiffness of right ankle, not elsewhere classified: Secondary | ICD-10-CM | POA: Diagnosis not present

## 2018-05-23 DIAGNOSIS — M25571 Pain in right ankle and joints of right foot: Secondary | ICD-10-CM | POA: Diagnosis not present

## 2018-05-23 DIAGNOSIS — M25572 Pain in left ankle and joints of left foot: Secondary | ICD-10-CM | POA: Diagnosis not present

## 2018-05-23 DIAGNOSIS — M25671 Stiffness of right ankle, not elsewhere classified: Secondary | ICD-10-CM | POA: Diagnosis not present

## 2018-05-23 DIAGNOSIS — M25672 Stiffness of left ankle, not elsewhere classified: Secondary | ICD-10-CM | POA: Diagnosis not present

## 2018-05-25 DIAGNOSIS — M25572 Pain in left ankle and joints of left foot: Secondary | ICD-10-CM | POA: Diagnosis not present

## 2018-05-25 DIAGNOSIS — M25571 Pain in right ankle and joints of right foot: Secondary | ICD-10-CM | POA: Diagnosis not present

## 2018-05-25 DIAGNOSIS — M25672 Stiffness of left ankle, not elsewhere classified: Secondary | ICD-10-CM | POA: Diagnosis not present

## 2018-05-25 DIAGNOSIS — M25671 Stiffness of right ankle, not elsewhere classified: Secondary | ICD-10-CM | POA: Diagnosis not present

## 2018-05-26 DIAGNOSIS — R030 Elevated blood-pressure reading, without diagnosis of hypertension: Secondary | ICD-10-CM | POA: Diagnosis not present

## 2018-05-26 DIAGNOSIS — G5790 Unspecified mononeuropathy of unspecified lower limb: Secondary | ICD-10-CM | POA: Diagnosis not present

## 2018-05-29 DIAGNOSIS — M25672 Stiffness of left ankle, not elsewhere classified: Secondary | ICD-10-CM | POA: Diagnosis not present

## 2018-05-29 DIAGNOSIS — M25671 Stiffness of right ankle, not elsewhere classified: Secondary | ICD-10-CM | POA: Diagnosis not present

## 2018-05-29 DIAGNOSIS — M47816 Spondylosis without myelopathy or radiculopathy, lumbar region: Secondary | ICD-10-CM | POA: Diagnosis not present

## 2018-05-29 DIAGNOSIS — M25571 Pain in right ankle and joints of right foot: Secondary | ICD-10-CM | POA: Diagnosis not present

## 2018-05-29 DIAGNOSIS — M25572 Pain in left ankle and joints of left foot: Secondary | ICD-10-CM | POA: Diagnosis not present

## 2018-05-30 ENCOUNTER — Ambulatory Visit (INDEPENDENT_AMBULATORY_CARE_PROVIDER_SITE_OTHER): Payer: Medicare Other | Admitting: Neurology

## 2018-05-30 ENCOUNTER — Encounter

## 2018-05-30 ENCOUNTER — Encounter: Payer: Self-pay | Admitting: Neurology

## 2018-05-30 DIAGNOSIS — M25571 Pain in right ankle and joints of right foot: Secondary | ICD-10-CM

## 2018-05-30 DIAGNOSIS — M25572 Pain in left ankle and joints of left foot: Secondary | ICD-10-CM

## 2018-05-30 DIAGNOSIS — G8929 Other chronic pain: Secondary | ICD-10-CM

## 2018-05-30 DIAGNOSIS — M79672 Pain in left foot: Secondary | ICD-10-CM

## 2018-05-30 DIAGNOSIS — M79671 Pain in right foot: Secondary | ICD-10-CM | POA: Diagnosis not present

## 2018-05-30 NOTE — Progress Notes (Addendum)
The patient comes in for EMG and nerve conduction study today.  Studies do not show clear evidence of a peripheral neuropathy and there is no evidence of lumbosacral radiculopathies on either side.  The patient is followed through a pain center, she may consider a spinal stimulator in the future.    [place]    Nerve / Sites Muscle Latency Ref. Amplitude Ref. Rel Amp Segments Distance Velocity Ref. Area    ms ms mV mV %  cm m/s m/s mVms  R Peroneal - EDB     Ankle EDB 5.7 ?6.5 1.9 ?2.0 100 Ankle - EDB 9   5.0     Fib head EDB 10.7  1.7  89.3 Fib head - Ankle 24 48 ?44 4.2     Pop fossa EDB 12.9  1.5  87.3 Pop fossa - Fib head 10 46 ?44 3.4         Pop fossa - Ankle      L Peroneal - EDB     Ankle EDB 4.4 ?6.5 5.5 ?2.0 100 Ankle - EDB 9   13.9     Fib head EDB 9.5  4.6  84.5 Fib head - Ankle 24 48 ?44 12.2     Pop fossa EDB 11.5  4.8  103 Pop fossa - Fib head 10 49 ?44 14.1         Pop fossa - Ankle      R Tibial - AH     Ankle AH 4.0 ?5.8 10.2 ?4.0 100 Ankle - AH 9   26.3     Pop fossa AH 10.7  8.0  78.7 Pop fossa - Ankle 30 45 ?41 24.3  L Tibial - AH     Ankle AH 4.4 ?5.8 12.1 ?4.0 100 Ankle - AH 9   33.7     Pop fossa AH 11.2  9.8  80.5 Pop fossa - Ankle 30 44 ?41 30.1             SNC    Nerve / Sites Rec. Site Peak Lat Ref.  Amp Ref. Segments Distance    ms ms V V  cm  R Sural - Ankle (Calf)     Calf Ankle 3.5 ?4.4 5 ?6 Calf - Ankle 14  L Sural - Ankle (Calf)     Calf Ankle 3.4 ?4.4 12 ?6 Calf - Ankle 14  R Superficial peroneal - Ankle     Lat leg Ankle 3.7 ?4.4 6 ?6 Lat leg - Ankle 14  L Superficial peroneal - Ankle     Lat leg Ankle 3.6 ?4.4 4 ?6 Lat leg - Ankle 14              F  Wave    Nerve F Lat Ref.   ms ms  R Tibial - AH 46.5 ?56.0  L Tibial - AH 45.5 ?56.0

## 2018-05-30 NOTE — Procedures (Signed)
     HISTORY:  Nichole Delgado is a 67 year old patient with a history of an injury to both feet with ankle fractures following a fall in 2018.  The patient has had neuropathic type pain in both feet since that time, she does have a history of low back pain.  She is being evaluated for the ongoing discomfort.  NERVE CONDUCTION STUDIES:  Nerve conduction studies were performed on both lower extremities.  The distal motor latencies for the peroneal and posterior tibial nerves were normal bilaterally with a slight lowering of motor amplitude for the right peroneal nerve, normal on the left.  Motor amplitudes for the posterior tibial nerves were normal.  The nerve conduction velocities for the peroneal and posterior tibial nerves were normal bilaterally.  The sensory latencies for the sural and peroneal nerves were normal bilaterally.  The F-wave latencies for the posterior tibial nerves were normal bilaterally.  EMG STUDIES:  EMG study was performed on the right lower extremity:  The tibialis anterior muscle reveals 2 to 4K motor units with full recruitment. No fibrillations or positive waves were seen. The peroneus tertius muscle reveals 2 to 4K motor units with full recruitment. No fibrillations or positive waves were seen. The medial gastrocnemius muscle reveals 1 to 3K motor units with full recruitment. No fibrillations or positive waves were seen. The vastus lateralis muscle reveals 2 to 4K motor units with full recruitment. No fibrillations or positive waves were seen. The iliopsoas muscle reveals 2 to 4K motor units with full recruitment. No fibrillations or positive waves were seen. The biceps femoris muscle (long head) reveals 2 to 4K motor units with full recruitment. No fibrillations or positive waves were seen. The lumbosacral paraspinal muscles were tested at 3 levels, and revealed no abnormalities of insertional activity at all 3 levels tested. There was good relaxation.  EMG study  was performed on the left lower extremity:  The tibialis anterior muscle reveals 2 to 4K motor units with full recruitment. No fibrillations or positive waves were seen. The peroneus tertius muscle reveals 2 to 4K motor units with full recruitment. No fibrillations or positive waves were seen. The medial gastrocnemius muscle reveals 1 to 3K motor units with full recruitment. No fibrillations or positive waves were seen. The vastus lateralis muscle reveals 2 to 4K motor units with full recruitment. No fibrillations or positive waves were seen. The iliopsoas muscle reveals 2 to 4K motor units with full recruitment. No fibrillations or positive waves were seen. The biceps femoris muscle (long head) reveals 2 to 4K motor units with full recruitment. No fibrillations or positive waves were seen. The lumbosacral paraspinal muscles were tested at 3 levels, and revealed no abnormalities of insertional activity at all 3 levels tested. There was good relaxation.   IMPRESSION:  Nerve conduction studies done on both lower extremities do not show evidence of a peripheral neuropathy.  There is slight lowering of the motor amplitudes for the right peroneal nerve that could be associated with mild distal dysfunction of this nerve.  Otherwise, EMG evaluation of both lower extremities were completely normal, no evidence of an overlying lumbosacral radiculopathy was seen on either side.   Jill Alexanders MD 05/30/2018 9:33 AM    [facility] [address] [place], [place] [address]  Phone [phone] Fax [phone]

## 2018-05-30 NOTE — Progress Notes (Signed)
Please refer to EMG and nerve conduction study procedure note. 

## 2018-06-05 ENCOUNTER — Telehealth: Payer: Self-pay | Admitting: Neurology

## 2018-06-05 ENCOUNTER — Encounter: Payer: Medicare Other | Admitting: Neurology

## 2018-06-05 NOTE — Telephone Encounter (Signed)
I called the patient.  The nerve conduction studies were essentially normal, no evidence of a neuropathy.  My concern would be the patient could potentially have a complex regional pain syndrome issue, would possibly consider sympathetic nerve blocks to see if this improves her pain.  The patient is followed by Dr. Maryjean Ka for her chronic pain, I would have the patient discussed this issue with him.

## 2018-06-05 NOTE — Telephone Encounter (Signed)
Pt states re: her cerve conduction study she states she is not clear on the results re: her feet and ankles, she is asking for a call to discuss.  Pt wants Dr Jannifer Franklin to know they are more debilitating than her Ortho problems. Pt asking if there is a procedure and medication that could be suggested for the pain. Pt states she is willing to consider spinal simulator, please call

## 2018-06-07 DIAGNOSIS — M25571 Pain in right ankle and joints of right foot: Secondary | ICD-10-CM | POA: Diagnosis not present

## 2018-06-07 DIAGNOSIS — M25572 Pain in left ankle and joints of left foot: Secondary | ICD-10-CM | POA: Diagnosis not present

## 2018-06-07 DIAGNOSIS — M25671 Stiffness of right ankle, not elsewhere classified: Secondary | ICD-10-CM | POA: Diagnosis not present

## 2018-06-07 DIAGNOSIS — K580 Irritable bowel syndrome with diarrhea: Secondary | ICD-10-CM | POA: Diagnosis not present

## 2018-06-07 DIAGNOSIS — M25672 Stiffness of left ankle, not elsewhere classified: Secondary | ICD-10-CM | POA: Diagnosis not present

## 2018-06-14 DIAGNOSIS — Z6826 Body mass index (BMI) 26.0-26.9, adult: Secondary | ICD-10-CM | POA: Diagnosis not present

## 2018-06-14 DIAGNOSIS — G5790 Unspecified mononeuropathy of unspecified lower limb: Secondary | ICD-10-CM | POA: Diagnosis not present

## 2018-06-16 DIAGNOSIS — M25672 Stiffness of left ankle, not elsewhere classified: Secondary | ICD-10-CM | POA: Diagnosis not present

## 2018-06-16 DIAGNOSIS — M25572 Pain in left ankle and joints of left foot: Secondary | ICD-10-CM | POA: Diagnosis not present

## 2018-06-16 DIAGNOSIS — M25571 Pain in right ankle and joints of right foot: Secondary | ICD-10-CM | POA: Diagnosis not present

## 2018-06-16 DIAGNOSIS — M25671 Stiffness of right ankle, not elsewhere classified: Secondary | ICD-10-CM | POA: Diagnosis not present

## 2018-06-21 DIAGNOSIS — M25572 Pain in left ankle and joints of left foot: Secondary | ICD-10-CM | POA: Diagnosis not present

## 2018-06-21 DIAGNOSIS — M25671 Stiffness of right ankle, not elsewhere classified: Secondary | ICD-10-CM | POA: Diagnosis not present

## 2018-06-21 DIAGNOSIS — M25672 Stiffness of left ankle, not elsewhere classified: Secondary | ICD-10-CM | POA: Diagnosis not present

## 2018-06-21 DIAGNOSIS — M25571 Pain in right ankle and joints of right foot: Secondary | ICD-10-CM | POA: Diagnosis not present

## 2018-06-26 DIAGNOSIS — M25671 Stiffness of right ankle, not elsewhere classified: Secondary | ICD-10-CM | POA: Diagnosis not present

## 2018-06-26 DIAGNOSIS — M25571 Pain in right ankle and joints of right foot: Secondary | ICD-10-CM | POA: Diagnosis not present

## 2018-06-26 DIAGNOSIS — M25672 Stiffness of left ankle, not elsewhere classified: Secondary | ICD-10-CM | POA: Diagnosis not present

## 2018-06-26 DIAGNOSIS — M25572 Pain in left ankle and joints of left foot: Secondary | ICD-10-CM | POA: Diagnosis not present

## 2018-06-29 DIAGNOSIS — M25671 Stiffness of right ankle, not elsewhere classified: Secondary | ICD-10-CM | POA: Diagnosis not present

## 2018-06-29 DIAGNOSIS — M25571 Pain in right ankle and joints of right foot: Secondary | ICD-10-CM | POA: Diagnosis not present

## 2018-06-29 DIAGNOSIS — M25672 Stiffness of left ankle, not elsewhere classified: Secondary | ICD-10-CM | POA: Diagnosis not present

## 2018-06-29 DIAGNOSIS — M25572 Pain in left ankle and joints of left foot: Secondary | ICD-10-CM | POA: Diagnosis not present

## 2018-07-04 DIAGNOSIS — M25671 Stiffness of right ankle, not elsewhere classified: Secondary | ICD-10-CM | POA: Diagnosis not present

## 2018-07-04 DIAGNOSIS — M25672 Stiffness of left ankle, not elsewhere classified: Secondary | ICD-10-CM | POA: Diagnosis not present

## 2018-07-04 DIAGNOSIS — M25571 Pain in right ankle and joints of right foot: Secondary | ICD-10-CM | POA: Diagnosis not present

## 2018-07-04 DIAGNOSIS — M25572 Pain in left ankle and joints of left foot: Secondary | ICD-10-CM | POA: Diagnosis not present

## 2018-07-06 DIAGNOSIS — M25671 Stiffness of right ankle, not elsewhere classified: Secondary | ICD-10-CM | POA: Diagnosis not present

## 2018-07-06 DIAGNOSIS — M25672 Stiffness of left ankle, not elsewhere classified: Secondary | ICD-10-CM | POA: Diagnosis not present

## 2018-07-06 DIAGNOSIS — B351 Tinea unguium: Secondary | ICD-10-CM | POA: Diagnosis not present

## 2018-07-06 DIAGNOSIS — M25571 Pain in right ankle and joints of right foot: Secondary | ICD-10-CM | POA: Diagnosis not present

## 2018-07-06 DIAGNOSIS — M25572 Pain in left ankle and joints of left foot: Secondary | ICD-10-CM | POA: Diagnosis not present

## 2018-07-10 DIAGNOSIS — M25672 Stiffness of left ankle, not elsewhere classified: Secondary | ICD-10-CM | POA: Diagnosis not present

## 2018-07-10 DIAGNOSIS — M25571 Pain in right ankle and joints of right foot: Secondary | ICD-10-CM | POA: Diagnosis not present

## 2018-07-10 DIAGNOSIS — M25572 Pain in left ankle and joints of left foot: Secondary | ICD-10-CM | POA: Diagnosis not present

## 2018-07-10 DIAGNOSIS — M25671 Stiffness of right ankle, not elsewhere classified: Secondary | ICD-10-CM | POA: Diagnosis not present

## 2018-07-13 DIAGNOSIS — F250 Schizoaffective disorder, bipolar type: Secondary | ICD-10-CM | POA: Diagnosis not present

## 2018-07-17 DIAGNOSIS — M25671 Stiffness of right ankle, not elsewhere classified: Secondary | ICD-10-CM | POA: Diagnosis not present

## 2018-07-17 DIAGNOSIS — M25571 Pain in right ankle and joints of right foot: Secondary | ICD-10-CM | POA: Diagnosis not present

## 2018-07-17 DIAGNOSIS — M25672 Stiffness of left ankle, not elsewhere classified: Secondary | ICD-10-CM | POA: Diagnosis not present

## 2018-07-17 DIAGNOSIS — M25572 Pain in left ankle and joints of left foot: Secondary | ICD-10-CM | POA: Diagnosis not present

## 2018-07-18 DIAGNOSIS — G90523 Complex regional pain syndrome I of lower limb, bilateral: Secondary | ICD-10-CM | POA: Diagnosis not present

## 2018-07-18 DIAGNOSIS — M545 Low back pain: Secondary | ICD-10-CM | POA: Diagnosis not present

## 2018-07-25 DIAGNOSIS — R030 Elevated blood-pressure reading, without diagnosis of hypertension: Secondary | ICD-10-CM | POA: Diagnosis not present

## 2018-07-25 DIAGNOSIS — G90523 Complex regional pain syndrome I of lower limb, bilateral: Secondary | ICD-10-CM | POA: Diagnosis not present

## 2018-07-25 DIAGNOSIS — G5790 Unspecified mononeuropathy of unspecified lower limb: Secondary | ICD-10-CM | POA: Diagnosis not present

## 2018-07-25 DIAGNOSIS — Z6826 Body mass index (BMI) 26.0-26.9, adult: Secondary | ICD-10-CM | POA: Diagnosis not present

## 2018-07-26 DIAGNOSIS — L718 Other rosacea: Secondary | ICD-10-CM | POA: Diagnosis not present

## 2018-07-26 DIAGNOSIS — L820 Inflamed seborrheic keratosis: Secondary | ICD-10-CM | POA: Diagnosis not present

## 2018-08-04 ENCOUNTER — Encounter (HOSPITAL_COMMUNITY): Payer: Self-pay

## 2018-08-04 ENCOUNTER — Emergency Department (HOSPITAL_COMMUNITY)
Admission: EM | Admit: 2018-08-04 | Discharge: 2018-08-04 | Disposition: A | Discharge status: Home / Self Care | Payer: Medicare Other | Attending: Emergency Medicine | Admitting: Emergency Medicine

## 2018-08-04 DIAGNOSIS — R42 Dizziness and giddiness: Secondary | ICD-10-CM

## 2018-08-04 DIAGNOSIS — Z79899 Other long term (current) drug therapy: Secondary | ICD-10-CM | POA: Diagnosis not present

## 2018-08-04 DIAGNOSIS — R112 Nausea with vomiting, unspecified: Secondary | ICD-10-CM | POA: Diagnosis not present

## 2018-08-04 DIAGNOSIS — F17210 Nicotine dependence, cigarettes, uncomplicated: Secondary | ICD-10-CM | POA: Diagnosis not present

## 2018-08-04 DIAGNOSIS — G43909 Migraine, unspecified, not intractable, without status migrainosus: Secondary | ICD-10-CM | POA: Diagnosis not present

## 2018-08-04 DIAGNOSIS — I10 Essential (primary) hypertension: Secondary | ICD-10-CM | POA: Diagnosis not present

## 2018-08-04 LAB — COMPREHENSIVE METABOLIC PANEL
ALT: 15 U/L (ref 0–44)
ANION GAP: 7 (ref 5–15)
AST: 16 U/L (ref 15–41)
Albumin: 3.8 g/dL (ref 3.5–5.0)
Alkaline Phosphatase: 49 U/L (ref 38–126)
BUN: 10 mg/dL (ref 8–23)
CHLORIDE: 112 mmol/L — AB (ref 98–111)
CO2: 27 mmol/L (ref 22–32)
Calcium: 9.1 mg/dL (ref 8.9–10.3)
Creatinine, Ser: 0.82 mg/dL (ref 0.44–1.00)
GFR calc non Af Amer: >60 mL/min (ref >60)
Glucose, Bld: 108 mg/dL — ABNORMAL HIGH (ref 70–99)
POTASSIUM: 4 mmol/L (ref 3.5–5.1)
SODIUM: 146 mmol/L — AB (ref 135–145)
Total Bilirubin: 0.8 mg/dL (ref 0.3–1.2)
Total Protein: 6.2 g/dL — ABNORMAL LOW (ref 6.5–8.1)

## 2018-08-04 LAB — CBC WITH DIFFERENTIAL/PLATELET
Basophils Absolute: 0 10*3/uL (ref 0.0–0.1)
Basophils Relative: 0 %
Eosinophils Absolute: 0 10*3/uL (ref 0.0–0.7)
Eosinophils Relative: 0 %
HCT: 45 % (ref 36.0–46.0)
Hemoglobin: 15.3 g/dL — ABNORMAL HIGH (ref 12.0–15.0)
Lymphocytes Relative: 14 %
Lymphs Abs: 1.7 10*3/uL (ref 0.7–4.0)
MCH: 30.9 pg (ref 26.0–34.0)
MCHC: 34 g/dL (ref 30.0–36.0)
MCV: 90.9 fL (ref 78.0–100.0)
Monocytes Absolute: 0.3 10*3/uL (ref 0.1–1.0)
Monocytes Relative: 2 %
Neutro Abs: 10 10*3/uL — ABNORMAL HIGH (ref 1.7–7.7)
Neutrophils Relative %: 84 %
Platelets: 291 10*3/uL (ref 150–400)
RBC: 4.95 MIL/uL (ref 3.87–5.11)
RDW: 12.7 % (ref 11.5–15.5)
WBC: 12 10*3/uL — ABNORMAL HIGH (ref 4.0–10.5)

## 2018-08-04 LAB — URINALYSIS, ROUTINE W REFLEX MICROSCOPIC
Bilirubin Urine: NEGATIVE
Glucose, UA: 50 mg/dL — AB
Hgb urine dipstick: NEGATIVE
Ketones, ur: 5 mg/dL — AB
Leukocytes, UA: NEGATIVE
Nitrite: NEGATIVE
Protein, ur: NEGATIVE mg/dL
Specific Gravity, Urine: 1.011 (ref 1.005–1.030)
pH: 8 (ref 5.0–8.0)

## 2018-08-04 MED ORDER — KETOROLAC TROMETHAMINE 30 MG/ML IJ SOLN
15.0000 mg | Freq: Once | INTRAMUSCULAR | Status: AC
  Administered 2018-08-04: 15 mg via INTRAVENOUS
  Filled 2018-08-04: qty 1

## 2018-08-04 MED ORDER — PROCHLORPERAZINE EDISYLATE 10 MG/2ML IJ SOLN
10.0000 mg | Freq: Once | INTRAMUSCULAR | Status: AC
  Administered 2018-08-04: 10 mg via INTRAVENOUS
  Filled 2018-08-04: qty 2

## 2018-08-04 MED ORDER — PROMETHAZINE HCL 25 MG/ML IJ SOLN
12.5000 mg | Freq: Once | INTRAMUSCULAR | Status: AC
  Administered 2018-08-04: 12.5 mg via INTRAVENOUS
  Filled 2018-08-04: qty 1

## 2018-08-04 MED ORDER — SODIUM CHLORIDE 0.9 % IV BOLUS
1000.0000 mL | Freq: Once | INTRAVENOUS | Status: AC
  Administered 2018-08-04: 1000 mL via INTRAVENOUS

## 2018-08-04 MED ORDER — PROMETHAZINE HCL 25 MG PO TABS: 25.0000 mg | ORAL_TABLET | Freq: Three times a day (TID) | ORAL | 0 refills | Status: DC | PRN

## 2018-08-04 NOTE — ED Triage Notes (Signed)
Pt complains of vertigo and a migraine Pt received 8mg  zofran per EMS

## 2018-08-04 NOTE — ED Notes (Signed)
Patient sleeping soundly. Daughter at the bedside.

## 2018-08-04 NOTE — Discharge Instructions (Addendum)
Return here as needed.  Follow-up with your doctor for a recheck.  Increase your fluid intake and rest as much as possible. °

## 2018-08-09 NOTE — ED Provider Notes (Signed)
[facility] DEPT Provider Note   CSN: [id number] Arrival date & time: 08/04/18  0258     History   Chief Complaint Chief Complaint  Patient presents with  . Dizziness  . Migraine    HPI Nichole Delgado is a 67 y.o. female.  HPI Patient presents to the emergency department with a typical migraine for her that started yesterday.  The patient normally has vertigo that has been deemed by her neurologist as her migraine variant.  Patient states this happens a couple times a year and she is able to eat usually staving off at home.  The patient states she has minimal pain but does have some light sensitivity along with inability to move her head.  The patient denies chest pain, shortness of breath, blurred vision, neck pain, fever, cough, weakness, numbness, dizziness, anorexia, edema, abdominal pain,  diarrhea, rash, back pain, dysuria, hematemesis, bloody stool, near syncope, or syncope. Past Medical History:  Diagnosis Date  . Anxiety   . Cervical spondylosis   . Common migraine with intractable migraine 12/29/2017  . HLD (hyperlipidemia)   . IBS (irritable bowel syndrome)   . Migraines   . Tobacco abuse   . Vertigo 07/07/2016    Patient Active Problem List   Diagnosis Date Noted  . Common migraine with intractable migraine 12/29/2017  . Conductive hearing loss of both ears 08/01/2017  . Ankle pain   . Bilateral headaches   . DDD (degenerative disc disease), lumbosacral   . Left foot drop   . Hypoalbuminemia due to protein-calorie malnutrition ([place])   . Leukocytosis   . Fall 03/26/2017  . Right calcaneal fracture 03/26/2017  . GERD (gastroesophageal reflux disease) 03/26/2017  . HLD (hyperlipidemia)   . Tobacco abuse   . Closed fracture of both ankles   . IBS (irritable bowel syndrome)   . Vertigo 07/07/2016  . Eustachian tube dysfunction, bilateral 05/04/2016  . Symptomatic menopausal or female climacteric states 02/24/2016  .  Osteoporosis 02/24/2016  . Osteoarthritis 02/24/2016  . Adjustment insomnia 02/24/2016    Past Surgical History:  Procedure Laterality Date  . CATARACT EXTRACTION Bilateral   . COLONOSCOPY W/ POLYPECTOMY    . FOOT SURGERY Right 2011   hammer toe  . ORIF ANKLE FRACTURE Left 03/27/2017   Procedure: OPEN REDUCTION INTERNAL FIXATION (ORIF) BIMALLEOLAR ANKLE FRACTURE;  Surgeon: Renette Butters, MD;  Location: [facility];  Service: Orthopedics;  Laterality: Left;  . ORIF CALCANEOUS FRACTURE Right 04/05/2017   Procedure: OPEN REDUCTION INTERNAL FIXATION (ORIF) CALCANEOUS AND FIBULA FRACTURE;  Surgeon: Renette Butters, MD;  Location: [facility];  Service: Orthopedics;  Laterality: Right;  . PARTIAL HYSTERECTOMY  1988     OB History   None      Home Medications    Prior to Admission medications   Medication Sig Start Date End Date Taking? Authorizing Provider  albuterol (VENTOLIN HFA) 108 (90 Base) MCG/ACT inhaler Inhale 2 puffs into the lungs every 6 (six) hours as needed for wheezing or shortness of breath.   Yes [provider]  Ca Phosphate-Cholecalciferol (CALCIUM 500 + D3) 250-500 MG-UNIT CHEW Chew 1 each by mouth 2 (two) times daily.   Yes [provider]  Eluxadoline (VIBERZI) 75 MG TABS Take 1 tablet by mouth 2 (two) times daily.    Yes [provider]  gabapentin (NEURONTIN) 100 MG capsule Take 100 mg by mouth every 4 (four) hours.    Yes [provider]  ibuprofen (ADVIL,MOTRIN) 200  MG tablet Take 400 mg by mouth every 6 (six) hours as needed for mild pain.   Yes [provider]  metroNIDAZOLE (METROGEL) 0.75 % gel Apply 1 application topically 2 (two) times daily as needed (rosacea). Apply to face 05/03/18  Yes [provider]  NONFORMULARY OR COMPOUNDED [facility]:  Neuropathy Cream - bupivacaine 1%, doxepin 3%, gabapentin 6%, pentoxifylline 3%, topiramate 1%, apply 1-2 grams to affected area 3-4 times daily. Patient taking  differently: Apply 2 g topically 2 (two) times daily as needed (neuropathy pain). [facility]:  Neuropathy Cream - bupivacaine 1%, doxepin 3%, gabapentin 6%, pentoxifylline 3%, topiramate 1%, apply 1-2 grams to affected area 3-4 times daily. 03/31/18  Yes Trula Slade, DPM  omeprazole (PRILOSEC) 40 MG capsule Take 40 mg by mouth daily as needed (indigestion).  05/19/16  Yes [provider]  perphenazine (TRILAFON) 2 MG tablet Take 2 mg by mouth at bedtime. 07/13/18  Yes [provider]  pravastatin (PRAVACHOL) 40 MG tablet Take 40 mg by mouth at bedtime.  06/15/16  Yes [provider]  rizatriptan (MAXALT-MLT) 10 MG disintegrating tablet Take 1 tablet (10 mg total) by mouth 3 (three) times daily as needed for migraine. 12/29/17  Yes Kathrynn Ducking, MD  traMADol (ULTRAM) 50 MG tablet Take 50 mg by mouth 2 (two) times daily as needed for pain. 07/27/18  Yes [provider]  zolpidem (AMBIEN) 10 MG tablet Take 10 mg by mouth at bedtime as needed for sleep.   Yes [provider]  promethazine (PHENERGAN) 25 MG tablet Take 1 tablet (25 mg total) by mouth every 8 (eight) hours as needed for nausea or vomiting. 08/04/18   Dalia Heading, PA-C    Family History Family History  Problem Relation Age of Onset  . Heart attack Father   . Migraines Mother   . Cancer Sister        Pancreatic  . Cancer Brother        Liver  . Migraines Daughter   . Breast cancer Neg Hx     Social History Social History   Tobacco Use  . Smoking status: Current Every Day Smoker    Packs/day: 1.00    Years: 35.00    Pack years: 35.00    Types: Cigarettes  . Smokeless tobacco: Never Used  . Tobacco comment: started smoking when she was 67 years old  Substance Use Topics  . Alcohol use: No    Alcohol/week: 0.0 standard drinks    Comment: 1 drink every few months  . Drug use: No     Allergies   Codeine; Penicillins; and Sulfa antibiotics   Review of  Systems Review of Systems  All other systems negative except as documented in the HPI. All pertinent positives and negatives as reviewed in the HPI.   Physical Exam Updated Vital Signs BP (!) 144/73   Pulse 70   Temp 98 F (36.7 C) (Oral)   Resp 15   SpO2 96%   Physical Exam  Constitutional: She is oriented to person, place, and time. She appears well-developed and well-nourished. No distress.  HENT:  Head: Normocephalic and atraumatic.  Mouth/Throat: Oropharynx is clear and moist.  Eyes: Pupils are equal, round, and reactive to light.  Neck: Normal range of motion. Neck supple.  Cardiovascular: Normal rate, regular rhythm and normal heart sounds. Exam reveals no gallop and no friction rub.  No murmur heard. Pulmonary/Chest: Effort normal and breath sounds normal. No respiratory distress.  She has no wheezes.  Abdominal: Soft. Bowel sounds are normal. She exhibits no distension. There is no tenderness.  Neurological: She is alert and oriented to person, place, and time. She exhibits normal muscle tone. Coordination normal.  Skin: Skin is warm and dry. Capillary refill takes less than 2 seconds. No rash noted. No erythema.  Psychiatric: She has a normal mood and affect. Her behavior is normal.  Nursing note and vitals reviewed.    ED Treatments / Results  Labs (all labs ordered are listed, but only abnormal results are displayed) Labs Reviewed  CBC WITH DIFFERENTIAL/PLATELET - Abnormal; Notable for the following components:      Result Value   WBC 12.0 (*)    Hemoglobin 15.3 (*)    Neutro Abs 10.0 (*)    All other components within normal limits  URINALYSIS, ROUTINE W REFLEX MICROSCOPIC - Abnormal; Notable for the following components:   Glucose, UA 50 (*)    Ketones, ur 5 (*)    All other components within normal limits  COMPREHENSIVE METABOLIC PANEL - Abnormal; Notable for the following components:   Sodium 146 (*)    Chloride 112 (*)    Glucose, Bld 108 (*)    Total  Protein 6.2 (*)    All other components within normal limits    EKG None  Radiology No results found.  Procedures Procedures (including critical care time)  Medications Ordered in ED Medications  sodium chloride 0.9 % bolus 1,000 mL (0 mLs Intravenous Stopped 08/04/18 0826)  promethazine (PHENERGAN) injection 12.5 mg (12.5 mg Intravenous Given 08/04/18 0649)  prochlorperazine (COMPAZINE) injection 10 mg (10 mg Intravenous Given 08/04/18 1121)  ketorolac (TORADOL) 30 MG/ML injection 15 mg (15 mg Intravenous Given 08/04/18 1121)     Initial Impression / Assessment and Plan / ED Course  I have reviewed the triage vital signs and the nursing notes.  Pertinent labs & imaging results that were available during my care of the patient were reviewed by me and considered in my medical decision making (see chart for details).     Patient is feeling complete resolution of her symptoms she is able to ambulate and sit up without any difficulties.  I will have her follow-up with her neurologist told return here as needed.  Final Clinical Impressions(s) / ED Diagnoses   Final diagnoses:  Migraine without status migrainosus, not intractable, unspecified migraine type  Vertigo    ED Discharge Orders         Ordered    promethazine (PHENERGAN) 25 MG tablet  Every 8 hours PRN     08/04/18 1307           Dalia Heading, PA-C 08/09/18 0122    Varney Biles, MD 08/10/18 [phone]

## 2018-08-11 ENCOUNTER — Telehealth: Payer: Self-pay | Admitting: Neurology

## 2018-08-11 MED ORDER — PROMETHAZINE HCL 25 MG PO TABS: 25.0000 mg | ORAL_TABLET | Freq: Three times a day (TID) | ORAL | 2 refills | Status: DC | PRN

## 2018-08-11 NOTE — Telephone Encounter (Signed)
I called and left a message, I will send in a prescription for the Phenergan.

## 2018-08-11 NOTE — Telephone Encounter (Signed)
Pt had migraine on 8/16 with vertigo and vomting. Pt's husband called EMS, she was taken to ED and given migraine cocktail. Pt was given rx for promethazine (PHENERGAN) 25 MG tablet (pharmacy [facility] [employer]) and is wanting to know if Dr Viona Gilmore will prescribe this for her. Please call to advise, she is aware the clinic closes at noon today. Also she is having spinal stimulator implanted on 8/30 by Dr Catha Nottingham

## 2018-08-15 DIAGNOSIS — Z9689 Presence of other specified functional implants: Secondary | ICD-10-CM

## 2018-08-15 HISTORY — PX: SPINAL CORD STIMULATOR INSERTION: SHX5378

## 2018-08-15 HISTORY — DX: Presence of other specified functional implants: Z96.89

## 2018-08-18 DIAGNOSIS — G90523 Complex regional pain syndrome I of lower limb, bilateral: Secondary | ICD-10-CM | POA: Diagnosis not present

## 2018-08-18 DIAGNOSIS — G894 Chronic pain syndrome: Secondary | ICD-10-CM | POA: Diagnosis not present

## 2018-08-31 ENCOUNTER — Telehealth: Payer: Self-pay | Admitting: Neurology

## 2018-08-31 MED ORDER — PROPRANOLOL HCL 20 MG PO TABS: 20.0000 mg | ORAL_TABLET | Freq: Two times a day (BID) | ORAL | 3 refills | Status: DC

## 2018-08-31 NOTE — Telephone Encounter (Signed)
I called the patient.  The patient had increased headaches recently, in the past she has been on Topamax, Lyrica, gabapentin, she cannot take Topamax.  We will give a trial on propranolol.  She will call for any dose adjustments.  She recently had visual aura with her migraine.

## 2018-08-31 NOTE — Telephone Encounter (Signed)
Pt is continuing to have migraines, on 9/10 she saw large red spots prior to going to sleep that night. This is the 1st time she's had that symptom. Pt is wanting to discuss if there is something she can take to help prevent them. Please call to discuss

## 2018-09-07 DIAGNOSIS — R911 Solitary pulmonary nodule: Secondary | ICD-10-CM | POA: Diagnosis not present

## 2018-09-07 DIAGNOSIS — E7800 Pure hypercholesterolemia, unspecified: Secondary | ICD-10-CM | POA: Diagnosis not present

## 2018-09-07 DIAGNOSIS — R030 Elevated blood-pressure reading, without diagnosis of hypertension: Secondary | ICD-10-CM | POA: Diagnosis not present

## 2018-09-07 DIAGNOSIS — F17200 Nicotine dependence, unspecified, uncomplicated: Secondary | ICD-10-CM | POA: Diagnosis not present

## 2018-09-07 DIAGNOSIS — M810 Age-related osteoporosis without current pathological fracture: Secondary | ICD-10-CM | POA: Diagnosis not present

## 2018-09-07 DIAGNOSIS — F99 Mental disorder, not otherwise specified: Secondary | ICD-10-CM | POA: Diagnosis not present

## 2018-09-07 LAB — BASIC METABOLIC PANEL
BUN: 15 (ref 4–21)
BUN: 15 (ref 4–21)
CO2: 30 — AB (ref 13–22)
CO2: 30 — AB (ref 13–22)
Chloride: 104 (ref 99–108)
Chloride: 104 (ref 99–108)
Creatinine: 0.9 (ref 0.5–1.1)
Creatinine: 0.9 (ref 0.5–1.1)
Glucose: 87
Glucose: 87
Potassium: 4.7 (ref 3.4–5.3)
Potassium: 4.7 (ref 3.4–5.3)
Sodium: 141 (ref 137–147)

## 2018-09-07 LAB — VITAMIN D 25 HYDROXY (VIT D DEFICIENCY, FRACTURES): Vit D, 25-Hydroxy: 30

## 2018-09-07 LAB — HEPATIC FUNCTION PANEL
ALT: 16 (ref 7–35)
ALT: 16 (ref 7–35)
AST: 14 (ref 13–35)
AST: 14 (ref 13–35)
Bilirubin, Total: 0.3

## 2018-09-07 LAB — COMPREHENSIVE METABOLIC PANEL
Albumin: 4.3 (ref 3.5–5.0)
Albumin: 4.3 (ref 3.5–5.0)
Calcium: 10.8 — AB (ref 8.7–10.7)
Calcium: 10.8 — AB (ref 8.7–10.7)
GFR calc Af Amer: 71
GFR calc Af Amer: 71
GFR calc non Af Amer: 59
GFR calc non Af Amer: 59

## 2018-09-07 LAB — LIPID PANEL
Cholesterol: 235 — AB (ref 0–200)
HDL: 68 (ref 35–70)
LDL Cholesterol: 124
Triglycerides: 219 — AB (ref 40–160)

## 2018-09-28 DIAGNOSIS — F319 Bipolar disorder, unspecified: Secondary | ICD-10-CM | POA: Diagnosis not present

## 2018-09-28 DIAGNOSIS — Z6826 Body mass index (BMI) 26.0-26.9, adult: Secondary | ICD-10-CM | POA: Diagnosis not present

## 2018-09-28 DIAGNOSIS — R030 Elevated blood-pressure reading, without diagnosis of hypertension: Secondary | ICD-10-CM | POA: Diagnosis not present

## 2018-10-03 ENCOUNTER — Other Ambulatory Visit: Payer: Self-pay | Admitting: Family Medicine

## 2018-10-03 DIAGNOSIS — Z1231 Encounter for screening mammogram for malignant neoplasm of breast: Secondary | ICD-10-CM

## 2018-10-05 DIAGNOSIS — F250 Schizoaffective disorder, bipolar type: Secondary | ICD-10-CM | POA: Diagnosis not present

## 2018-10-10 DIAGNOSIS — Z23 Encounter for immunization: Secondary | ICD-10-CM | POA: Diagnosis not present

## 2018-10-26 DIAGNOSIS — G90523 Complex regional pain syndrome I of lower limb, bilateral: Secondary | ICD-10-CM | POA: Diagnosis not present

## 2018-10-26 DIAGNOSIS — R030 Elevated blood-pressure reading, without diagnosis of hypertension: Secondary | ICD-10-CM | POA: Diagnosis not present

## 2018-10-26 DIAGNOSIS — I10 Essential (primary) hypertension: Secondary | ICD-10-CM | POA: Diagnosis not present

## 2018-10-26 DIAGNOSIS — Z9689 Presence of other specified functional implants: Secondary | ICD-10-CM | POA: Diagnosis not present

## 2018-10-26 DIAGNOSIS — Z6826 Body mass index (BMI) 26.0-26.9, adult: Secondary | ICD-10-CM | POA: Diagnosis not present

## 2018-10-26 DIAGNOSIS — M545 Low back pain: Secondary | ICD-10-CM | POA: Diagnosis not present

## 2018-10-31 DIAGNOSIS — J069 Acute upper respiratory infection, unspecified: Secondary | ICD-10-CM | POA: Diagnosis not present

## 2018-11-09 ENCOUNTER — Ambulatory Visit
Admission: RE | Admit: 2018-11-09 | Discharge: 2018-11-09 | Disposition: A | Discharge status: Home / Self Care | Payer: Medicare Other | Source: Ambulatory Visit | Attending: Family Medicine | Admitting: Family Medicine

## 2018-11-09 DIAGNOSIS — Z1231 Encounter for screening mammogram for malignant neoplasm of breast: Secondary | ICD-10-CM | POA: Diagnosis not present

## 2018-12-18 DIAGNOSIS — M545 Low back pain: Secondary | ICD-10-CM | POA: Diagnosis not present

## 2018-12-23 ENCOUNTER — Other Ambulatory Visit: Payer: Self-pay | Admitting: Neurology

## 2018-12-25 DIAGNOSIS — Z780 Asymptomatic menopausal state: Secondary | ICD-10-CM | POA: Diagnosis not present

## 2019-01-01 ENCOUNTER — Encounter: Payer: Self-pay | Admitting: Adult Health

## 2019-01-01 ENCOUNTER — Ambulatory Visit (INDEPENDENT_AMBULATORY_CARE_PROVIDER_SITE_OTHER): Payer: Medicare Other | Admitting: Adult Health

## 2019-01-01 VITALS — BP 133/79 | HR 65 | Ht <= 58 in | Wt 128.6 lb

## 2019-01-01 DIAGNOSIS — R42 Dizziness and giddiness: Secondary | ICD-10-CM | POA: Diagnosis not present

## 2019-01-01 DIAGNOSIS — G43019 Migraine without aura, intractable, without status migrainosus: Secondary | ICD-10-CM | POA: Diagnosis not present

## 2019-01-01 NOTE — Progress Notes (Signed)
I have read the note, and I agree with the clinical assessment and plan.  Nichole Delgado   

## 2019-01-01 NOTE — Progress Notes (Signed)
PATIENT: Nichole Delgado DOB: 08-28-1951  REASON FOR VISIT: follow up HISTORY FROM: patient  HISTORY OF PRESENT ILLNESS: Today 01/01/19:  [name] is a 68 year old female with a history of migraine headaches associated with vertigo.  She returns today for follow-up.  She states that since she started propranolol she has noticed benefit.  She states that she has not had a headache/vertigo since August.  She states that when she does have an event it typically starts with vertigo -she never has a true head pain.  She has had vomiting light and noise sensitivity but denies visual disturbances.  She states that taking Maxalt offers her no benefit.  She states that typically she has to lay down for 3 to 4 hours and it will resolve.  She has approximately one severe migraine a month otherwise she may have one mild to moderate episode every 2 weeks.  She states that since she had a spinal cord stimulator placed she has had 3 episodes where she was seeing red spots in her vision.  Reports that she typically can just go to sleep and they resolve.  Her last episode was in October.  She sees her ophthalmologist at the end of the month.  She returns today for evaluation.  HISTORY [name] is a 68 year old right-handed white female with a history of migraine headaches associated with vertigo.  The patient has done quite well with her headaches, she has had only one headache since last seen, this occurred around Thanksgiving of 2018.  The patient could not tolerate Topamax or Trokendi.  She has not been taking any daily medications for her migraine.  She may use a Maxalt tablet on occasion.  Unfortunately in April 2018 the patient was up in an attic and fell through the attic floor to the next level fracturing both ankles requiring surgery.  The patient has had chronic pain in the feet since that time is present at all times with weightbearing and with resting.  The patient is followed through a pain  center, she is being considered for a spinal cord stimulator.  The patient claims that she has been on several medications to include Lyrica, Cymbalta, and another medication he cannot remember the name of.  The patient is on gabapentin taking 100 mg capsules every 4 hours while awake and 2 capsules at night.  She returns for an evaluation.   REVIEW OF SYSTEMS: Out of a complete 14 system review of symptoms, the patient complains only of the following symptoms, and all other reviewed systems are negative.  Abdominal pain, diarrhea nausea, joint pain, joint swelling, back pain, walking difficulty neck pain, dizziness, runny nose  ALLERGIES: Allergies  Allergen Reactions  . Codeine Nausea Only  . Penicillins Hives    Has patient had a PCN reaction causing immediate rash, facial/tongue/throat swelling, SOB or lightheadedness with hypotension: No Has patient had a PCN reaction causing severe rash involving mucus membranes or skin necrosis: Yes Has patient had a PCN reaction that required hospitalization No Has patient had a PCN reaction occurring within the last 10 years: No If all of the above answers are "NO", then may proceed with Cephalosporin use.   Marland Kitchen Keppra [Levetiracetam] Rash  . Sulfa Antibiotics Hives and Rash    HOME MEDICATIONS: Outpatient Medications Prior to Visit  Medication Sig Dispense Refill  . albuterol (VENTOLIN HFA) 108 (90 Base) MCG/ACT inhaler Inhale 2 puffs into the lungs every 6 (six) hours as needed for wheezing or shortness  of breath.    . Ca Phosphate-Cholecalciferol (CALCIUM 500 + D3) 250-500 MG-UNIT CHEW Chew 1 each by mouth 2 (two) times daily.    . Eluxadoline (VIBERZI) 75 MG TABS Take 1 tablet by mouth 2 (two) times daily.     Marland Kitchen gabapentin (NEURONTIN) 100 MG capsule Take 100 mg by mouth every 4 (four) hours.     Marland Kitchen ibuprofen (ADVIL,MOTRIN) 200 MG tablet Take 400 mg by mouth every 6 (six) hours as needed for mild pain.    . metroNIDAZOLE (METROGEL) 0.75 % gel  Apply 1 application topically 2 (two) times daily as needed (rosacea). Apply to face  3  . NONFORMULARY OR COMPOUNDED [facility]:  Neuropathy Cream - bupivacaine 1%, doxepin 3%, gabapentin 6%, pentoxifylline 3%, topiramate 1%, apply 1-2 grams to affected area 3-4 times daily. (Patient taking differently: Apply 2 g topically 2 (two) times daily as needed (neuropathy pain). [facility]:  Neuropathy Cream - bupivacaine 1%, doxepin 3%, gabapentin 6%, pentoxifylline 3%, topiramate 1%, apply 1-2 grams to affected area 3-4 times daily.) 120 each 2  . omeprazole (PRILOSEC) 40 MG capsule Take 40 mg by mouth daily as needed (indigestion).     Marland Kitchen perphenazine (TRILAFON) 2 MG tablet Take 2 mg by mouth at bedtime.  4  . pravastatin (PRAVACHOL) 40 MG tablet Take 40 mg by mouth at bedtime.     . promethazine (PHENERGAN) 25 MG tablet Take 1 tablet (25 mg total) by mouth every 8 (eight) hours as needed for nausea or vomiting. 30 tablet 2  . propranolol (INDERAL) 20 MG tablet TAKE 1 TABLET BY MOUTH TWICE A DAY 180 tablet 1  . rizatriptan (MAXALT-MLT) 10 MG disintegrating tablet Take 1 tablet (10 mg total) by mouth 3 (three) times daily as needed for migraine. 9 tablet 3  . traMADol (ULTRAM) 50 MG tablet Take 50 mg by mouth 2 (two) times daily as needed for pain.  1  . zolpidem (AMBIEN) 10 MG tablet Take 10 mg by mouth at bedtime as needed for sleep.     No facility-administered medications prior to visit.     PAST MEDICAL HISTORY: Past Medical History:  Diagnosis Date  . Anxiety   . Cervical spondylosis   . Common migraine with intractable migraine 12/29/2017  . HLD (hyperlipidemia)   . IBS (irritable bowel syndrome)   . Migraines   . Tobacco abuse   . Vertigo 07/07/2016    PAST SURGICAL HISTORY: Past Surgical History:  Procedure Laterality Date  . CATARACT EXTRACTION Bilateral   . COLONOSCOPY W/ POLYPECTOMY    . FOOT SURGERY Right 2011   hammer toe  . ORIF ANKLE FRACTURE Left 03/27/2017    Procedure: OPEN REDUCTION INTERNAL FIXATION (ORIF) BIMALLEOLAR ANKLE FRACTURE;  Surgeon: Renette Butters, MD;  Location: [facility];  Service: Orthopedics;  Laterality: Left;  . ORIF CALCANEOUS FRACTURE Right 04/05/2017   Procedure: OPEN REDUCTION INTERNAL FIXATION (ORIF) CALCANEOUS AND FIBULA FRACTURE;  Surgeon: Renette Butters, MD;  Location: [facility];  Service: Orthopedics;  Laterality: Right;  . PARTIAL HYSTERECTOMY  1988    FAMILY HISTORY: Family History  Problem Relation Age of Onset  . Heart attack Father   . Migraines Mother   . Cancer Sister        Pancreatic  . Cancer Brother        Liver  . Migraines Daughter   . Breast cancer Neg Hx     SOCIAL HISTORY: Social History   Socioeconomic History  .  Marital status: Married    Spouse name: Not on file  . Number of children: 1  . Years of education: 58  . Highest education level: Not on file  Occupational History  . Occupation: N/A  Social Needs  . Financial resource strain: Not on file  . Food insecurity:    Worry: Not on file    Inability: Not on file  . Transportation needs:    Medical: Not on file    Non-medical: Not on file  Tobacco Use  . Smoking status: Current Every Day Smoker    Packs/day: 1.00    Years: 35.00    Pack years: 35.00    Types: Cigarettes  . Smokeless tobacco: Never Used  . Tobacco comment: started smoking when she was 68 years old  Substance and Sexual Activity  . Alcohol use: No    Alcohol/week: 0.0 standard drinks    Comment: 1 drink every few months  . Drug use: No  . Sexual activity: Not on file  Lifestyle  . Physical activity:    Days per week: Not on file    Minutes per session: Not on file  . Stress: Not on file  Relationships  . Social connections:    Talks on phone: Not on file    Gets together: Not on file    Attends religious service: Not on file    Active member of club or organization: Not on file    Attends meetings of clubs or organizations: Not on file     Relationship status: Not on file  . Intimate partner violence:    Fear of current or ex partner: Not on file    Emotionally abused: Not on file    Physically abused: Not on file    Forced sexual activity: Not on file  Other Topics Concern  . Not on file  Social History Narrative   Lives at home with her husband   Right-handed   Drinks about 5 cups of coffee per day      PHYSICAL EXAM  Vitals:   01/01/19 1436  BP: 133/79  Pulse: 65  Weight: 128 lb 9.6 oz (58.3 kg)  Height: 4\' 10"  (1.473 m)   Body mass index is 26.88 kg/m.  Generalized: Well developed, in no acute distress   Neurological examination  Mentation: Alert oriented to time, place, history taking. Follows all commands speech and language fluent Cranial nerve II-XII: Pupils were equal round reactive to light. Extraocular movements were full, visual field were full on confrontational test. Facial sensation and strength were normal. Uvula tongue midline. Head turning and shoulder shrug  were normal and symmetric. Motor: The motor testing reveals 5 over 5 strength of all 4 extremities. Good symmetric motor tone is noted throughout.  Sensory: Sensory testing is intact to soft touch on all 4 extremities. No evidence of extinction is noted.  Coordination: Cerebellar testing reveals good finger-nose-finger and heel-to-shin bilaterally.  Gait and station: Gait is normal. Tandem gait not attempted. Romberg is negative. No drift is seen.  Reflexes: Deep tendon reflexes are symmetric and normal bilaterally.   DIAGNOSTIC DATA (LABS, IMAGING, TESTING) - I reviewed patient records, labs, notes, testing and imaging myself where available.  Lab Results  Component Value Date   WBC 12.0 (H) 08/04/2018   HGB 15.3 (H) 08/04/2018   HCT 45.0 08/04/2018   MCV 90.9 08/04/2018   PLT 291 08/04/2018      Component Value Date/Time   NA 146 (H) 08/04/2018 1130  NA 140 07/07/2016 1016   K 4.0 08/04/2018 1130   CL 112 (H) 08/04/2018  1130   CO2 27 08/04/2018 1130   GLUCOSE 108 (H) 08/04/2018 1130   BUN 10 08/04/2018 1130   BUN 17 07/07/2016 1016   CREATININE 0.82 08/04/2018 1130   CALCIUM 9.1 08/04/2018 1130   PROT 6.2 (L) 08/04/2018 1130   PROT 6.5 07/07/2016 1016   ALBUMIN 3.8 08/04/2018 1130   ALBUMIN 4.5 07/07/2016 1016   AST 16 08/04/2018 1130   ALT 15 08/04/2018 1130   ALKPHOS 49 08/04/2018 1130   BILITOT 0.8 08/04/2018 1130   BILITOT 0.3 07/07/2016 1016   GFRNONAA >60 08/04/2018 1130   GFRAA >60 08/04/2018 1130   No results found for: CHOL, HDL, LDLCALC, LDLDIRECT, TRIG, CHOLHDL No results found for: HGBA1C No results found for: VITAMINB12 No results found for: TSH    ASSESSMENT AND PLAN 68 y.o. year old female  has a past medical history of Anxiety, Cervical spondylosis, Common migraine with intractable migraine (12/29/2017), HLD (hyperlipidemia), IBS (irritable bowel syndrome), Migraines, Tobacco abuse, and Vertigo (07/07/2016). here with :  1.  Migraine headaches 2.  Vertigo  The patient has atypical migraines at present primarily as vertigo.  Propranolol has offered her benefit.  She will continue this medication.  She is advised to stop Maxalt.  She can continue Phenergan for nausea.  She sees her ophthalmologist at the end of the month.  If she has any visual disturbances such as seeing the red spots she should let us know.  She will follow-up in 1 year or sooner if needed.    Ward Givens, MSN, NP-C 01/01/2019, 2:32 PM [facility] [address], [place], [place] [address] [phone]

## 2019-01-01 NOTE — Patient Instructions (Signed)
Your Plan:  Continue Propranolol  Stop Maxalt Continue Phenergan for nausea if needed If your symptoms worsen or you develop new symptoms please let us know.   Thank you for coming to see [place] at [facility]. I hope we have been able to provide you high quality care today.  You may receive a patient satisfaction survey over the next few weeks. We would appreciate your feedback and comments so that we may continue to improve ourselves and the health of our patients.

## 2019-01-03 DIAGNOSIS — L708 Other acne: Secondary | ICD-10-CM | POA: Diagnosis not present

## 2019-01-03 DIAGNOSIS — L820 Inflamed seborrheic keratosis: Secondary | ICD-10-CM | POA: Diagnosis not present

## 2019-01-03 DIAGNOSIS — X32XXXD Exposure to sunlight, subsequent encounter: Secondary | ICD-10-CM | POA: Diagnosis not present

## 2019-01-03 DIAGNOSIS — L570 Actinic keratosis: Secondary | ICD-10-CM | POA: Diagnosis not present

## 2019-01-03 DIAGNOSIS — C44311 Basal cell carcinoma of skin of nose: Secondary | ICD-10-CM | POA: Diagnosis not present

## 2019-01-18 DIAGNOSIS — H43813 Vitreous degeneration, bilateral: Secondary | ICD-10-CM | POA: Diagnosis not present

## 2019-01-18 DIAGNOSIS — H04123 Dry eye syndrome of bilateral lacrimal glands: Secondary | ICD-10-CM | POA: Diagnosis not present

## 2019-01-18 DIAGNOSIS — H5203 Hypermetropia, bilateral: Secondary | ICD-10-CM | POA: Diagnosis not present

## 2019-01-18 DIAGNOSIS — Z961 Presence of intraocular lens: Secondary | ICD-10-CM | POA: Diagnosis not present

## 2019-01-19 DIAGNOSIS — M25561 Pain in right knee: Secondary | ICD-10-CM | POA: Diagnosis not present

## 2019-02-05 DIAGNOSIS — Z85828 Personal history of other malignant neoplasm of skin: Secondary | ICD-10-CM | POA: Diagnosis not present

## 2019-02-05 DIAGNOSIS — Z08 Encounter for follow-up examination after completed treatment for malignant neoplasm: Secondary | ICD-10-CM | POA: Diagnosis not present

## 2019-02-13 DIAGNOSIS — F250 Schizoaffective disorder, bipolar type: Secondary | ICD-10-CM | POA: Diagnosis not present

## 2019-02-14 DIAGNOSIS — Z6826 Body mass index (BMI) 26.0-26.9, adult: Secondary | ICD-10-CM | POA: Diagnosis not present

## 2019-02-14 DIAGNOSIS — M545 Low back pain: Secondary | ICD-10-CM | POA: Diagnosis not present

## 2019-02-14 DIAGNOSIS — Z9689 Presence of other specified functional implants: Secondary | ICD-10-CM | POA: Diagnosis not present

## 2019-02-14 DIAGNOSIS — R030 Elevated blood-pressure reading, without diagnosis of hypertension: Secondary | ICD-10-CM | POA: Diagnosis not present

## 2019-02-14 DIAGNOSIS — G90523 Complex regional pain syndrome I of lower limb, bilateral: Secondary | ICD-10-CM | POA: Diagnosis not present

## 2019-04-13 DIAGNOSIS — L738 Other specified follicular disorders: Secondary | ICD-10-CM | POA: Diagnosis not present

## 2019-04-13 DIAGNOSIS — L814 Other melanin hyperpigmentation: Secondary | ICD-10-CM | POA: Diagnosis not present

## 2019-04-13 DIAGNOSIS — D229 Melanocytic nevi, unspecified: Secondary | ICD-10-CM | POA: Diagnosis not present

## 2019-04-13 DIAGNOSIS — L821 Other seborrheic keratosis: Secondary | ICD-10-CM | POA: Diagnosis not present

## 2019-04-13 DIAGNOSIS — I788 Other diseases of capillaries: Secondary | ICD-10-CM | POA: Diagnosis not present

## 2019-04-16 DIAGNOSIS — K219 Gastro-esophageal reflux disease without esophagitis: Secondary | ICD-10-CM | POA: Diagnosis not present

## 2019-04-16 DIAGNOSIS — F99 Mental disorder, not otherwise specified: Secondary | ICD-10-CM | POA: Diagnosis not present

## 2019-04-16 DIAGNOSIS — G90523 Complex regional pain syndrome I of lower limb, bilateral: Secondary | ICD-10-CM | POA: Diagnosis not present

## 2019-04-16 DIAGNOSIS — C4491 Basal cell carcinoma of skin, unspecified: Secondary | ICD-10-CM | POA: Diagnosis not present

## 2019-04-16 DIAGNOSIS — M810 Age-related osteoporosis without current pathological fracture: Secondary | ICD-10-CM | POA: Diagnosis not present

## 2019-04-16 DIAGNOSIS — E7800 Pure hypercholesterolemia, unspecified: Secondary | ICD-10-CM | POA: Diagnosis not present

## 2019-04-16 DIAGNOSIS — M549 Dorsalgia, unspecified: Secondary | ICD-10-CM | POA: Diagnosis not present

## 2019-04-25 DIAGNOSIS — F250 Schizoaffective disorder, bipolar type: Secondary | ICD-10-CM | POA: Diagnosis not present

## 2019-04-25 DIAGNOSIS — M25562 Pain in left knee: Secondary | ICD-10-CM | POA: Diagnosis not present

## 2019-04-25 DIAGNOSIS — M25561 Pain in right knee: Secondary | ICD-10-CM | POA: Diagnosis not present

## 2019-05-08 DIAGNOSIS — C44311 Basal cell carcinoma of skin of nose: Secondary | ICD-10-CM | POA: Diagnosis not present

## 2019-05-23 DIAGNOSIS — M25562 Pain in left knee: Secondary | ICD-10-CM | POA: Diagnosis not present

## 2019-05-23 DIAGNOSIS — M25561 Pain in right knee: Secondary | ICD-10-CM | POA: Diagnosis not present

## 2019-06-07 DIAGNOSIS — M810 Age-related osteoporosis without current pathological fracture: Secondary | ICD-10-CM | POA: Diagnosis not present

## 2019-06-07 DIAGNOSIS — K219 Gastro-esophageal reflux disease without esophagitis: Secondary | ICD-10-CM | POA: Diagnosis not present

## 2019-06-07 DIAGNOSIS — G90523 Complex regional pain syndrome I of lower limb, bilateral: Secondary | ICD-10-CM | POA: Diagnosis not present

## 2019-06-07 DIAGNOSIS — M549 Dorsalgia, unspecified: Secondary | ICD-10-CM | POA: Diagnosis not present

## 2019-06-07 DIAGNOSIS — F99 Mental disorder, not otherwise specified: Secondary | ICD-10-CM | POA: Diagnosis not present

## 2019-06-07 DIAGNOSIS — C4491 Basal cell carcinoma of skin, unspecified: Secondary | ICD-10-CM | POA: Diagnosis not present

## 2019-06-07 DIAGNOSIS — E7800 Pure hypercholesterolemia, unspecified: Secondary | ICD-10-CM | POA: Diagnosis not present

## 2019-06-07 LAB — LIPID PANEL
Cholesterol: 202 — AB (ref 0–200)
Cholesterol: 202 — AB (ref 0–200)
HDL: 87 — AB (ref 35–70)
HDL: 87 — AB (ref 35–70)
LDL Cholesterol: 90
LDL Cholesterol: 90
Triglycerides: 124 (ref 40–160)
Triglycerides: 124 (ref 40–160)

## 2019-06-07 LAB — VITAMIN D 25 HYDROXY (VIT D DEFICIENCY, FRACTURES): Vit D, 25-Hydroxy: 24.9

## 2019-06-13 DIAGNOSIS — S82841D Displaced bimalleolar fracture of right lower leg, subsequent encounter for closed fracture with routine healing: Secondary | ICD-10-CM | POA: Diagnosis not present

## 2019-06-25 DIAGNOSIS — S82841D Displaced bimalleolar fracture of right lower leg, subsequent encounter for closed fracture with routine healing: Secondary | ICD-10-CM | POA: Diagnosis not present

## 2019-06-28 DIAGNOSIS — G5790 Unspecified mononeuropathy of unspecified lower limb: Secondary | ICD-10-CM | POA: Diagnosis not present

## 2019-06-28 DIAGNOSIS — G90523 Complex regional pain syndrome I of lower limb, bilateral: Secondary | ICD-10-CM | POA: Diagnosis not present

## 2019-06-28 DIAGNOSIS — R030 Elevated blood-pressure reading, without diagnosis of hypertension: Secondary | ICD-10-CM | POA: Diagnosis not present

## 2019-06-28 DIAGNOSIS — Z6826 Body mass index (BMI) 26.0-26.9, adult: Secondary | ICD-10-CM | POA: Diagnosis not present

## 2019-07-11 DIAGNOSIS — S82841D Displaced bimalleolar fracture of right lower leg, subsequent encounter for closed fracture with routine healing: Secondary | ICD-10-CM | POA: Diagnosis not present

## 2019-07-23 DIAGNOSIS — S82841D Displaced bimalleolar fracture of right lower leg, subsequent encounter for closed fracture with routine healing: Secondary | ICD-10-CM | POA: Diagnosis not present

## 2019-07-24 ENCOUNTER — Emergency Department (HOSPITAL_COMMUNITY)
Admission: EM | Admit: 2019-07-24 | Discharge: 2019-07-24 | Disposition: A | Discharge status: Home / Self Care | Payer: Medicare Other | Attending: Emergency Medicine | Admitting: Emergency Medicine

## 2019-07-24 ENCOUNTER — Emergency Department (HOSPITAL_COMMUNITY): Payer: Medicare Other

## 2019-07-24 ENCOUNTER — Other Ambulatory Visit: Payer: Self-pay

## 2019-07-24 ENCOUNTER — Encounter (HOSPITAL_COMMUNITY): Payer: Self-pay

## 2019-07-24 DIAGNOSIS — T85192A Other mechanical complication of implanted electronic neurostimulator (electrode) of spinal cord, initial encounter: Secondary | ICD-10-CM | POA: Insufficient documentation

## 2019-07-24 DIAGNOSIS — F17210 Nicotine dependence, cigarettes, uncomplicated: Secondary | ICD-10-CM | POA: Insufficient documentation

## 2019-07-24 DIAGNOSIS — M25571 Pain in right ankle and joints of right foot: Secondary | ICD-10-CM | POA: Insufficient documentation

## 2019-07-24 DIAGNOSIS — R5381 Other malaise: Secondary | ICD-10-CM | POA: Diagnosis not present

## 2019-07-24 DIAGNOSIS — M47816 Spondylosis without myelopathy or radiculopathy, lumbar region: Secondary | ICD-10-CM | POA: Diagnosis not present

## 2019-07-24 DIAGNOSIS — M545 Low back pain, unspecified: Secondary | ICD-10-CM

## 2019-07-24 DIAGNOSIS — Y792 Prosthetic and other implants, materials and accessory orthopedic devices associated with adverse incidents: Secondary | ICD-10-CM | POA: Diagnosis not present

## 2019-07-24 DIAGNOSIS — M7989 Other specified soft tissue disorders: Secondary | ICD-10-CM | POA: Diagnosis not present

## 2019-07-24 DIAGNOSIS — T85840A Pain due to nervous system prosthetic devices, implants and grafts, initial encounter: Secondary | ICD-10-CM | POA: Diagnosis present

## 2019-07-24 DIAGNOSIS — R52 Pain, unspecified: Secondary | ICD-10-CM | POA: Diagnosis not present

## 2019-07-24 DIAGNOSIS — T85191A Other mechanical complication of implanted electronic neurostimulator (electrode) of peripheral nerve, initial encounter: Secondary | ICD-10-CM | POA: Diagnosis not present

## 2019-07-24 MED ORDER — OXYCODONE-ACETAMINOPHEN 5-325 MG PO TABS
1.0000 | ORAL_TABLET | Freq: Once | ORAL | Status: AC
  Administered 2019-07-24: 1 via ORAL
  Filled 2019-07-24: qty 1

## 2019-07-24 MED ORDER — OXYCODONE-ACETAMINOPHEN 5-325 MG PO TABS: 1.0000 | ORAL_TABLET | ORAL | 0 refills | Status: DC | PRN

## 2019-07-24 NOTE — ED Triage Notes (Signed)
Pt BIBA from home. Pt c/o pain near her spinal cord stimulator battery site since Saturday.

## 2019-07-24 NOTE — ED Provider Notes (Signed)
[facility] DEPT Provider Note   CSN: [id number] Arrival date & time: 07/24/19  1103    History   Chief Complaint Chief Complaint  Patient presents with  . Back Pain    HPI MEEAH TOTINO is a 68 y.o. female with PMHx DDD, complex regional pain syndrome s/p spinal cord stimulator, cervical spondylosis who presents to the ED today complaining of gradual onset, constant, sharp, pain to her spinal cord stimulator x 3 days. Pt also complaining of pain to her right ankle as well. No known trauma or overuse prior to the pain beginning. Pt endorses she has been taking her husband's 5 mg oxycodone with mild relief. She states she has been unable to walk due to the pain in both her back and ankle. Denies fever, chills, redness around the implant site, drainage, weakness, numbness, urinary retention, urinary or bowel incontinence, saddle anesthesia, or any other associated symptoms.        Past Medical History:  Diagnosis Date  . Anxiety   . Cervical spondylosis   . Common migraine with intractable migraine 12/29/2017  . Complex regional pain syndrome I   . HLD (hyperlipidemia)   . IBS (irritable bowel syndrome)   . Migraines   . Tobacco abuse   . Vertigo 07/07/2016    Patient Active Problem List   Diagnosis Date Noted  . Common migraine with intractable migraine 12/29/2017  . Conductive hearing loss of both ears 08/01/2017  . Ankle pain   . Bilateral headaches   . DDD (degenerative disc disease), lumbosacral   . Left foot drop   . Hypoalbuminemia due to protein-calorie malnutrition ([place])   . Leukocytosis   . Fall 03/26/2017  . Right calcaneal fracture 03/26/2017  . GERD (gastroesophageal reflux disease) 03/26/2017  . HLD (hyperlipidemia)   . Tobacco abuse   . Closed fracture of both ankles   . IBS (irritable bowel syndrome)   . Vertigo 07/07/2016  . Eustachian tube dysfunction, bilateral 05/04/2016  . Symptomatic menopausal or female  climacteric states 02/24/2016  . Osteoporosis 02/24/2016  . Osteoarthritis 02/24/2016  . Adjustment insomnia 02/24/2016    Past Surgical History:  Procedure Laterality Date  . CATARACT EXTRACTION Bilateral   . COLONOSCOPY W/ POLYPECTOMY    . FOOT SURGERY Right 2011   hammer toe  . ORIF ANKLE FRACTURE Left 03/27/2017   Procedure: OPEN REDUCTION INTERNAL FIXATION (ORIF) BIMALLEOLAR ANKLE FRACTURE;  Surgeon: Renette Butters, MD;  Location: [facility];  Service: Orthopedics;  Laterality: Left;  . ORIF CALCANEOUS FRACTURE Right 04/05/2017   Procedure: OPEN REDUCTION INTERNAL FIXATION (ORIF) CALCANEOUS AND FIBULA FRACTURE;  Surgeon: Renette Butters, MD;  Location: [facility];  Service: Orthopedics;  Laterality: Right;  . PARTIAL HYSTERECTOMY  1988  . SPINAL CORD STIMULATOR INSERTION  08/15/2018     OB History   No obstetric history on file.      Home Medications    Prior to Admission medications   Medication Sig Start Date End Date Taking? Authorizing Provider  albuterol (VENTOLIN HFA) 108 (90 Base) MCG/ACT inhaler Inhale 2 puffs into the lungs every 6 (six) hours as needed for wheezing or shortness of breath.    [provider]  Ca Phosphate-Cholecalciferol (CALCIUM 500 + D3) 250-500 MG-UNIT CHEW Chew 1 each by mouth 2 (two) times daily.    [provider]  Eluxadoline (VIBERZI) 75 MG TABS Take 1 tablet by mouth 2 (two) times daily.     [provider]  gabapentin (  NEURONTIN) 100 MG capsule Take 100 mg by mouth every 4 (four) hours.     [provider]  ibuprofen (ADVIL,MOTRIN) 200 MG tablet Take 400 mg by mouth every 6 (six) hours as needed for mild pain.    [provider]  metroNIDAZOLE (METROGEL) 0.75 % gel Apply 1 application topically 2 (two) times daily as needed (rosacea). Apply to face 05/03/18   [provider]  NONFORMULARY OR COMPOUNDED [place]:  Neuropathy Cream - bupivacaine 1%, doxepin 3%, gabapentin 6%,  pentoxifylline 3%, topiramate 1%, apply 1-2 grams to affected area 3-4 times daily. Patient not taking: Reported on 01/01/2019 03/31/18   Trula Slade, DPM  nortriptyline (PAMELOR) 25 MG capsule Take 25 mg by mouth at bedtime.    [provider]  omeprazole (PRILOSEC) 40 MG capsule Take 40 mg by mouth daily as needed (indigestion).  05/19/16   [provider]  oxyCODONE-acetaminophen (PERCOCET/ROXICET) 5-325 MG tablet Take 1 tablet by mouth every 4 (four) hours as needed for severe pain. 07/24/19   Alroy Bailiff, Rickell Wiehe, PA-C  perphenazine (TRILAFON) 2 MG tablet Take 2 mg by mouth at bedtime. 07/13/18   [provider]  pravastatin (PRAVACHOL) 40 MG tablet Take 40 mg by mouth at bedtime.  06/15/16   [provider]  promethazine (PHENERGAN) 25 MG tablet Take 1 tablet (25 mg total) by mouth every 8 (eight) hours as needed for nausea or vomiting. 08/11/18   Kathrynn Ducking, MD  propranolol (INDERAL) 20 MG tablet TAKE 1 TABLET BY MOUTH TWICE A DAY 12/25/18   Kathrynn Ducking, MD  traMADol (ULTRAM) 50 MG tablet Take 50 mg by mouth 2 (two) times daily as needed for pain. 07/27/18   [provider]  zolpidem (AMBIEN) 10 MG tablet Take 10 mg by mouth at bedtime as needed for sleep.    [provider]    Family History Family History  Problem Relation Age of Onset  . Heart attack Father   . Migraines Mother   . Cancer Sister        Pancreatic  . Cancer Brother        Liver  . Migraines Daughter   . Breast cancer Neg Hx     Social History Social History   Tobacco Use  . Smoking status: Current Every Day Smoker    Packs/day: 1.00    Years: 35.00    Pack years: 35.00    Types: Cigarettes  . Smokeless tobacco: Never Used  . Tobacco comment: started smoking when she was 68 years old  Substance Use Topics  . Alcohol use: No    Alcohol/week: 0.0 standard drinks    Comment: 1 drink every few months  . Drug use: No     Allergies   Codeine,  Other, Penicillins, Topamax [topiramate], Trokendi xr [topiramate er], Keppra [levetiracetam], and Sulfa antibiotics   Review of Systems Review of Systems  Constitutional: Negative for chills and fever.  Musculoskeletal: Positive for arthralgias and back pain. Negative for joint swelling.  Skin: Negative for color change.  Neurological: Negative for weakness and numbness.     Physical Exam Updated Vital Signs BP 132/72 (BP Location: Right Arm)   Pulse 74   Temp 98.2 F (36.8 C) (Oral)   Resp 14   Wt 58 kg   SpO2 97%   BMI 26.72 kg/m   Physical Exam Vitals signs and nursing note reviewed.  Constitutional:      Appearance: She is not ill-appearing.  HENT:     Head: Normocephalic and atraumatic.  Eyes:     Conjunctiva/sclera: Conjunctivae normal.  Cardiovascular:     Rate and Rhythm: Normal rate and regular rhythm.  Pulmonary:     Effort: Pulmonary effort is normal.     Breath sounds: Normal breath sounds.  Musculoskeletal:     Comments: No C, T, or L midline spinal tenderness. No paraspinal muscle tenderness. Pt has tenderness directly on and around spinal cord stimulator. No signs of overlying skin changes. Full ROM intact to right hip, knee, and ankle. Strength 5/5 bilaterally. Sensation intact throughout. Pt also has tenderness to lateral malleolus of right ankle. No tenderness to other aspects of ankle or foot. Good distal pulses.   Skin:    General: Skin is warm and dry.     Coloration: Skin is not jaundiced.  Neurological:     Mental Status: She is alert.      ED Treatments / Results  Labs (all labs ordered are listed, but only abnormal results are displayed) Labs Reviewed - No data to display  EKG None  Radiology Dg Lumbar Spine Complete  Result Date: 07/24/2019 CLINICAL DATA:  Pain near spinal cord stimulator battery pack since Saturday EXAM: LUMBAR SPINE - COMPLETE 4+ VIEW COMPARISON:  Lumbar MRI 01/20/2017 FINDINGS: 5 non-rib-bearing lumbar type  vertebral bodies. Minimal discogenic and facet degenerative changes of the lumbar spine. Degenerative grade 1 anterolisthesis L5 on S1. spinal nerve stimulator pack is position within the right gluteal tissues. No wire discontinuity is evident. Stimulator wire tips are positioned at the T10 level. Aortic atherosclerosis is noted. Bowel gas pattern is normal. Remaining soft tissues are unremarkable. IMPRESSION: No acute osseous abnormality. Mild degenerative changes in the spine with grade 1 anterolisthesis L5 on S1. Spinal cord stimulator without wire fracture. Battery pack projects over the right gluteal tissues. Electronically Signed   By: Lovena Le M.D.   On: 07/24/2019 15:04   Dg Ankle Complete Right  Result Date: 07/24/2019 CLINICAL DATA:  Pain with weight-bearing. Prior surgery April 2018. EXAM: RIGHT ANKLE - COMPLETE 3+ VIEW COMPARISON:  Ankle radiographs 03/21/2018 11/09/2017 FINDINGS: Lateral plate and screw construct along the distal fibula lateral plate and screw construct of the calcaneus as well as vertically positioned cannulated screw of the calcaneus. K-wire fixation of the distal first metatarsal. No evidence of hardware failure or complication. No periprosthetic lucency. Degenerative changes are present in the ankle, mid and hindfoot as included though no acute fracture or traumatic malalignment is seen. Minimal soft tissue swelling is noted laterally without sizable ankle effusion or subjacent acute osseous injury. IMPRESSION: 1. Remote postsurgical changes of the foot and ankle without evidence of hardware complication. 2. Minimal swelling of the ankle laterally without visible acute osseous abnormality. Electronically Signed   By: Lovena Le M.D.   On: 07/24/2019 14:59    Procedures Procedures (including critical care time)  Medications Ordered in ED Medications - No data to display   Initial Impression / Assessment and Plan / ED Course  I have reviewed the triage vital signs  and the nursing notes.  Pertinent labs & imaging results that were available during my care of the patient were reviewed by me and considered in my medical decision making (see chart for details).    68 year old female presenting to the ED with complaints of pain near her spinal cord stimulator and right ankle pain x a couple of days. No trauma. No signs of infection. Had stimulator placed  almost a year ago now. She has no midline spinal tenderness or red flag symptoms; very little suspicion for cauda equina today. Pt complaining of inability to walk; she has no focal neuro deficits on exam today. Will obtain xrays of L spine as well as right ankle to ensure stimulator and screws are in place prior to discharge. Pt has tramadol per PMDP and endorses taking her husbands percocet. She will ultimately need to follow up with pain management as they were the ones who put the stimulator in.   Xrays negative today. Will discharge patient home with short course of pain medicine. Pt needs to follow up with pain management. Strict return precautions discussed. She is in agreement with plan at this time and stable for discharge home.       Final Clinical Impressions(s) / ED Diagnoses   Final diagnoses:  Acute right-sided low back pain without sciatica  Spinal cord stimulator dysfunction, initial encounter ([place])  Acute right ankle pain    ED Discharge Orders         Ordered    oxyCODONE-acetaminophen (PERCOCET/ROXICET) 5-325 MG tablet  Every 4 hours PRN     07/24/19 1523           Eustaquio Maize, PA-C 07/24/19 1711    Blanchie Dessert, MD 07/25/19 2346

## 2019-07-24 NOTE — Discharge Instructions (Signed)
You were seen in the ED today for pain near your spinal cord stimulator and right ankle pain Your xrays were reassuring today and nothing was out of place Please follow up with pain management as they are the ones who put your stimulator in I have written a very short course of pain medicine for you to take. Please take as prescribed.

## 2019-07-25 DIAGNOSIS — M545 Low back pain: Secondary | ICD-10-CM | POA: Diagnosis not present

## 2019-07-26 ENCOUNTER — Ambulatory Visit: Payer: Medicare Other | Admitting: Podiatry

## 2019-07-27 DIAGNOSIS — R030 Elevated blood-pressure reading, without diagnosis of hypertension: Secondary | ICD-10-CM | POA: Diagnosis not present

## 2019-07-27 DIAGNOSIS — G90523 Complex regional pain syndrome I of lower limb, bilateral: Secondary | ICD-10-CM | POA: Diagnosis not present

## 2019-07-27 DIAGNOSIS — Z6826 Body mass index (BMI) 26.0-26.9, adult: Secondary | ICD-10-CM | POA: Diagnosis not present

## 2019-08-02 ENCOUNTER — Other Ambulatory Visit: Payer: Self-pay | Admitting: Orthopedic Surgery

## 2019-08-02 ENCOUNTER — Telehealth: Payer: Self-pay | Admitting: Nurse Practitioner

## 2019-08-02 DIAGNOSIS — M545 Low back pain, unspecified: Secondary | ICD-10-CM

## 2019-08-02 DIAGNOSIS — K580 Irritable bowel syndrome with diarrhea: Secondary | ICD-10-CM | POA: Diagnosis not present

## 2019-08-02 DIAGNOSIS — G8929 Other chronic pain: Secondary | ICD-10-CM

## 2019-08-02 NOTE — Telephone Encounter (Signed)
Phone call to patient to verify medication list and allergies for myelogram procedure. Pt instructed to hold Tramadol, Phenergan and Trilafon for 48hrs prior to myelogram appointment time. Pt verbalized understanding. Pre and post procedure instructions reviewed with pt.

## 2019-08-08 ENCOUNTER — Ambulatory Visit
Admission: RE | Admit: 2019-08-08 | Discharge: 2019-08-08 | Disposition: A | Discharge status: Home / Self Care | Payer: Medicare Other | Source: Ambulatory Visit | Attending: Orthopedic Surgery | Admitting: Orthopedic Surgery

## 2019-08-08 ENCOUNTER — Other Ambulatory Visit: Payer: Self-pay

## 2019-08-08 DIAGNOSIS — G8929 Other chronic pain: Secondary | ICD-10-CM

## 2019-08-08 DIAGNOSIS — M5126 Other intervertebral disc displacement, lumbar region: Secondary | ICD-10-CM | POA: Diagnosis not present

## 2019-08-08 DIAGNOSIS — M4316 Spondylolisthesis, lumbar region: Secondary | ICD-10-CM | POA: Diagnosis not present

## 2019-08-08 DIAGNOSIS — M545 Low back pain, unspecified: Secondary | ICD-10-CM

## 2019-08-08 DIAGNOSIS — M48061 Spinal stenosis, lumbar region without neurogenic claudication: Secondary | ICD-10-CM | POA: Diagnosis not present

## 2019-08-08 MED ORDER — DIAZEPAM 5 MG PO TABS
5.0000 mg | ORAL_TABLET | Freq: Once | ORAL | Status: AC
  Administered 2019-08-08: 08:00:00 5 mg via ORAL

## 2019-08-08 MED ORDER — IOPAMIDOL (ISOVUE-M 200) INJECTION 41%
20.0000 mL | Freq: Once | INTRAMUSCULAR | Status: AC
  Administered 2019-08-08: 20 mL via INTRATHECAL

## 2019-08-08 NOTE — Discharge Instructions (Signed)
Myelogram Discharge Instructions  1. Go home and rest quietly for the next 24 hours.  It is important to lie flat for the next 24 hours.  Get up only to go to the restroom.  You may lie in the bed or on a couch on your back, your stomach, your left side or your right side.  You may have one pillow under your head.  You may have pillows between your knees while you are on your side or under your knees while you are on your back.  2. DO NOT drive today.  Recline the seat as far back as it will go, while still wearing your seat belt, on the way home.  3. You may get up to go to the bathroom as needed.  You may sit up for 10 minutes to eat.  You may resume your normal diet and medications unless otherwise indicated.  Drink lots of extra fluids today and tomorrow.  4. The incidence of headache, nausea, or vomiting is about 5% (one in 20 patients).  If you develop a headache, lie flat and drink plenty of fluids until the headache goes away.  Caffeinated beverages may be helpful.  If you develop severe nausea and vomiting or a headache that does not go away with flat bed rest, call [phone].  5. You may resume normal activities after your 24 hours of bed rest is over; however, do not exert yourself strongly or do any heavy lifting tomorrow. If when you get up you have a headache when standing, go back to bed and force fluids for another 24 hours.  6. Call your physician for a follow-up appointment.  The results of your myelogram will be sent directly to your physician by the following day.  7. If you have any questions or if complications develop after you arrive home, please call [phone].  Discharge instructions have been explained to the patient.  The patient, or the person responsible for the patient, fully understands these instructions.  YOU MAY RESTART YOUR PHENERGAN, TRAMADOL AND TRILAFON TOMORROW 08/09/2019 AT 08:30AM.

## 2019-08-14 DIAGNOSIS — H0011 Chalazion right upper eyelid: Secondary | ICD-10-CM | POA: Diagnosis not present

## 2019-08-14 DIAGNOSIS — H00019 Hordeolum externum unspecified eye, unspecified eyelid: Secondary | ICD-10-CM | POA: Diagnosis not present

## 2019-08-15 DIAGNOSIS — H00011 Hordeolum externum right upper eyelid: Secondary | ICD-10-CM | POA: Diagnosis not present

## 2019-08-20 DIAGNOSIS — M5416 Radiculopathy, lumbar region: Secondary | ICD-10-CM | POA: Diagnosis not present

## 2019-09-10 DIAGNOSIS — E559 Vitamin D deficiency, unspecified: Secondary | ICD-10-CM | POA: Diagnosis not present

## 2019-09-10 LAB — VITAMIN D 25 HYDROXY (VIT D DEFICIENCY, FRACTURES): Vit D, 25-Hydroxy: 31.6

## 2019-09-17 DIAGNOSIS — L820 Inflamed seborrheic keratosis: Secondary | ICD-10-CM | POA: Diagnosis not present

## 2019-09-20 ENCOUNTER — Other Ambulatory Visit: Payer: Self-pay

## 2019-09-20 ENCOUNTER — Ambulatory Visit (INDEPENDENT_AMBULATORY_CARE_PROVIDER_SITE_OTHER): Payer: Medicare Other

## 2019-09-20 ENCOUNTER — Ambulatory Visit (INDEPENDENT_AMBULATORY_CARE_PROVIDER_SITE_OTHER): Payer: Medicare Other | Admitting: Podiatry

## 2019-09-20 DIAGNOSIS — M7751 Other enthesopathy of right foot: Secondary | ICD-10-CM

## 2019-09-20 DIAGNOSIS — M7750 Other enthesopathy of unspecified foot: Secondary | ICD-10-CM

## 2019-09-21 DIAGNOSIS — Z23 Encounter for immunization: Secondary | ICD-10-CM | POA: Diagnosis not present

## 2019-09-26 DIAGNOSIS — M25572 Pain in left ankle and joints of left foot: Secondary | ICD-10-CM | POA: Diagnosis not present

## 2019-09-26 DIAGNOSIS — M25571 Pain in right ankle and joints of right foot: Secondary | ICD-10-CM | POA: Diagnosis not present

## 2019-09-27 DIAGNOSIS — F250 Schizoaffective disorder, bipolar type: Secondary | ICD-10-CM | POA: Diagnosis not present

## 2019-09-28 ENCOUNTER — Telehealth: Payer: Self-pay | Admitting: Adult Health

## 2019-09-28 NOTE — Telephone Encounter (Signed)
Pt would like to know if Dr Jannifer Franklin would approve her to try Nucynta for nerve damage.  Pt is asking not to be sent to pain management.  Pt again states she would like to try the medication, she is aware it is very expensive.  Please call

## 2019-10-01 MED ORDER — TAPENTADOL HCL 50 MG PO TABS: 50.0000 mg | ORAL_TABLET | Freq: Four times a day (QID) | ORAL | 0 refills | Status: DC | PRN

## 2019-10-01 NOTE — Addendum Note (Signed)
Addended by: Kathrynn Ducking on: 10/01/2019 04:50 PM   Modules accepted: Orders

## 2019-10-01 NOTE — Telephone Encounter (Signed)
I called the patient.  The patient is followed by Dr. Maryjean Ka for pain, she is considering switching physicians, she wants to try Nucynta.  She has tried Ultram previously, she has been given oxycodone but cannot tolerate it, she has tried Tylenol 3.  She has a syndrome consistent with reflex sympathetic dystrophy.  I will give her a trial on the Nucynta, but if this seems to help her and she wishes to continue, she will need to go through a pain center.

## 2019-10-01 NOTE — Telephone Encounter (Signed)
Pharmacy states the tapentadol (NUCYNTA) 50 MG tablet is not covered by insurance and is very expensive. Please advise

## 2019-10-02 NOTE — Telephone Encounter (Signed)
The patient indicated that she knew the medication was expensive, she wanted to try to see if it helped, I would let her make the decision whether or not to pick up the prescription.

## 2019-10-02 NOTE — Telephone Encounter (Signed)
I reached out to the pt and advised of this information and she was agreeable. She reports she has not talked with the pharmacy yet about what the price would be though.

## 2019-10-02 NOTE — Telephone Encounter (Signed)
I attempted a PA on cover my meds.  PA was denied due to the pt not having a documented trial and failure to both morphine immediate release tablet AND hydromorphone. Per insurance guidelines pt would have to have tried both medications before Nucynta would be approved.  Information for PA: Tanna Savoy - PA Case ID: [id number]

## 2019-10-04 ENCOUNTER — Other Ambulatory Visit: Payer: Self-pay | Admitting: Family Medicine

## 2019-10-04 DIAGNOSIS — Z1231 Encounter for screening mammogram for malignant neoplasm of breast: Secondary | ICD-10-CM

## 2019-10-08 DIAGNOSIS — Z9689 Presence of other specified functional implants: Secondary | ICD-10-CM | POA: Diagnosis not present

## 2019-10-08 DIAGNOSIS — G9050 Complex regional pain syndrome I, unspecified: Secondary | ICD-10-CM | POA: Diagnosis not present

## 2019-10-08 NOTE — Progress Notes (Signed)
Subjective: 68 year old female presents the office today for second opinion in regards to possible hardware removal.  She previously underwent bilateral ankle, calcaneal ORIF with Dr. Fredonia Highland.  Since I last saw her in 2019 she has been diagnosed with CRPS.  She has a spinal cord stimulator.  She is interested in trying Nucynta.  Her main concern for today's visit is received if removal of the hardware would help with her pain.  Denies any systemic complaints such as fevers, chills, nausea, vomiting. No acute changes since last appointment, and no other complaints at this time.   Objective: AAO x3, NAD DP/PT pulses palpable bilaterally, CRT less than 3 seconds There is prominent hardware from the ankle fractures along the fibula bilaterally.  No specific pain is identified in this area.  Seems more of her pain is consistent with nerve issues.  There is no significant edema is no erythema. No pain with calf compression, swelling, warmth, erythema  Assessment: Chronic bilateral lower extremity pain, CRP  Plan: -All treatment options discussed with the patient including all alternatives, risks, complications.  -X-rays obtained and reviewed.  Status post open reduction internal fixation of calcaneal and ankle fractures with adequate alignment.  No evidence of acute fracture or hardware failure. -At this time I do not think that removal of the hardware from the ankle fractures is going to significantly improve her symptoms.  She is interested in trying Nucynta.  Recommended her to follow-up with her pain management doctor for this. -She is going to follow-up with Dr. Percell Miller as well. -Patient encouraged to call the office with any questions, concerns, change in symptoms.   Trula Slade DPM

## 2019-10-15 DIAGNOSIS — J209 Acute bronchitis, unspecified: Secondary | ICD-10-CM | POA: Diagnosis not present

## 2019-10-25 DIAGNOSIS — M5416 Radiculopathy, lumbar region: Secondary | ICD-10-CM | POA: Diagnosis not present

## 2019-10-25 DIAGNOSIS — M5136 Other intervertebral disc degeneration, lumbar region: Secondary | ICD-10-CM | POA: Diagnosis not present

## 2019-10-25 DIAGNOSIS — Z4689 Encounter for fitting and adjustment of other specified devices: Secondary | ICD-10-CM | POA: Diagnosis not present

## 2019-10-25 DIAGNOSIS — M4317 Spondylolisthesis, lumbosacral region: Secondary | ICD-10-CM | POA: Diagnosis not present

## 2019-10-25 DIAGNOSIS — M47896 Other spondylosis, lumbar region: Secondary | ICD-10-CM | POA: Diagnosis not present

## 2019-11-06 DIAGNOSIS — E785 Hyperlipidemia, unspecified: Secondary | ICD-10-CM | POA: Insufficient documentation

## 2019-11-06 DIAGNOSIS — N959 Unspecified menopausal and perimenopausal disorder: Secondary | ICD-10-CM | POA: Diagnosis not present

## 2019-11-06 DIAGNOSIS — G8929 Other chronic pain: Secondary | ICD-10-CM | POA: Insufficient documentation

## 2019-11-20 DIAGNOSIS — D225 Melanocytic nevi of trunk: Secondary | ICD-10-CM | POA: Diagnosis not present

## 2019-11-20 DIAGNOSIS — L300 Nummular dermatitis: Secondary | ICD-10-CM | POA: Diagnosis not present

## 2019-11-20 DIAGNOSIS — D485 Neoplasm of uncertain behavior of skin: Secondary | ICD-10-CM | POA: Diagnosis not present

## 2019-11-20 DIAGNOSIS — D692 Other nonthrombocytopenic purpura: Secondary | ICD-10-CM | POA: Diagnosis not present

## 2019-11-20 DIAGNOSIS — Z85828 Personal history of other malignant neoplasm of skin: Secondary | ICD-10-CM | POA: Diagnosis not present

## 2019-11-20 DIAGNOSIS — L820 Inflamed seborrheic keratosis: Secondary | ICD-10-CM | POA: Diagnosis not present

## 2019-11-20 DIAGNOSIS — D3610 Benign neoplasm of peripheral nerves and autonomic nervous system, unspecified: Secondary | ICD-10-CM | POA: Diagnosis not present

## 2019-11-20 DIAGNOSIS — D2362 Other benign neoplasm of skin of left upper limb, including shoulder: Secondary | ICD-10-CM | POA: Diagnosis not present

## 2019-11-20 DIAGNOSIS — L821 Other seborrheic keratosis: Secondary | ICD-10-CM | POA: Diagnosis not present

## 2019-11-22 DIAGNOSIS — M5416 Radiculopathy, lumbar region: Secondary | ICD-10-CM | POA: Diagnosis not present

## 2019-11-22 DIAGNOSIS — M5136 Other intervertebral disc degeneration, lumbar region: Secondary | ICD-10-CM | POA: Diagnosis not present

## 2019-11-22 DIAGNOSIS — G894 Chronic pain syndrome: Secondary | ICD-10-CM | POA: Diagnosis not present

## 2019-11-22 DIAGNOSIS — R82998 Other abnormal findings in urine: Secondary | ICD-10-CM | POA: Diagnosis not present

## 2019-11-22 DIAGNOSIS — M4317 Spondylolisthesis, lumbosacral region: Secondary | ICD-10-CM | POA: Diagnosis not present

## 2019-11-22 DIAGNOSIS — Z79891 Long term (current) use of opiate analgesic: Secondary | ICD-10-CM | POA: Diagnosis not present

## 2019-11-27 ENCOUNTER — Ambulatory Visit: Payer: BLUE CROSS/BLUE SHIELD

## 2019-11-27 DIAGNOSIS — M5416 Radiculopathy, lumbar region: Secondary | ICD-10-CM | POA: Diagnosis not present

## 2019-12-04 ENCOUNTER — Other Ambulatory Visit: Payer: Self-pay

## 2019-12-04 ENCOUNTER — Ambulatory Visit (INDEPENDENT_AMBULATORY_CARE_PROVIDER_SITE_OTHER): Payer: Medicare Other

## 2019-12-04 ENCOUNTER — Ambulatory Visit (INDEPENDENT_AMBULATORY_CARE_PROVIDER_SITE_OTHER): Payer: Medicare Other | Admitting: Podiatry

## 2019-12-04 ENCOUNTER — Encounter

## 2019-12-04 DIAGNOSIS — M2060 Acquired deformities of toe(s), unspecified, unspecified foot: Secondary | ICD-10-CM

## 2019-12-04 DIAGNOSIS — M2031 Hallux varus (acquired), right foot: Secondary | ICD-10-CM

## 2019-12-04 DIAGNOSIS — M216X2 Other acquired deformities of left foot: Secondary | ICD-10-CM

## 2019-12-04 DIAGNOSIS — M25572 Pain in left ankle and joints of left foot: Secondary | ICD-10-CM | POA: Diagnosis not present

## 2019-12-04 DIAGNOSIS — G8929 Other chronic pain: Secondary | ICD-10-CM | POA: Diagnosis not present

## 2019-12-04 DIAGNOSIS — M216X1 Other acquired deformities of right foot: Secondary | ICD-10-CM

## 2019-12-04 DIAGNOSIS — M779 Enthesopathy, unspecified: Secondary | ICD-10-CM

## 2019-12-04 DIAGNOSIS — M25571 Pain in right ankle and joints of right foot: Secondary | ICD-10-CM

## 2019-12-05 NOTE — Progress Notes (Signed)
Subjective: 68 year old female presents the office today for concerns of her right big toe turning inwards as well as for her swelling on the right side.  She is been using over-the-counter inserts, power steps which is helpful for some time however she is concerned her big toe going inwards and this is causing her to be off balance.  She does have chronic pain to her feet after sustaining bilateral ankle, calcaneal fractures.  She has a spinal cord stimulator.  She does get chronic pain to the tops of her feet as well but this is not new.  She denies any recent injury since I last saw her.  She did talk to her neurologist and she was started on Nucynta which was helpful but due to cost she is no longer taking that. Denies any systemic complaints such as fevers, chills, nausea, vomiting. No acute changes since last appointment, and no other complaints at this time.   Objective: AAO x3, NAD DP/PT pulses palpable bilaterally, CRT less than 3 seconds Hallux varus is present on the right side with medial deviation of the lesser digits.  There is a decrease in medial arch upon weightbearing.  Mild chronic tenderness palpation of the dorsal aspect of the midfoot.  Mild global tenderness of the ankle as well.  Minimal edema but there is no erythema or warmth. No open lesions or pre-ulcerative lesions.  No pain with calf compression, swelling, warmth, erythema  Assessment: Hallux varus, digital deformity; flatfoot  Plan: -All treatment options discussed with the patient including all alternatives, risks, complications.  -X-rays obtained and reviewed.  Hallux varus with medial deviation of the digits were identified.  Hardware from prior surgery is intact. -I had Liliane Channel evaluate her today and remeasure her for inserts.  Discussed shoe modifications as well.  Discussed splints help hold the toe over in rectus position. -Patient encouraged to call the office with any questions, concerns, change in symptoms.    Trula Slade DPM

## 2019-12-12 ENCOUNTER — Other Ambulatory Visit: Payer: Self-pay | Admitting: Podiatry

## 2019-12-12 DIAGNOSIS — M2031 Hallux varus (acquired), right foot: Secondary | ICD-10-CM

## 2019-12-17 LAB — NOVEL CORONAVIRUS, NAA: SARS-CoV-2, NAA: NEGATIVE

## 2020-01-01 ENCOUNTER — Ambulatory Visit: Payer: Medicare Other | Admitting: Orthotics

## 2020-01-01 ENCOUNTER — Other Ambulatory Visit: Payer: Self-pay

## 2020-01-01 DIAGNOSIS — M779 Enthesopathy, unspecified: Secondary | ICD-10-CM

## 2020-01-01 DIAGNOSIS — M216X1 Other acquired deformities of right foot: Secondary | ICD-10-CM

## 2020-01-01 DIAGNOSIS — G8929 Other chronic pain: Secondary | ICD-10-CM

## 2020-01-01 DIAGNOSIS — M2060 Acquired deformities of toe(s), unspecified, unspecified foot: Secondary | ICD-10-CM

## 2020-01-01 DIAGNOSIS — M216X2 Other acquired deformities of left foot: Secondary | ICD-10-CM

## 2020-01-01 DIAGNOSIS — M2031 Hallux varus (acquired), right foot: Secondary | ICD-10-CM

## 2020-01-01 NOTE — Progress Notes (Signed)
Patient came in today to pick up custom made foot orthotics.  The goals were accomplished and the patient reported no dissatisfaction with said orthotics.  Patient was advised of breakin period and how to report any issues. 

## 2020-01-02 NOTE — Progress Notes (Deleted)
PATIENT: Nichole Delgado DOB: 1951/12/04  REASON FOR VISIT: follow up HISTORY FROM: patient  HISTORY OF PRESENT ILLNESS: Today 01/02/20  Nichole Delgado is a 69 year old female with history of migraine headaches associated with vertigo.  She has benefited from propanolol.  Dr. Jannifer Franklin has given her a trial on Nucynta, as she has a syndrome consistent with reflex sympathetic dystrophy.  HISTORY 01/01/2019 MM: Nichole Delgado is a 69 year old female with a history of migraine headaches associated with vertigo.  She returns today for follow-up.  She states that since she started propranolol she has noticed benefit.  She states that she has not had a headache/vertigo since August.  She states that when she does have an event it typically starts with vertigo -she never has a true head pain.  She has had vomiting light and noise sensitivity but denies visual disturbances.  She states that taking Maxalt offers her no benefit.  She states that typically she has to lay down for 3 to 4 hours and it will resolve.  She has approximately one severe migraine a month otherwise she may have one mild to moderate episode every 2 weeks.  She states that since she had a spinal cord stimulator placed she has had 3 episodes where she was seeing red spots in her vision.  Reports that she typically can just go to sleep and they resolve.  Her last episode was in October.  She sees her ophthalmologist at the end of the month.  She returns today for evaluation.  REVIEW OF SYSTEMS: Out of a complete 14 system review of symptoms, the patient complains only of the following symptoms, and all other reviewed systems are negative.  ALLERGIES: Allergies  Allergen Reactions  . Penicillins Hives    Has patient had a PCN reaction causing immediate rash, facial/tongue/throat swelling, SOB or lightheadedness with hypotension: No Has patient had a PCN reaction causing severe rash involving mucus membranes or skin necrosis: Yes Has  patient had a PCN reaction that required hospitalization No Has patient had a PCN reaction occurring within the last 10 years: No If all of the above answers are "NO", then may proceed with Cephalosporin use.   . Sulfa Antibiotics Hives and Rash  . Other     States she has allergies to other seizure medications but doesn't know which ones  . Codeine Nausea Only  . Keppra [Levetiracetam] Rash  . Topamax [Topiramate] Nausea Only    Weight loss    HOME MEDICATIONS: Outpatient Medications Prior to Visit  Medication Sig Dispense Refill  . acetaminophen-codeine (TYLENOL #3) 300-30 MG tablet Take 1 tablet by mouth every 8 (eight) hours as needed.    Marland Kitchen albuterol (VENTOLIN HFA) 108 (90 Base) MCG/ACT inhaler Inhale 2 puffs into the lungs every 6 (six) hours as needed for wheezing or shortness of breath.    . Ca Phosphate-Cholecalciferol (CALCIUM 500 + D3) 250-500 MG-UNIT CHEW Chew 1 each by mouth 2 (two) times daily.    . diclofenac sodium (VOLTAREN) 1 % GEL APPLY 1 GRAM TOPICALLY EVERY 6 TO 8 HOURS AS NEEDED FOR PAIN    . Eluxadoline (VIBERZI) 75 MG TABS Take 1 tablet by mouth 2 (two) times daily.     Marland Kitchen estradiol (CLIMARA - DOSED IN MG/24 HR) 0.0375 mg/24hr patch APPLY 1 PATCH TOPICALLY ONCE A WEEK AS DIRECTED    . gabapentin (NEURONTIN) 100 MG capsule Take 100 mg by mouth every 4 (four) hours.     Marland Kitchen ibuprofen (ADVIL,MOTRIN) 200  MG tablet Take 400 mg by mouth every 6 (six) hours as needed for mild pain.    . metroNIDAZOLE (METROGEL) 0.75 % gel Apply 1 application topically 2 (two) times daily as needed (rosacea). Apply to face  3  . NONFORMULARY OR COMPOUNDED [place]:  Neuropathy Cream - bupivacaine 1%, doxepin 3%, gabapentin 6%, pentoxifylline 3%, topiramate 1%, apply 1-2 grams to affected area 3-4 times daily. (Patient not taking: Reported on 01/01/2019) 120 each 2  . nortriptyline (PAMELOR) 25 MG capsule Take 25 mg by mouth at bedtime.    Marland Kitchen omeprazole (PRILOSEC) 40 MG capsule Take  40 mg by mouth daily as needed (indigestion).     Marland Kitchen oxyCODONE-acetaminophen (PERCOCET/ROXICET) 5-325 MG tablet Take 1 tablet by mouth every 4 (four) hours as needed for severe pain. 8 tablet 0  . perphenazine (TRILAFON) 2 MG tablet Take 2 mg by mouth at bedtime.  4  . pravastatin (PRAVACHOL) 40 MG tablet Take 40 mg by mouth at bedtime.     . promethazine (PHENERGAN) 25 MG tablet Take 1 tablet (25 mg total) by mouth every 8 (eight) hours as needed for nausea or vomiting. 30 tablet 2  . propranolol (INDERAL) 20 MG tablet TAKE 1 TABLET BY MOUTH TWICE A DAY 180 tablet 1  . tapentadol (NUCYNTA) 50 MG tablet Take 1 tablet (50 mg total) by mouth every 6 (six) hours as needed. 30 tablet 0  . traMADol (ULTRAM) 50 MG tablet Take 50 mg by mouth 2 (two) times daily as needed for pain.  1  . Vitamin D, Ergocalciferol, (DRISDOL) 1.25 MG (50000 UT) CAPS capsule 1 CAPSULE BY MOUTH WEEKLY    . zolpidem (AMBIEN) 10 MG tablet Take 10 mg by mouth at bedtime as needed for sleep.     No facility-administered medications prior to visit.    PAST MEDICAL HISTORY: Past Medical History:  Diagnosis Date  . Anxiety   . Cervical spondylosis   . Common migraine with intractable migraine 12/29/2017  . Complex regional pain syndrome I   . HLD (hyperlipidemia)   . IBS (irritable bowel syndrome)   . Migraines   . Tobacco abuse   . Vertigo 07/07/2016    PAST SURGICAL HISTORY: Past Surgical History:  Procedure Laterality Date  . CATARACT EXTRACTION Bilateral   . COLONOSCOPY W/ POLYPECTOMY    . FOOT SURGERY Right 2011   hammer toe  . ORIF ANKLE FRACTURE Left 03/27/2017   Procedure: OPEN REDUCTION INTERNAL FIXATION (ORIF) BIMALLEOLAR ANKLE FRACTURE;  Surgeon: Renette Butters, MD;  Location: [facility];  Service: Orthopedics;  Laterality: Left;  . ORIF CALCANEOUS FRACTURE Right 04/05/2017   Procedure: OPEN REDUCTION INTERNAL FIXATION (ORIF) CALCANEOUS AND FIBULA FRACTURE;  Surgeon: Renette Butters, MD;  Location: [facility];   Service: Orthopedics;  Laterality: Right;  . PARTIAL HYSTERECTOMY  1988  . SPINAL CORD STIMULATOR INSERTION  08/15/2018    FAMILY HISTORY: Family History  Problem Relation Age of Onset  . Heart attack Father   . Migraines Mother   . Cancer Sister        Pancreatic  . Cancer Brother        Liver  . Migraines Daughter   . Breast cancer Neg Hx     SOCIAL HISTORY: Social History   Socioeconomic History  . Marital status: Married    Spouse name: Not on file  . Number of children: 1  . Years of education: 6  . Highest education level: Not on file  Occupational History  . Occupation: N/A  Tobacco Use  . Smoking status: Current Every Day Smoker    Packs/day: 1.00    Years: 35.00    Pack years: 35.00    Types: Cigarettes  . Smokeless tobacco: Never Used  . Tobacco comment: started smoking when she was 69 years old  Substance and Sexual Activity  . Alcohol use: No    Alcohol/week: 0.0 standard drinks    Comment: 1 drink every few months  . Drug use: No  . Sexual activity: Not on file  Other Topics Concern  . Not on file  Social History Narrative   Lives at home with her husband   Right-handed   Drinks about 5 cups of coffee per day   Social Determinants of Health   Financial Resource Strain:   . Difficulty of Paying Living Expenses: Not on file  Food Insecurity:   . Worried About Charity fundraiser in the Last Year: Not on file  . Ran Out of Food in the Last Year: Not on file  Transportation Needs:   . Lack of Transportation (Medical): Not on file  . Lack of Transportation (Non-Medical): Not on file  Physical Activity:   . Days of Exercise per Week: Not on file  . Minutes of Exercise per Session: Not on file  Stress:   . Feeling of Stress : Not on file  Social Connections:   . Frequency of Communication with Friends and Family: Not on file  . Frequency of Social Gatherings with Friends and Family: Not on file  . Attends Religious Services: Not on file  .  Active Member of Clubs or Organizations: Not on file  . Attends Archivist Meetings: Not on file  . Marital Status: Not on file  Intimate Partner Violence:   . Fear of Current or Ex-Partner: Not on file  . Emotionally Abused: Not on file  . Physically Abused: Not on file  . Sexually Abused: Not on file      PHYSICAL EXAM  There were no vitals filed for this visit. There is no height or weight on file to calculate BMI.  Generalized: Well developed, in no acute distress   Neurological examination  Mentation: Alert oriented to time, place, history taking. Follows all commands speech and language fluent Cranial nerve II-XII: Pupils were equal round reactive to light. Extraocular movements were full, visual field were full on confrontational test. Facial sensation and strength were normal. Uvula tongue midline. Head turning and shoulder shrug  were normal and symmetric. Motor: The motor testing reveals 5 over 5 strength of all 4 extremities. Good symmetric motor tone is noted throughout.  Sensory: Sensory testing is intact to soft touch on all 4 extremities. No evidence of extinction is noted.  Coordination: Cerebellar testing reveals good finger-nose-finger and heel-to-shin bilaterally.  Gait and station: Gait is normal. Tandem gait is normal. Romberg is negative. No drift is seen.  Reflexes: Deep tendon reflexes are symmetric and normal bilaterally.   DIAGNOSTIC DATA (LABS, IMAGING, TESTING) - I reviewed patient records, labs, notes, testing and imaging myself where available.  Lab Results  Component Value Date   WBC 12.0 (H) 08/04/2018   HGB 15.3 (H) 08/04/2018   HCT 45.0 08/04/2018   MCV 90.9 08/04/2018   PLT 291 08/04/2018      Component Value Date/Time   NA 146 (H) 08/04/2018 1130   NA 140 07/07/2016 1016   K 4.0 08/04/2018 1130   CL 112 (H)  08/04/2018 1130   CO2 27 08/04/2018 1130   GLUCOSE 108 (H) 08/04/2018 1130   BUN 10 08/04/2018 1130   BUN 17  07/07/2016 1016   CREATININE 0.82 08/04/2018 1130   CALCIUM 9.1 08/04/2018 1130   PROT 6.2 (L) 08/04/2018 1130   PROT 6.5 07/07/2016 1016   ALBUMIN 3.8 08/04/2018 1130   ALBUMIN 4.5 07/07/2016 1016   AST 16 08/04/2018 1130   ALT 15 08/04/2018 1130   ALKPHOS 49 08/04/2018 1130   BILITOT 0.8 08/04/2018 1130   BILITOT 0.3 07/07/2016 1016   GFRNONAA >60 08/04/2018 1130   GFRAA >60 08/04/2018 1130   No results found for: CHOL, HDL, LDLCALC, LDLDIRECT, TRIG, CHOLHDL No results found for: HGBA1C No results found for: VITAMINB12 No results found for: TSH    ASSESSMENT AND PLAN 69 y.o. year old female  has a past medical history of Anxiety, Cervical spondylosis, Common migraine with intractable migraine (12/29/2017), Complex regional pain syndrome I, HLD (hyperlipidemia), IBS (irritable bowel syndrome), Migraines, Tobacco abuse, and Vertigo (07/07/2016). here with ***   I spent 15 minutes with the patient. 50% of this time was spent   Butler Denmark, Henrietta, DNP 01/02/2020, 8:57 PM [facility] [address] [place], [place] [address] [phone]

## 2020-01-03 ENCOUNTER — Encounter: Payer: Self-pay | Admitting: Neurology

## 2020-01-03 ENCOUNTER — Ambulatory Visit: Payer: Medicare Other | Admitting: Neurology

## 2020-01-04 DIAGNOSIS — F250 Schizoaffective disorder, bipolar type: Secondary | ICD-10-CM | POA: Diagnosis not present

## 2020-01-14 ENCOUNTER — Other Ambulatory Visit: Payer: Self-pay

## 2020-01-14 ENCOUNTER — Ambulatory Visit
Admission: RE | Admit: 2020-01-14 | Discharge: 2020-01-14 | Disposition: A | Discharge status: Home / Self Care | Payer: Medicare Other | Source: Ambulatory Visit | Attending: Family Medicine | Admitting: Family Medicine

## 2020-01-14 ENCOUNTER — Telehealth: Payer: Self-pay

## 2020-01-14 DIAGNOSIS — Z1231 Encounter for screening mammogram for malignant neoplasm of breast: Secondary | ICD-10-CM | POA: Diagnosis not present

## 2020-01-14 NOTE — Telephone Encounter (Signed)
I spoke with patient and she wants to do some research on some of the other providers in regards to switching. She'll call us back when she's made a decision.

## 2020-01-14 NOTE — Telephone Encounter (Signed)
Patient called attempting to schedule an apt with Dr.Wilis however no soon apt until April. patient declined to see a NP and would like to know if she can switch providers due to the availability and upcoming retirement of Marshall.  Please follow up

## 2020-01-16 DIAGNOSIS — M25572 Pain in left ankle and joints of left foot: Secondary | ICD-10-CM | POA: Diagnosis not present

## 2020-01-16 DIAGNOSIS — M25571 Pain in right ankle and joints of right foot: Secondary | ICD-10-CM | POA: Diagnosis not present

## 2020-01-17 DIAGNOSIS — L905 Scar conditions and fibrosis of skin: Secondary | ICD-10-CM | POA: Diagnosis not present

## 2020-01-19 ENCOUNTER — Ambulatory Visit: Payer: BLUE CROSS/BLUE SHIELD

## 2020-01-24 ENCOUNTER — Ambulatory Visit: Payer: BLUE CROSS/BLUE SHIELD

## 2020-01-28 ENCOUNTER — Ambulatory Visit: Payer: Medicare Other | Attending: Internal Medicine

## 2020-01-28 DIAGNOSIS — Z23 Encounter for immunization: Secondary | ICD-10-CM

## 2020-01-28 NOTE — Progress Notes (Signed)
   Covid-19 Vaccination Clinic  Name:  Nichole Delgado    MRN: [id number] DOB: May 30, 1951  01/28/2020  [name] was observed post Covid-19 immunization for 15 minutes without incidence. She was provided with Vaccine Information Sheet and instruction to access the V-Safe system.   [name] was instructed to call 911 with any severe reactions post vaccine: Marland Kitchen Difficulty breathing  . Swelling of your face and throat  . A fast heartbeat  . A bad rash all over your body  . Dizziness and weakness    Immunizations Administered    Name Date Dose VIS Date Route   Pfizer COVID-19 Vaccine 01/28/2020  9:04 AM 0.3 mL 11/30/2019 Intramuscular   Manufacturer: [place]   Lot: [id number]   [place]: [id number]

## 2020-02-04 DIAGNOSIS — M79671 Pain in right foot: Secondary | ICD-10-CM | POA: Diagnosis not present

## 2020-02-04 DIAGNOSIS — M79672 Pain in left foot: Secondary | ICD-10-CM | POA: Diagnosis not present

## 2020-02-07 DIAGNOSIS — M5136 Other intervertebral disc degeneration, lumbar region: Secondary | ICD-10-CM | POA: Diagnosis not present

## 2020-02-07 DIAGNOSIS — M4317 Spondylolisthesis, lumbosacral region: Secondary | ICD-10-CM | POA: Diagnosis not present

## 2020-02-07 DIAGNOSIS — M5416 Radiculopathy, lumbar region: Secondary | ICD-10-CM | POA: Diagnosis not present

## 2020-02-13 DIAGNOSIS — M79671 Pain in right foot: Secondary | ICD-10-CM | POA: Diagnosis not present

## 2020-02-13 DIAGNOSIS — M79672 Pain in left foot: Secondary | ICD-10-CM | POA: Diagnosis not present

## 2020-02-15 DIAGNOSIS — M79672 Pain in left foot: Secondary | ICD-10-CM | POA: Diagnosis not present

## 2020-02-15 DIAGNOSIS — M79671 Pain in right foot: Secondary | ICD-10-CM | POA: Diagnosis not present

## 2020-02-20 DIAGNOSIS — L728 Other follicular cysts of the skin and subcutaneous tissue: Secondary | ICD-10-CM | POA: Diagnosis not present

## 2020-02-22 ENCOUNTER — Ambulatory Visit: Payer: Medicare Other | Attending: Internal Medicine

## 2020-02-22 DIAGNOSIS — Z23 Encounter for immunization: Secondary | ICD-10-CM | POA: Insufficient documentation

## 2020-02-22 NOTE — Progress Notes (Signed)
   Covid-19 Vaccination Clinic  Name:  Nichole Delgado    MRN: [id number] DOB: 1951/09/25  02/22/2020  [name] was observed post Covid-19 immunization for 15 minutes without incident. She was provided with Vaccine Information Sheet and instruction to access the V-Safe system.   [name] was instructed to call 911 with any severe reactions post vaccine: Marland Kitchen Difficulty breathing  . Swelling of face and throat  . A fast heartbeat  . A bad rash all over body  . Dizziness and weakness   Immunizations Administered    Name Date Dose VIS Date Route   Pfizer COVID-19 Vaccine 02/22/2020  8:53 AM 0.3 mL 11/30/2019 Intramuscular   Manufacturer: [place]   Lot: [id number]   [place]: [id number]

## 2020-02-26 DIAGNOSIS — M79672 Pain in left foot: Secondary | ICD-10-CM | POA: Diagnosis not present

## 2020-02-26 DIAGNOSIS — M79671 Pain in right foot: Secondary | ICD-10-CM | POA: Diagnosis not present

## 2020-02-28 DIAGNOSIS — M79671 Pain in right foot: Secondary | ICD-10-CM | POA: Diagnosis not present

## 2020-02-28 DIAGNOSIS — M79672 Pain in left foot: Secondary | ICD-10-CM | POA: Diagnosis not present

## 2020-03-02 ENCOUNTER — Other Ambulatory Visit: Payer: Self-pay

## 2020-03-02 ENCOUNTER — Encounter (HOSPITAL_BASED_OUTPATIENT_CLINIC_OR_DEPARTMENT_OTHER): Payer: Self-pay | Admitting: Emergency Medicine

## 2020-03-02 ENCOUNTER — Emergency Department (HOSPITAL_BASED_OUTPATIENT_CLINIC_OR_DEPARTMENT_OTHER)
Admission: EM | Admit: 2020-03-02 | Discharge: 2020-03-02 | Disposition: A | Discharge status: Home / Self Care | Payer: No Typology Code available for payment source | Attending: Emergency Medicine | Admitting: Emergency Medicine

## 2020-03-02 ENCOUNTER — Emergency Department (HOSPITAL_BASED_OUTPATIENT_CLINIC_OR_DEPARTMENT_OTHER): Payer: No Typology Code available for payment source

## 2020-03-02 DIAGNOSIS — Z885 Allergy status to narcotic agent status: Secondary | ICD-10-CM | POA: Insufficient documentation

## 2020-03-02 DIAGNOSIS — I10 Essential (primary) hypertension: Secondary | ICD-10-CM | POA: Diagnosis not present

## 2020-03-02 DIAGNOSIS — Z880 Allergy status to penicillin: Secondary | ICD-10-CM | POA: Diagnosis not present

## 2020-03-02 DIAGNOSIS — Y9389 Activity, other specified: Secondary | ICD-10-CM | POA: Diagnosis not present

## 2020-03-02 DIAGNOSIS — Y92410 Unspecified street and highway as the place of occurrence of the external cause: Secondary | ICD-10-CM | POA: Insufficient documentation

## 2020-03-02 DIAGNOSIS — Z882 Allergy status to sulfonamides status: Secondary | ICD-10-CM | POA: Insufficient documentation

## 2020-03-02 DIAGNOSIS — Z888 Allergy status to other drugs, medicaments and biological substances status: Secondary | ICD-10-CM | POA: Diagnosis not present

## 2020-03-02 DIAGNOSIS — S161XXA Strain of muscle, fascia and tendon at neck level, initial encounter: Secondary | ICD-10-CM | POA: Insufficient documentation

## 2020-03-02 DIAGNOSIS — M25512 Pain in left shoulder: Secondary | ICD-10-CM

## 2020-03-02 DIAGNOSIS — F17210 Nicotine dependence, cigarettes, uncomplicated: Secondary | ICD-10-CM | POA: Insufficient documentation

## 2020-03-02 DIAGNOSIS — Y999 Unspecified external cause status: Secondary | ICD-10-CM | POA: Insufficient documentation

## 2020-03-02 DIAGNOSIS — R52 Pain, unspecified: Secondary | ICD-10-CM | POA: Diagnosis not present

## 2020-03-02 DIAGNOSIS — S4992XA Unspecified injury of left shoulder and upper arm, initial encounter: Secondary | ICD-10-CM | POA: Diagnosis not present

## 2020-03-02 DIAGNOSIS — M25519 Pain in unspecified shoulder: Secondary | ICD-10-CM | POA: Diagnosis not present

## 2020-03-02 DIAGNOSIS — S199XXA Unspecified injury of neck, initial encounter: Secondary | ICD-10-CM | POA: Diagnosis not present

## 2020-03-02 DIAGNOSIS — S299XXA Unspecified injury of thorax, initial encounter: Secondary | ICD-10-CM | POA: Diagnosis not present

## 2020-03-02 MED ORDER — DICLOFENAC SODIUM 1 % EX GEL: 2.0000 g | Freq: Four times a day (QID) | CUTANEOUS | 0 refills | Status: DC
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
## 2020-03-06 DIAGNOSIS — M79672 Pain in left foot: Secondary | ICD-10-CM | POA: Diagnosis not present

## 2020-03-06 DIAGNOSIS — M79671 Pain in right foot: Secondary | ICD-10-CM | POA: Diagnosis not present

## 2020-03-10 DIAGNOSIS — M5136 Other intervertebral disc degeneration, lumbar region: Secondary | ICD-10-CM | POA: Diagnosis not present

## 2020-03-17 DIAGNOSIS — M25562 Pain in left knee: Secondary | ICD-10-CM | POA: Diagnosis not present

## 2020-03-17 DIAGNOSIS — M542 Cervicalgia: Secondary | ICD-10-CM | POA: Diagnosis not present

## 2020-03-17 DIAGNOSIS — M25512 Pain in left shoulder: Secondary | ICD-10-CM | POA: Diagnosis not present

## 2020-03-17 DIAGNOSIS — M25561 Pain in right knee: Secondary | ICD-10-CM | POA: Diagnosis not present

## 2020-03-17 DIAGNOSIS — M545 Low back pain: Secondary | ICD-10-CM | POA: Diagnosis not present

## 2020-03-20 DIAGNOSIS — M79671 Pain in right foot: Secondary | ICD-10-CM | POA: Diagnosis not present

## 2020-03-20 DIAGNOSIS — M79672 Pain in left foot: Secondary | ICD-10-CM | POA: Diagnosis not present

## 2020-03-27 DIAGNOSIS — M5416 Radiculopathy, lumbar region: Secondary | ICD-10-CM | POA: Diagnosis not present

## 2020-03-27 DIAGNOSIS — M47896 Other spondylosis, lumbar region: Secondary | ICD-10-CM | POA: Diagnosis not present

## 2020-03-27 DIAGNOSIS — Z79899 Other long term (current) drug therapy: Secondary | ICD-10-CM | POA: Diagnosis not present

## 2020-03-27 DIAGNOSIS — I10 Essential (primary) hypertension: Secondary | ICD-10-CM | POA: Diagnosis not present

## 2020-03-27 DIAGNOSIS — Z79891 Long term (current) use of opiate analgesic: Secondary | ICD-10-CM | POA: Diagnosis not present

## 2020-03-27 DIAGNOSIS — Z6825 Body mass index (BMI) 25.0-25.9, adult: Secondary | ICD-10-CM | POA: Diagnosis not present

## 2020-03-27 DIAGNOSIS — G894 Chronic pain syndrome: Secondary | ICD-10-CM | POA: Diagnosis not present

## 2020-03-28 DIAGNOSIS — S20212A Contusion of left front wall of thorax, initial encounter: Secondary | ICD-10-CM | POA: Diagnosis not present

## 2020-03-28 DIAGNOSIS — R0789 Other chest pain: Secondary | ICD-10-CM | POA: Diagnosis not present

## 2020-03-28 DIAGNOSIS — Z79899 Other long term (current) drug therapy: Secondary | ICD-10-CM | POA: Diagnosis not present

## 2020-04-01 DIAGNOSIS — F250 Schizoaffective disorder, bipolar type: Secondary | ICD-10-CM | POA: Diagnosis not present

## 2020-04-01 DIAGNOSIS — M47816 Spondylosis without myelopathy or radiculopathy, lumbar region: Secondary | ICD-10-CM | POA: Diagnosis not present

## 2020-04-10 DIAGNOSIS — M79672 Pain in left foot: Secondary | ICD-10-CM | POA: Diagnosis not present

## 2020-04-10 DIAGNOSIS — M79671 Pain in right foot: Secondary | ICD-10-CM | POA: Diagnosis not present

## 2020-04-14 DIAGNOSIS — M25562 Pain in left knee: Secondary | ICD-10-CM | POA: Diagnosis not present

## 2020-04-14 DIAGNOSIS — M25561 Pain in right knee: Secondary | ICD-10-CM | POA: Diagnosis not present

## 2020-04-15 DIAGNOSIS — M5136 Other intervertebral disc degeneration, lumbar region: Secondary | ICD-10-CM | POA: Diagnosis not present

## 2020-04-15 DIAGNOSIS — M5416 Radiculopathy, lumbar region: Secondary | ICD-10-CM | POA: Diagnosis not present

## 2020-04-15 DIAGNOSIS — M4317 Spondylolisthesis, lumbosacral region: Secondary | ICD-10-CM | POA: Diagnosis not present

## 2020-04-15 DIAGNOSIS — Z6825 Body mass index (BMI) 25.0-25.9, adult: Secondary | ICD-10-CM | POA: Diagnosis not present

## 2020-05-28 DIAGNOSIS — L719 Rosacea, unspecified: Secondary | ICD-10-CM | POA: Diagnosis not present

## 2020-06-12 LAB — COMPREHENSIVE METABOLIC PANEL
Albumin: 4.2 (ref 3.5–5.0)
Calcium: 10 (ref 8.7–10.7)
GFR calc Af Amer: 74
GFR calc non Af Amer: 61

## 2020-06-12 LAB — HEPATIC FUNCTION PANEL
ALT: 19 (ref 7–35)
AST: 16 (ref 13–35)

## 2020-06-12 LAB — BASIC METABOLIC PANEL
BUN: 13 (ref 4–21)
CO2: 31 — AB (ref 13–22)
Chloride: 104 (ref 99–108)
Creatinine: 0.9 (ref 0.5–1.1)
Glucose: 84
Potassium: 4.5 (ref 3.4–5.3)
Sodium: 141 (ref 137–147)

## 2020-06-12 LAB — CBC AND DIFFERENTIAL
HCT: 44 (ref 36–46)
Hemoglobin: 15.1 (ref 12.0–16.0)
Platelets: 241 (ref 150–399)
WBC: 7.1

## 2020-06-12 LAB — LIPID PANEL
Cholesterol: 180 (ref 0–200)
HDL: 78 — AB (ref 35–70)
LDL Cholesterol: 83
Triglycerides: 117 (ref 40–160)

## 2020-06-12 LAB — CBC: RBC: 4.77 (ref 3.87–5.11)

## 2020-06-12 LAB — HEMOGLOBIN A1C: Hemoglobin A1C: 5.8

## 2020-06-16 ENCOUNTER — Other Ambulatory Visit: Payer: Self-pay | Admitting: Family Medicine

## 2020-06-16 DIAGNOSIS — R9389 Abnormal findings on diagnostic imaging of other specified body structures: Secondary | ICD-10-CM

## 2020-06-18 DIAGNOSIS — Z9689 Presence of other specified functional implants: Secondary | ICD-10-CM | POA: Insufficient documentation

## 2020-06-25 ENCOUNTER — Telehealth: Payer: Self-pay

## 2020-06-25 NOTE — Telephone Encounter (Signed)
Clearance form done and signed by Dr. Jannifer Franklin along with last office notes. Pt last seen 12/2018 and does not have a current follow up appt. Form fax to Spine& Scoliosis specialist at [phone] twice and confirmed. Contact number for office is [phone].

## 2020-06-30 DIAGNOSIS — R05 Cough: Secondary | ICD-10-CM | POA: Diagnosis not present

## 2020-06-30 DIAGNOSIS — H9203 Otalgia, bilateral: Secondary | ICD-10-CM | POA: Diagnosis not present

## 2020-07-01 ENCOUNTER — Ambulatory Visit
Admission: RE | Admit: 2020-07-01 | Discharge: 2020-07-01 | Disposition: A | Discharge status: Home / Self Care | Payer: Medicare Other | Source: Ambulatory Visit | Attending: Family Medicine | Admitting: Family Medicine

## 2020-07-01 DIAGNOSIS — R918 Other nonspecific abnormal finding of lung field: Secondary | ICD-10-CM | POA: Diagnosis not present

## 2020-07-01 DIAGNOSIS — R9389 Abnormal findings on diagnostic imaging of other specified body structures: Secondary | ICD-10-CM

## 2020-07-14 ENCOUNTER — Emergency Department (HOSPITAL_BASED_OUTPATIENT_CLINIC_OR_DEPARTMENT_OTHER)
Admission: EM | Admit: 2020-07-14 | Discharge: 2020-07-14 | Disposition: A | Discharge status: Home / Self Care | Payer: Medicare Other | Attending: Emergency Medicine | Admitting: Emergency Medicine

## 2020-07-14 ENCOUNTER — Other Ambulatory Visit: Payer: Self-pay

## 2020-07-14 ENCOUNTER — Encounter (HOSPITAL_BASED_OUTPATIENT_CLINIC_OR_DEPARTMENT_OTHER): Payer: Self-pay | Admitting: Emergency Medicine

## 2020-07-14 DIAGNOSIS — F5102 Adjustment insomnia: Secondary | ICD-10-CM | POA: Diagnosis not present

## 2020-07-14 DIAGNOSIS — K219 Gastro-esophageal reflux disease without esophagitis: Secondary | ICD-10-CM | POA: Insufficient documentation

## 2020-07-14 DIAGNOSIS — K589 Irritable bowel syndrome without diarrhea: Secondary | ICD-10-CM | POA: Insufficient documentation

## 2020-07-14 DIAGNOSIS — N951 Menopausal and female climacteric states: Secondary | ICD-10-CM | POA: Insufficient documentation

## 2020-07-14 DIAGNOSIS — E785 Hyperlipidemia, unspecified: Secondary | ICD-10-CM | POA: Insufficient documentation

## 2020-07-14 DIAGNOSIS — F17210 Nicotine dependence, cigarettes, uncomplicated: Secondary | ICD-10-CM | POA: Insufficient documentation

## 2020-07-14 DIAGNOSIS — R05 Cough: Secondary | ICD-10-CM | POA: Diagnosis not present

## 2020-07-14 DIAGNOSIS — J209 Acute bronchitis, unspecified: Secondary | ICD-10-CM | POA: Insufficient documentation

## 2020-07-14 DIAGNOSIS — J40 Bronchitis, not specified as acute or chronic: Secondary | ICD-10-CM

## 2020-07-14 DIAGNOSIS — F419 Anxiety disorder, unspecified: Secondary | ICD-10-CM | POA: Insufficient documentation

## 2020-07-14 DIAGNOSIS — J3489 Other specified disorders of nose and nasal sinuses: Secondary | ICD-10-CM | POA: Insufficient documentation

## 2020-07-14 DIAGNOSIS — R059 Cough, unspecified: Secondary | ICD-10-CM

## 2020-07-14 MED ORDER — PREDNISONE 20 MG PO TABS: ORAL_TABLET | ORAL | 0 refills | Status: DC

## 2020-07-14 MED ORDER — ALBUTEROL SULFATE HFA 108 (90 BASE) MCG/ACT IN AERS: 2.0000 | INHALATION_SPRAY | Freq: Four times a day (QID) | RESPIRATORY_TRACT | 2 refills | Status: AC | PRN

## 2020-07-14 MED ORDER — AEROCHAMBER PLUS FLO-VU MISC: 2 refills | Status: DC

## 2020-07-14 MED ORDER — OMEPRAZOLE 20 MG PO CPDR: 20.0000 mg | DELAYED_RELEASE_CAPSULE | Freq: Two times a day (BID) | ORAL | 0 refills | Status: DC

## 2020-07-14 NOTE — ED Notes (Signed)
ED Provider at bedside. 

## 2020-07-14 NOTE — Discharge Instructions (Signed)
1.  Start taking omeprazole twice daily.  Take your first dose 30 minutes in the morning before you eat anything.  Take your second dose around dinnertime.  Do this for 2 weeks.  After 2 weeks you may go to 1 tablet daily if your symptoms are significantly improved. 2.  Take prednisone as prescribed.  Use your albuterol inhaler every 4-6 hours for the next 3 days.  Use the spacer to improve the inhalation of the medication. 3.  Return to the emergency department if you have any worsening symptoms.  Schedule a recheck with your doctor within 1 to 2 weeks to see if your symptoms are improving and resolving.

## 2020-07-14 NOTE — ED Triage Notes (Signed)
Cough, SOB, sore throat x2 weeks.

## 2020-07-14 NOTE — ED Provider Notes (Signed)
[facility] EMERGENCY DEPARTMENT Provider Note   CSN: [id number] Arrival date & time: 07/14/20  1216     History Chief Complaint  Patient presents with  . Cough    Nichole Delgado is a 69 y.o. female.  HPI Patient reports has had a dry cough for about 2 weeks.  She reports at onset there was some clear sputum but that is resolved.  She has not had any fevers chills or body aches.  She reports she has felt like there is some congestion and pressure in her ears and some additional nasal drainage clear in nature.  She reports she does smoke regularly.  Patient denies she is having any chest pain.  She reports after really harsh coughing episodes she sometimes feels short of breath.  She has complex regional pain syndrome in her legs but has not experienced any new swelling or calf tenderness.  She does use an albuterol inhaler without spacer but has not been getting a lot of relief from cough.  Patient has had Covid vaccine completed in March.  Patient reports she does typically get some bronchitis at least once a year.  She also notes she is occasionally having some symptoms that she considers to be reflux.  A little bit of burning in sensation of something coming up in the back of her throat.  She reports she occasionally takes Tums but does not take other medication routinely for reflux.  She does not take any blood pressure medications.  Patient has a history of lung cancer.  She reports she gets routine CT surveillance of her lungs.  She reports she had a CT scan done last week and was told that it was fine without any concerning findings.    Past Medical History:  Diagnosis Date  . Anxiety   . Cervical spondylosis   . Common migraine with intractable migraine 12/29/2017  . Complex regional pain syndrome I   . HLD (hyperlipidemia)   . IBS (irritable bowel syndrome)   . Migraines   . Tobacco abuse   . Vertigo 07/07/2016    Patient Active Problem List   Diagnosis Date  Noted  . Common migraine with intractable migraine 12/29/2017  . Conductive hearing loss of both ears 08/01/2017  . Ankle pain   . Bilateral headaches   . DDD (degenerative disc disease), lumbosacral   . Left foot drop   . Hypoalbuminemia due to protein-calorie malnutrition ([place])   . Leukocytosis   . Fall 03/26/2017  . Right calcaneal fracture 03/26/2017  . GERD (gastroesophageal reflux disease) 03/26/2017  . HLD (hyperlipidemia)   . Tobacco abuse   . Closed fracture of both ankles   . IBS (irritable bowel syndrome)   . Vertigo 07/07/2016  . Eustachian tube dysfunction, bilateral 05/04/2016  . Symptomatic menopausal or female climacteric states 02/24/2016  . Osteoporosis 02/24/2016  . Osteoarthritis 02/24/2016  . Adjustment insomnia 02/24/2016    Past Surgical History:  Procedure Laterality Date  . CATARACT EXTRACTION Bilateral   . COLONOSCOPY W/ POLYPECTOMY    . FOOT SURGERY Right 2011   hammer toe  . ORIF ANKLE FRACTURE Left 03/27/2017   Procedure: OPEN REDUCTION INTERNAL FIXATION (ORIF) BIMALLEOLAR ANKLE FRACTURE;  Surgeon: Renette Butters, MD;  Location: [facility];  Service: Orthopedics;  Laterality: Left;  . ORIF CALCANEOUS FRACTURE Right 04/05/2017   Procedure: OPEN REDUCTION INTERNAL FIXATION (ORIF) CALCANEOUS AND FIBULA FRACTURE;  Surgeon: Renette Butters, MD;  Location: [facility];  Service: Orthopedics;  Laterality:  Right;  Marland Kitchen PARTIAL HYSTERECTOMY  1988  . SPINAL CORD STIMULATOR INSERTION  08/15/2018     OB History   No obstetric history on file.     Family History  Problem Relation Age of Onset  . Heart attack Father   . Migraines Mother   . Cancer Sister        Pancreatic  . Cancer Brother        Liver  . Migraines Daughter   . Breast cancer Neg Hx     Social History   Tobacco Use  . Smoking status: Current Every Day Smoker    Packs/day: 1.00    Years: 35.00    Pack years: 35.00    Types: Cigarettes  . Smokeless tobacco: Never Used  . Tobacco comment:  started smoking when she was 69 years old  Vaping Use  . Vaping Use: Never used  Substance Use Topics  . Alcohol use: No    Alcohol/week: 0.0 standard drinks    Comment: 1 drink every few months  . Drug use: No    Home Medications Prior to Admission medications   Medication Sig Start Date End Date Taking? Authorizing Provider  acetaminophen-codeine (TYLENOL #3) 300-30 MG tablet Take 1 tablet by mouth every 8 (eight) hours as needed. 09/18/19   [provider]  albuterol (VENTOLIN HFA) 108 (90 Base) MCG/ACT inhaler Inhale 2 puffs into the lungs every 6 (six) hours as needed for wheezing or shortness of breath. 07/14/20   Charlesetta Shanks, MD  Ca Phosphate-Cholecalciferol (CALCIUM 500 + D3) 250-500 MG-UNIT CHEW Chew 1 each by mouth 2 (two) times daily.    [provider]  diclofenac Sodium (VOLTAREN) 1 % GEL Apply 2 g topically 4 (four) times daily. 03/02/20   Long, Wonda Olds, MD  Eluxadoline (VIBERZI) 75 MG TABS Take 1 tablet by mouth 2 (two) times daily.     [provider]  estradiol (CLIMARA - DOSED IN MG/24 HR) 0.0375 mg/24hr patch APPLY 1 PATCH TOPICALLY ONCE A WEEK AS DIRECTED 04/06/19   [provider]  gabapentin (NEURONTIN) 100 MG capsule Take 100 mg by mouth every 4 (four) hours.     [provider]  ibuprofen (ADVIL,MOTRIN) 200 MG tablet Take 400 mg by mouth every 6 (six) hours as needed for mild pain.    [provider]  metroNIDAZOLE (METROGEL) 0.75 % gel Apply 1 application topically 2 (two) times daily as needed (rosacea). Apply to face 05/03/18   [provider]  NONFORMULARY OR COMPOUNDED [place]:  Neuropathy Cream - bupivacaine 1%, doxepin 3%, gabapentin 6%, pentoxifylline 3%, topiramate 1%, apply 1-2 grams to affected area 3-4 times daily. Patient not taking: Reported on 01/01/2019 03/31/18   Trula Slade, DPM  nortriptyline (PAMELOR) 25 MG capsule Take 25 mg by mouth at bedtime.    [provider]  omeprazole (PRILOSEC) 20 MG capsule Take 1 capsule (20 mg total) by mouth 2 (two) times daily before a meal. 07/14/20   Charlesetta Shanks, MD  omeprazole (PRILOSEC) 40 MG capsule Take 40 mg by mouth daily as needed (indigestion).  05/19/16   [provider]  oxyCODONE-acetaminophen (PERCOCET/ROXICET) 5-325 MG tablet Take 1 tablet by mouth every 4 (four) hours as needed for severe pain. 07/24/19   Alroy Bailiff, Margaux, PA-C  perphenazine (TRILAFON) 2 MG tablet Take 2 mg by mouth at bedtime. 07/13/18   [provider]  pravastatin (PRAVACHOL) 40 MG tablet Take 40 mg by mouth at bedtime.  06/15/16   [provider]  predniSONE (DELTASONE) 20 MG tablet 3 tabs po day one, then 2 po daily x 4 days 07/14/20   Charlesetta Shanks, MD  promethazine (PHENERGAN) 25 MG tablet Take 1 tablet (25 mg total) by mouth every 8 (eight) hours as needed for nausea or vomiting. 08/11/18   Kathrynn Ducking, MD  propranolol (INDERAL) 20 MG tablet TAKE 1 TABLET BY MOUTH TWICE A DAY 12/25/18   Kathrynn Ducking, MD  Spacer/Aero-Holding Chambers (AEROCHAMBER PLUS WITH MASK) inhaler put 2 puffs in the chamber.  Exhale completely then inhale slowly and deeply. 07/14/20   Charlesetta Shanks, MD  tapentadol (NUCYNTA) 50 MG tablet Take 1 tablet (50 mg total) by mouth every 6 (six) hours as needed. 10/01/19   Kathrynn Ducking, MD  traMADol (ULTRAM) 50 MG tablet Take 50 mg by mouth 2 (two) times daily as needed for pain. 07/27/18   [provider]  Vitamin D, Ergocalciferol, (DRISDOL) 1.25 MG (50000 UT) CAPS capsule 1 CAPSULE BY MOUTH WEEKLY 06/14/19   [provider]  zolpidem (AMBIEN) 10 MG tablet Take 10 mg by mouth at bedtime as needed for sleep.    [provider]    Allergies    Penicillins, Sulfa antibiotics, Other, Codeine, Keppra [levetiracetam], and Topamax [topiramate]  Review of Systems   Review of Systems 10 systems reviewed and negative except as per HPI Physical  Exam Updated Vital Signs BP (!) 140/78 (BP Location: Right Arm)   Pulse 78   Temp 98.1 F (36.7 C) (Oral)   Resp 16   Ht 4\' 10"  (1.473 m)   Wt 57.9 kg   SpO2 97%   BMI 26.67 kg/m   Physical Exam Constitutional:      Comments: Alert and nontoxic.  Clinically well in appearance.  HENT:     Head: Normocephalic and atraumatic.     Ears:     Comments: Bilateral TMs normal.    Nose:     Comments: Very mild hyperemia of nasal mucosa.    Mouth/Throat:     Mouth: Mucous membranes are moist.     Pharynx: Oropharynx is clear.     Comments: No exudate, no posterior soft tissue swelling. Eyes:     Extraocular Movements: Extraocular movements intact.     Pupils: Pupils are equal, round, and reactive to light.  Cardiovascular:     Rate and Rhythm: Normal rate and regular rhythm.  Pulmonary:     Effort: Pulmonary effort is normal.     Breath sounds: Normal breath sounds.     Comments: Occasional dry cough.  No shortness of breath.  No active wheezing. Abdominal:     General: There is no distension.     Palpations: Abdomen is soft.     Tenderness: There is no abdominal tenderness. There is no guarding.  Musculoskeletal:        General: No swelling. Normal range of motion.     Cervical back: Neck supple.     Right lower leg: No edema.     Left lower leg: No edema.  Skin:    General: Skin is warm and dry.  Neurological:     General: No focal deficit present.     Mental Status: She is oriented to person, place, and time.     Coordination: Coordination normal.  Psychiatric:        Mood and Affect: Mood normal.     ED Results / Procedures / Treatments   Labs (  all labs ordered are listed, but only abnormal results are displayed) Labs Reviewed - No data to display  EKG None  Radiology No results found.  Procedures Procedures (including critical care time)  Medications Ordered in ED Medications - No data to display  ED Course  I have reviewed the triage vital signs and  the nursing notes.  Pertinent labs & imaging results that were available during my care of the patient were reviewed by me and considered in my medical decision making (see chart for details).    MDM Rules/Calculators/A&P                         Patient presents with 2 weeks of persistent dry cough.  She has had a CT scan done within the last week and a half for surveillance of lung cancer changes.  She reports that scan was clear.  At this time I do not feel that she needs repeat imaging.  Her lung fields are clear.  Patient is not hypoxic nor febrile.  Aspirations are nonlabored.  Patient is clinically well in appearance.  With persistent cough for 2 weeks and some intermittent reflux symptoms treated with Tums, will have the patient start twice daily omeprazole.  He is given discharge instructions regarding reflux.  Patient is also a smoker with recurrent bronchitis seasonally.  Will give a short burst of prednisone and patient will continue her albuterol inhaler 2 puffs every 4-6 hours for the next 3 days.  A spacer is included in her prescriptions to maximize her use of the inhaler.  At this time patient stable for discharge.  She is counseled on following up with her PCP in 1 to 2 weeks to see if this is improving her symptoms.  Return precautions reviewed. Final Clinical Impression(s) / ED Diagnoses Final diagnoses:  Cough  Bronchitis    Rx / DC Orders ED Discharge Orders         Ordered    omeprazole (PRILOSEC) 20 MG capsule  2 times daily before meals     Discontinue  Reprint     07/14/20 1527    Spacer/Aero-Holding Chambers (AEROCHAMBER PLUS WITH MASK) inhaler     Discontinue  Reprint     07/14/20 1527    albuterol (VENTOLIN HFA) 108 (90 Base) MCG/ACT inhaler  Every 6 hours PRN     Discontinue  Reprint     07/14/20 1527    predniSONE (DELTASONE) 20 MG tablet     Discontinue  Reprint     07/14/20 1527           Charlesetta Shanks, MD 07/14/20 1535

## 2020-08-05 DIAGNOSIS — R030 Elevated blood-pressure reading, without diagnosis of hypertension: Secondary | ICD-10-CM | POA: Diagnosis not present

## 2020-08-05 DIAGNOSIS — Z6826 Body mass index (BMI) 26.0-26.9, adult: Secondary | ICD-10-CM | POA: Insufficient documentation

## 2020-08-05 DIAGNOSIS — Z9689 Presence of other specified functional implants: Secondary | ICD-10-CM | POA: Diagnosis not present

## 2020-08-05 DIAGNOSIS — G5790 Unspecified mononeuropathy of unspecified lower limb: Secondary | ICD-10-CM | POA: Diagnosis not present

## 2020-08-05 DIAGNOSIS — G90523 Complex regional pain syndrome I of lower limb, bilateral: Secondary | ICD-10-CM | POA: Diagnosis not present

## 2020-08-15 DIAGNOSIS — M532X7 Spinal instabilities, lumbosacral region: Secondary | ICD-10-CM | POA: Diagnosis not present

## 2020-08-15 DIAGNOSIS — M5416 Radiculopathy, lumbar region: Secondary | ICD-10-CM | POA: Diagnosis not present

## 2020-08-15 DIAGNOSIS — M4317 Spondylolisthesis, lumbosacral region: Secondary | ICD-10-CM | POA: Diagnosis not present

## 2020-08-15 DIAGNOSIS — M48062 Spinal stenosis, lumbar region with neurogenic claudication: Secondary | ICD-10-CM | POA: Diagnosis not present

## 2020-08-21 ENCOUNTER — Ambulatory Visit (INDEPENDENT_AMBULATORY_CARE_PROVIDER_SITE_OTHER): Payer: Medicare Other | Admitting: Internal Medicine

## 2020-08-21 ENCOUNTER — Encounter: Payer: Self-pay | Admitting: Internal Medicine

## 2020-08-21 ENCOUNTER — Other Ambulatory Visit: Payer: Self-pay

## 2020-08-21 VITALS — BP 148/79 | HR 71 | Ht <= 58 in | Wt 128.2 lb

## 2020-08-21 DIAGNOSIS — Z720 Tobacco use: Secondary | ICD-10-CM | POA: Diagnosis not present

## 2020-08-21 DIAGNOSIS — I10 Essential (primary) hypertension: Secondary | ICD-10-CM

## 2020-08-21 DIAGNOSIS — E782 Mixed hyperlipidemia: Secondary | ICD-10-CM

## 2020-08-21 MED ORDER — LOSARTAN POTASSIUM 25 MG PO TABS: 25.0000 mg | ORAL_TABLET | Freq: Every day | ORAL | 3 refills | Status: DC

## 2020-08-21 NOTE — Patient Instructions (Signed)
Medication Instructions:  Your physician has recommended you make the following change in your medication:  1.  START Losartan 25 mg taking 1 daily   *If you need a refill on your cardiac medications before your next appointment, please call your pharmacy*   Lab Work: None ordered  If you have labs (blood work) drawn today and your tests are completely normal, you will receive your results only by: Marland Kitchen MyChart Message (if you have MyChart) OR . A paper copy in the mail If you have any lab test that is abnormal or we need to change your treatment, we will call you to review the results.   Testing/Procedures: None ordered   Follow-Up: At [facility], you and your health needs are our priority.  As part of our continuing mission to provide you with exceptional heart care, we have created designated Provider Care Teams.  These Care Teams include your primary Cardiologist (physician) and Advanced Practice Providers (APPs -  Physician Assistants and Nurse Practitioners) who all work together to provide you with the care you need, when you need it.  We recommend signing up for the patient portal called "MyChart".  Sign up information is provided on this After Visit Summary.  MyChart is used to connect with patients for Virtual Visits (Telemedicine).  Patients are able to view lab/test results, encounter notes, upcoming appointments, etc.  Non-urgent messages can be sent to your provider as well.   To learn more about what you can do with MyChart, go to [email or web address].    Your next appointment:   6 month(s)  The format for your next appointment:   In Person  Provider:   K. Mali Hilty, MD   Other Instructions

## 2020-08-21 NOTE — Progress Notes (Signed)
LIPID CLINIC CONSULT NOTE  Chief Complaint:  Manage dyslipidemia  Primary Care Physician: Leighton Ruff, MD  Primary Cardiologist:  No primary care provider on file.  HPI:  Nichole Delgado is a 69 y.o. female who is being seen today for the evaluation of dyslipidemia at the request of Leighton Ruff, MD.  This is a pleasant 69 year old female kindly referred by Dr. Drema Dallas for management of dyslipidemia.  According to [name] Dr. Drema Dallas is going to retire soon.  She has a past medical history significant for dyslipidemia, borderline hypertension, 50-pack-year smoking, anxiety, IBS, migraines and vertiginous symptoms.  She also has dyslipidemia and more recently in June 2021 total cholesterol was 180, HDL 78, LDL 83 and triglycerides 117.  Overall pretty reassuring lipid profile, except she had 2 recent chest CTs to follow-up on nodules and was found to have aortic atherosclerosis and scattered foci of coronary artery calcification.  There was also a small persistent pericardial effusion.  EKG shows sinus rhythm at 67.  She reports she is asymptomatic, denying chest pain or shortness of breath.  PMHx:  Past Medical History:  Diagnosis Date   Anxiety    Cervical spondylosis    Common migraine with intractable migraine 12/29/2017   Complex regional pain syndrome I    HLD (hyperlipidemia)    IBS (irritable bowel syndrome)    Migraines    Tobacco abuse    Vertigo 07/07/2016    Past Surgical History:  Procedure Laterality Date   CATARACT EXTRACTION Bilateral    COLONOSCOPY W/ POLYPECTOMY     FOOT SURGERY Right 2011   hammer toe   ORIF ANKLE FRACTURE Left 03/27/2017   Procedure: OPEN REDUCTION INTERNAL FIXATION (ORIF) BIMALLEOLAR ANKLE FRACTURE;  Surgeon: Renette Butters, MD;  Location: [facility];  Service: Orthopedics;  Laterality: Left;   ORIF CALCANEOUS FRACTURE Right 04/05/2017   Procedure: OPEN REDUCTION INTERNAL FIXATION (ORIF) CALCANEOUS AND FIBULA  FRACTURE;  Surgeon: Renette Butters, MD;  Location: [facility];  Service: Orthopedics;  Laterality: Right;   PARTIAL HYSTERECTOMY  1988   SPINAL CORD STIMULATOR INSERTION  08/15/2018    FAMHx:  Family History  Problem Relation Age of Onset   Heart attack Father    Migraines Mother    Cancer Sister        Pancreatic   Cancer Brother        Liver   Migraines Daughter    Breast cancer Neg Hx     SOCHx:   reports that she has been smoking cigarettes. She has a 35.00 pack-year smoking history. She has never used smokeless tobacco. She reports that she does not drink alcohol and does not use drugs.  ALLERGIES:  Allergies  Allergen Reactions   Penicillins Hives    Has patient had a PCN reaction causing immediate rash, facial/tongue/throat swelling, SOB or lightheadedness with hypotension: No Has patient had a PCN reaction causing severe rash involving mucus membranes or skin necrosis: Yes Has patient had a PCN reaction that required hospitalization No Has patient had a PCN reaction occurring within the last 10 years: No If all of the above answers are "NO", then may proceed with Cephalosporin use.    Sulfa Antibiotics Hives and Rash   Other     States she has allergies to other seizure medications but doesn't know which ones   Codeine Nausea Only   Keppra [Levetiracetam] Rash   Topamax [Topiramate] Nausea Only    Weight loss    ROS: Pertinent  items noted in HPI and remainder of comprehensive ROS otherwise negative.  HOME MEDS: Current Outpatient Medications on File Prior to Visit  Medication Sig Dispense Refill   albuterol (VENTOLIN HFA) 108 (90 Base) MCG/ACT inhaler Inhale 2 puffs into the lungs every 6 (six) hours as needed for wheezing or shortness of breath. 6.7 g 2   Ca Phosphate-Cholecalciferol (CALCIUM 500 + D3) 250-500 MG-UNIT CHEW Chew 1 each by mouth 2 (two) times daily.     dicyclomine (BENTYL) 10 MG capsule      Eluxadoline (VIBERZI) 75 MG TABS Take  1 tablet by mouth 2 (two) times daily.      estradiol (CLIMARA - DOSED IN MG/24 HR) 0.0375 mg/24hr patch APPLY 1 PATCH TOPICALLY ONCE A WEEK AS DIRECTED     ibuprofen (ADVIL,MOTRIN) 200 MG tablet Take 400 mg by mouth every 6 (six) hours as needed for mild pain.     NONFORMULARY OR COMPOUNDED [place]:  Neuropathy Cream - bupivacaine 1%, doxepin 3%, gabapentin 6%, pentoxifylline 3%, topiramate 1%, apply 1-2 grams to affected area 3-4 times daily. 120 each 2   omeprazole (PRILOSEC) 20 MG capsule Take 1 capsule (20 mg total) by mouth 2 (two) times daily before a meal. 60 capsule 0   omeprazole (PRILOSEC) 40 MG capsule Take 40 mg by mouth daily as needed (indigestion).      perphenazine (TRILAFON) 2 MG tablet Take 2 mg by mouth at bedtime.  4   pravastatin (PRAVACHOL) 40 MG tablet Take 40 mg by mouth at bedtime.      promethazine (PHENERGAN) 25 MG tablet Take 1 tablet (25 mg total) by mouth every 8 (eight) hours as needed for nausea or vomiting. 30 tablet 2   traMADol (ULTRAM) 50 MG tablet Take 50 mg by mouth 2 (two) times daily as needed for pain.  1   Vitamin D, Ergocalciferol, (DRISDOL) 1.25 MG (50000 UT) CAPS capsule 1 CAPSULE BY MOUTH WEEKLY     zolpidem (AMBIEN) 10 MG tablet Take 10 mg by mouth at bedtime as needed for sleep.     No current facility-administered medications on file prior to visit.    LABS/IMAGING: No results found for this or any previous visit (from the past 48 hour(s)). No results found.  LIPID PANEL: No results found for: CHOL, TRIG, HDL, CHOLHDL, VLDL, LDLCALC, LDLDIRECT  WEIGHTS: Wt Readings from Last 3 Encounters:  08/21/20 128 lb 3.2 oz (58.2 kg)  07/14/20 127 lb 9.6 oz (57.9 kg)  03/02/20 125 lb (56.7 kg)    VITALS: BP (!) 148/79    Pulse 71    Ht 4\' 10"  (1.473 m)    Wt 128 lb 3.2 oz (58.2 kg)    SpO2 99%    BMI 26.79 kg/m   EXAM: General appearance: alert and no distress Neck: no carotid bruit, no JVD and thyroid not enlarged,  symmetric, no tenderness/mass/nodules Lungs: clear to auscultation bilaterally Heart: regular rate and rhythm, S1, S2 normal, no murmur, click, rub or gallop Abdomen: soft, non-tender; bowel sounds normal; no masses,  no organomegaly Extremities: extremities normal, atraumatic, no cyanosis or edema Pulses: 2+ and symmetric Skin: Skin color, texture, turgor normal. No rashes or lesions Neurologic: Grossly normal Psych: Pleasant  EKG: Normal sinus rhythm at 67, possible left atrial enlargement, low voltage QRS-personally reviewed  ASSESSMENT: 1. 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 Aortic and coronary artery calcification 3. Essential hypertension 4. 50-pack-year smoker  PLAN: 1.   Ms. Froman probably has a degree of  hypertension.  She reports persistently elevated blood pressures in the 140s and today was 148/79.  I recommend starting losartan 25 mg daily as there may be a white coat component to this.  She needs to stop smoking and I highly advised smoking cessation.  She says she has upcoming surgery and will need to stop smoking for that.  Lipids are not too bad with LDL of 80.  Her target likely is less than 70 given her coronary calcifications.  She is on pravastatin.  I like for her to work on diet and we will repeat lipids in 6 months.  If necessary might consider adding ezetimibe at that time.  Thanks again for the kind referral.  Follow-up with me in 6 months.  Pixie Casino, MD, [facility], [place] Director of the Advanced Lipid Disorders &  Cardiovascular Risk Reduction Clinic Diplomate of the American Board of Clinical Lipidology Attending Cardiologist  Direct Dial: [phone]   Fax: [phone]  Website:  [email or web address] Sheri Gatchel 08/21/2020, 10:06 PM

## 2020-08-22 DIAGNOSIS — Z01812 Encounter for preprocedural laboratory examination: Secondary | ICD-10-CM | POA: Diagnosis not present

## 2020-08-22 DIAGNOSIS — M4807 Spinal stenosis, lumbosacral region: Secondary | ICD-10-CM | POA: Diagnosis not present

## 2020-09-08 DIAGNOSIS — M4716 Other spondylosis with myelopathy, lumbar region: Secondary | ICD-10-CM | POA: Diagnosis not present

## 2020-09-08 DIAGNOSIS — Z981 Arthrodesis status: Secondary | ICD-10-CM | POA: Diagnosis not present

## 2020-09-08 DIAGNOSIS — M4316 Spondylolisthesis, lumbar region: Secondary | ICD-10-CM | POA: Diagnosis not present

## 2020-09-08 DIAGNOSIS — G8918 Other acute postprocedural pain: Secondary | ICD-10-CM | POA: Diagnosis not present

## 2020-09-08 DIAGNOSIS — M5116 Intervertebral disc disorders with radiculopathy, lumbar region: Secondary | ICD-10-CM | POA: Diagnosis not present

## 2020-09-08 DIAGNOSIS — Z8781 Personal history of (healed) traumatic fracture: Secondary | ICD-10-CM | POA: Diagnosis not present

## 2020-09-08 DIAGNOSIS — M48062 Spinal stenosis, lumbar region with neurogenic claudication: Secondary | ICD-10-CM | POA: Diagnosis not present

## 2020-09-08 DIAGNOSIS — Z79899 Other long term (current) drug therapy: Secondary | ICD-10-CM | POA: Diagnosis not present

## 2020-09-08 DIAGNOSIS — M4317 Spondylolisthesis, lumbosacral region: Secondary | ICD-10-CM | POA: Diagnosis not present

## 2020-09-08 DIAGNOSIS — M532X7 Spinal instabilities, lumbosacral region: Secondary | ICD-10-CM | POA: Diagnosis not present

## 2020-09-08 DIAGNOSIS — M532X6 Spinal instabilities, lumbar region: Secondary | ICD-10-CM | POA: Diagnosis not present

## 2020-09-08 DIAGNOSIS — M4326 Fusion of spine, lumbar region: Secondary | ICD-10-CM | POA: Diagnosis not present

## 2020-09-08 DIAGNOSIS — M4726 Other spondylosis with radiculopathy, lumbar region: Secondary | ICD-10-CM | POA: Diagnosis not present

## 2020-09-09 DIAGNOSIS — M4319 Spondylolisthesis, multiple sites in spine: Secondary | ICD-10-CM | POA: Diagnosis not present

## 2020-09-09 DIAGNOSIS — M4326 Fusion of spine, lumbar region: Secondary | ICD-10-CM | POA: Diagnosis not present

## 2020-09-09 DIAGNOSIS — M4716 Other spondylosis with myelopathy, lumbar region: Secondary | ICD-10-CM | POA: Diagnosis not present

## 2020-09-09 DIAGNOSIS — M4317 Spondylolisthesis, lumbosacral region: Secondary | ICD-10-CM | POA: Diagnosis not present

## 2020-09-09 DIAGNOSIS — M5116 Intervertebral disc disorders with radiculopathy, lumbar region: Secondary | ICD-10-CM | POA: Diagnosis not present

## 2020-09-09 DIAGNOSIS — M4726 Other spondylosis with radiculopathy, lumbar region: Secondary | ICD-10-CM | POA: Diagnosis not present

## 2020-09-09 DIAGNOSIS — M4316 Spondylolisthesis, lumbar region: Secondary | ICD-10-CM | POA: Diagnosis not present

## 2020-09-09 DIAGNOSIS — Z9889 Other specified postprocedural states: Secondary | ICD-10-CM | POA: Diagnosis not present

## 2020-09-09 DIAGNOSIS — Z981 Arthrodesis status: Secondary | ICD-10-CM | POA: Diagnosis not present

## 2020-09-09 DIAGNOSIS — M48062 Spinal stenosis, lumbar region with neurogenic claudication: Secondary | ICD-10-CM | POA: Diagnosis not present

## 2020-09-09 LAB — CBC AND DIFFERENTIAL
HCT: 37 (ref 36–46)
Hemoglobin: 12.1 (ref 12.0–16.0)
Platelets: 188 (ref 150–399)
WBC: 12.1

## 2020-09-09 LAB — CBC: RBC: 3.95 (ref 3.87–5.11)

## 2020-10-08 DIAGNOSIS — M48062 Spinal stenosis, lumbar region with neurogenic claudication: Secondary | ICD-10-CM | POA: Diagnosis not present

## 2020-10-20 DIAGNOSIS — Z23 Encounter for immunization: Secondary | ICD-10-CM | POA: Diagnosis not present

## 2020-11-04 DIAGNOSIS — G5790 Unspecified mononeuropathy of unspecified lower limb: Secondary | ICD-10-CM | POA: Diagnosis not present

## 2020-11-04 DIAGNOSIS — G90523 Complex regional pain syndrome I of lower limb, bilateral: Secondary | ICD-10-CM | POA: Diagnosis not present

## 2020-11-04 DIAGNOSIS — Z9689 Presence of other specified functional implants: Secondary | ICD-10-CM | POA: Diagnosis not present

## 2020-11-11 DIAGNOSIS — R053 Chronic cough: Secondary | ICD-10-CM | POA: Diagnosis not present

## 2020-11-26 ENCOUNTER — Encounter: Payer: Self-pay | Admitting: Family Medicine

## 2020-11-26 ENCOUNTER — Other Ambulatory Visit: Payer: Self-pay

## 2020-11-26 ENCOUNTER — Ambulatory Visit (INDEPENDENT_AMBULATORY_CARE_PROVIDER_SITE_OTHER): Payer: Medicare Other | Admitting: Family Medicine

## 2020-11-26 ENCOUNTER — Ambulatory Visit: Payer: Medicare Other | Admitting: Family Medicine

## 2020-11-26 VITALS — BP 123/78 | HR 84 | Temp 98.1°F | Ht <= 58 in | Wt 132.2 lb

## 2020-11-26 DIAGNOSIS — F99 Mental disorder, not otherwise specified: Secondary | ICD-10-CM | POA: Diagnosis not present

## 2020-11-26 DIAGNOSIS — N393 Stress incontinence (female) (male): Secondary | ICD-10-CM

## 2020-11-26 DIAGNOSIS — J42 Unspecified chronic bronchitis: Secondary | ICD-10-CM | POA: Diagnosis not present

## 2020-11-26 DIAGNOSIS — K589 Irritable bowel syndrome without diarrhea: Secondary | ICD-10-CM | POA: Diagnosis not present

## 2020-11-26 DIAGNOSIS — K219 Gastro-esophageal reflux disease without esophagitis: Secondary | ICD-10-CM

## 2020-11-26 DIAGNOSIS — F349 Persistent mood [affective] disorder, unspecified: Secondary | ICD-10-CM | POA: Insufficient documentation

## 2020-11-26 DIAGNOSIS — E785 Hyperlipidemia, unspecified: Secondary | ICD-10-CM

## 2020-11-26 DIAGNOSIS — M1990 Unspecified osteoarthritis, unspecified site: Secondary | ICD-10-CM

## 2020-11-26 DIAGNOSIS — G5770 Causalgia of unspecified lower limb: Secondary | ICD-10-CM

## 2020-11-26 DIAGNOSIS — G4700 Insomnia, unspecified: Secondary | ICD-10-CM

## 2020-11-26 DIAGNOSIS — I10 Essential (primary) hypertension: Secondary | ICD-10-CM | POA: Diagnosis not present

## 2020-11-26 DIAGNOSIS — G43909 Migraine, unspecified, not intractable, without status migrainosus: Secondary | ICD-10-CM | POA: Insufficient documentation

## 2020-11-26 DIAGNOSIS — C4491 Basal cell carcinoma of skin, unspecified: Secondary | ICD-10-CM | POA: Diagnosis not present

## 2020-11-26 DIAGNOSIS — Z23 Encounter for immunization: Secondary | ICD-10-CM

## 2020-11-26 DIAGNOSIS — G90529 Complex regional pain syndrome I of unspecified lower limb: Secondary | ICD-10-CM | POA: Insufficient documentation

## 2020-11-26 DIAGNOSIS — N951 Menopausal and female climacteric states: Secondary | ICD-10-CM

## 2020-11-26 DIAGNOSIS — G43809 Other migraine, not intractable, without status migrainosus: Secondary | ICD-10-CM | POA: Diagnosis not present

## 2020-11-26 MED ORDER — PRAVASTATIN SODIUM 40 MG PO TABS: 40.0000 mg | ORAL_TABLET | Freq: Every day | ORAL | 3 refills | Status: DC

## 2020-11-26 NOTE — Assessment & Plan Note (Signed)
Follows with neurology.  No current symptoms.  Usually has once or twice per year.  Does not currently take any abortive therapy.

## 2020-11-26 NOTE — Assessment & Plan Note (Signed)
Discussed Kegel exercises.  Consider referral to pelvic floor rehab if continues to be problematic.

## 2020-11-26 NOTE — Assessment & Plan Note (Signed)
At goal.  Continue losartan 25 mg daily.

## 2020-11-26 NOTE — Progress Notes (Signed)
Nichole Delgado is a 69 y.o. female who presents today for an office visit she is a new patient. .  Assessment/Plan:  Chronic Problems Addressed Today: CRPS (complex regional pain syndrome), lower limb with spinal cord simulator Follows with orthopedics and pain medicine. Has spinal stimulator in place and takes tramadol as needed.   IBS (irritable bowel syndrome) Follows with GI - Dr Watt Climes.  On bentyl and viberzi. Also on pamelor 10mg .  Recommended trying peppermint oil.  Chronic bronchitis ([place]) Follows with pulmonology. Uses albuterol as needed.   Emotional disorder Follows with psychiatry - Dr Marchelle Gearing. On perphenazine 2mg  daily.   Menopausal symptoms Follows with OBGYN. On estradiol.   Basal cell carcinoma (BCC) Follows with dermatology.  Had Mohs surgery recently.  Migraine Follows with neurology.  No current symptoms.  Usually has once or twice per year.  Does not currently take any abortive therapy.  Essential hypertension At goal.  Continue losartan 25 mg daily.  Female stress incontinence Discussed Kegel exercises.  Consider referral to pelvic floor rehab if continues to be problematic.  Insomnia Managed by psychiatry.  Takes Ambien nightly as needed.  Osteoarthritis Continue management per orthopedics.  GERD (gastroesophageal reflux disease) Follows with GI.  On omeprazole 20 mg twice daily.  Tolerating well.  Dyslipidemia Recently had labs.  Will obtain records from previous PCP.  Continue pravastatin 40 mg daily.  Check lipids with next blood draw.     Subjective:  HPI:  See A/p for status of chronic conditions.  She is here today as a new patient to establish care.  ROS: Per HPI, otherwise a complete review of systems was negative.   PMH:  The following were reviewed and entered/updated in epic: Past Medical History:  Diagnosis Date  . Anxiety   . Cervical spondylosis   . Common migraine with intractable migraine 12/29/2017  . Complex  regional pain syndrome I   . HLD (hyperlipidemia)   . IBS (irritable bowel syndrome)   . Migraines   . Tobacco abuse   . Vertigo 07/07/2016   Patient Active Problem List   Diagnosis Date Noted  . Female stress incontinence 11/26/2020  . CRPS (complex regional pain syndrome), lower limb with spinal cord simulator 11/26/2020  . Essential hypertension 11/26/2020  . Chronic bronchitis ([place]) 11/26/2020  . Emotional disorder 11/26/2020  . Menopausal symptoms 11/26/2020  . Migraine 11/26/2020  . Basal cell carcinoma (BCC) 11/26/2020  . DDD (degenerative disc disease), lumbosacral   . Left foot drop   . GERD (gastroesophageal reflux disease) 03/26/2017  . Dyslipidemia   . Tobacco abuse   . IBS (irritable bowel syndrome)   . Osteoporosis 02/24/2016  . Osteoarthritis 02/24/2016  . Insomnia 02/24/2016   Past Surgical History:  Procedure Laterality Date  . CATARACT EXTRACTION Bilateral   . COLONOSCOPY W/ POLYPECTOMY    . FOOT SURGERY Right 2011   hammer toe  . ORIF ANKLE FRACTURE Left 03/27/2017   Procedure: OPEN REDUCTION INTERNAL FIXATION (ORIF) BIMALLEOLAR ANKLE FRACTURE;  Surgeon: Renette Butters, MD;  Location: [facility];  Service: Orthopedics;  Laterality: Left;  . ORIF CALCANEOUS FRACTURE Right 04/05/2017   Procedure: OPEN REDUCTION INTERNAL FIXATION (ORIF) CALCANEOUS AND FIBULA FRACTURE;  Surgeon: Renette Butters, MD;  Location: [facility];  Service: Orthopedics;  Laterality: Right;  . PARTIAL HYSTERECTOMY  1988  . SPINAL CORD STIMULATOR INSERTION  08/15/2018    Family History  Problem Relation Age of Onset  . Heart attack Father   . Migraines  Mother   . Cancer Sister        Pancreatic  . Cancer Brother        Liver  . Migraines Daughter   . Breast cancer Neg Hx     Medications- reviewed and updated Current Outpatient Medications  Medication Sig Dispense Refill  . albuterol (VENTOLIN HFA) 108 (90 Base) MCG/ACT inhaler Inhale 2 puffs into the lungs every 6 (six) hours as  needed for wheezing or shortness of breath. 6.7 g 2  . Ca Phosphate-Cholecalciferol (CALCIUM 500 + D3) 250-500 MG-UNIT CHEW Chew 1 each by mouth 2 (two) times daily.    Marland Kitchen dicyclomine (BENTYL) 10 MG capsule     . Eluxadoline (VIBERZI) 75 MG TABS Take 1 tablet by mouth 2 (two) times daily.     Marland Kitchen estradiol (CLIMARA - DOSED IN MG/24 HR) 0.0375 mg/24hr patch APPLY 1 PATCH TOPICALLY ONCE A WEEK AS DIRECTED    . ibuprofen (ADVIL,MOTRIN) 200 MG tablet Take 400 mg by mouth every 6 (six) hours as needed for mild pain.    Marland Kitchen loperamide (IMODIUM) 2 MG capsule Take by mouth as needed.    . NONFORMULARY OR COMPOUNDED [place]:  Neuropathy Cream - bupivacaine 1%, doxepin 3%, gabapentin 6%, pentoxifylline 3%, topiramate 1%, apply 1-2 grams to affected area 3-4 times daily. 120 each 2  . nortriptyline (PAMELOR) 10 MG capsule Take 10 mg by mouth at bedtime.    Marland Kitchen omeprazole (PRILOSEC) 20 MG capsule Take 1 capsule (20 mg total) by mouth 2 (two) times daily before a meal. 60 capsule 0  . perphenazine (TRILAFON) 2 MG tablet Take 2 mg by mouth at bedtime.  4  . pravastatin (PRAVACHOL) 40 MG tablet Take 1 tablet (40 mg total) by mouth at bedtime. 90 tablet 3  . traMADol (ULTRAM) 50 MG tablet Take 50 mg by mouth 2 (two) times daily as needed for pain.  1  . Vitamin D, Ergocalciferol, (DRISDOL) 1.25 MG (50000 UT) CAPS capsule 1 CAPSULE BY MOUTH WEEKLY    . zolpidem (AMBIEN) 10 MG tablet Take 10 mg by mouth at bedtime as needed for sleep.    Marland Kitchen losartan (COZAAR) 25 MG tablet Take 1 tablet (25 mg total) by mouth daily. 90 tablet 3   No current facility-administered medications for this visit.    Allergies-reviewed and updated Allergies  Allergen Reactions  . Penicillins Hives    Has patient had a PCN reaction causing immediate rash, facial/tongue/throat swelling, SOB or lightheadedness with hypotension: No Has patient had a PCN reaction causing severe rash involving mucus membranes or skin necrosis:  Yes Has patient had a PCN reaction that required hospitalization No Has patient had a PCN reaction occurring within the last 10 years: No If all of the above answers are "NO", then may proceed with Cephalosporin use.   . Sulfa Antibiotics Hives and Rash  . Other     States she has allergies to other seizure medications but doesn't know which ones  . Codeine Nausea Only  . Keppra [Levetiracetam] Rash  . Topamax [Topiramate] Nausea Only    Weight loss    Social History   Socioeconomic History  . Marital status: Married    Spouse name: Not on file  . Number of children: 1  . Years of education: 42  . Highest education level: Not on file  Occupational History  . Occupation: N/A  Tobacco Use  . Smoking status: Current Every Day Smoker    Packs/day: 1.00  Years: 35.00    Pack years: 35.00    Types: Cigarettes  . Smokeless tobacco: Never Used  . Tobacco comment: started smoking when she was 69 years old  Vaping Use  . Vaping Use: Never used  Substance and Sexual Activity  . Alcohol use: No    Alcohol/week: 0.0 standard drinks    Comment: 1 drink every few months  . Drug use: No  . Sexual activity: Not on file  Other Topics Concern  . Not on file  Social History Narrative   Lives at home with her husband   Right-handed   Drinks about 5 cups of coffee per day   Social Determinants of Health   Financial Resource Strain:   . Difficulty of Paying Living Expenses: Not on file  Food Insecurity:   . Worried About Charity fundraiser in the Last Year: Not on file  . Ran Out of Food in the Last Year: Not on file  Transportation Needs:   . Lack of Transportation (Medical): Not on file  . Lack of Transportation (Non-Medical): Not on file  Physical Activity:   . Days of Exercise per Week: Not on file  . Minutes of Exercise per Session: Not on file  Stress:   . Feeling of Stress : Not on file  Social Connections:   . Frequency of Communication with Friends and Family: Not  on file  . Frequency of Social Gatherings with Friends and Family: Not on file  . Attends Religious Services: Not on file  . Active Member of Clubs or Organizations: Not on file  . Attends Archivist Meetings: Not on file  . Marital Status: Not on file           Objective:  Physical Exam: BP 123/78   Pulse 84   Temp 98.1 F (36.7 C) (Temporal)   Ht 4\' 10"  (1.473 m)   Wt 132 lb 3.2 oz (60 kg)   SpO2 95%   BMI 27.63 kg/m   Gen: No acute distress, resting comfortably CV: Regular rate and rhythm with no murmurs appreciated Pulm: Normal work of breathing, clear to auscultation bilaterally with no crackles, wheezes, or rhonchi Neuro: Grossly normal, moves all extremities Psych: Normal affect and thought content  Time Spent: 70 minutes of total time was spent on the date of the encounter performing the following actions: chart review prior to seeing the patient, obtaining history, performing a medically necessary exam, counseling on the treatment plan, placing orders, and documenting in our EHR.        Algis Greenhouse. Jerline Pain, MD 11/26/2020 9:59 AM

## 2020-11-26 NOTE — Assessment & Plan Note (Signed)
Follows with dermatology.  Had Mohs surgery recently.

## 2020-11-26 NOTE — Assessment & Plan Note (Signed)
Follows with GI.  On omeprazole 20 mg twice daily.  Tolerating well.

## 2020-11-26 NOTE — Assessment & Plan Note (Signed)
Managed by psychiatry.  Takes Ambien nightly as needed.

## 2020-11-26 NOTE — Assessment & Plan Note (Addendum)
Follows with OBGYN. On estradiol.

## 2020-11-26 NOTE — Assessment & Plan Note (Signed)
Follows with pulmonology. Uses albuterol as needed.

## 2020-11-26 NOTE — Assessment & Plan Note (Signed)
Recently had labs.  Will obtain records from previous PCP.  Continue pravastatin 40 mg daily.  Check lipids with next blood draw.

## 2020-11-26 NOTE — Assessment & Plan Note (Signed)
Follows with orthopedics and pain medicine. Has spinal stimulator in place and takes tramadol as needed.

## 2020-11-26 NOTE — Assessment & Plan Note (Addendum)
Follows with psychiatry - Dr Marchelle Gearing. On perphenazine 2mg  daily.

## 2020-11-26 NOTE — Assessment & Plan Note (Signed)
Continue management per orthopedics. 

## 2020-11-26 NOTE — Assessment & Plan Note (Addendum)
Follows with GI - Dr Watt Climes.  On bentyl and viberzi. Also on pamelor 10mg .  Recommended trying peppermint oil.

## 2020-11-26 NOTE — Patient Instructions (Signed)
It was very nice to see you today!  Please try working on the Kegel exercises for your urinary incontinence.  Let me know if not improving and I can refer you to see a physical therapist.  Please try taking peppermint oil for your IBS.  We will give you a flu vaccine today.  I will see you back in about 8 months for your annual checkup with blood work.  Please come back to see me sooner if needed.  Take care, Dr Jerline Pain  Please try these tips to maintain a healthy lifestyle:   Eat at least 3 REAL meals and 1-2 snacks per day.  Aim for no more than 5 hours between eating.  If you eat breakfast, please do so within one hour of getting up.    Each meal should contain half fruits/vegetables, one quarter protein, and one quarter carbs (no bigger than a computer mouse)   Cut down on sweet beverages. This includes juice, soda, and sweet tea.     Drink at least 1 glass of water with each meal and aim for at least 8 glasses per day   Exercise at least 150 minutes every week.    Kegel Exercises  Kegel exercises can help strengthen your pelvic floor muscles. The pelvic floor is a group of muscles that support your rectum, small intestine, and bladder. In females, pelvic floor muscles also help support the womb (uterus). These muscles help you control the flow of urine and stool. Kegel exercises are painless and simple, and they do not require any equipment. Your provider may suggest Kegel exercises to:  Improve bladder and bowel control.  Improve sexual response.  Improve weak pelvic floor muscles after surgery to remove the uterus (hysterectomy) or pregnancy (females).  Improve weak pelvic floor muscles after prostate gland removal or surgery (males). Kegel exercises involve squeezing your pelvic floor muscles, which are the same muscles you squeeze when you try to stop the flow of urine or keep from passing gas. The exercises can be done while sitting, standing, or lying down, but  it is best to vary your position. Exercises How to do Kegel exercises: 1. Squeeze your pelvic floor muscles tight. You should feel a tight lift in your rectal area. If you are a female, you should also feel a tightness in your vaginal area. Keep your stomach, buttocks, and legs relaxed. 2. Hold the muscles tight for up to 10 seconds. 3. Breathe normally. 4. Relax your muscles. 5. Repeat as told by your health care provider. Repeat this exercise daily as told by your health care provider. Continue to do this exercise for at least 4-6 weeks, or for as long as told by your health care provider. You may be referred to a physical therapist who can help you learn more about how to do Kegel exercises. Depending on your condition, your health care provider may recommend:  Varying how long you squeeze your muscles.  Doing several sets of exercises every day.  Doing exercises for several weeks.  Making Kegel exercises a part of your regular exercise routine. This information is not intended to replace advice given to you by your health care provider. Make sure you discuss any questions you have with your health care provider. Document Revised: 07/26/2018 Document Reviewed: 07/26/2018 Elsevier Patient Education  [place].

## 2020-12-01 ENCOUNTER — Other Ambulatory Visit: Payer: Self-pay | Admitting: Family Medicine

## 2020-12-01 DIAGNOSIS — Z1231 Encounter for screening mammogram for malignant neoplasm of breast: Secondary | ICD-10-CM

## 2020-12-03 DIAGNOSIS — M170 Bilateral primary osteoarthritis of knee: Secondary | ICD-10-CM | POA: Diagnosis not present

## 2020-12-31 DIAGNOSIS — R42 Dizziness and giddiness: Secondary | ICD-10-CM | POA: Diagnosis not present

## 2020-12-31 DIAGNOSIS — G43809 Other migraine, not intractable, without status migrainosus: Secondary | ICD-10-CM | POA: Diagnosis not present

## 2020-12-31 DIAGNOSIS — R112 Nausea with vomiting, unspecified: Secondary | ICD-10-CM | POA: Diagnosis not present

## 2021-01-03 ENCOUNTER — Other Ambulatory Visit: Payer: Self-pay | Admitting: *Deleted

## 2021-01-03 ENCOUNTER — Encounter: Payer: Self-pay | Admitting: Family Medicine

## 2021-01-07 ENCOUNTER — Encounter: Payer: Self-pay | Admitting: Family Medicine

## 2021-01-14 ENCOUNTER — Other Ambulatory Visit: Payer: Self-pay

## 2021-01-14 ENCOUNTER — Ambulatory Visit
Admission: RE | Admit: 2021-01-14 | Discharge: 2021-01-14 | Disposition: A | Discharge status: Home / Self Care | Payer: Medicare Other | Source: Ambulatory Visit | Attending: Family Medicine | Admitting: Family Medicine

## 2021-01-14 DIAGNOSIS — Z1231 Encounter for screening mammogram for malignant neoplasm of breast: Secondary | ICD-10-CM | POA: Diagnosis not present

## 2021-01-16 DIAGNOSIS — R112 Nausea with vomiting, unspecified: Secondary | ICD-10-CM | POA: Diagnosis not present

## 2021-01-16 DIAGNOSIS — R42 Dizziness and giddiness: Secondary | ICD-10-CM | POA: Diagnosis not present

## 2021-01-21 DIAGNOSIS — R29898 Other symptoms and signs involving the musculoskeletal system: Secondary | ICD-10-CM | POA: Diagnosis not present

## 2021-01-21 DIAGNOSIS — M542 Cervicalgia: Secondary | ICD-10-CM | POA: Diagnosis not present

## 2021-01-21 DIAGNOSIS — Z981 Arthrodesis status: Secondary | ICD-10-CM | POA: Diagnosis not present

## 2021-01-21 DIAGNOSIS — H8190 Unspecified disorder of vestibular function, unspecified ear: Secondary | ICD-10-CM | POA: Diagnosis not present

## 2021-02-16 DIAGNOSIS — F250 Schizoaffective disorder, bipolar type: Secondary | ICD-10-CM | POA: Diagnosis not present

## 2021-02-18 ENCOUNTER — Telehealth: Payer: Self-pay

## 2021-02-18 DIAGNOSIS — G90523 Complex regional pain syndrome I of lower limb, bilateral: Secondary | ICD-10-CM | POA: Diagnosis not present

## 2021-02-18 DIAGNOSIS — Z9689 Presence of other specified functional implants: Secondary | ICD-10-CM | POA: Diagnosis not present

## 2021-02-18 DIAGNOSIS — G5790 Unspecified mononeuropathy of unspecified lower limb: Secondary | ICD-10-CM | POA: Diagnosis not present

## 2021-02-18 NOTE — Telephone Encounter (Signed)
Patient would like to have her blood work results explained to her from 08/21/20 from [facility] to make sure she understands them

## 2021-02-18 NOTE — Telephone Encounter (Signed)
LVM to schedule appointment with PCP.

## 2021-03-02 DIAGNOSIS — H04123 Dry eye syndrome of bilateral lacrimal glands: Secondary | ICD-10-CM | POA: Diagnosis not present

## 2021-03-02 DIAGNOSIS — H3500 Unspecified background retinopathy: Secondary | ICD-10-CM | POA: Diagnosis not present

## 2021-03-02 DIAGNOSIS — G43909 Migraine, unspecified, not intractable, without status migrainosus: Secondary | ICD-10-CM | POA: Diagnosis not present

## 2021-03-02 DIAGNOSIS — H52203 Unspecified astigmatism, bilateral: Secondary | ICD-10-CM | POA: Diagnosis not present

## 2021-03-04 DIAGNOSIS — L700 Acne vulgaris: Secondary | ICD-10-CM | POA: Diagnosis not present

## 2021-03-04 DIAGNOSIS — L309 Dermatitis, unspecified: Secondary | ICD-10-CM | POA: Diagnosis not present

## 2021-03-04 DIAGNOSIS — L719 Rosacea, unspecified: Secondary | ICD-10-CM | POA: Diagnosis not present

## 2021-03-26 DIAGNOSIS — Z6826 Body mass index (BMI) 26.0-26.9, adult: Secondary | ICD-10-CM | POA: Diagnosis not present

## 2021-03-26 DIAGNOSIS — I10 Essential (primary) hypertension: Secondary | ICD-10-CM | POA: Diagnosis not present

## 2021-03-26 DIAGNOSIS — M4327 Fusion of spine, lumbosacral region: Secondary | ICD-10-CM | POA: Diagnosis not present

## 2021-03-31 ENCOUNTER — Ambulatory Visit (INDEPENDENT_AMBULATORY_CARE_PROVIDER_SITE_OTHER): Payer: Medicare Other | Admitting: Family Medicine

## 2021-03-31 ENCOUNTER — Encounter: Payer: Self-pay | Admitting: Family Medicine

## 2021-03-31 ENCOUNTER — Other Ambulatory Visit: Payer: Self-pay

## 2021-03-31 VITALS — BP 120/76 | HR 84 | Temp 97.6°F | Wt 130.8 lb

## 2021-03-31 DIAGNOSIS — R739 Hyperglycemia, unspecified: Secondary | ICD-10-CM | POA: Diagnosis not present

## 2021-03-31 DIAGNOSIS — E041 Nontoxic single thyroid nodule: Secondary | ICD-10-CM

## 2021-03-31 DIAGNOSIS — N393 Stress incontinence (female) (male): Secondary | ICD-10-CM

## 2021-03-31 DIAGNOSIS — D649 Anemia, unspecified: Secondary | ICD-10-CM | POA: Diagnosis not present

## 2021-03-31 DIAGNOSIS — Z1159 Encounter for screening for other viral diseases: Secondary | ICD-10-CM

## 2021-03-31 NOTE — Patient Instructions (Signed)
It was very nice to see you today!  Blood work today.  I will also place a referral for you to see an endocrinologist for your thyroid nodules.  I will send our information over to [facility] urology as well.  We will touch base once we get your blood work back.  Take care, Dr Jerline Pain  PLEASE NOTE:  If you had any lab tests please let us know if you have not heard back within a few days. You may see your results on mychart before we have a chance to review them but we will give you a call once they are reviewed by [place]. If we ordered any referrals today, please let us know if you have not heard from their office within the next week.   Please try these tips to maintain a healthy lifestyle:   Eat at least 3 REAL meals and 1-2 snacks per day.  Aim for no more than 5 hours between eating.  If you eat breakfast, please do so within one hour of getting up.    Each meal should contain half fruits/vegetables, one quarter protein, and one quarter carbs (no bigger than a computer mouse)   Cut down on sweet beverages. This includes juice, soda, and sweet tea.     Drink at least 1 glass of water with each meal and aim for at least 8 glasses per day   Exercise at least 150 minutes every week.

## 2021-03-31 NOTE — Assessment & Plan Note (Signed)
No improvement with exercises.  We will place referral to urology.

## 2021-03-31 NOTE — Progress Notes (Signed)
   Nichole Delgado is a 70 y.o. female who presents today for an office visit.  Assessment/Plan:  Chronic Problems Addressed Today: Female stress incontinence No improvement with exercises.  We will place referral to urology.  Thyroid Cysts Check TSH and refer to endocrinology.  Low Hemoglobin Check CBC and CMET  Hyperglycemia Check A1c  Preventative Healthcare Check Hep C antibody.     Subjective:  HPI:  Patient here for follow-up.  Since her last visit she was seen by neurology for migraines.  Apparently had CT scan done that showed thyroid cyst.  She was also shown to be slightly anemic on last blood work done last summer.  She would like to have this rechecked.  She is also concerned about possible diabetes based on what her ophthalmologist has told her.  She would like to have this checked today as well.       Objective:  Physical Exam: BP 120/76   Pulse 84   Temp 97.6 F (36.4 C) (Temporal)   Wt 130 lb 12.8 oz (59.3 kg)   SpO2 99%   BMI 27.34 kg/m   Gen: No acute distress, resting comfortably CV: Regular rate and rhythm with no murmurs appreciated Pulm: Normal work of breathing, clear to auscultation bilaterally with no crackles, wheezes, or rhonchi Neuro: Grossly normal, moves all extremities Psych: Normal affect and thought content      Rowan Pollman M. Jerline Pain, MD 03/31/2021 3:16 PM

## 2021-04-01 LAB — COMPREHENSIVE METABOLIC PANEL
ALT: 18 U/L (ref 0–35)
AST: 17 U/L (ref 0–37)
Albumin: 4.1 g/dL (ref 3.5–5.2)
Alkaline Phosphatase: 69 U/L (ref 39–117)
BUN: 11 mg/dL (ref 6–23)
CO2: 28 mEq/L (ref 19–32)
Calcium: 9.7 mg/dL (ref 8.4–10.5)
Chloride: 104 mEq/L (ref 96–112)
Creatinine, Ser: 0.9 mg/dL (ref 0.40–1.20)
GFR: 64.22 mL/min (ref >60.00)
Glucose, Bld: 78 mg/dL (ref 70–99)
Potassium: 4.7 mEq/L (ref 3.5–5.1)
Sodium: 141 mEq/L (ref 135–145)
Total Bilirubin: 0.3 mg/dL (ref 0.2–1.2)
Total Protein: 6.4 g/dL (ref 6.0–8.3)

## 2021-04-01 LAB — CBC
HCT: 40.8 % (ref 36.0–46.0)
Hemoglobin: 13.8 g/dL (ref 12.0–15.0)
MCHC: 33.9 g/dL (ref 30.0–36.0)
MCV: 89.8 fl (ref 78.0–100.0)
Platelets: 266 10*3/uL (ref 150.0–400.0)
RBC: 4.54 Mil/uL (ref 3.87–5.11)
RDW: 15 % (ref 11.5–15.5)
WBC: 7.5 10*3/uL (ref 4.0–10.5)

## 2021-04-01 LAB — HEPATITIS C ANTIBODY
Hepatitis C Ab: NONREACTIVE
SIGNAL TO CUT-OFF: 0 (ref <1.00)

## 2021-04-01 LAB — TSH: TSH: 1.98 u[IU]/mL (ref 0.35–4.50)

## 2021-04-01 LAB — HEMOGLOBIN A1C: Hgb A1c MFr Bld: 5.8 % (ref 4.6–6.5)

## 2021-04-02 ENCOUNTER — Telehealth: Payer: Self-pay

## 2021-04-02 NOTE — Progress Notes (Signed)
Please inform patient of the following:  Her A1c is borderline but very close to normal. All of her other labs are NORMAL. Do not need to do any further testing at this point.  Nichole Delgado. Jerline Pain, MD 04/02/2021 8:29 AM

## 2021-04-02 NOTE — Telephone Encounter (Signed)
Pt returned Stella's call about lab results. Please advise.  

## 2021-04-02 NOTE — Telephone Encounter (Signed)
See result note.  

## 2021-04-09 DIAGNOSIS — N393 Stress incontinence (female) (male): Secondary | ICD-10-CM | POA: Diagnosis not present

## 2021-04-09 DIAGNOSIS — N3941 Urge incontinence: Secondary | ICD-10-CM | POA: Diagnosis not present

## 2021-04-20 DIAGNOSIS — D414 Neoplasm of uncertain behavior of bladder: Secondary | ICD-10-CM | POA: Diagnosis not present

## 2021-04-20 DIAGNOSIS — N393 Stress incontinence (female) (male): Secondary | ICD-10-CM | POA: Diagnosis not present

## 2021-04-20 DIAGNOSIS — M170 Bilateral primary osteoarthritis of knee: Secondary | ICD-10-CM | POA: Diagnosis not present

## 2021-04-23 ENCOUNTER — Other Ambulatory Visit: Payer: Self-pay | Admitting: Urology

## 2021-04-24 ENCOUNTER — Other Ambulatory Visit: Payer: Self-pay

## 2021-04-24 ENCOUNTER — Encounter (HOSPITAL_BASED_OUTPATIENT_CLINIC_OR_DEPARTMENT_OTHER): Payer: Self-pay | Admitting: Urology

## 2021-04-24 ENCOUNTER — Other Ambulatory Visit (HOSPITAL_COMMUNITY)
Admission: RE | Admit: 2021-04-24 | Discharge: 2021-04-24 | Disposition: A | Discharge status: Home / Self Care | Payer: Medicare Other | Source: Ambulatory Visit | Attending: Urology | Admitting: Urology

## 2021-04-24 DIAGNOSIS — Z01812 Encounter for preprocedural laboratory examination: Secondary | ICD-10-CM | POA: Diagnosis not present

## 2021-04-24 DIAGNOSIS — Z20822 Contact with and (suspected) exposure to covid-19: Secondary | ICD-10-CM | POA: Diagnosis not present

## 2021-04-24 LAB — SARS CORONAVIRUS 2 (TAT 6-24 HRS): SARS Coronavirus 2: NEGATIVE

## 2021-04-24 NOTE — Progress Notes (Signed)
Spoke w/ via phone for pre-op interview--- PT Lab needs dos----  Istat             Lab results------ current ekg in epic/ chart COVID test ------ done 04-24-2021 result in epic  Arrive at -------  0530 on 04-28-2021 NPO after MN NO Solid Food.  Clear liquids from MN until--- 0430 Med rec completed Medications to take morning of surgery ----- Prilosec , if needed take tramadol Diabetic medication ----- n/a Patient instructed to bring photo id and insurance card day of surgery Patient aware to have Driver (ride ) / caregiver    for 24 hours after surgery-- husband, Nichole Delgado Patient Special Instructions ----- asked to bring rescue inhaler and spinal cord stimulator control with her dos Pre-Op special Istructions ----- n/a Patient verbalized understanding of instructions that were given at this phone interview. Patient denies shortness of breath, chest pain, fever, cough at this phone interview.

## 2021-04-27 ENCOUNTER — Ambulatory Visit (INDEPENDENT_AMBULATORY_CARE_PROVIDER_SITE_OTHER): Payer: Medicare Other | Admitting: Internal Medicine

## 2021-04-27 ENCOUNTER — Other Ambulatory Visit: Payer: Self-pay

## 2021-04-27 ENCOUNTER — Encounter: Payer: Self-pay | Admitting: Internal Medicine

## 2021-04-27 VITALS — BP 114/76 | HR 85 | Ht <= 58 in | Wt 130.6 lb

## 2021-04-27 DIAGNOSIS — E782 Mixed hyperlipidemia: Secondary | ICD-10-CM

## 2021-04-27 DIAGNOSIS — I10 Essential (primary) hypertension: Secondary | ICD-10-CM

## 2021-04-27 DIAGNOSIS — Z720 Tobacco use: Secondary | ICD-10-CM

## 2021-04-27 DIAGNOSIS — I2510 Atherosclerotic heart disease of native coronary artery without angina pectoris: Secondary | ICD-10-CM | POA: Diagnosis not present

## 2021-04-27 NOTE — Progress Notes (Signed)
LIPID CLINIC CONSULT NOTE  Chief Complaint:  Manage dyslipidemia  Primary Care Physician: Vivi Barrack, MD  Primary Cardiologist:  None  HPI:  Nichole Delgado is a 70 y.o. female who is being seen today for the evaluation of dyslipidemia at the request of Vivi Barrack, MD.  This is a pleasant 70 year old female kindly referred by Dr. Drema Dallas for management of dyslipidemia.  According to [name] Dr. Drema Dallas is going to retire soon.  She has a past medical history significant for dyslipidemia, borderline hypertension, 50-pack-year smoking, anxiety, IBS, migraines and vertiginous symptoms.  She also has dyslipidemia and more recently in June 2021 total cholesterol was 180, HDL 78, LDL 83 and triglycerides 117.  Overall pretty reassuring lipid profile, except she had 2 recent chest CTs to follow-up on nodules and was found to have aortic atherosclerosis and scattered foci of coronary artery calcification.  There was also a small persistent pericardial effusion.  EKG shows sinus rhythm at 67.  She reports she is asymptomatic, denying chest pain or shortness of breath.  04/27/2021  [name] returns today for follow-up.  Her blood pressure is now better controlled.  I started losartan 25 mg daily.  Reports that she continues to smoke.  She has an upcoming bladder biopsy.  Her husband apparently also has a history of bladder cancer.  Hopefully this is benign.  Her lipids were higher than ideal although not bad with LDL of 83.  Plan was to work on diet and weight loss and have repeat lipids now at 6 months.  We will go ahead and try to obtain that.  We will try to target LDL less than 70 given her coronary calcification and aortic atherosclerosis.  PMHx:  Past Medical History:  Diagnosis Date  . Cervical spondylosis   . Complex regional pain syndrome I    followed by pain clinic, pcp,  has spinal cord stimulator  . DDD (degenerative disc disease), lumbar   . GAD (generalized anxiety  disorder)   . GERD (gastroesophageal reflux disease)   . History of basal cell carcinoma (BCC) excision    per pt moh's sx on nose in 2020  . History of chronic bronchitis    uses inhaler prn  . Hypertension    followed by pcp and dr Daneli Butkiewicz  . Irritable bowel syndrome with diarrhea    followed by dr Watt Climes (gi)  . Lesion of bladder   . Migraines    neurology-- [facility] in [place]  . Mixed dyslipidemia    followed by lipid clinic--- dr Debara Pickett  . Neuropathy   . OA (osteoarthritis)   . PONV (postoperative nausea and vomiting)    severe  . Pre-diabetes   . 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 SUI (stress urinary incontinence, female)   . Thyroid cyst    04-24-2021  pt stated recently dx and has been referred to endocrinologist  . Vertigo   . Wears glasses     Past Surgical History:  Procedure Laterality Date  . CATARACT EXTRACTION W/ INTRAOCULAR LENS  IMPLANT, BILATERAL  2005  . COLONOSCOPY W/ POLYPECTOMY    . FOOT SURGERY Right 2011   hammer toe  . ORIF ANKLE FRACTURE Left 03/27/2017   Procedure: OPEN REDUCTION INTERNAL FIXATION (ORIF) BIMALLEOLAR ANKLE FRACTURE;  Surgeon: Renette Butters, MD;  Location: [facility];  Service: Orthopedics;  Laterality: Left;  . ORIF CALCANEOUS FRACTURE Right 04/05/2017   Procedure: OPEN REDUCTION INTERNAL FIXATION (  ORIF) CALCANEOUS AND FIBULA FRACTURE;  Surgeon: Renette Butters, MD;  Location: [facility];  Service: Orthopedics;  Laterality: Right;  . POSTERIOR LUMBAR FUSION  09-08-2020  @[facility]    L5--S1  . SPINAL CORD STIMULATOR INSERTION  08/15/2018  . SUPRACERVICAL ABDOMINAL HYSTERECTOMY  1988  . TONSILLECTOMY AND ADENOIDECTOMY  child    FAMHx:  Family History  Problem Relation Age of Onset  . Heart attack Father   . Migraines Mother   . Cancer Sister        Pancreatic  . Cancer Brother        Liver  . Migraines Daughter   . Breast cancer Neg Hx     SOCHx:   reports that she has been smoking cigarettes. She  has a 45.00 pack-year smoking history. She has never used smokeless tobacco. She reports previous alcohol use. She reports that she does not use drugs.  ALLERGIES:  Allergies  Allergen Reactions  . Penicillins Hives    Has patient had a PCN reaction causing immediate rash, facial/tongue/throat swelling, SOB or lightheadedness with hypotension: No Has patient had a PCN reaction causing severe rash involving mucus membranes or skin necrosis: Yes Has patient had a PCN reaction that required hospitalization No Has patient had a PCN reaction occurring within the last 10 years: No If all of the above answers are "NO", then may proceed with Cephalosporin use.   . Sulfa Antibiotics Hives and Rash  . Other     States she has allergies to other seizure medications but doesn't know which ones  . Codeine Nausea Only  . Hydrocodone Rash  . Keppra [Levetiracetam] Rash  . Topamax [Topiramate] Nausea Only    Weight loss    ROS: Pertinent items noted in HPI and remainder of comprehensive ROS otherwise negative.  HOME MEDS: Current Outpatient Medications on File Prior to Visit  Medication Sig Dispense Refill  . albuterol (VENTOLIN HFA) 108 (90 Base) MCG/ACT inhaler Inhale 2 puffs into the lungs every 6 (six) hours as needed for wheezing or shortness of breath. 6.7 g 2  . Azelaic Acid 15 % gel Apply topically daily. After skin is thoroughly washed and patted dry, gently but thoroughly massage a thin film of azelaic acid cream into the affected area twice daily, in the morning and evening.    . Ca Phosphate-Cholecalciferol 250-500 MG-UNIT CHEW Chew 1 each by mouth 2 (two) times daily.    . Calcium Carb-Cholecalciferol (CALCIUM 500 +D PO) Take 1 tablet by mouth daily.    . Cholecalciferol (VITAMIN D3) 50 MCG (2000 UT) TABS Take 1 tablet by mouth daily.    Marland Kitchen dicyclomine (BENTYL) 10 MG capsule Take 10 mg by mouth 3 (three) times daily as needed.    . Eluxadoline (VIBERZI) 75 MG TABS Take 1 tablet by mouth  2 (two) times daily.    Marland Kitchen estradiol (CLIMARA - DOSED IN MG/24 HR) 0.1 mg/24hr patch Place 0.1 mg onto the skin 2 (two) times a week.    . fluticasone (FLONASE) 50 MCG/ACT nasal spray Place 1 spray into both nostrils daily.    Marland Kitchen ibuprofen (ADVIL,MOTRIN) 200 MG tablet Take 400 mg by mouth every 6 (six) hours as needed for mild pain.    Marland Kitchen loperamide (IMODIUM) 2 MG capsule Take 2 mg by mouth as needed.    . meclizine (ANTIVERT) 25 MG tablet Take 25 mg by mouth 3 (three) times daily as needed for dizziness.    . nortriptyline (PAMELOR) 10 MG capsule Take 10  mg by mouth at bedtime.    Marland Kitchen omeprazole (PRILOSEC) 20 MG capsule Take 1 capsule (20 mg total) by mouth 2 (two) times daily before a meal. (Patient taking differently: Take 20 mg by mouth 2 (two) times daily as needed.) 60 capsule 0  . ondansetron (ZOFRAN) 8 MG tablet Take by mouth every 8 (eight) hours as needed for nausea or vomiting.    Marland Kitchen perphenazine (TRILAFON) 2 MG tablet Take 2 mg by mouth at bedtime.    . pravastatin (PRAVACHOL) 40 MG tablet Take 1 tablet (40 mg total) by mouth at bedtime. (Patient taking differently: Take 40 mg by mouth at bedtime.) 90 tablet 3  . traMADol (ULTRAM) 50 MG tablet Take by mouth every 6 (six) hours as needed.    . zolpidem (AMBIEN) 10 MG tablet Take 10 mg by mouth at bedtime as needed for sleep.    Marland Kitchen losartan (COZAAR) 25 MG tablet Take 1 tablet (25 mg total) by mouth daily. (Patient taking differently: Take 25 mg by mouth at bedtime.) 90 tablet 3   No current facility-administered medications on file prior to visit.    LABS/IMAGING: No results found for this or any previous visit (from the past 48 hour(s)). No results found.  LIPID PANEL:    Component Value Date/Time   CHOL 180 06/12/2020 0000   TRIG 117 06/12/2020 0000   HDL 78 (A) 06/12/2020 0000   LDLCALC 83 06/12/2020 0000    WEIGHTS: Wt Readings from Last 3 Encounters:  04/27/21 130 lb 9.6 oz (59.2 kg)  03/31/21 130 lb 12.8 oz (59.3 kg)   11/26/20 132 lb 3.2 oz (60 kg)    VITALS: BP 114/76   Pulse 85   Ht 4\' 10"  (1.473 m)   Wt 130 lb 9.6 oz (59.2 kg)   SpO2 99%   BMI 27.30 kg/m   EXAM: Deferred  EKG: Deferred  ASSESSMENT: 1. 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 Aortic and coronary artery calcification 3. Essential hypertension 4. 50-pack-year smoker  PLAN: 1.   Ms. Heumann has had improvement in her blood pressure with addition of losartan.  Her cholesterol needs to be reassessed with a target LDL less than 70.  We will go ahead and obtain that fasting in the near future.  Ultimately we may need to adjust or add medication.  Unfortunately she continues to smoke.  She is having a bladder biopsy and she understands this is a big risk factor for urologic cancers.  Hopefully she has a negative findings but it is a good opportunity to work on smoking cessation.  Plan follow-up with me annually or sooner as necessary  Pixie Casino, MD, [facility], [place] Director of the Advanced Lipid Disorders &  Cardiovascular Risk Reduction Clinic Diplomate of the American Board of Clinical Lipidology Attending Cardiologist  Direct Dial: [phone]  Fax: [phone]  Website:  [email or web address] Kahdijah Errickson 04/27/2021, 3:05 PM

## 2021-04-27 NOTE — Patient Instructions (Signed)
Medication Instructions:  Your physician recommends that you continue on your current medications as directed. Please refer to the Current Medication list given to you today.  *If you need a refill on your cardiac medications before your next appointment, please call your pharmacy*   Lab Work: FASTING lab work to check cholesterol   If you have labs (blood work) drawn today and your tests are completely normal, you will receive your results only by: Marland Kitchen MyChart Message (if you have MyChart) OR . A paper copy in the mail If you have any lab test that is abnormal or we need to change your treatment, we will call you to review the results.   Testing/Procedures: NONE   Follow-Up: At [facility], you and your health needs are our priority.  As part of our continuing mission to provide you with exceptional heart care, we have created designated Provider Care Teams.  These Care Teams include your primary Cardiologist (physician) and Advanced Practice Providers (APPs -  Physician Assistants and Nurse Practitioners) who all work together to provide you with the care you need, when you need it.  We recommend signing up for the patient portal called "MyChart".  Sign up information is provided on this After Visit Summary.  MyChart is used to connect with patients for Virtual Visits (Telemedicine).  Patients are able to view lab/test results, encounter notes, upcoming appointments, etc.  Non-urgent messages can be sent to your provider as well.   To learn more about what you can do with MyChart, go to [email or web address].    Your next appointment:   12 month(s)  The format for your next appointment:   In Person  Provider:   K. Mali Hilty, MD   Other Instructions

## 2021-05-07 ENCOUNTER — Other Ambulatory Visit: Payer: Self-pay

## 2021-05-07 ENCOUNTER — Encounter (HOSPITAL_BASED_OUTPATIENT_CLINIC_OR_DEPARTMENT_OTHER): Payer: Self-pay | Admitting: Urology

## 2021-05-07 NOTE — Progress Notes (Signed)
Spoke w/ via phone for pre-op interview--- PT Lab needs dos----  Istat             Lab results------ current ekg in epic/ chart COVID test ------ pt asymptomatic no test needed  Arrive at -------  0530 on 05-12-2021 NPO after MN NO Solid Food.  Clear liquids from MN until--- 0430 Med rec completed Medications to take morning of surgery ----- Prilosec , if needed take tramadol Diabetic medication ----- n/a  Patient instructed to bring photo id and insurance card day of surgery Patient aware to have Driver (ride ) / caregiver    for 24 hours after surgery-- husband, Nichole Delgado Patient Special Instructions ----- asked to bring rescue inhaler and spinal cord stimulator control with her dos  Pre-Op special Istructions ----- n/a Patient verbalized understanding of instructions that were given at this phone interview. Patient denies shortness of breath, chest pain, fever, cough at this phone interview.

## 2021-05-08 ENCOUNTER — Other Ambulatory Visit: Payer: Medicare Other

## 2021-05-11 NOTE — Anesthesia Preprocedure Evaluation (Addendum)
Anesthesia Evaluation  Patient identified by MRN, date of birth, ID band Patient awake    Reviewed: Allergy & Precautions, NPO status , Patient's Chart, lab work & pertinent test results  History of Anesthesia Complications (+) PONV  Airway Mallampati: IV  TM Distance: >3 FB Neck ROM: Full   Comment: Small mouth Dental  (+) Teeth Intact, Dental Advisory Given   Pulmonary COPD,  COPD inhaler, Current SmokerPatient did not abstain from smoking.,    breath sounds clear to auscultation       Cardiovascular hypertension, Pt. on medications  Rhythm:Regular Rate:Normal     Neuro/Psych  Headaches, Anxiety DDD with chronic pain Spinal cord stim    GI/Hepatic Neg liver ROS, GERD  Controlled and Medicated,  Endo/Other  negative endocrine ROS  Renal/GU negative Renal ROS     Musculoskeletal  (+) Arthritis , Osteoarthritis,    Abdominal   Peds  Hematology negative hematology ROS (+)   Anesthesia Other Findings   Reproductive/Obstetrics                           Anesthesia Physical Anesthesia Plan  ASA: III  Anesthesia Plan: General   Post-op Pain Management:    Induction: Intravenous  PONV Risk Score and Plan: 3 and Ondansetron and Dexamethasone  Airway Management Planned: LMA  Additional Equipment: None  Intra-op Plan:   Post-operative Plan:   Informed Consent: I have reviewed the patients History and Physical, chart, labs and discussed the procedure including the risks, benefits and alternatives for the proposed anesthesia with the patient or authorized representative who has indicated his/her understanding and acceptance.     Dental advisory given  Plan Discussed with: CRNA and Surgeon  Anesthesia Plan Comments:        Anesthesia Quick Evaluation

## 2021-05-12 ENCOUNTER — Other Ambulatory Visit: Payer: Self-pay

## 2021-05-12 ENCOUNTER — Encounter (HOSPITAL_BASED_OUTPATIENT_CLINIC_OR_DEPARTMENT_OTHER)
Admission: RE | Disposition: A | Discharge status: Home / Self Care | Payer: Self-pay | Source: Ambulatory Visit | Attending: Urology

## 2021-05-12 ENCOUNTER — Ambulatory Visit (HOSPITAL_BASED_OUTPATIENT_CLINIC_OR_DEPARTMENT_OTHER)
Admission: RE | Admit: 2021-05-12 | Discharge: 2021-05-12 | Disposition: A | Discharge status: Home / Self Care | Payer: Medicare Other | Source: Ambulatory Visit | Attending: Urology | Admitting: Urology

## 2021-05-12 ENCOUNTER — Ambulatory Visit (HOSPITAL_BASED_OUTPATIENT_CLINIC_OR_DEPARTMENT_OTHER): Payer: Medicare Other | Admitting: Anesthesiology

## 2021-05-12 ENCOUNTER — Encounter (HOSPITAL_BASED_OUTPATIENT_CLINIC_OR_DEPARTMENT_OTHER): Payer: Self-pay | Admitting: Urology

## 2021-05-12 DIAGNOSIS — N393 Stress incontinence (female) (male): Secondary | ICD-10-CM | POA: Insufficient documentation

## 2021-05-12 DIAGNOSIS — E785 Hyperlipidemia, unspecified: Secondary | ICD-10-CM | POA: Diagnosis not present

## 2021-05-12 DIAGNOSIS — Z85828 Personal history of other malignant neoplasm of skin: Secondary | ICD-10-CM | POA: Insufficient documentation

## 2021-05-12 DIAGNOSIS — G4700 Insomnia, unspecified: Secondary | ICD-10-CM | POA: Insufficient documentation

## 2021-05-12 DIAGNOSIS — K219 Gastro-esophageal reflux disease without esophagitis: Secondary | ICD-10-CM | POA: Diagnosis not present

## 2021-05-12 DIAGNOSIS — I10 Essential (primary) hypertension: Secondary | ICD-10-CM | POA: Insufficient documentation

## 2021-05-12 DIAGNOSIS — N3289 Other specified disorders of bladder: Secondary | ICD-10-CM | POA: Diagnosis not present

## 2021-05-12 DIAGNOSIS — N3030 Trigonitis without hematuria: Secondary | ICD-10-CM | POA: Diagnosis not present

## 2021-05-12 HISTORY — DX: Polyneuropathy, unspecified: G62.9

## 2021-05-12 HISTORY — DX: Other intervertebral disc degeneration, lumbar region without mention of lumbar back pain or lower extremity pain: M51.369

## 2021-05-12 HISTORY — DX: Prediabetes: R73.03

## 2021-05-12 HISTORY — DX: Generalized anxiety disorder: F41.1

## 2021-05-12 HISTORY — DX: Personal history of other diseases of the respiratory system: Z87.09

## 2021-05-12 HISTORY — DX: Gastro-esophageal reflux disease without esophagitis: K21.9

## 2021-05-12 HISTORY — DX: Essential (primary) hypertension: I10

## 2021-05-12 HISTORY — PX: BLADDER SURGERY: SHX569

## 2021-05-12 HISTORY — DX: Nontoxic single thyroid nodule: E04.1

## 2021-05-12 HISTORY — DX: Irritable bowel syndrome with diarrhea: K58.0

## 2021-05-12 HISTORY — DX: Personal history of other malignant neoplasm of skin: Z85.828

## 2021-05-12 HISTORY — DX: Presence of spectacles and contact lenses: Z97.3

## 2021-05-12 HISTORY — PX: CYSTOSCOPY WITH BIOPSY: SHX5122

## 2021-05-12 HISTORY — DX: Other specified postprocedural states: Z98.890

## 2021-05-12 HISTORY — DX: Other specified postprocedural states: R11.2

## 2021-05-12 HISTORY — DX: Mixed hyperlipidemia: E78.2

## 2021-05-12 HISTORY — DX: Bladder disorder, unspecified: N32.9

## 2021-05-12 HISTORY — DX: Other intervertebral disc degeneration, lumbar region: M51.36

## 2021-05-12 HISTORY — DX: Stress incontinence (female) (male): N39.3

## 2021-05-12 HISTORY — DX: Unspecified osteoarthritis, unspecified site: M19.90

## 2021-05-12 LAB — POCT I-STAT, CHEM 8
BUN: 12 mg/dL (ref 8–23)
Calcium, Ion: 1.25 mmol/L (ref 1.15–1.40)
Chloride: 103 mmol/L (ref 98–111)
Creatinine, Ser: 0.9 mg/dL (ref 0.44–1.00)
Glucose, Bld: 109 mg/dL — ABNORMAL HIGH (ref 70–99)
HCT: 40 % (ref 36.0–46.0)
Hemoglobin: 13.6 g/dL (ref 12.0–15.0)
Potassium: 4 mmol/L (ref 3.5–5.1)
Sodium: 139 mmol/L (ref 135–145)
TCO2: 26 mmol/L (ref 22–32)

## 2021-05-12 SURGERY — CYSTOSCOPY, WITH BIOPSY
Anesthesia: General | Site: Bladder

## 2021-05-12 MED ORDER — FENTANYL CITRATE (PF) 100 MCG/2ML IJ SOLN: 25.0000 ug | INTRAMUSCULAR | Status: DC | PRN

## 2021-05-12 MED ORDER — FENTANYL CITRATE (PF) 100 MCG/2ML IJ SOLN
INTRAMUSCULAR | Status: DC | PRN
  Administered 2021-05-12: 50 ug via INTRAVENOUS

## 2021-05-12 MED ORDER — FENTANYL CITRATE (PF) 100 MCG/2ML IJ SOLN
INTRAMUSCULAR | Status: AC
  Filled 2021-05-12: qty 2

## 2021-05-12 MED ORDER — PROPOFOL 10 MG/ML IV BOLUS
INTRAVENOUS | Status: DC | PRN
  Administered 2021-05-12: 200 mg via INTRAVENOUS

## 2021-05-12 MED ORDER — PROPOFOL 10 MG/ML IV BOLUS
INTRAVENOUS | Status: AC
  Filled 2021-05-12: qty 20

## 2021-05-12 MED ORDER — LIDOCAINE 2% (20 MG/ML) 5 ML SYRINGE
INTRAMUSCULAR | Status: AC
  Filled 2021-05-12: qty 5

## 2021-05-12 MED ORDER — CIPROFLOXACIN IN D5W 400 MG/200ML IV SOLN
INTRAVENOUS | Status: AC
  Filled 2021-05-12: qty 200

## 2021-05-12 MED ORDER — DEXAMETHASONE SODIUM PHOSPHATE 10 MG/ML IJ SOLN
INTRAMUSCULAR | Status: DC | PRN
  Administered 2021-05-12: 10 mg via INTRAVENOUS

## 2021-05-12 MED ORDER — CEFAZOLIN SODIUM-DEXTROSE 2-4 GM/100ML-% IV SOLN: 2.0000 g | INTRAVENOUS | Status: DC

## 2021-05-12 MED ORDER — MIDAZOLAM HCL 5 MG/5ML IJ SOLN
INTRAMUSCULAR | Status: DC | PRN
  Administered 2021-05-12: 1 mg via INTRAVENOUS

## 2021-05-12 MED ORDER — ARTIFICIAL TEARS OPHTHALMIC OINT
TOPICAL_OINTMENT | OPHTHALMIC | Status: AC
  Filled 2021-05-12: qty 3.5

## 2021-05-12 MED ORDER — LACTATED RINGERS IV SOLN: INTRAVENOUS | Status: DC

## 2021-05-12 MED ORDER — PHENYLEPHRINE 40 MCG/ML (10ML) SYRINGE FOR IV PUSH (FOR BLOOD PRESSURE SUPPORT)
PREFILLED_SYRINGE | INTRAVENOUS | Status: DC | PRN
  Administered 2021-05-12: 80 ug via INTRAVENOUS

## 2021-05-12 MED ORDER — STERILE WATER FOR IRRIGATION IR SOLN
Status: DC | PRN
  Administered 2021-05-12: 3000 mL

## 2021-05-12 MED ORDER — CEFAZOLIN SODIUM-DEXTROSE 2-4 GM/100ML-% IV SOLN
INTRAVENOUS | Status: AC
  Filled 2021-05-12: qty 100

## 2021-05-12 MED ORDER — DEXAMETHASONE SODIUM PHOSPHATE 10 MG/ML IJ SOLN
INTRAMUSCULAR | Status: AC
  Filled 2021-05-12: qty 1

## 2021-05-12 MED ORDER — ONDANSETRON HCL 4 MG/2ML IJ SOLN
INTRAMUSCULAR | Status: DC | PRN
  Administered 2021-05-12: 4 mg via INTRAVENOUS

## 2021-05-12 MED ORDER — LIDOCAINE 2% (20 MG/ML) 5 ML SYRINGE
INTRAMUSCULAR | Status: DC | PRN
  Administered 2021-05-12: 60 mg via INTRAVENOUS

## 2021-05-12 MED ORDER — PHENYLEPHRINE 40 MCG/ML (10ML) SYRINGE FOR IV PUSH (FOR BLOOD PRESSURE SUPPORT)
PREFILLED_SYRINGE | INTRAVENOUS | Status: AC
  Filled 2021-05-12: qty 10

## 2021-05-12 MED ORDER — MIDAZOLAM HCL 2 MG/2ML IJ SOLN
INTRAMUSCULAR | Status: AC
  Filled 2021-05-12: qty 2

## 2021-05-12 MED ORDER — ACETAMINOPHEN 500 MG PO TABS
1000.0000 mg | ORAL_TABLET | Freq: Once | ORAL | Status: AC
  Administered 2021-05-12: 1000 mg via ORAL
  Filled 2021-05-12: qty 2

## 2021-05-12 MED ORDER — CIPROFLOXACIN IN D5W 400 MG/200ML IV SOLN
400.0000 mg | Freq: Once | INTRAVENOUS | Status: AC
  Administered 2021-05-12: 400 mg via INTRAVENOUS

## 2021-05-12 MED ORDER — ONDANSETRON HCL 4 MG/2ML IJ SOLN
INTRAMUSCULAR | Status: AC
  Filled 2021-05-12: qty 2

## 2021-05-12 SURGICAL SUPPLY — 22 items
BAG DRAIN URO-CYSTO SKYTR STRL (DRAIN) ×3 IMPLANT
BAG DRN RND TRDRP ANRFLXCHMBR (UROLOGICAL SUPPLIES)
BAG DRN UROCATH (DRAIN) ×2
BAG URINE DRAIN 2000ML AR STRL (UROLOGICAL SUPPLIES) IMPLANT
CATH FOLEY 2WAY SLVR  5CC 18FR (CATHETERS)
CATH FOLEY 2WAY SLVR 5CC 18FR (CATHETERS) IMPLANT
CLOTH BEACON ORANGE TIMEOUT ST (SAFETY) ×3 IMPLANT
ELECT REM PT RETURN 9FT ADLT (ELECTROSURGICAL) ×3
ELECTRODE REM PT RTRN 9FT ADLT (ELECTROSURGICAL) ×2 IMPLANT
GLOVE SURG ENC MOIS LTX SZ6.5 (GLOVE) ×3 IMPLANT
GOWN STRL REUS W/TWL LRG LVL3 (GOWN DISPOSABLE) ×3 IMPLANT
HOLDER FOLEY CATH W/STRAP (MISCELLANEOUS) IMPLANT
IV NS IRRIG 3000ML ARTHROMATIC (IV SOLUTION) ×3 IMPLANT
KIT TURNOVER CYSTO (KITS) ×3 IMPLANT
LOOP CUT BIPOLAR 24F LRG (ELECTROSURGICAL) IMPLANT
MANIFOLD NEPTUNE II (INSTRUMENTS) ×3 IMPLANT
PACK CYSTO (CUSTOM PROCEDURE TRAY) ×3 IMPLANT
SYR TOOMEY IRRIG 70ML (MISCELLANEOUS) ×3
SYRINGE TOOMEY IRRIG 70ML (MISCELLANEOUS) ×2 IMPLANT
TUBE CONNECTING 12X1/4 (SUCTIONS) ×3 IMPLANT
TUBING UROLOGY SET (TUBING) ×3 IMPLANT
WATER STERILE IRR 3000ML UROMA (IV SOLUTION) ×3 IMPLANT

## 2021-05-12 NOTE — Transfer of Care (Signed)
Immediate Anesthesia Transfer of Care Note  Patient: Nichole Delgado  Procedure(s) Performed: CYSTOSCOPY WITH BIOPSY AND FULGERATION (N/A Bladder)  Patient Location: PACU  Anesthesia Type:General  Level of Consciousness: awake, alert , oriented and patient cooperative  Airway & Oxygen Therapy: Patient Spontanous Breathing  Post-op Assessment: Report given to RN and Post -op Vital signs reviewed and stable  Post vital signs: Reviewed and stable  Last Vitals:  Vitals Value Taken Time  BP 137/64 05/12/21 0805  Temp    Pulse 88 05/12/21 0809  Resp 18 05/12/21 0809  SpO2 98 % 05/12/21 0809  Vitals shown include unvalidated device data.  Last Pain:  Vitals:   05/12/21 0642  TempSrc: (P) Oral  PainSc: (P) 6       Patients Stated Pain Goal: (P) 6 (19/91/44 4584)  Complications: No complications documented.

## 2021-05-12 NOTE — Discharge Instructions (Signed)
Post Bladder Surgery Instructions   General instructions:     Your recent bladder surgery requires very little post hospital care but some definite precautions.  Despite the fact that no skin incisions were used, the area around the bladder incisions are raw and covered with scabs to promote healing and prevent bleeding. Certain precautions are needed to insure that the scabs are not disturbed over the next 2-4 weeks while the healing proceeds.  Because the raw surface inside your bladder and the irritating effects of urine you may expect frequency of urination and/or urgency (a stronger desire to urinate) and perhaps even getting up at night more often. This will usually resolve or improve slowly over the healing period. You may see some blood in your urine over the first 6 weeks. Do not be alarmed, even if the urine was clear for a while. Get off your feet and drink lots of fluids until clearing occurs. If you start to pass clots or don't improve call us.  Catheter: (If you are discharged with a catheter.)  1. Keep your catheter secured to your leg at all times with tape or the supplied strap. 2. You may experience leakage of urine around your catheter- as long as the  catheter continues to drain, this is normal.  If your catheter stops draining  go to the ER. 3. You may also have blood in your urine, even after it has been clear for  several days; you may even pass some small blood clots or other material.  This  is normal as well.  If this happens, sit down and drink plenty of water to help  make urine to flush out your bladder.  If the blood in your urine becomes worse  after doing this, contact our office or return to the ER. 4. You may use the leg bag (small bag) during the day, but use the large bag at  night.  Diet:  You may return to your normal diet immediately. Because of the raw surface of your bladder, alcohol, spicy foods, foods high in acid and drinks with caffeine may  cause irritation or frequency and should be used in moderation. To keep your urine flowing freely and avoid constipation, drink plenty of fluids during the day (8-10 glasses). Tip: Avoid cranberry juice because it is very acidic.  Activity:  Your physical activity doesn't need to be restricted. However, if you are very active, you may see some blood in the urine. We suggest that you reduce your activity under the circumstances until the bleeding has stopped.  Bowels:  It is important to keep your bowels regular during the postoperative period. Straining with bowel movements can cause bleeding. A bowel movement every other day is reasonable. Use a mild laxative if needed, such as milk of magnesia 2-3 tablespoons, or 2 Dulcolax tablets. Call if you continue to have problems. If you had been taking narcotics for pain, before, during or after your surgery, you may be constipated. Take a laxative if necessary.    Medication:  You should resume your pre-surgery medications unless told not to.  AZO can help with burning with urination after the procedure. Acetaminophen (tylenol) can be used for pain.   CYSTOSCOPY HOME CARE INSTRUCTIONS  Activity: Rest for the remainder of the day.  Do not drive or operate equipment today.  You may resume normal activities in one to two days as instructed by your physician.   Meals: Drink plenty of liquids and eat light foods such  as gelatin or soup this evening.  You may return to a normal meal plan tomorrow.  Return to Work: You may return to work in one to two days or as instructed by your physician.  Special Instructions / Symptoms: Call your physician if any of these symptoms occur:   -persistent or heavy bleeding  -bleeding which continues after first few urination  -large blood clots that are difficult to pass  -urine stream diminishes or stops completely  -fever equal to or higher than 101 degrees Farenheit.  -cloudy urine with a strong, foul  odor  -severe pain  Females should always wipe from front to back after elimination.  You may feel some burning pain when you urinate.  This should disappear with time.  Applying moist heat to the lower abdomen or a hot tub bath may help relieve the pain. \  Follow-Up / Date of Return Visit to Your Physician:  As instructed Call for an appointment to arrange follow-up.    Post Anesthesia Home Care Instructions  Activity: Get plenty of rest for the remainder of the day. A responsible individual must stay with you for 24 hours following the procedure.  For the next 24 hours, DO NOT: -Drive a car -Paediatric nurse -Drink alcoholic beverages -Take any medication unless instructed by your physician -Make any legal decisions or sign important papers.  Meals: Start with liquid foods such as gelatin or soup. Progress to regular foods as tolerated. Avoid greasy, spicy, heavy foods. If nausea and/or vomiting occur, drink only clear liquids until the nausea and/or vomiting subsides. Call your physician if vomiting continues.  Special Instructions/Symptoms: Your throat may feel dry or sore from the anesthesia or the breathing tube placed in your throat during surgery. If this causes discomfort, gargle with warm salt water. The discomfort should disappear within 24 hours.  If you had a scopolamine patch placed behind your ear for the management of post- operative nausea and/or vomiting:  1. The medication in the patch is effective for 72 hours, after which it should be removed.  Wrap patch in a tissue and discard in the trash. Wash hands thoroughly with soap and water. 2. You may remove the patch earlier than 72 hours if you experience unpleasant side effects which may include dry mouth, dizziness or visual disturbances. 3. Avoid touching the patch. Wash your hands with soap and water after contact with the patch.

## 2021-05-12 NOTE — Op Note (Signed)
PATIENT:  Nichole Delgado  PRE-OPERATIVE DIAGNOSIS: abnormal bladder mucosa  POST-OPERATIVE DIAGNOSIS: abnormal bladder mucosa   PROCEDURE:  Cystoscopy with bladder biopsy and fulguration  SURGEON:  Jacalyn Lefevre, MD  ANESTHESIA:   General  EBL:  Minimal  DRAINS: none  SPECIMEN:  Left lateral bladder biopsy  DISPOSITION OF SPECIMEN:  PATHOLOGY  Indication: 70 year old woman who presented with stress urinary incontinence found to have abnormal bladder mucosa on cystoscopy.  Description of operation: The patient was taken to the operating room and administered general anesthesia. They were then placed on the table and moved to the dorsal lithotomy position after which the genitalia was sterilely prepped and draped. An official timeout was then performed.  A 21 French rigid cystoscope was placed in the urethral meatus and advanced into the bladder under direct visualization.  Inspection of the bladder again reveals erythematous abnormal mucosa just lateral and posterior to the left ureteral orifice.  Cold cup bladder biopsy was then taken of the abnormal area.  Bugbee was then used to fulgurate the abnormal mucosa as well as to achieve hemostasis. Reinspection of the bladder revealed all obvious abnormal mucosa was fulgurated.  Hemostasis was adequate with irrigant turned off.  The bladder was decompressed and the cystoscope was removed.  The patient was awakened from anesthesia and transferred the PACU in stable condition.  PLAN OF CARE: Discharge to home after PACU  PATIENT DISPOSITION:  PACU - hemodynamically stable.

## 2021-05-12 NOTE — Anesthesia Postprocedure Evaluation (Signed)
Anesthesia Post Note  Patient: Nichole Delgado  Procedure(s) Performed: CYSTOSCOPY WITH BIOPSY AND FULGERATION (N/A Bladder)     Patient location during evaluation: PACU Anesthesia Type: General Level of consciousness: awake and alert, patient cooperative and oriented Pain management: pain level controlled Vital Signs Assessment: post-procedure vital signs reviewed and stable Respiratory status: spontaneous breathing, nonlabored ventilation and respiratory function stable Cardiovascular status: blood pressure returned to baseline and stable Postop Assessment: no apparent nausea or vomiting and able to ambulate Anesthetic complications: no   No complications documented.  Last Vitals:  Vitals:   05/12/21 0642 05/12/21 0804  BP: (P) 137/68 137/64  Pulse: (P) 72 82  Resp: (P) 14 16  Temp: (P) 36.9 C (!) 36.3 C  SpO2: (P) 98% 97%    Last Pain:  Vitals:   05/12/21 0804  TempSrc:   PainSc: 0-No pain                 Goldie Dimmer,E. Yosef Krogh

## 2021-05-12 NOTE — H&P (Signed)
/HPI: cc: SUI   04/09/21: 70 year old woman comes in with several year history of stress urinary incontinence. It has now gotten to the point where she has to wear panty liner daily. She leaks with coughing, sneezing or physical activity. She does have occasional urgency with urge incontinence but this is not very bothersome. She denies hematuria, UTIs or kidney stones. She did have an accident 4 years ago requiring back surgery. She has a history of a partial hysterectomy.   04/20/21: Here for pelvic and cysto   Patient Active Problem List   Diagnosis Date Noted  . Female stress incontinence 11/26/2020  . CRPS (complex regional pain syndrome), lower limb with spinal cord simulator 11/26/2020  . Essential hypertension 11/26/2020  . Chronic bronchitis ([place]) 11/26/2020  . Emotional disorder 11/26/2020  . Menopausal symptoms 11/26/2020  . Migraine 11/26/2020  . Basal cell carcinoma (BCC) 11/26/2020  . DDD (degenerative disc disease), lumbosacral   . Left foot drop   . GERD (gastroesophageal reflux disease) 03/26/2017  . Dyslipidemia   . Tobacco abuse   . IBS (irritable bowel syndrome)   . Osteoporosis 02/24/2016  . Osteoarthritis 02/24/2016  . Insomnia 02/24/2016     GU PMH: Stress Incontinence - 04/09/2021 Urge incontinence - 04/09/2021 Urinary Urgency - 04/09/2021    REVIEW OF SYSTEMS:    GU Review Female:   Patient denies burning /pain with urination, have to strain to urinate, hard to postpone urination, stream starts and stops, trouble starting your stream, being pregnant, get up at night to urinate, frequent urination, and leakage of urine.  Gastrointestinal (Upper):   Patient denies nausea, vomiting, and indigestion/ heartburn.  Gastrointestinal (Lower):   Patient denies diarrhea and constipation.  Constitutional:   Patient denies fever, night sweats, weight loss, and fatigue.  Skin:   Patient denies skin rash/ lesion and itching.  Eyes:   Patient denies blurred vision and double  vision.  Ears/ Nose/ Throat:   Patient denies sore throat and sinus problems.  Hematologic/Lymphatic:   Patient denies swollen glands and easy bruising.  Cardiovascular:   Patient denies leg swelling and chest pains.  Respiratory:   Patient denies cough and shortness of breath.  Endocrine:   Patient denies excessive thirst.  Musculoskeletal:   Patient denies back pain and joint pain.  Neurological:   Patient denies headaches and dizziness.  Psychologic:   Patient denies depression and anxiety.   VITAL SIGNS:      04/20/2021 01:20 PM  Weight 128 lb / 58.06 kg  Height 58 in / 147.32 cm  BP 141/83 mmHg  Pulse 85 /min  Temperature 98.4 F / 36.8 C  BMI 26.7 kg/m   GU PHYSICAL EXAMINATION:    External Genitalia: No hirsutism, no rash, no scarring, no cyst, no erythematous lesion, no papular lesion, no blanched lesion, no warty lesion. No edema.  Urethral Meatus: Normal size. Normal position. No discharge. +SUI with cough.   Vagina: Mild vaginal atrophy. Moderate grade 2 cystocele. No stenosis. No rectocele. No enterocele.    MULTI-SYSTEM PHYSICAL EXAMINATION:    Constitutional: Well-nourished. No physical deformities. Normally developed. Good grooming.  Neck: Neck symmetrical, not swollen. Normal tracheal position.  Respiratory: No labored breathing, no use of accessory muscles.   Skin: No paleness, no jaundice, no cyanosis. No lesion, no ulcer, no rash.  Neurologic / Psychiatric: Oriented to time, oriented to place, oriented to person. No depression, no anxiety, no agitation.  Gastrointestinal: No rigidity  Eyes: Normal conjunctivae. Normal eyelids.  Ears, Nose,  Mouth, and Throat: Left ear no scars, no lesions, no masses. Right ear no scars, no lesions, no masses. Nose no scars, no lesions, no masses. Normal hearing. Normal lips.  Musculoskeletal: Normal gait and station of head and neck.     Complexity of Data:  Records Review:   Previous Patient Records  Urine Test Review:    Urinalysis   PROCEDURES:         Flexible Cystoscopy - 52000  Risks, benefits, and some of the potential complications of the procedure were discussed at length with the patient including infection, bleeding, voiding discomfort, urinary retention, fever, chills, sepsis, and others. All questions were answered. Informed consent was obtained. Sterile technique and intraurethral analgesia were used.  Meatus:  Normal size. Normal location. Normal condition.  Urethra:  No hypermobility. +SUI with cough  Ureteral Orifices:  Normal location. Normal size. Normal shape. Effluxed clear urine.  Bladder:  No trabeculation. Abnormal bladder mucosa noted posterior to LEFT UO. No stones.      The lower urinary tract was carefully examined. The procedure was well-tolerated and without complications. Instructions were given to call the office immediately for bloody urine, difficulty urinating, urinary retention, painful or frequent urination, fever, chills, nausea, vomiting or other illness. The patient stated that she understood these instructions and would comply with them.         Urinalysis Dipstick Dipstick Cont'd  Color: Yellow Bilirubin: Neg mg/dL  Appearance: Clear Ketones: Neg mg/dL  Specific Gravity: 1.010 Blood: Neg ery/uL  pH: 7.5 Protein: Neg mg/dL  Glucose: Neg mg/dL Urobilinogen: 0.2 mg/dL    Nitrites: Neg    Leukocyte Esterase: Neg leu/uL    ASSESSMENT:      ICD-10 Details  1 GU:   Stress Incontinence - N39.3 Chronic, Stable - I would like to proceed with cystoscopy and bladder biopsy prior to bulkamid.   2   Bladder tumor/neoplasm - D41.4 Undiagnosed New Problem - Cystoscopy showed abnormal bladder mucosa concerning for possible neoplasm. Discussed risks and benefits of cystoscopy with bladder biopsy and fulguration. These include but not limited to bleeding, infection, dysuria, pain, damage to surrounding structures, need for Foley catheter, need for future treatment. Patient understands  and wishes to proceed. She will be scheduled for surgery.

## 2021-05-12 NOTE — Anesthesia Procedure Notes (Signed)
Procedure Name: LMA Insertion Date/Time: 05/12/2021 7:38 AM Performed by: Rogers Blocker, CRNA Pre-anesthesia Checklist: Patient identified, Emergency Drugs available, Suction available and Patient being monitored Patient Re-evaluated:Patient Re-evaluated prior to induction Oxygen Delivery Method: Circle System Utilized Preoxygenation: Pre-oxygenation with 100% oxygen Induction Type: IV induction Ventilation: Mask ventilation without difficulty LMA: LMA inserted LMA Size: 3.0 Number of attempts: 1 Placement Confirmation: positive ETCO2 Tube secured with: Tape Dental Injury: Teeth and Oropharynx as per pre-operative assessment

## 2021-05-12 NOTE — Interval H&P Note (Signed)
History and Physical Interval Note:  05/12/2021 7:13 AM  Nichole Delgado  has presented today for surgery, with the diagnosis of ABNORMAL BLADDER MUCOSA / LESION.  The various methods of treatment have been discussed with the patient and family. After consideration of risks, benefits and other options for treatment, the patient has consented to  Procedure(s) with comments: [place] (N/A) - 30 MINS TRANSURETHRAL RESECTION OF BLADDER TUMOR (TURBT) (N/A) as a surgical intervention.  The patient's history has been reviewed, patient examined, no change in status, stable for surgery.  I have reviewed the patient's chart and labs.  Questions were answered to the patient's satisfaction.     Julee Stoll D Ashur Glatfelter

## 2021-05-13 ENCOUNTER — Encounter (HOSPITAL_BASED_OUTPATIENT_CLINIC_OR_DEPARTMENT_OTHER): Payer: Self-pay | Admitting: Urology

## 2021-05-13 LAB — SURGICAL PATHOLOGY

## 2021-05-19 DIAGNOSIS — D414 Neoplasm of uncertain behavior of bladder: Secondary | ICD-10-CM | POA: Diagnosis not present

## 2021-05-19 DIAGNOSIS — N393 Stress incontinence (female) (male): Secondary | ICD-10-CM | POA: Diagnosis not present

## 2021-05-25 DIAGNOSIS — Z9689 Presence of other specified functional implants: Secondary | ICD-10-CM | POA: Diagnosis not present

## 2021-05-25 DIAGNOSIS — G90523 Complex regional pain syndrome I of lower limb, bilateral: Secondary | ICD-10-CM | POA: Diagnosis not present

## 2021-05-25 DIAGNOSIS — G5790 Unspecified mononeuropathy of unspecified lower limb: Secondary | ICD-10-CM | POA: Diagnosis not present

## 2021-06-02 DIAGNOSIS — R197 Diarrhea, unspecified: Secondary | ICD-10-CM | POA: Diagnosis not present

## 2021-06-08 DIAGNOSIS — M170 Bilateral primary osteoarthritis of knee: Secondary | ICD-10-CM | POA: Diagnosis not present

## 2021-06-12 ENCOUNTER — Other Ambulatory Visit: Payer: Self-pay

## 2021-06-12 ENCOUNTER — Ambulatory Visit (INDEPENDENT_AMBULATORY_CARE_PROVIDER_SITE_OTHER): Payer: Medicare Other | Admitting: Internal Medicine

## 2021-06-12 ENCOUNTER — Encounter: Payer: Self-pay | Admitting: Internal Medicine

## 2021-06-12 VITALS — BP 148/90 | HR 83 | Ht <= 58 in | Wt 130.2 lb

## 2021-06-12 DIAGNOSIS — Z8341 Family history of multiple endocrine neoplasia [MEN] syndrome: Secondary | ICD-10-CM | POA: Diagnosis not present

## 2021-06-12 DIAGNOSIS — E042 Nontoxic multinodular goiter: Secondary | ICD-10-CM

## 2021-06-12 LAB — BASIC METABOLIC PANEL
BUN: 12 mg/dL (ref 6–23)
CO2: 27 mEq/L (ref 19–32)
Calcium: 10.2 mg/dL (ref 8.4–10.5)
Chloride: 103 mEq/L (ref 96–112)
Creatinine, Ser: 0.97 mg/dL (ref 0.40–1.20)
GFR: 58.62 mL/min — ABNORMAL LOW (ref >60.00)
Glucose, Bld: 95 mg/dL (ref 70–99)
Potassium: 3.9 mEq/L (ref 3.5–5.1)
Sodium: 138 mEq/L (ref 135–145)

## 2021-06-12 LAB — ALBUMIN: Albumin: 4.4 g/dL (ref 3.5–5.2)

## 2021-06-12 NOTE — Progress Notes (Signed)
Name: Nichole Delgado  MRN/ DOB: [id number], November 29, 1951    Age/ Sex: 70 y.o., female    PCP: Vivi Barrack, MD   Reason for Endocrinology Evaluation: MNG     Date of Initial Endocrinology Evaluation: 06/12/2021     HPI: Nichole Delgado is a 70 y.o. female with a past medical history of Urinary incontinence, has a spinal cord stimulator . The patient presented for initial endocrinology clinic visit on 06/12/2021 for consultative assistance with her MNG.   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 with MEN2  HISTORY:  Past Medical History:  Past Medical History:  Diagnosis Date   Cervical spondylosis    Complex regional pain syndrome I    followed by pain clinic, pcp,  has spinal cord stimulator   DDD (degenerative disc disease), lumbar    GAD (generalized anxiety disorder)    GERD (gastroesophageal reflux disease)    History of basal cell carcinoma (BCC) excision    per pt moh's sx on nose in 2020   History of chronic bronchitis    uses inhaler prn   Hypertension    followed by pcp and dr hilty   Irritable bowel syndrome with diarrhea    followed by dr Watt Climes (gi)   Lesion of bladder    Migraines    neurology-- [facility] in [place]   Mixed dyslipidemia    followed by lipid clinic--- dr Debara Pickett   Neuropathy    OA (osteoarthritis)    PONV (postoperative nausea and vomiting)    severe   Pre-diabetes    S/P insertion of spinal 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   SUI (stress urinary incontinence, female)    Thyroid cyst    04-24-2021  pt stated recently dx and has been referred to endocrinologist   Vertigo    Wears glasses    Past Surgical History:  Past Surgical History:  Procedure Laterality  Date   BACK SURGERY     CATARACT EXTRACTION W/ INTRAOCULAR LENS  IMPLANT, BILATERAL  2005   COLONOSCOPY W/ POLYPECTOMY     CYSTOSCOPY WITH BIOPSY N/A 05/12/2021   Procedure: CYSTOSCOPY WITH BIOPSY AND FULGERATION;  Surgeon: Robley Fries, MD;  Location: [place];  Service: Urology;  Laterality: N/A;  30 MINS   FOOT SURGERY Right 2011   hammer toe   ORIF ANKLE FRACTURE Left 03/27/2017   Procedure: OPEN REDUCTION INTERNAL FIXATION (ORIF) BIMALLEOLAR ANKLE FRACTURE;  Surgeon: Renette Butters, MD;  Location: [place];  Service: Orthopedics;  Laterality: Left;   ORIF CALCANEOUS FRACTURE Right 04/05/2017   Procedure: OPEN REDUCTION INTERNAL FIXATION (ORIF) CALCANEOUS AND FIBULA FRACTURE;  Surgeon: Renette Butters, MD;  Location: [place];  Service: Orthopedics;  Laterality: Right;   POSTERIOR LUMBAR FUSION  09-08-2020  @[facility]    L5--S1   SPINAL CORD STIMULATOR INSERTION  08/15/2018   SUPRACERVICAL ABDOMINAL HYSTERECTOMY  1988   TONSILLECTOMY AND ADENOIDECTOMY  child    Social History:  reports that she has been smoking cigarettes. She has a 45.00 pack-year smoking history. She has never used smokeless tobacco. She reports previous alcohol use. She reports that she does not use drugs. Family History: family history  includes Cancer in her brother and sister; Heart attack in her father; Migraines in her daughter and mother.   HOME MEDICATIONS: Allergies as of 06/12/2021       Reactions   Penicillins Hives   Has patient had a PCN reaction causing immediate rash, facial/tongue/throat swelling, SOB or lightheadedness with hypotension: No Has patient had a PCN reaction causing severe rash involving mucus membranes or skin necrosis: Yes Has patient had a PCN reaction that required hospitalization No Has patient had a PCN reaction occurring within the last 10 years: No If all of the above answers are "NO", then may proceed with Cephalosporin use.   Sulfa Antibiotics Hives, Rash    Other    States she has allergies to other seizure medications but doesn't know which ones   Codeine Nausea Only   Hydrocodone Rash   Keppra [levetiracetam] Rash   Topamax [topiramate] Nausea Only   Weight loss        Medication List        Accurate as of June 12, 2021  2:15 PM. If you have any questions, ask your nurse or doctor.          albuterol 108 (90 Base) MCG/ACT inhaler Commonly known as: Ventolin HFA Inhale 2 puffs into the lungs every 6 (six) hours as needed for wheezing or shortness of breath.   Azelaic Acid 15 % gel Apply topically daily. After skin is thoroughly washed and patted dry, gently but thoroughly massage a thin film of azelaic acid cream into the affected area twice daily, in the morning and evening.   Ca Phosphate-Cholecalciferol 250-500 MG-UNIT Chew Chew 1 each by mouth 2 (two) times daily.   CALCIUM 500 +D PO Take 1 tablet by mouth daily.   dicyclomine 10 MG capsule Commonly known as: BENTYL Take 10 mg by mouth 3 (three) times daily as needed.   estradiol 0.1 mg/24hr patch Commonly known as: CLIMARA - Dosed in mg/24 hr Place 0.1 mg onto the skin 2 (two) times a week.   fluticasone 50 MCG/ACT nasal spray Commonly known as: FLONASE Place 1 spray into both nostrils daily.   ibuprofen 200 MG tablet Commonly known as: ADVIL Take 400 mg by mouth every 6 (six) hours as needed for mild pain.   loperamide 2 MG capsule Commonly known as: IMODIUM Take 2 mg by mouth as needed.   losartan 25 MG tablet Commonly known as: COZAAR Take 1 tablet (25 mg total) by mouth daily. What changed: when to take this   meclizine 25 MG tablet Commonly known as: ANTIVERT Take 25 mg by mouth 3 (three) times daily as needed for dizziness.   nortriptyline 10 MG capsule Commonly known as: PAMELOR Take 10 mg by mouth at bedtime.   omeprazole 20 MG capsule Commonly known as: PRILOSEC Take 1 capsule (20 mg total) by mouth 2 (two) times daily before a  meal. What changed:  when to take this reasons to take this   ondansetron 8 MG tablet Commonly known as: ZOFRAN Take by mouth every 8 (eight) hours as needed for nausea or vomiting.   perphenazine 2 MG tablet Commonly known as: TRILAFON Take 2 mg by mouth at bedtime.   pravastatin 40 MG tablet Commonly known as: PRAVACHOL Take 1 tablet (40 mg total) by mouth at bedtime.   traMADol 50 MG tablet Commonly known as: ULTRAM Take by mouth every 6 (six) hours as needed.   Viberzi 75 MG Tabs Generic drug: Eluxadoline Take 1 tablet by  mouth 2 (two) times daily.   Vitamin D3 50 MCG (2000 UT) Tabs Take 1 tablet by mouth daily.   zolpidem 10 MG tablet Commonly known as: AMBIEN Take 10 mg by mouth at bedtime as needed for sleep.          REVIEW OF SYSTEMS: A comprehensive ROS was conducted with the patient and is negative except as per HPI     OBJECTIVE:  VS: BP (!) 148/90   Pulse 83   Ht 4\' 10"  (1.473 m)   Wt 130 lb 3.2 oz (59.1 kg)   SpO2 99%   BMI 27.21 kg/m    Wt Readings from Last 3 Encounters:  06/12/21 130 lb 3.2 oz (59.1 kg)  05/12/21 129 lb 6.4 oz (58.7 kg)  04/27/21 130 lb 9.6 oz (59.2 kg)     EXAM: General: Pt appears well and is in NAD  Neck: General: Supple without adenopathy. Thyroid: Thyroid size normal.  No goiter or nodules appreciated. No thyroid bruit.  Lungs: Clear with good BS bilat with no rales, rhonchi, or wheezes  Heart: Auscultation: RRR.  Abdomen: Normoactive bowel sounds, soft, nontender, without masses or organomegaly palpable  Extremities:  BL LE: No pretibial edema normal ROM and strength.  Skin: Hair: Texture and amount normal with gender appropriate distribution Skin Inspection: No rashes Skin Palpation: Skin temperature, texture, and thickness normal to palpation  Neuro: Cranial nerves: II - XII grossly intact Motor: Normal strength throughout DTRs: 2+ and symmetric in UE without delay in relaxation phase  Mental Status:  Judgment, insight: Intact Orientation: Oriented to time, place, and person Mood and affect: No depression, anxiety, or agitation     DATA REVIEWED: Results for Nichole Delgado, Nichole Delgado (MRN [id number]) as of 06/12/2021 12:31  Ref. Range 03/31/2021 15:16  TSH Latest Ref Range: 0.35 - 4.50 uIU/mL 1.98    Results for Nichole Delgado, Nichole Delgado (MRN [id number]) as of 06/14/2021 09:37  Ref. Range 06/12/2021 14:27  Sodium Latest Ref Range: 135 - 145 mEq/L 138  Potassium Latest Ref Range: 3.5 - 5.1 mEq/L 3.9  Chloride Latest Ref Range: 96 - 112 mEq/L 103  CO2 Latest Ref Range: 19 - 32 mEq/L 27  Glucose Latest Ref Range: 70 - 99 mg/dL 95  BUN Latest Ref Range: 6 - 23 mg/dL 12  Creatinine Latest Ref Range: 0.40 - 1.20 mg/dL 0.97  Calcium Latest Ref Range: 8.4 - 10.5 mg/dL 10.2  Albumin Latest Ref Range: 3.5 - 5.2 g/dL 4.4  GFR Latest Ref Range: >60.00 mL/min 58.62 (L)    ASSESSMENT/PLAN/RECOMMENDATIONS:   MNG:  - She is clinically and biochemically euthyroid  - NO local neck symptoms  - MNG was an incidental finding on carotid doppler , will proceed with dedicated thyroid ultrasound  - She has a cousin with MEN 2 , I am going to check calcitonin   F/U in 1 yr  Signed electronically by: Mack Guise, MD  [facility]  [address] [place],  [address] Phone: [phone] FAX: [phone]   CC: Vivi Barrack, [address] [place] [address] Phone: [phone] Fax: [phone]   Return to Endocrinology clinic as below: Future Appointments  Date Time Provider [place]  07/06/2021  4:15 PM Trula Slade, DPM [facility]

## 2021-06-14 ENCOUNTER — Encounter: Payer: Self-pay | Admitting: Internal Medicine

## 2021-06-16 LAB — CALCITONIN: Calcitonin: <2 pg/mL (ref <=5)

## 2021-06-16 LAB — PARATHYROID HORMONE, INTACT (NO CA): PTH: 51 pg/mL (ref 16–77)

## 2021-06-17 DIAGNOSIS — M170 Bilateral primary osteoarthritis of knee: Secondary | ICD-10-CM | POA: Diagnosis not present

## 2021-06-18 ENCOUNTER — Ambulatory Visit
Admission: RE | Admit: 2021-06-18 | Discharge: 2021-06-18 | Disposition: A | Discharge status: Home / Self Care | Payer: Medicare Other | Source: Ambulatory Visit | Attending: Internal Medicine | Admitting: Internal Medicine

## 2021-06-18 ENCOUNTER — Other Ambulatory Visit: Payer: Self-pay

## 2021-06-18 DIAGNOSIS — E041 Nontoxic single thyroid nodule: Secondary | ICD-10-CM | POA: Diagnosis not present

## 2021-06-18 DIAGNOSIS — E042 Nontoxic multinodular goiter: Secondary | ICD-10-CM

## 2021-06-24 DIAGNOSIS — M170 Bilateral primary osteoarthritis of knee: Secondary | ICD-10-CM | POA: Diagnosis not present

## 2021-06-26 ENCOUNTER — Other Ambulatory Visit: Payer: Self-pay

## 2021-06-26 ENCOUNTER — Encounter: Payer: Self-pay | Admitting: Podiatry

## 2021-06-26 ENCOUNTER — Ambulatory Visit (INDEPENDENT_AMBULATORY_CARE_PROVIDER_SITE_OTHER): Payer: Medicare Other | Admitting: Podiatry

## 2021-06-26 ENCOUNTER — Ambulatory Visit (INDEPENDENT_AMBULATORY_CARE_PROVIDER_SITE_OTHER): Payer: Medicare Other

## 2021-06-26 DIAGNOSIS — M7751 Other enthesopathy of right foot: Secondary | ICD-10-CM | POA: Diagnosis not present

## 2021-06-26 DIAGNOSIS — M19071 Primary osteoarthritis, right ankle and foot: Secondary | ICD-10-CM | POA: Diagnosis not present

## 2021-07-01 NOTE — Progress Notes (Signed)
Subjective: 70 year old female presents the office today for concerns of pain to the also aspect the right foot.  She points along the lateral aspect where she gets discomfort.  Denies any recent injury or trauma since I last saw her.  She has a history of bilateral ankle, calcaneal fractures.  She follows up with Dr. Percell Miller for this.  She does have a spinal cord stimulator now.  Denies any systemic complaints such as fevers, chills, nausea, vomiting. No acute changes since last appointment, and no other complaints at this time.   Objective: AAO x3, NAD DP/PT pulses palpable bilaterally, CRT less than 3 seconds Today the majority tenderness is localized to the lateral aspect of the right foot and sinus tarsi there is localized edema.  There is mild discomfort on the course of the peroneal tendon.  No pain with ankle range of motion.  No other areas of pinpoint tenderness. No pain with calf compression, swelling, warmth, erythema  Assessment: Capsulitis right foot  Plan: -All treatment options discussed with the patient including all alternatives, risks, complications.  -Steroid injection performed today to the sinus tarsi.  The skin was prepped with Betadine, alcohol and mixture 1 cc Kenalog 10, 0.5 cc of Marcaine plain, 0.5 cc of lidocaine plain was infiltrated into the sinus tarsi without complications.  Postinjection care was discussed.  She tolerated well. -X-rays ordered -Patient encouraged to call the office with any questions, concerns, change in symptoms.   Trula Slade DPM

## 2021-07-02 DIAGNOSIS — D225 Melanocytic nevi of trunk: Secondary | ICD-10-CM | POA: Diagnosis not present

## 2021-07-02 DIAGNOSIS — D485 Neoplasm of uncertain behavior of skin: Secondary | ICD-10-CM | POA: Diagnosis not present

## 2021-07-02 DIAGNOSIS — Z85828 Personal history of other malignant neoplasm of skin: Secondary | ICD-10-CM | POA: Diagnosis not present

## 2021-07-02 DIAGNOSIS — D234 Other benign neoplasm of skin of scalp and neck: Secondary | ICD-10-CM | POA: Diagnosis not present

## 2021-07-02 DIAGNOSIS — L820 Inflamed seborrheic keratosis: Secondary | ICD-10-CM | POA: Diagnosis not present

## 2021-07-02 DIAGNOSIS — L718 Other rosacea: Secondary | ICD-10-CM | POA: Diagnosis not present

## 2021-07-02 DIAGNOSIS — L905 Scar conditions and fibrosis of skin: Secondary | ICD-10-CM | POA: Diagnosis not present

## 2021-07-06 ENCOUNTER — Ambulatory Visit: Payer: Medicare Other | Admitting: Podiatry

## 2021-07-06 DIAGNOSIS — N3642 Intrinsic sphincter deficiency (ISD): Secondary | ICD-10-CM | POA: Diagnosis not present

## 2021-07-09 ENCOUNTER — Ambulatory Visit (INDEPENDENT_AMBULATORY_CARE_PROVIDER_SITE_OTHER): Payer: Medicare Other | Admitting: Family Medicine

## 2021-07-09 ENCOUNTER — Other Ambulatory Visit: Payer: Self-pay

## 2021-07-09 ENCOUNTER — Encounter: Payer: Self-pay | Admitting: Family Medicine

## 2021-07-09 VITALS — BP 116/73 | HR 78 | Temp 98.1°F | Ht <= 58 in | Wt 130.4 lb

## 2021-07-09 DIAGNOSIS — H6983 Other specified disorders of Eustachian tube, bilateral: Secondary | ICD-10-CM | POA: Diagnosis not present

## 2021-07-09 DIAGNOSIS — K580 Irritable bowel syndrome with diarrhea: Secondary | ICD-10-CM

## 2021-07-09 NOTE — Progress Notes (Signed)
Subjective  CC:  Chief Complaint  Patient presents with   Ear Pain   Fever    Elevated temperature last week  Done home covid test with negative results     HPI: Nichole Delgado is a 70 y.o. female who presents to the office today to address the problems listed above in the chief complaint. 70 yo with IBS had IBS flare last week with nonbloody diarrhea and abdominal cramping. Then noted low grade temps x 3 days: max 100. Resolved spontaneously but remains with ear fullness and discomfort that is intermittent. Has mild nasal congestion from allergies. No st or cough. No sob. Home covid tests neg x 2. No further GI sxs. Ears full "full". No hearing loss.   Assessment  1. ETD (Eustachian tube dysfunction), bilateral   2. Irritable bowel syndrome with diarrhea      Plan  ETD:  due to SAR: add allegra to flonase. Reassured. No infection.  IBS vs mild diverticulitis. Resolved monitor temp curve F/u if recurs  Follow up: as needed  Visit date not found  No orders of the defined types were placed in this encounter.  No orders of the defined types were placed in this encounter.     I reviewed the patients updated PMH, FH, and SocHx.    Patient Active Problem List   Diagnosis Date Noted   Female stress incontinence 11/26/2020   CRPS (complex regional pain syndrome), lower limb with spinal cord simulator 11/26/2020   Essential hypertension 11/26/2020   Chronic bronchitis ([place]) 11/26/2020   Emotional disorder 11/26/2020   Menopausal symptoms 11/26/2020   Migraine 11/26/2020   Basal cell carcinoma (BCC) 11/26/2020   Other spondylosis with myelopathy, lumbar region 09/09/2020   Body mass index (BMI) 26.0-26.9, adult 08/05/2020   Elevated blood-pressure reading, without diagnosis of hypertension 08/05/2020   Spinal cord stimulator status 06/18/2020   Pain in left foot 02/13/2020   Chronic pain 11/06/2019   Hyperlipidemia, unspecified 11/06/2019   Peripheral neuropathic pain  09/27/2017   DDD (degenerative disc disease), lumbosacral    Left foot drop    GERD (gastroesophageal reflux disease) 03/26/2017   Dyslipidemia    Tobacco abuse    IBS (irritable bowel syndrome)    Osteoporosis 02/24/2016   Osteoarthritis 02/24/2016   Insomnia 02/24/2016   Current Meds  Medication Sig   albuterol (VENTOLIN HFA) 108 (90 Base) MCG/ACT inhaler Inhale 2 puffs into the lungs every 6 (six) hours as needed for wheezing or shortness of breath.   Azelaic Acid 15 % gel Apply topically daily. After skin is thoroughly washed and patted dry, gently but thoroughly massage a thin film of azelaic acid cream into the affected area twice daily, in the morning and evening.   Ca Phosphate-Cholecalciferol 250-500 MG-UNIT CHEW Chew 1 each by mouth 2 (two) times daily.   Calcium Carb-Cholecalciferol (CALCIUM 500 +D PO) Take 1 tablet by mouth daily.   Cholecalciferol (VITAMIN D3) 50 MCG (2000 UT) TABS Take 1 tablet by mouth daily.   diazepam (VALIUM) 10 MG tablet Take by mouth.   dicyclomine (BENTYL) 10 MG capsule Take 10 mg by mouth 3 (three) times daily as needed.   Eluxadoline (VIBERZI) 75 MG TABS Take 1 tablet by mouth 2 (two) times daily.   estradiol (CLIMARA - DOSED IN MG/24 HR) 0.1 mg/24hr patch Place 0.1 mg onto the skin 2 (two) times a week.   fluticasone (FLONASE) 50 MCG/ACT nasal spray Place 1 spray into both nostrils daily.  ibuprofen (ADVIL,MOTRIN) 200 MG tablet Take 400 mg by mouth every 6 (six) hours as needed for mild pain.   loperamide (IMODIUM) 2 MG capsule Take 2 mg by mouth as needed.   meclizine (ANTIVERT) 25 MG tablet Take 25 mg by mouth 3 (three) times daily as needed for dizziness.   nortriptyline (PAMELOR) 10 MG capsule Take 10 mg by mouth at bedtime.   omeprazole (PRILOSEC) 20 MG capsule Take 1 capsule (20 mg total) by mouth 2 (two) times daily before a meal. (Patient taking differently: Take 20 mg by mouth 2 (two) times daily as needed.)   ondansetron (ZOFRAN) 8 MG  tablet Take by mouth every 8 (eight) hours as needed for nausea or vomiting.   perphenazine (TRILAFON) 2 MG tablet Take 2 mg by mouth at bedtime.   pravastatin (PRAVACHOL) 40 MG tablet Take 1 tablet (40 mg total) by mouth at bedtime. (Patient taking differently: Take 40 mg by mouth at bedtime.)   traMADol (ULTRAM) 50 MG tablet Take by mouth every 6 (six) hours as needed.   zolpidem (AMBIEN) 10 MG tablet Take 10 mg by mouth at bedtime as needed for sleep.    Allergies: Patient is allergic to penicillins, sulfa antibiotics, other, codeine, hydrocodone, keppra [levetiracetam], and topamax [topiramate]. Family History: Patient family history includes Cancer in her brother and sister; Heart attack in her father; Migraines in her daughter and mother. Social History:  Patient  reports that she has been smoking cigarettes. She has a 45.00 pack-year smoking history. She has never used smokeless tobacco. She reports previous alcohol use. She reports that she does not use drugs.  Review of Systems: Constitutional: Negative for fever malaise or anorexia Cardiovascular: negative for chest pain Respiratory: negative for SOB or persistent cough Gastrointestinal: negative for abdominal pain  Objective  Vitals: BP 116/73   Pulse 78   Temp 98.1 F (36.7 C) (Temporal)   Ht 4\' 10"  (1.473 m)   Wt 130 lb 6.4 oz (59.1 kg)   SpO2 96%   BMI 27.25 kg/m  General: no acute distress , A&Ox3 HEENT: PEERL, conjunctiva normal, neck is supple, TMS normal  bilateral with mild fluid, no LAD. OP clear Cardiovascular:  RRR without murmur or gallop.  Respiratory:  Good breath sounds bilaterally, CTAB with normal respiratory effort Gastrointestinal: soft, flat abdomen, normal active bowel sounds, no palpable masses, no hepatosplenomegaly, no appreciated hernias Skin:  Warm, no rashes    Commons side effects, risks, benefits, and alternatives for medications and treatment plan prescribed today were discussed, and the  patient expressed understanding of the given instructions. Patient is instructed to call or message via MyChart if he/she has any questions or concerns regarding our treatment plan. No barriers to understanding were identified. We discussed Red Flag symptoms and signs in detail. Patient expressed understanding regarding what to do in case of urgent or emergency type symptoms.  Medication list was reconciled, printed and provided to the patient in AVS. Patient instructions and summary information was reviewed with the patient as documented in the AVS. This note was prepared with assistance of Dragon voice recognition software. Occasional wrong-word or sound-a-like substitutions may have occurred due to the inherent limitations of voice recognition software  This visit occurred during the SARS-CoV-2 public health emergency.  Safety protocols were in place, including screening questions prior to the visit, additional usage of staff PPE, and extensive cleaning of exam room while observing appropriate contact time as indicated for disinfecting solutions.

## 2021-07-09 NOTE — Patient Instructions (Addendum)
Please follow up if symptoms do not improve or as needed.    Start zyrtec or allegra daily and increase flonase to twice a day for a week.   Eustachian Tube Dysfunction  Eustachian tube dysfunction refers to a condition in which a blockage develops in the narrow passage that connects the middle ear to the back of the nose (eustachian tube). The eustachian tube regulates air pressure in the middle ear by letting air move between the ear and nose. It also helps to drain fluid from the middle earspace. Eustachian tube dysfunction can affect one or both ears. When the eustachian tube does not function properly, air pressure, fluid, or both can build up inthe middle ear. What are the causes? This condition occurs when the eustachian tube becomes blocked or cannot open normally. Common causes of this condition include: Ear infections. Colds and other infections that affect the nose, mouth, and throat (upper respiratory tract). Allergies. Irritation from cigarette smoke. Irritation from stomach acid coming up into the esophagus (gastroesophageal reflux). The esophagus is the tube that carries food from the mouth to the stomach. Sudden changes in air pressure, such as from descending in an airplane or scuba diving. Abnormal growths in the nose or throat, such as: Growths that line the nose (nasal polyps). Abnormal growth of cells (tumors). Enlarged tissue at the back of the throat (adenoids). What increases the risk? You are more likely to develop this condition if: You smoke. You are overweight. You are a child who has: Certain birth defects of the mouth, such as cleft palate. Large tonsils or adenoids. What are the signs or symptoms? Common symptoms of this condition include: A feeling of fullness in the ear. Ear pain. Clicking or popping noises in the ear. Ringing in the ear. Hearing loss. Loss of balance. Dizziness. Symptoms may get worse when the air pressure around you changes, such  as whenyou travel to an area of high elevation, fly on an airplane, or go scuba diving. How is this diagnosed? This condition may be diagnosed based on: Your symptoms. A physical exam of your ears, nose, and throat. Tests, such as those that measure: The movement of your eardrum (tympanogram). Your hearing (audiometry). How is this treated? Treatment depends on the cause and severity of your condition. In mild cases, you may relieve your symptoms by moving air into your ears. This is called "popping the ears." In more severe cases, or if you have symptoms of fluid in your ears, treatment may include: Medicines to relieve congestion (decongestants). Medicines that treat allergies (antihistamines). Nasal sprays or ear drops that contain medicines that reduce swelling (steroids). A procedure to drain the fluid in your eardrum (myringotomy). In this procedure, a small tube is placed in the eardrum to: Drain the fluid. Restore the air in the middle ear space. A procedure to insert a balloon device through the nose to inflate the opening of the eustachian tube (balloon dilation). Follow these instructions at home: Lifestyle Do not do any of the following until your health care provider approves: Travel to high altitudes. Fly in airplanes. Work in a Pension scheme manager or room. Scuba dive. Do not use any products that contain nicotine or tobacco, such as cigarettes and e-cigarettes. If you need help quitting, ask your health care provider. Keep your ears dry. Wear fitted earplugs during showering and bathing. Dry your ears completely after. General instructions Take over-the-counter and prescription medicines only as told by your health care provider. Use techniques to help pop  your ears as recommended by your health care provider. These may include: Chewing gum. Yawning. Frequent, forceful swallowing. Closing your mouth, holding your nose closed, and gently blowing as if you are trying to  blow air out of your nose. Keep all follow-up visits as told by your health care provider. This is important. Contact a health care provider if: Your symptoms do not go away after treatment. Your symptoms come back after treatment. You are unable to pop your ears. You have: A fever. Pain in your ear. Pain in your head or neck. Fluid draining from your ear. Your hearing suddenly changes. You become very dizzy. You lose your balance. Summary Eustachian tube dysfunction refers to a condition in which a blockage develops in the eustachian tube. It can be caused by ear infections, allergies, inhaled irritants, or abnormal growths in the nose or throat. Symptoms include ear pain, hearing loss, or ringing in the ears. Mild cases are treated with maneuvers to unblock the ears, such as yawning or ear popping. Severe cases are treated with medicines. Surgery may also be done (rare). This information is not intended to replace advice given to you by your health care provider. Make sure you discuss any questions you have with your healthcare provider. Document Revised: 03/28/2018 Document Reviewed: 03/28/2018 Elsevier Patient Education  [employer].

## 2021-07-16 DIAGNOSIS — F250 Schizoaffective disorder, bipolar type: Secondary | ICD-10-CM | POA: Diagnosis not present

## 2021-07-20 ENCOUNTER — Telehealth: Payer: Self-pay

## 2021-07-20 NOTE — Telephone Encounter (Signed)
Please advise 

## 2021-07-20 NOTE — Telephone Encounter (Signed)
Patient states she is still experiencing a low grade fever and left ear ache and was seen by dr.andy on 7/21 and having same symptoms and would like to have something called in for the ear ache and fever to [place], [place]

## 2021-07-21 ENCOUNTER — Other Ambulatory Visit: Payer: Self-pay

## 2021-07-21 MED ORDER — AZITHROMYCIN 250 MG PO TABS: ORAL_TABLET | ORAL | 0 refills | Status: AC

## 2021-07-21 NOTE — Telephone Encounter (Signed)
Zpak ordered and patient notified

## 2021-07-27 ENCOUNTER — Ambulatory Visit (INDEPENDENT_AMBULATORY_CARE_PROVIDER_SITE_OTHER): Payer: Medicare Other

## 2021-07-27 ENCOUNTER — Telehealth: Payer: Self-pay

## 2021-07-27 DIAGNOSIS — Z0000 Encounter for general adult medical examination without abnormal findings: Secondary | ICD-10-CM

## 2021-07-27 NOTE — Patient Instructions (Signed)
Nichole Delgado , Thank you for taking time to come for your Medicare Wellness Visit. I appreciate your ongoing commitment to your health goals. Please review the following plan we discussed and let me know if I can assist you in the future.   Screening recommendations/referrals: Colonoscopy: Pt will schedule with gastrologist during appt  Mammogram: Done 01/14/21 repeat every year Bone Density: Done 10/03/14 repeat every 2 years  Recommended yearly ophthalmology/optometry visit for glaucoma screening and checkup Recommended yearly dental visit for hygiene and checkup  Vaccinations: Influenza vaccine: Due after 07/20/21 Pneumococcal vaccine: Due and discussed  Tdap vaccine: Due and discussed Shingles vaccine: Shingrix discussed. Please contact your pharmacy for coverage information.     Covid-19:Completed 2/8, 3/5 & 10/20/20  Advanced directives: Please bring a copy of your health care power of attorney and living will to the office at your convenience.  Conditions/risks identified: Completed   Next appointment: Follow up in one year for your annual wellness visit    Preventive Care 72 Years and Older, Female Preventive care refers to lifestyle choices and visits with your health care provider that can promote health and wellness. What does preventive care include? A yearly physical exam. This is also called an annual well check. Dental exams once or twice a year. Routine eye exams. Ask your health care provider how often you should have your eyes checked. Personal lifestyle choices, including: Daily care of your teeth and gums. Regular physical activity. Eating a healthy diet. Avoiding tobacco and drug use. Limiting alcohol use. Practicing safe sex. Taking low-dose aspirin every day. Taking vitamin and mineral supplements as recommended by your health care provider. What happens during an annual well check? The services and screenings done by your health care provider during your  annual well check will depend on your age, overall health, lifestyle risk factors, and family history of disease. Counseling  Your health care provider may ask you questions about your: Alcohol use. Tobacco use. Drug use. Emotional well-being. Home and relationship well-being. Sexual activity. Eating habits. History of falls. Memory and ability to understand (cognition). Work and work Statistician. Reproductive health. Screening  You may have the following tests or measurements: Height, weight, and BMI. Blood pressure. Lipid and cholesterol levels. These may be checked every 5 years, or more frequently if you are over 18 years old. Skin check. Lung cancer screening. You may have this screening every year starting at age 72 if you have a 30-pack-year history of smoking and currently smoke or have quit within the past 15 years. Fecal occult blood test (FOBT) of the stool. You may have this test every year starting at age 72. Flexible sigmoidoscopy or colonoscopy. You may have a sigmoidoscopy every 5 years or a colonoscopy every 10 years starting at age 72. Hepatitis C blood test. Hepatitis B blood test. Sexually transmitted disease (STD) testing. Diabetes screening. This is done by checking your blood sugar (glucose) after you have not eaten for a while (fasting). You may have this done every 1-3 years. Bone density scan. This is done to screen for osteoporosis. You may have this done starting at age 72. Mammogram. This may be done every 1-2 years. Talk to your health care provider about how often you should have regular mammograms. Talk with your health care provider about your test results, treatment options, and if necessary, the need for more tests. Vaccines  Your health care provider may recommend certain vaccines, such as: Influenza vaccine. This is recommended every year. Tetanus, diphtheria, and  acellular pertussis (Tdap, Td) vaccine. You may need a Td booster every 10  years. Zoster vaccine. You may need this after age 30. Pneumococcal 13-valent conjugate (PCV13) vaccine. One dose is recommended after age 72. Pneumococcal polysaccharide (PPSV23) vaccine. One dose is recommended after age 72. Talk to your health care provider about which screenings and vaccines you need and how often you need them. This information is not intended to replace advice given to you by your health care provider. Make sure you discuss any questions you have with your health care provider. Document Released: 01/02/2016 Document Revised: 08/25/2016 Document Reviewed: 10/07/2015 Elsevier Interactive Patient Education  2017 [employer] Prevention in the Home Falls can cause injuries. They can happen to people of all ages. There are many things you can do to make your home safe and to help prevent falls. What can I do on the outside of my home? Regularly fix the edges of walkways and driveways and fix any cracks. Remove anything that might make you trip as you walk through a door, such as a raised step or threshold. Trim any bushes or trees on the path to your home. Use bright outdoor lighting. Clear any walking paths of anything that might make someone trip, such as rocks or tools. Regularly check to see if handrails are loose or broken. Make sure that both sides of any steps have handrails. Any raised decks and porches should have guardrails on the edges. Have any leaves, snow, or ice cleared regularly. Use sand or salt on walking paths during winter. Clean up any spills in your garage right away. This includes oil or grease spills. What can I do in the bathroom? Use night lights. Install grab bars by the toilet and in the tub and shower. Do not use towel bars as grab bars. Use non-skid mats or decals in the tub or shower. If you need to sit down in the shower, use a plastic, non-slip stool. Keep the floor dry. Clean up any water that spills on the floor as soon as it  happens. Remove soap buildup in the tub or shower regularly. Attach bath mats securely with double-sided non-slip rug tape. Do not have throw rugs and other things on the floor that can make you trip. What can I do in the bedroom? Use night lights. Make sure that you have a light by your bed that is easy to reach. Do not use any sheets or blankets that are too big for your bed. They should not hang down onto the floor. Have a firm chair that has side arms. You can use this for support while you get dressed. Do not have throw rugs and other things on the floor that can make you trip. What can I do in the kitchen? Clean up any spills right away. Avoid walking on wet floors. Keep items that you use a lot in easy-to-reach places. If you need to reach something above you, use a strong step stool that has a grab bar. Keep electrical cords out of the way. Do not use floor polish or wax that makes floors slippery. If you must use wax, use non-skid floor wax. Do not have throw rugs and other things on the floor that can make you trip. What can I do with my stairs? Do not leave any items on the stairs. Make sure that there are handrails on both sides of the stairs and use them. Fix handrails that are broken or loose. Make sure that  handrails are as long as the stairways. Check any carpeting to make sure that it is firmly attached to the stairs. Fix any carpet that is loose or worn. Avoid having throw rugs at the top or bottom of the stairs. If you do have throw rugs, attach them to the floor with carpet tape. Make sure that you have a light switch at the top of the stairs and the bottom of the stairs. If you do not have them, ask someone to add them for you. What else can I do to help prevent falls? Wear shoes that: Do not have high heels. Have rubber bottoms. Are comfortable and fit you well. Are closed at the toe. Do not wear sandals. If you use a stepladder: Make sure that it is fully opened.  Do not climb a closed stepladder. Make sure that both sides of the stepladder are locked into place. Ask someone to hold it for you, if possible. Clearly mark and make sure that you can see: Any grab bars or handrails. First and last steps. Where the edge of each step is. Use tools that help you move around (mobility aids) if they are needed. These include: Canes. Walkers. Scooters. Crutches. Turn on the lights when you go into a dark area. Replace any light bulbs as soon as they burn out. Set up your furniture so you have a clear path. Avoid moving your furniture around. If any of your floors are uneven, fix them. If there are any pets around you, be aware of where they are. Review your medicines with your doctor. Some medicines can make you feel dizzy. This can increase your chance of falling. Ask your doctor what other things that you can do to help prevent falls. This information is not intended to replace advice given to you by your health care provider. Make sure you discuss any questions you have with your health care provider. Document Released: 10/02/2009 Document Revised: 05/13/2016 Document Reviewed: 01/10/2015 Elsevier Interactive Patient Education  2017 [employer].

## 2021-07-27 NOTE — Progress Notes (Addendum)
Virtual Visit via Telephone Note  I connected with  [name] on 07/27/21 at 11:45 AM EDT by telephone and verified that I am speaking with the correct person using two identifiers.  Medicare Annual Wellness visit completed telephonically due to Covid-19 pandemic.   Persons participating in this call: This Health Coach and this patient.   Location: Patient: Home Provider: Office    I discussed the limitations, risks, security and privacy concerns of performing an evaluation and management service by telephone and the availability of in person appointments. The patient expressed understanding and agreed to proceed.  Unable to perform video visit due to video visit attempted and failed and/or patient does not have video capability.   Some vital signs may be absent or patient reported.   Nichole Brace, LPN   Subjective:   LAIYA Delgado is a 70 y.o. female who presents for Medicare Annual (Subsequent) preventive examination.  Review of Systems     Cardiac Risk Factors include: advanced age (>7mn, >>20women);hypertension;dyslipidemia     Objective:    Today's Vitals   07/27/21 1138  PainSc: 7    There is no height or weight on file to calculate BMI.  Advanced Directives 07/27/2021 05/12/2021 07/14/2020 07/24/2019 03/10/2018 03/29/2017  Does Patient Have a Medical Advance Directive? Yes Yes Yes No No No  Type of AParamedicof AFort DuchesneLiving will - - - -  Does patient want to make changes to medical advance directive? - No - Patient declined No - Patient declined - - -  Copy of HRussellin Chart? No - copy requested No - copy requested - - - -  Would patient like information on creating a medical advance directive? - - - Yes (ED - Information included in AVS) - No - Patient declined    Current Medications (verified) Outpatient Encounter Medications as of 07/27/2021  Medication Sig   albuterol  (VENTOLIN HFA) 108 (90 Base) MCG/ACT inhaler Inhale 2 puffs into the lungs every 6 (six) hours as needed for wheezing or shortness of breath.   Azelaic Acid 15 % gel Apply topically daily. After skin is thoroughly washed and patted dry, gently but thoroughly massage a thin film of azelaic acid cream into the affected area twice daily, in the morning and evening.   Ca Phosphate-Cholecalciferol 250-500 MG-UNIT CHEW Chew 1 each by mouth 2 (two) times daily.   Calcium Carb-Cholecalciferol (CALCIUM 500 +D PO) Take 1 tablet by mouth daily.   Cholecalciferol (VITAMIN D3) 50 MCG (2000 UT) TABS Take 1 tablet by mouth daily.   dicyclomine (BENTYL) 10 MG capsule Take 10 mg by mouth 3 (three) times daily as needed.   estradiol (CLIMARA - DOSED IN MG/24 HR) 0.1 mg/24hr patch Place 0.1 mg onto the skin 2 (two) times a week.   fluticasone (FLONASE) 50 MCG/ACT nasal spray Place 1 spray into both nostrils daily.   ibuprofen (ADVIL,MOTRIN) 200 MG tablet Take 400 mg by mouth every 6 (six) hours as needed for mild pain.   loperamide (IMODIUM) 2 MG capsule Take 2 mg by mouth as needed.   meclizine (ANTIVERT) 25 MG tablet Take 25 mg by mouth 3 (three) times daily as needed for dizziness.   omeprazole (PRILOSEC) 20 MG capsule Take 1 capsule (20 mg total) by mouth 2 (two) times daily before a meal. (Patient taking differently: Take 20 mg by mouth 2 (two) times daily as needed.)   ondansetron (ZOFRAN) 8  MG tablet Take by mouth every 8 (eight) hours as needed for nausea or vomiting.   perphenazine (TRILAFON) 2 MG tablet Take 2 mg by mouth at bedtime.   pravastatin (PRAVACHOL) 40 MG tablet Take 1 tablet (40 mg total) by mouth at bedtime. (Patient taking differently: Take 40 mg by mouth at bedtime.)   traMADol (ULTRAM) 50 MG tablet Take by mouth every 6 (six) hours as needed.   zolpidem (AMBIEN) 10 MG tablet Take 10 mg by mouth at bedtime as needed for sleep.   Eluxadoline (VIBERZI) 75 MG TABS Take 1 tablet by mouth 2 (two)  times daily. (Patient not taking: Reported on 07/27/2021)   losartan (COZAAR) 25 MG tablet Take 1 tablet (25 mg total) by mouth daily. (Patient taking differently: Take 25 mg by mouth at bedtime.)   [DISCONTINUED] diazepam (VALIUM) 10 MG tablet Take by mouth. (Patient not taking: Reported on 07/27/2021)   [DISCONTINUED] nortriptyline (PAMELOR) 10 MG capsule Take 10 mg by mouth at bedtime. (Patient not taking: Reported on 07/27/2021)   No facility-administered encounter medications on file as of 07/27/2021.    Allergies (verified) Penicillins, Sulfa antibiotics, Other, Codeine, Hydrocodone, Keppra [levetiracetam], and Topamax [topiramate]   History: Past Medical History:  Diagnosis Date   Cervical spondylosis    Complex regional pain syndrome I    followed by pain clinic, pcp,  has spinal cord stimulator   DDD (degenerative disc disease), lumbar    GAD (generalized anxiety disorder)    GERD (gastroesophageal reflux disease)    History of basal cell carcinoma (BCC) excision    per pt moh's sx on nose in 2020   History of chronic bronchitis    uses inhaler prn   Hypertension    followed by pcp and dr hilty   Irritable bowel syndrome with diarrhea    followed by dr Watt Climes (gi)   Lesion of bladder    Migraines    neurology-- [facility] in [place]   Mixed dyslipidemia    followed by lipid clinic--- dr Debara Pickett   Neuropathy    OA (osteoarthritis)    PONV (postoperative nausea and vomiting)    severe   Pre-diabetes    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;  Service: Urology;  Laterality: N/A;  30 MINS   FOOT SURGERY Right 2011   hammer toe   ORIF ANKLE FRACTURE Left 03/27/2017   Procedure: OPEN REDUCTION INTERNAL FIXATION (ORIF) BIMALLEOLAR ANKLE FRACTURE;  Surgeon: Renette Butters, MD;  Location: [facility];  Service: Orthopedics;  Laterality: Left;   ORIF CALCANEOUS FRACTURE Right 04/05/2017   Procedure: OPEN REDUCTION INTERNAL FIXATION (ORIF) CALCANEOUS AND FIBULA FRACTURE;  Surgeon: Renette Butters, MD;  Location: [facility];  Service: Orthopedics;  Laterality: Right;   POSTERIOR LUMBAR FUSION  09-08-2020  '@[facility]'$    L5--S1  SPINAL CORD STIMULATOR INSERTION  08/15/2018   SUPRACERVICAL ABDOMINAL HYSTERECTOMY  1988   TONSILLECTOMY AND ADENOIDECTOMY  child   Family History  Problem Relation Age of Onset   Heart attack Father    Migraines Mother    Cancer Sister        Pancreatic   Cancer Brother        Liver   Migraines Daughter    Breast cancer Neg Hx    Social History   Socioeconomic History   Marital status: Married    Spouse name: Not on file   Number of children: 1   Years of education: 12   Highest education level: Not on file  Occupational History   Occupation: N/A  Tobacco Use   Smoking status: Every Day    Packs/day: 1.00    Years: 45.00    Pack years: 45.00    Types: Cigarettes   Smokeless tobacco: Never  Vaping Use   Vaping Use: Never used  Substance and Sexual Activity   Alcohol use: Not Currently    Alcohol/week: 0.0 standard drinks   Drug use: Never   Sexual activity: Not on file  Other Topics Concern   Not on file  Social History Narrative   Lives at home with her husband   Right-handed   Drinks about 5 cups of coffee per day   Social Determinants of Health   Financial Resource Strain: Low Risk    Difficulty of Paying Living  Expenses: Not hard at all  Food Insecurity: No Food Insecurity   Worried About Charity fundraiser in the Last Year: Never true   Arboriculturist in the Last Year: Never true  Transportation Needs: No Transportation Needs   Lack of Transportation (Medical): No   Lack of Transportation (Non-Medical): No  Physical Activity: Inactive   Days of Exercise per Week: 0 days   Minutes of Exercise per Session: 0 min  Stress: No Stress Concern Present   Feeling of Stress : Not at all  Social Connections: Moderately Isolated   Frequency of Communication with Friends and Family: More than three times a week   Frequency of Social Gatherings with Friends and Family: More than three times a week   Attends Religious Services: Never   Marine scientist or Organizations: No   Attends Music therapist: Never   Marital Status: Married    Tobacco Counseling Ready to quit: Not Answered Counseling given: Not Answered   Clinical Intake:  Pre-visit preparation completed: Yes  Pain : 0-10 Pain Score: 7  Pain Type: Chronic pain Pain Location: Other (Comment) (spinal and knee pain) Pain Descriptors / Indicators: Shooting, Aching Pain Onset: More than a month ago Pain Frequency: Constant     BMI - recorded: 27.26 Nutritional Status: BMI 25 -29 Overweight Nutritional Risks: None Diabetes: No  How often do you need to have someone help you when you read instructions, pamphlets, or other written materials from your doctor or pharmacy?: 1 - Never  Diabetic?No  Interpreter Needed?: No  Information entered by :: Charlott Rakes, LPN   Activities of Daily Living In your present state of health, do you have any difficulty performing the following activities: 07/27/2021 05/12/2021  Hearing? N N  Vision? N N  Difficulty concentrating or making decisions? N N  Walking or climbing stairs? Y N  Dressing or bathing? N N  Doing errands, shopping? N -  Preparing Food and eating ? N -  Using the Toilet? N -  In the past six months, have you accidently leaked urine? Y -  Comment stress incontinence and wears a panty liner -  Do you have problems with loss of bowel control? N -  Managing your Medications? N -  Managing your Finances? N -  Housekeeping or managing your Housekeeping? N -  Some recent data might be hidden    Patient Care Team: Vivi Barrack, MD as PCP - General (Family Medicine) Almedia Balls, MD as Consulting Physician (Orthopedic Surgery) Clydell Hakim, MD (Inactive) as Consulting Physician (Anesthesiology) Leighton Ruff, MD as Consulting Physician (Family Medicine) Shon Hough, MD as Consulting Physician (Ophthalmology)  Indicate any recent Banks you may have received from other than [facility] providers in the past year (date may be approximate).     Assessment:   This is a routine wellness examination for Analyce.  Hearing/Vision screen Hearing Screening - Comments:: Pt denies any hearing issues  Vision Screening - Comments:: Pt follows up with Dr Huston Foley practice for annual eye exams   Dietary issues and exercise activities discussed: Current Exercise Habits: The patient does not participate in regular exercise at present   Goals Addressed             This Visit's Progress    Patient Stated       Join silver sneakers        Depression Screen PHQ 2/9 Scores 07/27/2021 07/09/2021 11/26/2020  PHQ - 2 Score 1 0 0    Fall Risk Fall Risk  07/27/2021 11/26/2020  Falls in the past year? 0 0  Number falls in past yr: 0 -  Injury with Fall? 0 -  Risk for fall due to : Impaired vision;Impaired balance/gait;Impaired mobility -  Follow up Falls prevention discussed -    FALL RISK PREVENTION PERTAINING TO THE HOME:  Any stairs in or around the home? No  If so, are there any without handrails? No  Home free of loose throw rugs in walkways, pet beds, electrical cords, etc? Yes  Adequate lighting in your home to reduce  risk of falls? Yes   ASSISTIVE DEVICES UTILIZED TO PREVENT FALLS:  Life alert? No  Use of a cane, walker or w/c? Yes  Grab bars in the bathroom? Yes  Shower chair or bench in shower? Yes  Elevated toilet seat or a handicapped toilet? No   TIMED UP AND GO:  Was the test performed? No .  Cognitive Function:     6CIT Screen 07/27/2021  What Year? 0 points  What month? 0 points  What time? 0 points  Count back from 20 0 points  Months in reverse 0 points  Repeat phrase 0 points  Total Score 0    Immunizations Immunization History  Administered Date(s) Administered   Fluad Quad(high Dose 65+) 11/26/2020   Influenza Split 09/24/2015, 10/09/2017, 09/24/2019   Influenza, High Dose Seasonal PF 09/26/2014, 09/19/2017   Influenza,inj,Quad PF,6-35 Mos 10/10/2018   PFIZER(Purple Top)SARS-COV-2 Vaccination 01/28/2020, 02/22/2020   Pneumococcal Polysaccharide-23 09/30/2001   Tdap 04/19/2008   Unspecified SARS-COV-2 Vaccination 01/28/2020, 02/22/2020, 10/20/2020   Zoster, Live 03/22/2008, 03/22/2012    TDAP status: Due, Education has been provided regarding the importance of this vaccine. Advised may receive this vaccine at local pharmacy or Health Dept. Aware to provide a copy of the vaccination record if obtained from local pharmacy or Health Dept. Verbalized acceptance and understanding.  Flu Vaccine status: Due, Education has been provided regarding the importance of this  vaccine. Advised may receive this vaccine at local pharmacy or Health Dept. Aware to provide a copy of the vaccination record if obtained from local pharmacy or Health Dept. Verbalized acceptance and understanding.  Pneumococcal vaccine status: Due, Education has been provided regarding the importance of this vaccine. Advised may receive this vaccine at local pharmacy or Health Dept. Aware to provide a copy of the vaccination record if obtained from local pharmacy or Health Dept. Verbalized acceptance and  understanding.  Covid-19 vaccine status: Completed vaccines  Qualifies for Shingles Vaccine? Yes   Zostavax completed No   Shingrix Completed?: No.    Education has been provided regarding the importance of this vaccine. Patient has been advised to call insurance company to determine out of pocket expense if they have not yet received this vaccine. Advised may also receive vaccine at local pharmacy or Health Dept. Verbalized acceptance and understanding.  Screening Tests Health Maintenance  Topic Date Due   Zoster Vaccines- Shingrix (1 of 2) Never done   COLONOSCOPY (Pts 45-20yr Insurance coverage will need to be confirmed)  01/23/2011   PNA vac Low Risk Adult (1 of 2 - PCV13) 07/11/2014   TETANUS/TDAP  04/19/2018   COVID-19 Vaccine (4 - Booster) 01/20/2021   INFLUENZA VACCINE  07/20/2021   MAMMOGRAM  01/14/2023   DEXA SCAN  Completed   Hepatitis C Screening  Completed   HPV VACCINES  Aged Out    Health Maintenance  Health Maintenance Due  Topic Date Due   Zoster Vaccines- Shingrix (1 of 2) Never done   COLONOSCOPY (Pts 45-455yrInsurance coverage will need to be confirmed)  01/23/2011   PNA vac Low Risk Adult (1 of 2 - PCV13) 07/11/2014   TETANUS/TDAP  04/19/2018   COVID-19 Vaccine (4 - Booster) 01/20/2021   INFLUENZA VACCINE  07/20/2021    Colorectal cancer screening will discuss with gastrologist   Mammogram status: Completed 01/14/21. Repeat every year  Bone Density status: Completed 10/03/14. Results reflect: Bone density results: OSTEOPOROSIS. Repeat every 2 years.  Additional Screening:  Hepatitis C Screening:  Completed 03/31/21  Vision Screening: Recommended annual ophthalmology exams for early detection of glaucoma and other disorders of the eye. Is the patient up to date with their annual eye exam?  Yes  Who is the provider or what is the name of the office in which the patient attends annual eye exams? Dr StKathrin Pennerffice  If pt is not established with a  provider, would they like to be referred to a provider to establish care? No .   Dental Screening: Recommended annual dental exams for proper oral hygiene  Community Resource Referral / Chronic Care Management: CRR required this visit?  No   CCM required this visit?  No      Plan:     I have personally reviewed and noted the following in the patient's chart:   Medical and social history Use of alcohol, tobacco or illicit drugs  Current medications and supplements including opioid prescriptions.  Functional ability and status Nutritional status Physical activity Advanced directives List of other physicians Hospitalizations, surgeries, and ER visits in previous 12 months Vitals Screenings to include cognitive, depression, and falls Referrals and appointments  In addition, I have reviewed and discussed with patient certain preventive protocols, quality metrics, and best practice recommendations. A written personalized care plan for preventive services as well as general preventive health recommendations were provided to patient.     TiWillette BraceLPN   [id number] Nurse  Notes: None

## 2021-07-27 NOTE — Telephone Encounter (Signed)
Pt called to let Dr Jonni Sanger know that she is still congested and her ears are still bothering her. She would like a nurse to call her back.  Please Advise

## 2021-07-27 NOTE — Telephone Encounter (Signed)
Reached out to the pt to address concerns below. She says that she is currently speaking with Otila Kluver. I suggested if she is having further concerns about  her ears, she needs to follow up with Dr. Jerline Pain in office. She expressed understanding.

## 2021-07-29 DIAGNOSIS — I10 Essential (primary) hypertension: Secondary | ICD-10-CM | POA: Diagnosis not present

## 2021-07-29 DIAGNOSIS — Z6827 Body mass index (BMI) 27.0-27.9, adult: Secondary | ICD-10-CM | POA: Diagnosis not present

## 2021-07-29 DIAGNOSIS — G90523 Complex regional pain syndrome I of lower limb, bilateral: Secondary | ICD-10-CM | POA: Diagnosis not present

## 2021-07-31 ENCOUNTER — Telehealth: Payer: Self-pay

## 2021-07-31 ENCOUNTER — Other Ambulatory Visit: Payer: Self-pay | Admitting: Family Medicine

## 2021-07-31 MED ORDER — AZELASTINE-FLUTICASONE 137-50 MCG/ACT NA SUSP: 1.0000 | Freq: Two times a day (BID) | NASAL | 3 refills | Status: DC

## 2021-07-31 NOTE — Telephone Encounter (Signed)
Mediation has been sent to requested pharmacy.

## 2021-07-31 NOTE — Telephone Encounter (Signed)
Patient states that when she seen dr.andy on 7/21 she was told to take her nasal spray she had previously been prescribed by a physician at [facility] but now she is needing a refill on the nasal spray to continue use the nasal spray is Azelastine, HC 1 0.1% and if able to refill please send in to [place], [address]

## 2021-08-03 NOTE — Telephone Encounter (Signed)
Which one would you like to switch her to?

## 2021-08-08 ENCOUNTER — Other Ambulatory Visit: Payer: Self-pay | Admitting: Internal Medicine

## 2021-08-17 ENCOUNTER — Telehealth: Payer: Self-pay

## 2021-08-17 DIAGNOSIS — H6983 Other specified disorders of Eustachian tube, bilateral: Secondary | ICD-10-CM

## 2021-08-17 DIAGNOSIS — H9209 Otalgia, unspecified ear: Secondary | ICD-10-CM

## 2021-08-17 NOTE — Telephone Encounter (Signed)
Does pt need to see you again or ok to place referral to ENT?

## 2021-08-17 NOTE — Telephone Encounter (Signed)
Pt called stating that she is still having ear pain. She stated that she has medicine for her ears but it is not helping. Nichole Delgado stated that this has been going on almost 2 months and she is unsure what to do. Can a nurse call her? Please Advise.

## 2021-08-18 NOTE — Telephone Encounter (Signed)
Referral has been placed and pt made aware.

## 2021-08-18 NOTE — Telephone Encounter (Signed)
Ok to refer to ENT.  Algis Greenhouse. Jerline Pain, MD 08/18/2021 9:28 AM

## 2021-08-20 ENCOUNTER — Encounter (HOSPITAL_BASED_OUTPATIENT_CLINIC_OR_DEPARTMENT_OTHER): Payer: Self-pay | Admitting: Emergency Medicine

## 2021-08-20 ENCOUNTER — Other Ambulatory Visit: Payer: Self-pay

## 2021-08-20 ENCOUNTER — Emergency Department (HOSPITAL_BASED_OUTPATIENT_CLINIC_OR_DEPARTMENT_OTHER)
Admission: EM | Admit: 2021-08-20 | Discharge: 2021-08-20 | Disposition: A | Discharge status: Home / Self Care | Payer: Medicare Other | Attending: Emergency Medicine | Admitting: Emergency Medicine

## 2021-08-20 DIAGNOSIS — R0981 Nasal congestion: Secondary | ICD-10-CM | POA: Diagnosis not present

## 2021-08-20 DIAGNOSIS — R059 Cough, unspecified: Secondary | ICD-10-CM | POA: Insufficient documentation

## 2021-08-20 DIAGNOSIS — H9203 Otalgia, bilateral: Secondary | ICD-10-CM | POA: Diagnosis not present

## 2021-08-20 DIAGNOSIS — Z5321 Procedure and treatment not carried out due to patient leaving prior to being seen by health care provider: Secondary | ICD-10-CM | POA: Diagnosis not present

## 2021-08-20 NOTE — ED Triage Notes (Signed)
Cough ear aches both ears  and congestion ,  x 2 months saw her dr 1 1/2 month ago and given antiobiotics and finished  and nasal spray  states is no better

## 2021-08-22 DIAGNOSIS — J40 Bronchitis, not specified as acute or chronic: Secondary | ICD-10-CM | POA: Diagnosis not present

## 2021-08-22 DIAGNOSIS — J329 Chronic sinusitis, unspecified: Secondary | ICD-10-CM | POA: Diagnosis not present

## 2021-08-22 DIAGNOSIS — H6503 Acute serous otitis media, bilateral: Secondary | ICD-10-CM | POA: Diagnosis not present

## 2021-08-22 DIAGNOSIS — R059 Cough, unspecified: Secondary | ICD-10-CM | POA: Diagnosis not present

## 2021-08-31 ENCOUNTER — Telehealth: Payer: Self-pay | Admitting: *Deleted

## 2021-08-31 NOTE — Telephone Encounter (Signed)
Called and spoke with the patient and the patient stated that she had an ear and nose infection for two months and was on 2 rounds of antibiotics and felt hot and there was not any fever or chills and not any nausea and will not be able to get to the doctor until the end of the month and hardware that Dr Percell Miller done is where the pain is and I stated that we could see her or patient could see Dr Percell Miller since he is the one that knows the patient's history and patient agreed to call and make an appointment for Dr Percell Miller. Lattie Haw

## 2021-08-31 NOTE — Telephone Encounter (Signed)
Patient is calling with concerns about her foot at the injection site that was wet, swelling, area has become large. The draining has resolved but is pain where the hardware is located.Please schedule for an appointment for patient.

## 2021-09-02 DIAGNOSIS — I10 Essential (primary) hypertension: Secondary | ICD-10-CM | POA: Diagnosis not present

## 2021-09-02 DIAGNOSIS — Z9689 Presence of other specified functional implants: Secondary | ICD-10-CM | POA: Diagnosis not present

## 2021-09-02 DIAGNOSIS — G90523 Complex regional pain syndrome I of lower limb, bilateral: Secondary | ICD-10-CM | POA: Diagnosis not present

## 2021-09-02 DIAGNOSIS — Z6827 Body mass index (BMI) 27.0-27.9, adult: Secondary | ICD-10-CM | POA: Diagnosis not present

## 2021-09-08 ENCOUNTER — Telehealth: Payer: Self-pay | Admitting: Internal Medicine

## 2021-09-08 NOTE — Telephone Encounter (Signed)
Pt c/o medication issue:  1. Name of Medication: losartan (COZAAR) 25 MG tablet  2. How are you currently taking this medication (dosage and times per day)? Take 1 tablet by mouth once daily  3. Are you having a reaction (difficulty breathing--STAT)? yes  4. What is your medication issue? Severe pain from the neck down, severe leg and feet cramps

## 2021-09-08 NOTE — Telephone Encounter (Signed)
Returned call to patient of Dr. Debara Pickett regarding pain that she reports is from her losartan. She said she has severe pain from her neck down, severe cramps in legs/feet She said she never noticed the pain, b/c she has a spinal cord stimulator but but the stimulator off b/c the battery is dying.  She now reports the losartan is causing these symptoms and she is stopping the medication   She does not currently check her BP at home but plans to start  Will send to MD to review

## 2021-09-09 NOTE — Telephone Encounter (Signed)
Losartan removed from med list, as patient said she will no longer take this  Advised to call or send MyChart message w/BP readings in 10-14 days so MD can advise on what med to start

## 2021-09-09 NOTE — Telephone Encounter (Signed)
Highly unlikely to be the losartan - but thanks for updating me. Cannot advise on other meds without BP readings.  Dr Lemmie Evens

## 2021-09-10 ENCOUNTER — Telehealth (INDEPENDENT_AMBULATORY_CARE_PROVIDER_SITE_OTHER): Payer: Medicare Other | Admitting: Physician Assistant

## 2021-09-10 ENCOUNTER — Encounter: Payer: Self-pay | Admitting: Physician Assistant

## 2021-09-10 DIAGNOSIS — R058 Other specified cough: Secondary | ICD-10-CM

## 2021-09-10 DIAGNOSIS — Z20822 Contact with and (suspected) exposure to covid-19: Secondary | ICD-10-CM | POA: Diagnosis not present

## 2021-09-10 NOTE — Progress Notes (Signed)
Virtual Visit via Telephone Note  I connected with Nichole Delgado on 09/10/21 at  4:00 PM EDT by telephone and verified that I am speaking with the correct person using two identifiers.  Location: Patient: home Provider: Therapist, music at [employer]  I discussed the limitations, risks, security and privacy concerns of performing an evaluation and management service by telephone and the availability of in person appointments. I also discussed with the patient that there may be a patient responsible charge related to this service. The patient expressed understanding and agreed to proceed.   History of Present Illness: Chief complaint: COVID exposure Symptom onset: 3 months  Pertinent positives: Nasal congestion, ear pain, cough  Pertinent negatives: chest pain, SOB, chills, body aches  Treatments tried: "2 rounds of antibiotics, nasal spray, steroids, cough supplements" Vaccine status: Vaccinated for COVID Sick exposure: Husband diagnosed with COVID-19 yesterday    Observations/Objective:  Speaking clearly in full sentences without signs of distress.  Assessment and Plan: 1. Cough with exposure to COVID-19 virus -New COVID exposure on top of her sinus issues the last 3 months -She will come to office today for COVID-19 PCR test parking lot visit with nurse -Advised to quarantine at home until result is back -Hopefully she can keep appointment with ENT on Monday about her sinus issues.   Follow Up Instructions:    I discussed the assessment and treatment plan with the patient. The patient was provided an opportunity to ask questions and all were answered. The patient agreed with the plan and demonstrated an understanding of the instructions.   The patient was advised to call back or seek an in-person evaluation if the symptoms worsen or if the condition fails to improve as anticipated.  I provided 6 minutes 31 seconds of non-face-to-face time during this  encounter.   Jobie Popp M Emelee Rodocker, PA-C

## 2021-09-11 LAB — SARS-COV-2, NAA 2 DAY TAT

## 2021-09-11 LAB — NOVEL CORONAVIRUS, NAA: SARS-CoV-2, NAA: DETECTED — AB

## 2021-09-25 DIAGNOSIS — M4327 Fusion of spine, lumbosacral region: Secondary | ICD-10-CM | POA: Diagnosis not present

## 2021-09-25 DIAGNOSIS — G894 Chronic pain syndrome: Secondary | ICD-10-CM | POA: Diagnosis not present

## 2021-09-25 DIAGNOSIS — Z6826 Body mass index (BMI) 26.0-26.9, adult: Secondary | ICD-10-CM | POA: Diagnosis not present

## 2021-09-25 DIAGNOSIS — I10 Essential (primary) hypertension: Secondary | ICD-10-CM | POA: Diagnosis not present

## 2021-09-28 ENCOUNTER — Telehealth: Payer: Self-pay | Admitting: Internal Medicine

## 2021-09-28 NOTE — Telephone Encounter (Signed)
Spoke with pt regarding shortness of breath. Pt explains that she has been dealing with sinus and ear problems since July, that has required antibiotics and steriods.  Pt was diagnosed with Covid about 2.5 weeks ago. Pt also dealing with issues with her spinal stimulator. Pt states that she went to a doctor appointmnet on Friday and her blood pressure was elevated at 169/88.  Pt states blood pressure over the last week has ranged from 150-160/80s.  Per chart pt recently (9/20) stopped taking losartan because she believed it was causing leg cramps. Cramps have not changed or gone away. Pt states that she is experiencing headaches as well. Will send to provider to advise.

## 2021-09-28 NOTE — Telephone Encounter (Signed)
Pt c/o Shortness Of Breath: STAT if SOB developed within the last 24 hours or pt is noticeably SOB on the phone  1. Are you currently SOB (can you hear that pt is SOB on the phone)? SOB  2. How long have you been experiencing SOB? A few months; increasingly gotten worse  3. Are you SOB when sitting or when up moving around? When waking up  4. Are you currently experiencing any other symptoms? Lightheadedness, headache, nausea; high BP

## 2021-09-30 MED ORDER — LOSARTAN POTASSIUM 25 MG PO TABS: 25.0000 mg | ORAL_TABLET | Freq: Every day | ORAL | 1 refills | Status: DC

## 2021-09-30 NOTE — Telephone Encounter (Signed)
Losartan would not cause leg cramps - would restart the medication as the blood pressure is too high - it's not unusual to be short of breath, sometimes for weeks after COVID - so I suspect related to that.  Dr. Lemmie Evens

## 2021-09-30 NOTE — Telephone Encounter (Signed)
Spoke with patient and relayed MD advice. She threw away her losartan 25mg  pills so these have been refilled.

## 2021-10-01 DIAGNOSIS — F17210 Nicotine dependence, cigarettes, uncomplicated: Secondary | ICD-10-CM | POA: Diagnosis not present

## 2021-10-01 DIAGNOSIS — J3489 Other specified disorders of nose and nasal sinuses: Secondary | ICD-10-CM | POA: Diagnosis not present

## 2021-10-01 DIAGNOSIS — M26623 Arthralgia of bilateral temporomandibular joint: Secondary | ICD-10-CM | POA: Diagnosis not present

## 2021-10-01 DIAGNOSIS — H903 Sensorineural hearing loss, bilateral: Secondary | ICD-10-CM | POA: Diagnosis not present

## 2021-10-01 DIAGNOSIS — H6983 Other specified disorders of Eustachian tube, bilateral: Secondary | ICD-10-CM | POA: Diagnosis not present

## 2021-10-05 ENCOUNTER — Telehealth: Payer: Self-pay | Admitting: Internal Medicine

## 2021-10-05 NOTE — Telephone Encounter (Signed)
*  STAT* If patient is at the pharmacy, call can be transferred to refill team.   1. Which medications need to be refilled? (please list name of each medication and dose if known)  losartan (COZAAR) 25 MG tablet  2. Which pharmacy/location (including street and city if local pharmacy) is medication to be sent to? [place], [place]  3. Do they need a 30 day or 90 day supply? 90   Patient said her pharmacy never got her new rx. Please re-send rx

## 2021-10-09 DIAGNOSIS — N3642 Intrinsic sphincter deficiency (ISD): Secondary | ICD-10-CM | POA: Diagnosis not present

## 2021-10-12 DIAGNOSIS — K580 Irritable bowel syndrome with diarrhea: Secondary | ICD-10-CM | POA: Diagnosis not present

## 2021-10-15 DIAGNOSIS — N3941 Urge incontinence: Secondary | ICD-10-CM | POA: Diagnosis not present

## 2021-10-15 DIAGNOSIS — N393 Stress incontinence (female) (male): Secondary | ICD-10-CM | POA: Diagnosis not present

## 2021-10-15 DIAGNOSIS — R3915 Urgency of urination: Secondary | ICD-10-CM | POA: Diagnosis not present

## 2021-10-16 DIAGNOSIS — M4327 Fusion of spine, lumbosacral region: Secondary | ICD-10-CM | POA: Diagnosis not present

## 2021-10-16 DIAGNOSIS — Z6826 Body mass index (BMI) 26.0-26.9, adult: Secondary | ICD-10-CM | POA: Diagnosis not present

## 2021-10-16 DIAGNOSIS — I10 Essential (primary) hypertension: Secondary | ICD-10-CM | POA: Diagnosis not present

## 2021-10-18 DIAGNOSIS — Z23 Encounter for immunization: Secondary | ICD-10-CM | POA: Diagnosis not present

## 2021-10-22 ENCOUNTER — Encounter: Payer: Self-pay | Admitting: Family Medicine

## 2021-10-22 ENCOUNTER — Ambulatory Visit (INDEPENDENT_AMBULATORY_CARE_PROVIDER_SITE_OTHER): Payer: Medicare Other | Admitting: Family Medicine

## 2021-10-22 ENCOUNTER — Other Ambulatory Visit: Payer: Self-pay

## 2021-10-22 VITALS — BP 127/79 | HR 79 | Temp 97.8°F | Ht <= 58 in | Wt 132.0 lb

## 2021-10-22 DIAGNOSIS — M1990 Unspecified osteoarthritis, unspecified site: Secondary | ICD-10-CM | POA: Diagnosis not present

## 2021-10-22 DIAGNOSIS — F17210 Nicotine dependence, cigarettes, uncomplicated: Secondary | ICD-10-CM

## 2021-10-22 DIAGNOSIS — J42 Unspecified chronic bronchitis: Secondary | ICD-10-CM | POA: Diagnosis not present

## 2021-10-22 DIAGNOSIS — G5770 Causalgia of unspecified lower limb: Secondary | ICD-10-CM

## 2021-10-22 DIAGNOSIS — F17200 Nicotine dependence, unspecified, uncomplicated: Secondary | ICD-10-CM

## 2021-10-22 MED ORDER — PREDNISONE 50 MG PO TABS: ORAL_TABLET | ORAL | 0 refills | Status: DC

## 2021-10-22 MED ORDER — AZITHROMYCIN 250 MG PO TABS: ORAL_TABLET | ORAL | 0 refills | Status: DC

## 2021-10-22 NOTE — Patient Instructions (Signed)
It was very nice to see you today!  Start azithromycin and prednisone.  I will refer you to see the lung doctor.  We will also refer you to see a specialist for your CRPS.  Take care, Dr Jerline Pain  PLEASE NOTE:  If you had any lab tests please let us know if you have not heard back within a few days. You may see your results on mychart before we have a chance to review them but we will give you a call once they are reviewed by [place]. If we ordered any referrals today, please let us know if you have not heard from their office within the next week.   Please try these tips to maintain a healthy lifestyle:  Eat at least 3 REAL meals and 1-2 snacks per day.  Aim for no more than 5 hours between eating.  If you eat breakfast, please do so within one hour of getting up.   Each meal should contain half fruits/vegetables, one quarter protein, and one quarter carbs (no bigger than a computer mouse)  Cut down on sweet beverages. This includes juice, soda, and sweet tea.   Drink at least 1 glass of water with each meal and aim for at least 8 glasses per day  Exercise at least 150 minutes every week.

## 2021-10-22 NOTE — Assessment & Plan Note (Signed)
Patient was asked about her tobacco use today and was strongly advised to quit. Patient is currently noncontemplative2. We reviewed treatment options to assist her quit smoking including NRT, Chantix, and Bupropion. Follow up at next office visit.   Total time spent counseling approximately 3 minutes.

## 2021-10-22 NOTE — Assessment & Plan Note (Signed)
Acute flare.  Recently had COVID which may be contributing.  No signs of respiratory shortness of breath Lax-A-Day.  We will treat her flare with course of prednisone and azithromycin.  She has been using albuterol inhaler as well.  We will refer to pulmonology.  We will likely need repeat PFTs.

## 2021-10-22 NOTE — Progress Notes (Addendum)
   Nichole Delgado is a 70 y.o. female who presents today for an office visit.  Assessment/Plan:  Chronic Problems Addressed Today: Chronic bronchitis (HCC) Acute flare.  Recently had COVID which may be contributing.  No signs of respiratory shortness of breath Lax-A-Day.  We will treat her flare with course of prednisone and azithromycin.  She has been using albuterol inhaler as well.  We will refer to pulmonology.  We will likely need repeat PFTs.  CRPS (complex regional pain syndrome), lower limb with spinal cord simulator Not controlled.  Her previous pain doctor is retiring.  She does have a spinal stimulator in place however will need to be replaced soon.  Will referral to PM&R to establish care locally. She has been managed previously on tramadol.  Nicotine dependence with current use Patient was asked about her tobacco use today and was strongly advised to quit. Patient is currently noncontemplative2. We reviewed treatment options to assist her quit smoking including NRT, Chantix, and Bupropion. Follow up at next office visit.   Total time spent counseling approximately 3 minutes.      Subjective:  HPI:  See A/p for status of chronic conditions. She has had a flare of her chronic bronchitis the last few weeks.  She had COVID about 6 weeks ago.  She is also been having issues with sinus infection.  Is been given multiple rounds of antibiotics and prednisone.  She has noticed more wheezing and coughing recently. No fevers or chills.   She has not seen a pulmonologist for about 4 years.  Albuterol seems to help.  She has no wheezing.  Symptoms are stable over the last few weeks.         Objective:  Physical Exam: BP 127/79   Pulse 79   Temp 97.8 F (36.6 C) (Temporal)   Ht 4\' 10"  (1.473 m)   Wt 132 lb (59.9 kg)   SpO2 95%   BMI 27.59 kg/m   Gen: No acute distress, resting comfortably CV: Regular rate and rhythm with no murmurs appreciated Pulm: Normal work of breathing,  bibasilar crackles with faint end expiratory wheezes noted Neuro: Grossly normal, moves all extremities Psych: Normal affect and thought content       I,Savera Zaman,acting as a scribe for Dimas Chyle, MD.,have documented all relevant documentation on the behalf of Dimas Chyle, MD,as directed by  Dimas Chyle, MD while in the presence of Dimas Chyle, MD.   I, Dimas Chyle, MD, have reviewed all documentation for this visit. The documentation on 10/22/21 for the exam, diagnosis, procedures, and orders are all accurate and complete.  Algis Greenhouse. Jerline Pain, MD 10/22/2021 2:39 PM

## 2021-10-22 NOTE — Assessment & Plan Note (Addendum)
Not controlled.  Her previous pain doctor is retiring.  She does have a spinal stimulator in place however will need to be replaced soon.  Will referral to PM&R to establish care locally. She has been managed previously on tramadol.

## 2021-10-23 DIAGNOSIS — M25571 Pain in right ankle and joints of right foot: Secondary | ICD-10-CM | POA: Diagnosis not present

## 2021-10-26 ENCOUNTER — Ambulatory Visit: Payer: Medicare Other

## 2021-10-26 DIAGNOSIS — M79671 Pain in right foot: Secondary | ICD-10-CM | POA: Diagnosis not present

## 2021-10-28 DIAGNOSIS — N393 Stress incontinence (female) (male): Secondary | ICD-10-CM | POA: Diagnosis not present

## 2021-11-02 ENCOUNTER — Encounter: Payer: Self-pay | Admitting: Family Medicine

## 2021-11-02 ENCOUNTER — Telehealth (INDEPENDENT_AMBULATORY_CARE_PROVIDER_SITE_OTHER): Payer: Medicare Other | Admitting: Family Medicine

## 2021-11-02 VITALS — Temp 98.1°F | Ht <= 58 in | Wt 132.0 lb

## 2021-11-02 DIAGNOSIS — M792 Neuralgia and neuritis, unspecified: Secondary | ICD-10-CM | POA: Diagnosis not present

## 2021-11-02 DIAGNOSIS — I10 Essential (primary) hypertension: Secondary | ICD-10-CM

## 2021-11-02 DIAGNOSIS — G5770 Causalgia of unspecified lower limb: Secondary | ICD-10-CM

## 2021-11-02 DIAGNOSIS — J42 Unspecified chronic bronchitis: Secondary | ICD-10-CM | POA: Diagnosis not present

## 2021-11-02 DIAGNOSIS — G43809 Other migraine, not intractable, without status migrainosus: Secondary | ICD-10-CM

## 2021-11-02 NOTE — Assessment & Plan Note (Signed)
Referral to pulmonology is pending.  We treated her for her acute flare a couple weeks ago with prednisone and azithromycin.  Symptoms have improved.

## 2021-11-02 NOTE — Assessment & Plan Note (Signed)
Referral to PM&R pending.

## 2021-11-02 NOTE — Assessment & Plan Note (Addendum)
Stable on losartan 25mg  daily.

## 2021-11-02 NOTE — Assessment & Plan Note (Signed)
Will place referral to establish with new neurologist given that her previous one retired.  She is not currently on any medications for this.

## 2021-11-02 NOTE — Assessment & Plan Note (Signed)
We will refer to establish care with new neurologist.  Migraines are relatively stable.

## 2021-11-02 NOTE — Progress Notes (Signed)
   Nichole Delgado is a 70 y.o. female who presents today for a virtual office visit.  Assessment/Plan:  Chronic Problems Addressed Today: Peripheral neuropathic pain Will place referral to establish with new neurologist given that her previous one retired.  She is not currently on any medications for this.  Migraine We will refer to establish care with new neurologist.  Migraines are relatively stable.  Chronic bronchitis ([place]) Referral to pulmonology is pending.  We treated her for her acute flare a couple weeks ago with prednisone and azithromycin.  Symptoms have improved.  CRPS (complex regional pain syndrome), lower limb with spinal cord simulator Referral to PM&R pending.   Essential hypertension Stable on losartan 25mg  daily.     Subjective:  HPI:  See A/p for status of chronic conditions.  She has been following with neurology for her peripheral neuropathy, migraine, vertigo, and complex regional pain syndrome.  Her neurologist has recently retired and she would like to establish care with a different neurologist.  Her symptoms are currently stable however her complex regional pain syndrome still causes her significant issues.        Objective/Observations  Physical Exam: Gen: NAD, resting comfortably Pulm: Normal work of breathing Neuro: Grossly normal, moves all extremities Psych: Normal affect and thought content  Virtual Visit via Video   I connected with Nichole Delgado on 11/02/21 at 10:00 AM EST by a video enabled telemedicine application and verified that I am speaking with the correct person using two identifiers. The limitations of evaluation and management by telemedicine and the availability of in person appointments were discussed. The patient expressed understanding and agreed to proceed.   Patient location: Home Provider location: [place] participating in the virtual visit: Myself and Patient     Algis Greenhouse.  Jerline Pain, MD 11/02/2021 10:17 AM

## 2021-11-05 ENCOUNTER — Other Ambulatory Visit: Payer: Self-pay

## 2021-11-05 ENCOUNTER — Ambulatory Visit (INDEPENDENT_AMBULATORY_CARE_PROVIDER_SITE_OTHER): Payer: Medicare Other | Admitting: Pulmonary Disease

## 2021-11-05 ENCOUNTER — Encounter: Payer: Self-pay | Admitting: Pulmonary Disease

## 2021-11-05 VITALS — BP 104/80 | HR 70 | Temp 97.8°F | Ht <= 58 in | Wt 133.8 lb

## 2021-11-05 DIAGNOSIS — R0689 Other abnormalities of breathing: Secondary | ICD-10-CM | POA: Diagnosis not present

## 2021-11-05 DIAGNOSIS — I2510 Atherosclerotic heart disease of native coronary artery without angina pectoris: Secondary | ICD-10-CM | POA: Diagnosis not present

## 2021-11-05 DIAGNOSIS — J410 Simple chronic bronchitis: Secondary | ICD-10-CM | POA: Diagnosis not present

## 2021-11-05 NOTE — Progress Notes (Signed)
Subjective:    Patient ID: Nichole Delgado, female    DOB: 11/18/1951, 70 y.o.   MRN: [id number]  HPI  70 year old heavy smoker presents for evaluation of shortness of breath.  Note evaluation in 2019 by my partner Dr. Vaughan Browner when she presented with groundglass opacity in the left upper lobe.  This was stable on follow-up CT in 2-year follow-up imaging was recommended.  Surprisingly PFTs did not show significant airway obstruction, smoking cessation was advised She had a follow-up CT scan 06/2020 which showed stable nodule  She reports shortness of breath for about a year.  She reports COVID infection in October 2022.  Since then she has had at least 4 episodes where she has woken up gasping for breath twice in the middle of night and twice early morning.  This has not occurred in the last 2 weeks. She reports occasional wheezing and cough with clear sputum especially in the morning  She continues to smoke about a pack per day, more than 50 pack years   PMH -complex regional pain syndrome after a fall and fractures of both ankles with right foot drop status post spinal cord stimulator, -IBS with diarrhea  Bed partner history is not available, reports sleepiness score is 5, she denies daytime naps. Bedtime is between 9 and 11 PM, TV stays on through the night, she sleeps on her side with 1 pillow, is out of bed at 7 AM, denies nocturnal awakenings and is sluggish in the morning on first waking up before she gets going There is no history suggestive of cataplexy, sleep paralysis or parasomnias    Significant tests/ events reviewed  CT chest 04/2017-10 x 7 mm groundglass opacity in the left upper lobe CT chest 11/2017- stable 10 x 7 mm groundglass opacity in the left upper lobe.  Improvement in small nodular opacities on the right.  CT chest 06/2020 stable 10 x 7 mm left upper lobe nodule   PFTs 01/2018 FVC 2.10 [81%], FEV1 1.64 [84%], F/F 78, TLC 91%, RV/TLC 128%, DLCO 81% Minimal  obstruction, small airways disease   FENO 02/13/18-7   Past Medical History:  Diagnosis Date   Cervical spondylosis    Complex regional pain syndrome I    followed by pain clinic, pcp,  has spinal cord stimulator   DDD (degenerative disc disease), lumbar    GAD (generalized anxiety disorder)    GERD (gastroesophageal reflux disease)    History of basal cell carcinoma (BCC) excision    per pt moh's sx on nose in 2020   History of chronic bronchitis    uses inhaler prn   Hypertension    followed by pcp and dr hilty   Irritable bowel syndrome with diarrhea    followed by dr Watt Climes (gi)   Lesion of bladder    Migraines    neurology-- [facility] in [place]   Mixed dyslipidemia    followed by lipid clinic--- dr Debara Pickett   Neuropathy    OA (osteoarthritis)    PONV (postoperative nausea and vomiting)    severe   Pre-diabetes    S/P insertion of spinal 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   Stress incontinence    SUI (stress urinary incontinence, female)    Thyroid cyst    04-24-2021  pt stated recently dx and has been referred to endocrinologist   Vertigo    Wears glasses    Past Surgical History:  Procedure Laterality Date   BACK SURGERY     BLADDER SURGERY  05/12/2021   for stress incontinence per pt   CATARACT EXTRACTION W/ INTRAOCULAR LENS  IMPLANT, BILATERAL  2005   COLONOSCOPY W/ POLYPECTOMY     CYSTOSCOPY WITH BIOPSY N/A 05/12/2021   Procedure: CYSTOSCOPY WITH BIOPSY AND FULGERATION;  Surgeon: Robley Fries, MD;  Location: [facility];  Service: Urology;  Laterality: N/A;  30 MINS   FOOT SURGERY Right 2011   hammer toe   ORIF ANKLE FRACTURE Left 03/27/2017   Procedure: OPEN REDUCTION INTERNAL FIXATION (ORIF) BIMALLEOLAR ANKLE FRACTURE;  Surgeon: Renette Butters, MD;  Location: [facility];  Service: Orthopedics;  Laterality: Left;   ORIF CALCANEOUS FRACTURE Right 04/05/2017   Procedure: OPEN REDUCTION INTERNAL FIXATION (ORIF) CALCANEOUS AND FIBULA  FRACTURE;  Surgeon: Renette Butters, MD;  Location: [facility];  Service: Orthopedics;  Laterality: Right;   POSTERIOR LUMBAR FUSION  09-08-2020  @[facility]    L5--S1   SPINAL CORD STIMULATOR INSERTION  08/15/2018   SUPRACERVICAL ABDOMINAL HYSTERECTOMY  1988   TONSILLECTOMY AND ADENOIDECTOMY  child   Allergies  Allergen Reactions   Penicillins Hives    Has patient had a PCN reaction causing immediate rash, facial/tongue/throat swelling, SOB or lightheadedness with hypotension: No Has patient had a PCN reaction causing severe rash involving mucus membranes or skin necrosis: Yes Has patient had a PCN reaction that required hospitalization No Has patient had a PCN reaction occurring within the last 10 years: No If all of the above answers are "NO", then may proceed with Cephalosporin use.    Sulfa Antibiotics Hives and Rash   Other     States she has allergies to other seizure medications but doesn't know which ones   Codeine Nausea Only   Hydrocodone Rash   Keppra [Levetiracetam] Rash   Topamax [Topiramate] Nausea Only    Weight loss    Social History   Socioeconomic History   Marital status: Married    Spouse name: Not on file   Number of children: 1   Years of education: 12   Highest education level: Not on file  Occupational History   Occupation: N/A  Tobacco Use   Smoking status: Every Day    Packs/day: 1.00    Years: 45.00    Pack years: 45.00    Types: Cigarettes   Smokeless tobacco: Never  Vaping Use   Vaping Use: Never used  Substance and Sexual Activity   Alcohol use: Not Currently    Alcohol/week: 0.0 standard drinks   Drug use: Never   Sexual activity: Not on file  Other Topics Concern   Not on file  Social History Narrative   Lives at home with her husband   Right-handed   Drinks about 5 cups of coffee per day   Social Determinants of Health   Financial Resource Strain: Low Risk    Difficulty of Paying Living Expenses: Not hard at all  Food Insecurity: No  Food Insecurity   Worried About Charity fundraiser in the Last Year: Never true   Arboriculturist in the Last Year: Never true  Transportation Needs: No Transportation Needs   Lack of Transportation (Medical): No   Lack of Transportation (Non-Medical): No  Physical Activity: Inactive   Days of Exercise per Week: 0 days   Minutes of Exercise per Session: 0 min  Stress: No Stress Concern Present   Feeling of Stress : Not at all  Social Connections: Moderately Isolated   Frequency of Communication with Friends and Family: More  than three times a week   Frequency of Social Gatherings with Friends and Family: More than three times a week   Attends Religious Services: Never   Marine scientist or Organizations: No   Attends Music therapist: Never   Marital Status: Married  Human resources officer Violence: Not At Risk   Fear of Current or Ex-Partner: No   Emotionally Abused: No   Physically Abused: No   Sexually Abused: No     Family History  Problem Relation Age of Onset   Heart attack Father    Migraines Mother    Cancer Sister        Pancreatic   Cancer Brother        Liver   Migraines Daughter    Breast cancer Neg Hx       Review of Systems Constitutional: negative for anorexia, fevers and sweats  Eyes: negative for irritation, redness and visual disturbance  Ears, nose, mouth, throat, and face: negative for earaches, epistaxis, nasal congestion and sore throat   Cardiovascular: negative for chest pain,  lower extremity edema, orthopnea, palpitations and syncope  Gastrointestinal: negative for abdominal pain, constipation, diarrhea, melena, nausea and vomiting  Genitourinary:negative for dysuria, frequency and hematuria  Hematologic/lymphatic: negative for bleeding, easy bruising and lymphadenopathy  Musculoskeletal:negative for arthralgias, muscle weakness and stiff joints  Neurological: negative for coordination problems, gait problems, headaches and  weakness  Endocrine: negative for diabetic symptoms including polydipsia, polyuria and weight loss     Objective:   Physical Exam  Gen. Pleasant, thin, short, woman in no distress, normal affect ENT - no pallor,icterus, no post nasal drip Neck: No JVD, no thyromegaly, no carotid bruits Lungs: no use of accessory muscles, no dullness to percussion, decreased bilateral without rales or rhonchi  Cardiovascular: Rhythm regular, heart sounds  normal, no murmurs or gallops, no peripheral edema Abdomen: soft and non-tender, no hepatosplenomegaly, BS normal. Musculoskeletal: No deformities, no cyanosis or clubbing Neuro:  alert, non focal       Assessment & Plan:    Since gasping episodes occur mostly at night, would recommend home sleep test to rule out obstructive sleep apnea.  She is not very keen on having this since she does not want CPAP therapy, we will pursue this only if spirometry is normal.   Pulmonary nodule left upper lobe-appears stable since 2019 and very likely benign, she can continue lung cancer screening every 2 years

## 2021-11-05 NOTE — Patient Instructions (Signed)
Use albuterol MDI 2 puffs as needed for wheezing   Schedule spirometry pre-post  If this is normal, consider home sleep test  Rx for nicotrol inhaler

## 2021-11-05 NOTE — Assessment & Plan Note (Signed)
Surprisingly PFTs in 2019 did not show significant airway obstruction. We will repeat spirometry to see if lung function has dropped further ongoing smoking.  I have asked her to use albuterol on an as-needed basis especially for wheezing but if FEV1 has dropped then we will consider placing her on LABA/LAMA combination

## 2021-11-09 ENCOUNTER — Ambulatory Visit: Payer: Medicare Other

## 2021-11-10 ENCOUNTER — Ambulatory Visit: Payer: Medicare Other | Admitting: Family

## 2021-11-11 DIAGNOSIS — M79671 Pain in right foot: Secondary | ICD-10-CM | POA: Diagnosis not present

## 2021-11-16 DIAGNOSIS — H938X2 Other specified disorders of left ear: Secondary | ICD-10-CM | POA: Diagnosis not present

## 2021-11-16 DIAGNOSIS — H6983 Other specified disorders of Eustachian tube, bilateral: Secondary | ICD-10-CM | POA: Diagnosis not present

## 2021-11-16 DIAGNOSIS — M26609 Unspecified temporomandibular joint disorder, unspecified side: Secondary | ICD-10-CM | POA: Diagnosis not present

## 2021-11-24 ENCOUNTER — Other Ambulatory Visit: Payer: Self-pay

## 2021-11-24 ENCOUNTER — Encounter: Payer: Self-pay | Admitting: Rehabilitative and Restorative Service Providers"

## 2021-11-24 ENCOUNTER — Ambulatory Visit: Payer: Medicare Other | Attending: Orthopaedic Surgery | Admitting: Rehabilitative and Restorative Service Providers"

## 2021-11-24 DIAGNOSIS — R2689 Other abnormalities of gait and mobility: Secondary | ICD-10-CM | POA: Diagnosis not present

## 2021-11-24 DIAGNOSIS — M6281 Muscle weakness (generalized): Secondary | ICD-10-CM | POA: Insufficient documentation

## 2021-11-24 DIAGNOSIS — M25571 Pain in right ankle and joints of right foot: Secondary | ICD-10-CM | POA: Diagnosis not present

## 2021-11-24 DIAGNOSIS — R262 Difficulty in walking, not elsewhere classified: Secondary | ICD-10-CM | POA: Diagnosis not present

## 2021-11-24 DIAGNOSIS — R42 Dizziness and giddiness: Secondary | ICD-10-CM | POA: Insufficient documentation

## 2021-11-24 NOTE — Therapy (Signed)
[facility] @ [place], [place], [address] Phone: [phone]   Fax:  [phone]  Physical Therapy Evaluation  Patient Details  Name: Nichole Delgado MRN: [id number] Date of Birth: [birth year] Referring Provider (PT): Dr Melony Overly   Encounter Date: 11/24/2021   PT End of Session - 11/24/21 1529     Visit Number 1    Date for PT Re-Evaluation 01/15/22    Authorization Type Medicare/BCBS    Progress Note Due on Visit 10    PT Start Time 7322    PT Stop Time 1525    PT Time Calculation (min) 40 min    Activity Tolerance Patient tolerated treatment well    Behavior During Therapy [facility] for tasks assessed/performed             Past Medical History:  Diagnosis Date   Cervical spondylosis    Complex regional pain syndrome I    followed by pain clinic, pcp,  has spinal cord stimulator   DDD (degenerative disc disease), lumbar    GAD (generalized anxiety disorder)    GERD (gastroesophageal reflux disease)    History of basal cell carcinoma (BCC) excision    per pt moh's sx on nose in 2020   History of chronic bronchitis    uses inhaler prn   Hypertension    followed by pcp and dr hilty   Irritable bowel syndrome with diarrhea    followed by dr Watt Climes (gi)   Lesion of bladder    Migraines    neurology-- [facility] [place]   Mixed dyslipidemia    followed by lipid clinic--- dr Debara Pickett   Neuropathy    OA (osteoarthritis)    PONV (postoperative nausea and vomiting)    severe   Pre-diabetes    S/P insertion of spinal 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   Stress incontinence    SUI (stress urinary incontinence, female)    Thyroid cyst    04-24-2021  pt stated recently dx and has been referred to endocrinologist   Vertigo    Wears glasses     Past Surgical History:  Procedure Laterality Date   BACK SURGERY     BLADDER SURGERY  05/12/2021   for stress incontinence per pt    CATARACT EXTRACTION W/ INTRAOCULAR LENS  IMPLANT, BILATERAL  2005   COLONOSCOPY W/ POLYPECTOMY     CYSTOSCOPY WITH BIOPSY N/A 05/12/2021   Procedure: [place];  Surgeon: Robley Fries, MD;  Location: [facility];  Service: Urology;  Laterality: N/A;  30 MINS   FOOT SURGERY Right 2011   hammer toe   ORIF ANKLE FRACTURE Left 03/27/2017   Procedure: OPEN REDUCTION INTERNAL FIXATION (ORIF) BIMALLEOLAR ANKLE FRACTURE;  Surgeon: Renette Butters, MD;  Location: [facility];  Service: Orthopedics;  Laterality: Left;   ORIF CALCANEOUS FRACTURE Right 04/05/2017   Procedure: OPEN REDUCTION INTERNAL FIXATION (ORIF) CALCANEOUS AND FIBULA FRACTURE;  Surgeon: Renette Butters, MD;  Location: [facility];  Service: Orthopedics;  Laterality: Right;   POSTERIOR LUMBAR FUSION  09-08-2020  @[facility]    L5--S1   SPINAL CORD STIMULATOR INSERTION  08/15/2018   SUPRACERVICAL ABDOMINAL HYSTERECTOMY  1988   TONSILLECTOMY AND ADENOIDECTOMY  child    There were no vitals filed for this visit.    Subjective Assessment - 11/24/21 1454     Subjective Pt reports that she had R ankle surgery in April 2018. She reports that she was  initially doing well, but has been having more problems with her R ankle/foot since. Pt has neurostimulator due to complex regional pain sydrome. Pt reports that she has pain in her foot with sitting, but pain is relieved with standing.  Pt states that she is having difficulty sleeping at night secondary to pain. Pt reports that in 2018 she fell through an attic and broke B ankle bones requiring surgery and was in rehab for extended period of time.  Pt states that she is having some numbness in her L foot, as well, but it is not as bad as R foot.    Pertinent History mirgraine, chronic bronchitis, CRPS lower limb with spinal cord stimulator, essential HTN    How long can you sit comfortably? 30 min if feet supported with slippers on.  If dependent position sitting,  can only tolerate 5 min.    How long can you walk comfortably? 5-10 min    Patient Stated Goals I would like to be more acitve, to be able to shop and travel without pain.    Currently in Pain? Yes    Pain Score 7     Pain Location Foot    Pain Orientation Right    Pain Descriptors / Indicators Cramping;Radiating;Sharp    Pain Type Chronic pain    Pain Radiating Towards up towards calf    Aggravating Factors  sitting in dependent position                [facility] PT Assessment - 11/24/21 0001       Assessment   Medical Diagnosis M79.671 (ICD-10-CM) - Pain in right foot  M25.579 (ICD-10-CM) - Ankle pain    Referring Provider (PT) Dr Melony Overly    Onset Date/Surgical Date 03/26/17    Hand Dominance Right    Next MD Visit as needed    Prior Therapy yes, following surgery      Precautions   Precautions None      Balance Screen   Has the patient fallen in the past 6 months No    Has the patient had a decrease in activity level because of a fear of falling?  Yes    Is the patient reluctant to leave their home because of a fear of falling?  No      Home Ecologist residence    Living Arrangements Spouse/significant other    Type of [place] to enter;Ramped entrance    [place] of Steps [place] One level    [place] - 2 wheels;Walker - 4 wheels;Cane - single point;Shower seat      Prior Function   Level of Independence Independent    Vocation Retired    Leisure walk, travel, shopping, going out to eat      Cognition   Overall Cognitive Status Within Functional Limits for tasks assessed      Observation/Other Assessments   Focus on Therapeutic Outcomes (FOTO)  37%   Predicted by visit 16: 54%     ROM / Strength   AROM / PROM / Strength AROM;Strength      AROM   AROM Assessment Site Ankle    Right/Left Ankle Right;Left    Right Ankle Dorsiflexion 4    Right  Ankle Plantar Flexion 32    Right Ankle Inversion 14    Right Ankle Eversion 9    Left Ankle Dorsiflexion -  5    Left Ankle Plantar Flexion 42    Left Ankle Inversion 16    Left Ankle Eversion 15      Strength   Overall Strength Comments R hip/knee strength of 4- to 4/5, L hip/knee strength of 4/5 throughout    Strength Assessment Site Ankle    Right/Left Ankle Right;Left    Right Ankle Dorsiflexion 3/5    Right Ankle Plantar Flexion 3/5    Right Ankle Inversion 2/5    Right Ankle Eversion 2/5    Left Ankle Dorsiflexion 3+/5    Left Ankle Plantar Flexion 3+/5    Left Ankle Inversion 2/5    Left Ankle Eversion 2/5      Transfers   Five time sit to stand comments  12.3 sec with UE use      Standardized Balance Assessment   Standardized Balance Assessment Timed Up and Go Test      Timed Up and Go Test   TUG Normal TUG    Normal TUG (seconds) 11.1   without AD                       Objective measurements completed on examination: See above findings.                PT Education - 11/24/21 1525     Education Details Pt provided with HEP    Person(s) Educated Patient    Methods Explanation;Demonstration;Handout    Comprehension Verbalized understanding;Returned demonstration              PT Short Term Goals - 11/24/21 1549       PT SHORT TERM GOAL #1   Title Pt will be independent with initial HEP.    Time 2    Period Weeks    Status New               PT Long Term Goals - 11/24/21 1550       PT LONG TERM GOAL #1   Title Pt will be independent with advanced HEP.    Time 8    Period Weeks    Status New      PT LONG TERM GOAL #2   Title Pt will report at least a 50% improvement in symptoms to allow her to go walking or shopping.    Time 8    Period Weeks    Status New      PT LONG TERM GOAL #3   Title Pt will increase B ankle strength to at least 4/5 and B hip/knee strength to at least 4+/5 to allow her to perform  functional transfers more easily.    Time 8    Period Weeks    Status New      PT LONG TERM GOAL #4   Title Pt will increase B ankle ROM to [facility] to allow her to more easily navigate steps.    Time 8    Period Weeks    Status New      PT LONG TERM GOAL #5   Title Pt will report no dizziness with various activities around her home and out in community.    Time 8    Period Weeks    Status New                    Plan - 11/24/21 1537     Clinical Impression Statement Pt is a 70 y.o. female referred to  outpatient PT by Dr Lucia Gaskins secondary to R foot and ankle pain. Pt states in April 2018, she fell through the attic and Fx B ankles and Dr Percell Miller surgically repaired both and she had therapy following. Pts PLOF was able to ambulate without device and able to travel and ambulate without increased pain. She states that over the past few months, she has been having more pain in her R foot/ankle that is worse when she is lying down at night or having foot in a dependent position. Pt additionally reports that she has a history of vertigo and she had been referred to outpatient PT in the past, but she was unable to perform due to worsening symptoms. Pt additionally reports L foot pain, but states that her L foot is more numb, so it does not bother her as much as the R foot. Pt presents with decreased B ankle ROM, decrased strength, increased pain, history of dizziness, and diffuclty walking.  She is having difficulty performing her prior functional tasks, such as walking outside and ability to travel and would benefit from skilled PT to address.    Personal Factors and Comorbidities Comorbidity 3+    Comorbidities mirgraine, chronic bronchitis, CRPS lower limb with spinal cord stimulator, essential HTN    Examination-Activity Limitations Squat;Stairs;Locomotion Level;Sleep    Examination-Participation Restrictions Community Activity    Stability/Clinical Decision Making Evolving/Moderate  complexity    Clinical Decision Making Moderate    Rehab Potential Good    PT Frequency 2x / week    PT Duration 8 weeks    PT Treatment/Interventions ADLs/Self Care Home Management;Aquatic Therapy;Canalith Repostioning;Cryotherapy;Iontophoresis 4mg /ml Dexamethasone;Moist Heat;Ultrasound;Gait training;Stair training;Functional mobility training;Therapeutic activities;Therapeutic exercise;Balance training;Neuromuscular re-education;Patient/family education;Manual techniques;Passive range of motion;Dry needling;Taping;Vasopneumatic Device;Joint Manipulations    PT Next Visit Plan progress and assess HEP, manual therapy as indicated, strengthening, ROM    PT Home Exercise Plan Access Code: [email or web address]    Consulted and Agree with Plan of Care Patient             Patient will benefit from skilled therapeutic intervention in order to improve the following deficits and impairments:  Difficulty walking, Decreased activity tolerance, Pain, Decreased balance, Decreased strength, Impaired flexibility, Decreased range of motion  Visit Diagnosis: Pain in right ankle and joints of right foot - Plan: PT plan of care cert/re-cert  Muscle weakness (generalized) - Plan: PT plan of care cert/re-cert  Difficulty in walking, not elsewhere classified - Plan: PT plan of care cert/re-cert  Dizziness and giddiness - Plan: PT plan of care cert/re-cert  Balance problem - Plan: PT plan of care cert/re-cert     Problem List Patient Active Problem List   Diagnosis Date Noted   Female stress incontinence 11/26/2020   CRPS (complex regional pain syndrome), lower limb with spinal cord simulator 11/26/2020   Essential hypertension 11/26/2020   Chronic bronchitis ([place]) 11/26/2020   Emotional disorder 11/26/2020   Menopausal symptoms 11/26/2020   Migraine 11/26/2020   Basal cell carcinoma (BCC) 11/26/2020   Other spondylosis with myelopathy, lumbar region 09/09/2020   Elevated blood-pressure reading, without  diagnosis of hypertension 08/05/2020   Spinal cord stimulator status 06/18/2020   Pain in left foot 02/13/2020   Chronic pain 11/06/2019   Hyperlipidemia, unspecified 11/06/2019   Peripheral neuropathic pain 09/27/2017   DDD (degenerative disc disease), lumbosacral    Left foot drop    GERD (gastroesophageal reflux disease) 03/26/2017   Dyslipidemia    Nicotine dependence with current use    IBS (irritable bowel syndrome)  Osteoporosis 02/24/2016   Osteoarthritis 02/24/2016   Insomnia 02/24/2016    Juel Burrow, PT, DPT 11/24/2021, 3:56 PM  [facility] @ [place], [place], [address] Phone: [phone]   Fax:  [phone]  Name: Nichole Delgado MRN: [id number] Date of Birth: 01/27/1951

## 2021-11-24 NOTE — Patient Instructions (Signed)
Access Code: [email or web address] URL: [email or web address] Date: 11/24/2021 Prepared by: Juel Burrow  Exercises Seated Ankle Pumps - 1-2 x daily - 7 x weekly - 1 sets - 10 reps Seated Ankle Circles - 1-2 x daily - 7 x weekly - 1 sets - 10 reps Seated Ankle Alphabet - 1 x daily - 7 x weekly - 1 sets - 10 reps Gastroc Stretch on Wall - 1-2 x daily - 7 x weekly - 1 sets - 3 reps - 20 sec hold

## 2021-11-26 ENCOUNTER — Ambulatory Visit: Payer: Medicare Other | Admitting: Rehabilitative and Restorative Service Providers"

## 2021-11-26 ENCOUNTER — Encounter: Payer: Self-pay | Admitting: Rehabilitative and Restorative Service Providers"

## 2021-11-26 ENCOUNTER — Other Ambulatory Visit: Payer: Self-pay

## 2021-11-26 DIAGNOSIS — R2689 Other abnormalities of gait and mobility: Secondary | ICD-10-CM | POA: Diagnosis not present

## 2021-11-26 DIAGNOSIS — M6281 Muscle weakness (generalized): Secondary | ICD-10-CM

## 2021-11-26 DIAGNOSIS — R262 Difficulty in walking, not elsewhere classified: Secondary | ICD-10-CM

## 2021-11-26 DIAGNOSIS — M25571 Pain in right ankle and joints of right foot: Secondary | ICD-10-CM

## 2021-11-26 DIAGNOSIS — R42 Dizziness and giddiness: Secondary | ICD-10-CM | POA: Diagnosis not present

## 2021-11-26 NOTE — Therapy (Signed)
[facility] @ [place], [place], [address] Phone: [phone]   Fax:  [phone]  Physical Therapy Treatment  Patient Details  Name: Nichole Delgado MRN: [id number] Date of Birth: 1951-11-14 Referring Provider (PT): Dr Melony Overly   Encounter Date: 11/26/2021   PT End of Session - 11/26/21 1238     Visit Number 2    Date for PT Re-Evaluation 01/15/22    Authorization Type Medicare/BCBS    Progress Note Due on Visit 10    PT Start Time 1234    PT Stop Time 1314    PT Time Calculation (min) 40 min    Activity Tolerance Patient tolerated treatment well    Behavior During Therapy [facility] for tasks assessed/performed             Past Medical History:  Diagnosis Date   Cervical spondylosis    Complex regional pain syndrome I    followed by pain clinic, pcp,  has spinal cord stimulator   DDD (degenerative disc disease), lumbar    GAD (generalized anxiety disorder)    GERD (gastroesophageal reflux disease)    History of basal cell carcinoma (BCC) excision    per pt moh's sx on nose in 2020   History of chronic bronchitis    uses inhaler prn   Hypertension    followed by pcp and dr hilty   Irritable bowel syndrome with diarrhea    followed by dr Watt Climes (gi)   Lesion of bladder    Migraines    [facility]-- [facility] [place]   Mixed dyslipidemia    followed by lipid clinic--- dr Debara Pickett   Neuropathy    OA (osteoarthritis)    PONV (postoperative nausea and vomiting)    severe   Pre-diabetes    S/P insertion of spinal 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   Stress incontinence    SUI (stress urinary incontinence, female)    Thyroid cyst    04-24-2021  pt stated recently dx and has been referred to endocrinologist   Vertigo    Wears glasses     Past Surgical History:  Procedure Laterality Date   BACK SURGERY     BLADDER SURGERY  05/12/2021   for stress incontinence per pt   CATARACT  EXTRACTION W/ INTRAOCULAR LENS  IMPLANT, BILATERAL  2005   COLONOSCOPY W/ POLYPECTOMY     CYSTOSCOPY WITH BIOPSY N/A 05/12/2021   Procedure: CYSTOSCOPY WITH BIOPSY AND FULGERATION;  Surgeon: Robley Fries, MD;  Location: [facility];  Service: Urology;  Laterality: N/A;  30 MINS   FOOT SURGERY Right 2011   hammer toe   ORIF ANKLE FRACTURE Left 03/27/2017   Procedure: OPEN REDUCTION INTERNAL FIXATION (ORIF) BIMALLEOLAR ANKLE FRACTURE;  Surgeon: Renette Butters, MD;  Location: [facility];  Service: Orthopedics;  Laterality: Left;   ORIF CALCANEOUS FRACTURE Right 04/05/2017   Procedure: OPEN REDUCTION INTERNAL FIXATION (ORIF) CALCANEOUS AND FIBULA FRACTURE;  Surgeon: Renette Butters, MD;  Location: [facility];  Service: Orthopedics;  Laterality: Right;   POSTERIOR LUMBAR FUSION  09-08-2020  @[facility]    L5--S1   SPINAL CORD STIMULATOR INSERTION  08/15/2018   SUPRACERVICAL ABDOMINAL HYSTERECTOMY  1988   TONSILLECTOMY AND ADENOIDECTOMY  child    There were no vitals filed for this visit.   Subjective Assessment - 11/26/21 1236     Subjective Pt reports no changes since evaluation    Patient Stated Goals I would like to be  more acitve, to be able to shop and travel without pain.    Currently in Pain? Yes    Pain Score 5     Pain Location Foot    Pain Orientation Right                               [facility] Adult PT Treatment/Exercise - 11/26/21 0001       Exercises   Exercises Ankle      Ankle Exercises: Aerobic   Nustep L3 x5 min with PT present to discuss progress      Ankle Exercises: Standing   Rocker Board 1 minute    Rocker Board Limitations fwd/back and side/side each    Other Standing Ankle Exercises foward and lateral step ups to 4" step x10 BLE      Ankle Exercises: Seated   ABC's 1 rep    Towel Crunch Limitations 2x10 B towel crunch    Heel Raises Both;20 reps    Toe Raise 20 reps    Other Seated Ankle Exercises 4 way ankle motion with  yellow tband x10B      Ankle Exercises: Supine   Other Supine Ankle Exercises Piriformis stretch and Hamstring stretch with strap BLE 20 sec x2 each.    Other Supine Ankle Exercises bridging 2x10                       PT Short Term Goals - 11/26/21 1320       PT SHORT TERM GOAL #1   Title Pt will be independent with initial HEP.    Status On-going               PT Long Term Goals - 11/24/21 1550       PT LONG TERM GOAL #1   Title Pt will be independent with advanced HEP.    Time 8    Period Weeks    Status New      PT LONG TERM GOAL #2   Title Pt will report at least a 50% improvement in symptoms to allow her to go walking or shopping.    Time 8    Period Weeks    Status New      PT LONG TERM GOAL #3   Title Pt will increase B ankle strength to at least 4/5 and B hip/knee strength to at least 4+/5 to allow her to perform functional transfers more easily.    Time 8    Period Weeks    Status New      PT LONG TERM GOAL #4   Title Pt will increase B ankle ROM to [facility] to allow her to more easily navigate steps.    Time 8    Period Weeks    Status New      PT LONG TERM GOAL #5   Title Pt will report no dizziness with various activities around her home and out in community.    Time 8    Period Weeks    Status New                   Plan - 11/26/21 1317     Clinical Impression Statement Pt noted to have tight hamstrings and added hamstring stretch during session.  Pt with difficulty performing towel crunches with toes and required tactile and visual cuing.  Pt with limited motion noted during activity.  Pt  with some toe cramping reported during 4 way ther ex strengthening.  At end of session, pt states no increased pain.    Personal Factors and Comorbidities Comorbidity 3+    Comorbidities mirgraine, chronic bronchitis, CRPS lower limb with spinal cord stimulator, essential HTN    PT Treatment/Interventions ADLs/Self Care Home  Management;Aquatic Therapy;Canalith Repostioning;Cryotherapy;Iontophoresis 4mg /ml Dexamethasone;Moist Heat;Ultrasound;Gait training;Stair training;Functional mobility training;Therapeutic activities;Therapeutic exercise;Balance training;Neuromuscular re-education;Patient/family education;Manual techniques;Passive range of motion;Dry needling;Taping;Vasopneumatic Device;Joint Manipulations    PT Next Visit Plan progress and assess HEP, manual therapy as indicated, strengthening, ROM    Consulted and Agree with Plan of Care Patient             Patient will benefit from skilled therapeutic intervention in order to improve the following deficits and impairments:  Difficulty walking, Decreased activity tolerance, Pain, Decreased balance, Decreased strength, Impaired flexibility, Decreased range of motion  Visit Diagnosis: Pain in right ankle and joints of right foot  Muscle weakness (generalized)  Difficulty in walking, not elsewhere classified     Problem List Patient Active Problem List   Diagnosis Date Noted   Female stress incontinence 11/26/2020   CRPS (complex regional pain syndrome), lower limb with spinal cord simulator 11/26/2020   Essential hypertension 11/26/2020   Chronic bronchitis ([place]) 11/26/2020   Emotional disorder 11/26/2020   Menopausal symptoms 11/26/2020   Migraine 11/26/2020   Basal cell carcinoma (BCC) 11/26/2020   Other spondylosis with myelopathy, lumbar region 09/09/2020   Elevated blood-pressure reading, without diagnosis of hypertension 08/05/2020   Spinal cord stimulator status 06/18/2020   Pain in left foot 02/13/2020   Chronic pain 11/06/2019   Hyperlipidemia, unspecified 11/06/2019   Peripheral neuropathic pain 09/27/2017   DDD (degenerative disc disease), lumbosacral    Left foot drop    GERD (gastroesophageal reflux disease) 03/26/2017   Dyslipidemia    Nicotine dependence with current use    IBS (irritable bowel syndrome)    Osteoporosis  02/24/2016   Osteoarthritis 02/24/2016   Insomnia 02/24/2016    Juel Burrow, PT, DPT 11/26/2021, 1:21 PM  [facility] @ [place], [place], [address] Phone: [phone]   Fax:  [phone]  Name: Nichole Delgado MRN: [id number] Date of Birth: Nov 25, 1951

## 2021-12-01 ENCOUNTER — Encounter: Payer: Medicare Other | Admitting: Rehabilitative and Restorative Service Providers"

## 2021-12-03 ENCOUNTER — Encounter: Payer: Self-pay | Admitting: Rehabilitative and Restorative Service Providers"

## 2021-12-03 ENCOUNTER — Ambulatory Visit: Payer: Medicare Other | Admitting: Rehabilitative and Restorative Service Providers"

## 2021-12-03 ENCOUNTER — Other Ambulatory Visit: Payer: Self-pay

## 2021-12-03 DIAGNOSIS — M25571 Pain in right ankle and joints of right foot: Secondary | ICD-10-CM | POA: Diagnosis not present

## 2021-12-03 DIAGNOSIS — R42 Dizziness and giddiness: Secondary | ICD-10-CM | POA: Diagnosis not present

## 2021-12-03 DIAGNOSIS — R262 Difficulty in walking, not elsewhere classified: Secondary | ICD-10-CM | POA: Diagnosis not present

## 2021-12-03 DIAGNOSIS — M6281 Muscle weakness (generalized): Secondary | ICD-10-CM

## 2021-12-03 DIAGNOSIS — R2689 Other abnormalities of gait and mobility: Secondary | ICD-10-CM | POA: Diagnosis not present

## 2021-12-03 NOTE — Therapy (Signed)
[facility] @ [place] [address] [place], [place], [address] Phone: [phone]   Fax:  [phone]  Physical Therapy Treatment  Patient Details  Name: Nichole Delgado MRN: [id number] Date of Birth: [birth year] Referring Provider (PT): Dr Melony Overly   Encounter Date: 12/03/2021   PT End of Session - 12/03/21 1154     Visit Number 3    Date for PT Re-Evaluation 01/15/22    Authorization Type Medicare/BCBS    Progress Note Due on Visit 10    PT Start Time 8592    PT Stop Time 1225    PT Time Calculation (min) 40 min    Activity Tolerance Patient tolerated treatment well    Behavior During Therapy [facility] for tasks assessed/performed             Past Medical History:  Diagnosis Date   Cervical spondylosis    Complex regional pain syndrome I    followed by pain clinic, pcp,  has spinal cord stimulator   DDD (degenerative disc disease), lumbar    GAD (generalized anxiety disorder)    GERD (gastroesophageal reflux disease)    History of basal cell carcinoma (BCC) excision    per pt moh's sx on nose in 2020   History of chronic bronchitis    uses inhaler prn   Hypertension    followed by pcp and dr hilty   Irritable bowel syndrome with diarrhea    followed by dr Watt Climes (gi)   Lesion of bladder    Migraines    [facility] [place]   Mixed dyslipidemia    followed by lipid clinic--- dr Debara Pickett   Neuropathy    OA (osteoarthritis)    PONV (postoperative nausea and vomiting)    severe   Pre-diabetes    S/P insertion of spinal 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   Stress incontinence    SUI (stress urinary incontinence, female)    Thyroid cyst    04-24-2021  pt stated recently dx and has been referred to endocrinologist   Vertigo    Wears glasses     Past Surgical History:  Procedure Laterality Date   BACK SURGERY     BLADDER SURGERY  05/12/2021   for stress incontinence per pt    CATARACT EXTRACTION W/ INTRAOCULAR LENS  IMPLANT, BILATERAL  2005   COLONOSCOPY W/ POLYPECTOMY     CYSTOSCOPY WITH BIOPSY N/A 05/12/2021   Procedure: CYSTOSCOPY WITH BIOPSY AND FULGERATION;  Surgeon: Robley Fries, MD;  Location: [facility];  Service: Urology;  Laterality: N/A;  30 MINS   FOOT SURGERY Right 2011   hammer toe   ORIF ANKLE FRACTURE Left 03/27/2017   Procedure: OPEN REDUCTION INTERNAL FIXATION (ORIF) BIMALLEOLAR ANKLE FRACTURE;  Surgeon: Renette Butters, MD;  Location: [facility];  Service: Orthopedics;  Laterality: Left;   ORIF CALCANEOUS FRACTURE Right 04/05/2017   Procedure: OPEN REDUCTION INTERNAL FIXATION (ORIF) CALCANEOUS AND FIBULA FRACTURE;  Surgeon: Renette Butters, MD;  Location: [facility];  Service: Orthopedics;  Laterality: Right;   POSTERIOR LUMBAR FUSION  09-08-2020  _0    L5--S1   SPINAL CORD STIMULATOR INSERTION  08/15/2018   SUPRACERVICAL ABDOMINAL HYSTERECTOMY  1988   TONSILLECTOMY AND ADENOIDECTOMY  child    There were no vitals filed for this visit.   Subjective Assessment - 12/03/21 1149     Subjective Pt reports that she has been having problems with her neurostimulator and the new program they gave  her to try.    Pertinent History mirgraine, chronic bronchitis, CRPS lower limb with spinal cord stimulator, essential HTN    Patient Stated Goals I would like to be more acitve, to be able to shop and travel without pain.    Currently in Pain? Yes    Pain Score 5     Pain Location Foot    Pain Orientation Right    Pain Descriptors / Indicators Tightness    Pain Type Chronic pain                               [facility] Adult PT Treatment/Exercise - 12/03/21 0001       Ankle Exercises: Aerobic   Nustep L3 x7 min with PT present to discuss progress      Ankle Exercises: Seated   ABC's 1 rep    Heel Raises Both;20 reps    Toe Raise 20 reps    Other Seated Ankle Exercises 4 way ankle motion with yellow tband 2x10B       Additional Ankle Exercises DO NOT USE   Towel Crunch Limitations 2x10 B towel crunch      Ankle Exercises: Supine   Other Supine Ankle Exercises Piriformis stretch and Hamstring stretch with strap BLE 20 sec x2 each.    Other Supine Ankle Exercises bridging 2x10      Ankle Exercises: Standing   Rocker Board 1 minute    Rocker Board Limitations fwd/back and side/side each                       PT Short Term Goals - 12/03/21 1249       PT SHORT TERM GOAL #1   Title Pt will be independent with initial HEP.    Status Partially Met               PT Long Term Goals - 11/24/21 1550       PT LONG TERM GOAL #1   Title Pt will be independent with advanced HEP.    Time 8    Period Weeks    Status New      PT LONG TERM GOAL #2   Title Pt will report at least a 50% improvement in symptoms to allow her to go walking or shopping.    Time 8    Period Weeks    Status New      PT LONG TERM GOAL #3   Title Pt will increase B ankle strength to at least 4/5 and B hip/knee strength to at least 4+/5 to allow her to perform functional transfers more easily.    Time 8    Period Weeks    Status New      PT LONG TERM GOAL #4   Title Pt will increase B ankle ROM to [facility] to allow her to more easily navigate steps.    Time 8    Period Weeks    Status New      PT LONG TERM GOAL #5   Title Pt will report no dizziness with various activities around her home and out in community.    Time 8    Period Weeks    Status New                   Plan - 12/03/21 1246     Clinical Impression Statement Ms Bamba continues to have B tight  hamstrings and difficulty with performing towel crunches, however, she did not require tactile cuing today. Pt continues to have muscle cramping during session, but was able to adjust her neurostimulator and had decreased complaints of muscle spasms. Pt with difficulty stepping up on rocker board and requires CGA for steadying.     Personal Factors and Comorbidities Comorbidity 3+    Comorbidities mirgraine, chronic bronchitis, CRPS lower limb with spinal cord stimulator, essential HTN    PT Treatment/Interventions ADLs/Self Care Home Management;Aquatic Therapy;Canalith Repostioning;Cryotherapy;Iontophoresis 33m/ml Dexamethasone;Moist Heat;Ultrasound;Gait training;Stair training;Functional mobility training;Therapeutic activities;Therapeutic exercise;Balance training;Neuromuscular re-education;Patient/family education;Manual techniques;Passive range of motion;Dry needling;Taping;Vasopneumatic Device;Joint Manipulations    PT Next Visit Plan progress and assess HEP, manual therapy as indicated, strengthening, ROM    Consulted and Agree with Plan of Care Patient             Patient will benefit from skilled therapeutic intervention in order to improve the following deficits and impairments:  Difficulty walking, Decreased activity tolerance, Pain, Decreased balance, Decreased strength, Impaired flexibility, Decreased range of motion  Visit Diagnosis: Pain in right ankle and joints of right foot  Muscle weakness (generalized)  Difficulty in walking, not elsewhere classified     Problem List Patient Active Problem List   Diagnosis Date Noted   Female stress incontinence 11/26/2020   CRPS (complex regional pain syndrome), lower limb with spinal cord simulator 11/26/2020   Essential hypertension 11/26/2020   Chronic bronchitis ([place] 11/26/2020   Emotional disorder 11/26/2020   Menopausal symptoms 11/26/2020   Migraine 11/26/2020   Basal cell carcinoma (BCC) 11/26/2020   Other spondylosis with myelopathy, lumbar region 09/09/2020   Elevated blood-pressure reading, without diagnosis of hypertension 08/05/2020   Spinal cord stimulator status 06/18/2020   Pain in left foot 02/13/2020   Chronic pain 11/06/2019   Hyperlipidemia, unspecified 11/06/2019   Peripheral neuropathic pain 09/27/2017   DDD (degenerative  disc disease), lumbosacral    Left foot drop    GERD (gastroesophageal reflux disease) 03/26/2017   Dyslipidemia    Nicotine dependence with current use    IBS (irritable bowel syndrome)    Osteoporosis 02/24/2016   Osteoarthritis 02/24/2016   Insomnia 02/24/2016    SJuel Burrow PT, DPT 12/03/2021, 12:50 PM  [email or web address]@ [place] [address]Phone: [phone]  Fax:  [phone] Name: Nichole BESECKERMRN: [id number] of Birth: 7December 14, 1952

## 2021-12-15 ENCOUNTER — Telehealth: Payer: Self-pay | Admitting: Family Medicine

## 2021-12-15 NOTE — Progress Notes (Signed)
°  Care Management   Follow Up Note   12/15/2021 Name: Nichole Delgado MRN: [id number] DOB: 23-Nov-1951   Referred by: Vivi Barrack, MD Reason for referral : No chief complaint on file.   An unsuccessful telephone outreach was attempted today. The patient was referred to the case management team for assistance with care management and care coordination.   Follow Up Plan: No further follow up required:    [place]

## 2021-12-16 ENCOUNTER — Encounter: Payer: Self-pay | Admitting: Rehabilitative and Restorative Service Providers"

## 2021-12-16 ENCOUNTER — Other Ambulatory Visit: Payer: Self-pay

## 2021-12-16 ENCOUNTER — Ambulatory Visit: Payer: Medicare Other | Admitting: Rehabilitative and Restorative Service Providers"

## 2021-12-16 DIAGNOSIS — R2689 Other abnormalities of gait and mobility: Secondary | ICD-10-CM | POA: Diagnosis not present

## 2021-12-16 DIAGNOSIS — R262 Difficulty in walking, not elsewhere classified: Secondary | ICD-10-CM

## 2021-12-16 DIAGNOSIS — M6281 Muscle weakness (generalized): Secondary | ICD-10-CM | POA: Diagnosis not present

## 2021-12-16 DIAGNOSIS — M25571 Pain in right ankle and joints of right foot: Secondary | ICD-10-CM

## 2021-12-16 DIAGNOSIS — R42 Dizziness and giddiness: Secondary | ICD-10-CM | POA: Diagnosis not present

## 2021-12-16 NOTE — Therapy (Signed)
Nichole Delgado, [place], [address] Phone: [phone]   Fax:  [phone]  Physical Therapy Treatment  Patient Details  Name: Nichole Delgado MRN: [id number] Date of Birth: 05-18-1951 Referring Provider (PT): Nichole Nichole Delgado   Encounter Date: 12/16/2021   PT End of Session - 12/16/21 1414     Visit Number 4    Date for PT Re-Evaluation 01/15/22    Authorization Type Medicare/BCBS    Progress Note Due on Visit 10    PT Start Time 1406    PT Stop Time 1445    PT Time Calculation (min) 39 min    Activity Tolerance Patient tolerated treatment well    Behavior During Therapy [facility] for tasks assessed/performed             Past Medical History:  Diagnosis Date   Cervical spondylosis    Complex regional pain syndrome I    followed by pain clinic, pcp,  has spinal cord stimulator   DDD (degenerative disc disease), lumbar    GAD (generalized anxiety disorder)    GERD (gastroesophageal reflux disease)    History of basal cell carcinoma (BCC) excision    per pt moh's sx on nose in 2020   History of chronic bronchitis    uses inhaler prn   Hypertension    followed by pcp and Nichole Delgado   Irritable bowel syndrome with diarrhea    followed by Nichole Delgado (gi)   Lesion of bladder    Migraines    neurology-- Nichole Delgado   Mixed dyslipidemia    followed by lipid clinic--- Nichole Delgado   Neuropathy    OA (osteoarthritis)    PONV (postoperative nausea and vomiting)    severe   Pre-diabetes    S/P insertion of spinal 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   Stress incontinence    SUI (stress urinary incontinence, female)    Thyroid cyst    04-24-2021  pt stated recently dx and has been referred to endocrinologist   Vertigo    Wears glasses     Past Surgical History:  Procedure Laterality Date   BACK SURGERY     BLADDER SURGERY  05/12/2021   for stress incontinence per pt    CATARACT EXTRACTION W/ INTRAOCULAR LENS  IMPLANT, BILATERAL  2005   COLONOSCOPY W/ POLYPECTOMY     CYSTOSCOPY WITH BIOPSY N/A 05/12/2021   Procedure: [place];  Surgeon: Nichole Fries, MD;  Location: [facility];  Service: Urology;  Laterality: N/A;  30 MINS   FOOT SURGERY Right 2011   hammer toe   ORIF ANKLE FRACTURE Left 03/27/2017   Procedure: OPEN REDUCTION INTERNAL FIXATION (ORIF) BIMALLEOLAR ANKLE FRACTURE;  Surgeon: Nichole Butters, MD;  Location: [facility];  Service: Orthopedics;  Laterality: Left;   ORIF CALCANEOUS FRACTURE Right 04/05/2017   Procedure: OPEN REDUCTION INTERNAL FIXATION (ORIF) CALCANEOUS AND FIBULA FRACTURE;  Surgeon: Nichole Butters, MD;  Location: [facility];  Service: Orthopedics;  Laterality: Right;   POSTERIOR LUMBAR FUSION  09-08-2020  _0    L5--S1   SPINAL CORD STIMULATOR INSERTION  08/15/2018   SUPRACERVICAL ABDOMINAL HYSTERECTOMY  1988   TONSILLECTOMY AND ADENOIDECTOMY  child    There were no vitals filed for this visit.   Subjective Assessment - 12/16/21 1412     Subjective "I think I overdid it with keeping my grand neice last week." Pt reports that her entire  R leg is hurting.    Patient Stated Goals I would like to be more acitve, to be able to shop and travel without pain.    Currently in Pain? Yes    Pain Score 8     Pain Location Foot    Pain Orientation Right    Pain Descriptors / Indicators Sharp    Pain Type Chronic pain                               [facility] Adult PT Treatment/Exercise - 12/16/21 0001       Ambulation/Gait   Gait Comments fwd step up x10 reps B onto 2" step without UE support.      Manual Therapy   Manual Therapy Soft tissue mobilization;Passive ROM    Manual therapy comments in sitting    Soft tissue mobilization to R ankle, foot, and calf secondary to muscle tightness and incision/scar hypersensitvity    Passive ROM to R ankle following STM       Ankle Exercises: Aerobic   Nustep L4 x6 min with PT present to discuss progress   238 steps     Ankle Exercises: Seated   Heel Raises Both;20 reps    Toe Raise 20 reps    Other Seated Ankle Exercises 4 way ankle motion with yellow tband 1x10B      Ankle Exercises: Standing   Rocker Board 1 minute    Rocker Board Limitations fwd/back and side/side each    Heel Raises Both;20 reps    Toe Raise 20 reps      Ankle Exercises: Stretches   Soleus Stretch 1 rep;20 seconds    Gastroc Stretch 2 reps;20 seconds                       PT Short Term Goals - 12/16/21 1507       PT SHORT TERM GOAL #1   Title Pt will be independent with initial HEP.    Status Partially Met               PT Long Term Goals - 12/16/21 1507       PT LONG TERM GOAL #1   Title Pt will be independent with advanced HEP.    Status On-going      PT LONG TERM GOAL #2   Title Pt will report at least a 50% improvement in symptoms to allow her to go walking or shopping.    Status On-going      PT LONG TERM GOAL #3   Title Pt will increase B ankle strength to at least 4/5 and B hip/knee strength to at least 4+/5 to allow her to perform functional transfers more easily.    Status On-going      PT LONG TERM GOAL #4   Title Pt will increase B ankle ROM to [facility] to allow her to more easily navigate steps.    Status On-going      PT LONG TERM GOAL #5   Title Pt will report no dizziness with various activities around her home and out in community.    Status On-going                   Plan - 12/16/21 1502     Clinical Impression Statement [name] with increased overall RLE pain today and states that she took a pain pill prior to coming, but  that she almost cancelled her appointment today.  She admits to not doing any of her HEP or stretches last week due to her caring for her grand-neice.  Pt educated on the importance of performing HEP and stretching routinely to decrease her muscle  tightness and pain, she verbalized her understanding.  Pt with hypersensitivity noted along R lateral ankle scar, proceeded with gentle palpation to assist with desensitization.  Educated pt on this and the importance and she stated that she could have her husband assist. Pt reports 4/10 pain at completion of session today.    Comorbidities mirgraine, chronic bronchitis, CRPS lower limb with spinal cord stimulator, essential HTN    PT Treatment/Interventions ADLs/Self Care Home Management;Aquatic Therapy;Canalith Repostioning;Cryotherapy;Iontophoresis 1m/ml Dexamethasone;Moist Heat;Ultrasound;Gait training;Stair training;Functional mobility training;Therapeutic activities;Therapeutic exercise;Balance training;Neuromuscular re-education;Patient/family education;Manual techniques;Passive range of motion;Dry needling;Taping;Vasopneumatic Device;Joint Manipulations    PT Next Visit Plan progress and assess HEP, manual therapy as indicated, strengthening, ROM    Consulted and Agree with Plan of Care Patient             Patient will benefit from skilled therapeutic intervention in order to improve the following deficits and impairments:  Difficulty walking, Decreased activity tolerance, Pain, Decreased balance, Decreased strength, Impaired flexibility, Decreased range of motion  Visit Diagnosis: Pain in right ankle and joints of right foot  Muscle weakness (generalized)  Difficulty in walking, not elsewhere classified     Problem List Patient Active Problem List   Diagnosis Date Noted   Female stress incontinence 11/26/2020   CRPS (complex regional pain syndrome), lower limb with spinal cord simulator 11/26/2020   Essential hypertension 11/26/2020   Chronic bronchitis ([place] 11/26/2020   Emotional disorder 11/26/2020   Menopausal symptoms 11/26/2020   Migraine 11/26/2020   Basal cell carcinoma (BCC) 11/26/2020   Other spondylosis with myelopathy, lumbar region 09/09/2020   Elevated  blood-pressure reading, without diagnosis of hypertension 08/05/2020   Spinal cord stimulator status 06/18/2020   Pain in left foot 02/13/2020   Chronic pain 11/06/2019   Hyperlipidemia, unspecified 11/06/2019   Peripheral neuropathic pain 09/27/2017   DDD (degenerative disc disease), lumbosacral    Left foot drop    GERD (gastroesophageal reflux disease) 03/26/2017   Dyslipidemia    Nicotine dependence with current use    IBS (irritable bowel syndrome)    Osteoporosis 02/24/2016   Osteoarthritis 02/24/2016   Insomnia 02/24/2016    SJuel Burrow PT, DPT 12/16/2021, 3:08 PM  CP[place]@ [place] [address]Phone: [phone]  Fax:  [phone] Name: Nichole PEGUESMRN: [id number] of Birth: 703/19/1952

## 2021-12-22 ENCOUNTER — Ambulatory Visit: Payer: Medicare Other | Attending: Orthopaedic Surgery

## 2021-12-22 ENCOUNTER — Other Ambulatory Visit: Payer: Self-pay

## 2021-12-22 ENCOUNTER — Other Ambulatory Visit: Payer: Self-pay | Admitting: Family Medicine

## 2021-12-22 DIAGNOSIS — R2689 Other abnormalities of gait and mobility: Secondary | ICD-10-CM | POA: Diagnosis not present

## 2021-12-22 DIAGNOSIS — R42 Dizziness and giddiness: Secondary | ICD-10-CM | POA: Diagnosis not present

## 2021-12-22 DIAGNOSIS — R262 Difficulty in walking, not elsewhere classified: Secondary | ICD-10-CM | POA: Diagnosis not present

## 2021-12-22 DIAGNOSIS — M6281 Muscle weakness (generalized): Secondary | ICD-10-CM | POA: Diagnosis not present

## 2021-12-22 DIAGNOSIS — Z1231 Encounter for screening mammogram for malignant neoplasm of breast: Secondary | ICD-10-CM

## 2021-12-22 DIAGNOSIS — M25571 Pain in right ankle and joints of right foot: Secondary | ICD-10-CM | POA: Insufficient documentation

## 2021-12-22 NOTE — Therapy (Signed)
[facility] @ [place], [place], [address] Phone: [phone]   Fax:  [phone]  Physical Therapy Treatment  Patient Details  Name: Nichole Delgado MRN: [id number] Date of Birth: 1951/09/10 Referring Provider (PT): Dr Melony Overly   Encounter Date: 12/22/2021   PT End of Session - 12/22/21 1547     Visit Number 5    Date for PT Re-Evaluation 01/15/22    Authorization Type Medicare/BCBS    Progress Note Due on Visit 10    PT Start Time 0354    PT Stop Time 1611    PT Time Calculation (min) 41 min    Activity Tolerance Patient tolerated treatment well    Behavior During Therapy [facility] for tasks assessed/performed             Past Medical History:  Diagnosis Date   Cervical spondylosis    Complex regional pain syndrome I    followed by pain clinic, pcp,  has spinal cord stimulator   DDD (degenerative disc disease), lumbar    GAD (generalized anxiety disorder)    GERD (gastroesophageal reflux disease)    History of basal cell carcinoma (BCC) excision    per pt moh's sx on nose in 2020   History of chronic bronchitis    uses inhaler prn   Hypertension    followed by pcp and dr hilty   Irritable bowel syndrome with diarrhea    followed by dr Watt Climes (gi)   Lesion of bladder    Migraines    [facility]-- [facility] [place]   Mixed dyslipidemia    followed by lipid clinic--- dr Debara Pickett   Neuropathy    OA (osteoarthritis)    PONV (postoperative nausea and vomiting)    severe   Pre-diabetes    S/P insertion of spinal 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   Stress incontinence    SUI (stress urinary incontinence, female)    Thyroid cyst    04-24-2021  pt stated recently dx and has been referred to endocrinologist   Vertigo    Wears glasses     Past Surgical History:  Procedure Laterality Date   BACK SURGERY     BLADDER SURGERY  05/12/2021   for stress incontinence per pt   CATARACT  EXTRACTION W/ INTRAOCULAR LENS  IMPLANT, BILATERAL  2005   COLONOSCOPY W/ POLYPECTOMY     CYSTOSCOPY WITH BIOPSY N/A 05/12/2021   Procedure: [place];  Surgeon: Robley Fries, MD;  Location: [facility];  Service: Urology;  Laterality: N/A;  30 MINS   FOOT SURGERY Right 2011   hammer toe   ORIF ANKLE FRACTURE Left 03/27/2017   Procedure: OPEN REDUCTION INTERNAL FIXATION (ORIF) BIMALLEOLAR ANKLE FRACTURE;  Surgeon: Renette Butters, MD;  Location: [facility];  Service: Orthopedics;  Laterality: Left;   ORIF CALCANEOUS FRACTURE Right 04/05/2017   Procedure: OPEN REDUCTION INTERNAL FIXATION (ORIF) CALCANEOUS AND FIBULA FRACTURE;  Surgeon: Renette Butters, MD;  Location: [facility];  Service: Orthopedics;  Laterality: Right;   POSTERIOR LUMBAR FUSION  09-08-2020  @[facility]    L5--S1   SPINAL CORD STIMULATOR INSERTION  08/15/2018   SUPRACERVICAL ABDOMINAL HYSTERECTOMY  1988   TONSILLECTOMY AND ADENOIDECTOMY  child    There were no vitals filed for this visit.   Subjective Assessment - 12/22/21 1537     Subjective Patient states she has some right ankle pain today.  She has been cleaning her house and  mopping floors.  She rates her pain 7/10 with 1/2 pain pill.  (Patient takes Tramadol)    Pertinent History mirgraine, chronic bronchitis, CRPS lower limb with spinal cord stimulator, essential HTN    How long can you sit comfortably? 30 min if feet supported with slippers on.  If dependent position sitting, can only tolerate 5 min.    How long can you walk comfortably? 5-10 min    Patient Stated Goals I would like to be more acitve, to be able to shop and travel without pain.    Currently in Pain? Yes    Pain Score 7     Pain Location Ankle    Pain Orientation Right    Pain Descriptors / Indicators Aching    Pain Type Chronic pain    Pain Onset More than a month ago    Pain Frequency Intermittent                               [facility]  Adult PT Treatment/Exercise - 12/22/21 0001       Manual Therapy   Manual Therapy Soft tissue mobilization;Passive ROM    Manual therapy comments in sitting    Soft tissue mobilization to R ankle, foot, and calf secondary to muscle tightness and incision/scar hypersensitvity    Passive ROM to R ankle following STM      Ankle Exercises: Aerobic   Nustep L4 x6 min with PT present to discuss progress   238 steps     Ankle Exercises: Stretches   Soleus Stretch 1 rep;20 seconds    Gastroc Stretch 2 reps;20 seconds      Ankle Exercises: Seated   ABC's 1 rep    Heel Raises Both;20 reps    Toe Raise 20 reps                       PT Short Term Goals - 12/16/21 1507       PT SHORT TERM GOAL #1   Title Pt will be independent with initial HEP.    Status Partially Met               PT Long Term Goals - 12/16/21 1507       PT LONG TERM GOAL #1   Title Pt will be independent with advanced HEP.    Status On-going      PT LONG TERM GOAL #2   Title Pt will report at least a 50% improvement in symptoms to allow her to go walking or shopping.    Status On-going      PT LONG TERM GOAL #3   Title Pt will increase B ankle strength to at least 4/5 and B hip/knee strength to at least 4+/5 to allow her to perform functional transfers more easily.    Status On-going      PT LONG TERM GOAL #4   Title Pt will increase B ankle ROM to [facility] to allow her to more easily navigate steps.    Status On-going      PT LONG TERM GOAL #5   Title Pt will report no dizziness with various activities around her home and out in community.    Status On-going                   Plan - 12/22/21 1547     Clinical Impression Statement Patient is quite hypersensitive to touch  making PROM difficult.  She reported pain at 4/10 post treatment.  She would benefit from doing her desensitization exercises but she has not yet tried this.             Patient will benefit from skilled  therapeutic intervention in order to improve the following deficits and impairments:     Visit Diagnosis: Pain in right ankle and joints of right foot  Muscle weakness (generalized)  Difficulty in walking, not elsewhere classified     Problem List Patient Active Problem List   Diagnosis Date Noted   Female stress incontinence 11/26/2020   CRPS (complex regional pain syndrome), lower limb with spinal cord simulator 11/26/2020   Essential hypertension 11/26/2020   Chronic bronchitis ([place]) 11/26/2020   Emotional disorder 11/26/2020   Menopausal symptoms 11/26/2020   Migraine 11/26/2020   Basal cell carcinoma (BCC) 11/26/2020   Other spondylosis with myelopathy, lumbar region 09/09/2020   Elevated blood-pressure reading, without diagnosis of hypertension 08/05/2020   Spinal cord stimulator status 06/18/2020   Pain in left foot 02/13/2020   Chronic pain 11/06/2019   Hyperlipidemia, unspecified 11/06/2019   Peripheral neuropathic pain 09/27/2017   DDD (degenerative disc disease), lumbosacral    Left foot drop    GERD (gastroesophageal reflux disease) 03/26/2017   Dyslipidemia    Nicotine dependence with current use    IBS (irritable bowel syndrome)    Osteoporosis 02/24/2016   Osteoarthritis 02/24/2016   Insomnia 02/24/2016    Anderson Malta B. Paislee Szatkowski, PT 01/03/234:17 PM   [facility] @ [place], [place], [address] Phone: [phone]   Fax:  [phone]  Name: Nichole Delgado MRN: [id number] Date of Birth: Jul 04, 1951

## 2021-12-22 NOTE — Patient Instructions (Signed)
Discussed desensitization techniques including texture training.

## 2021-12-24 ENCOUNTER — Telehealth: Payer: Self-pay | Admitting: Rehabilitative and Restorative Service Providers"

## 2021-12-24 ENCOUNTER — Encounter: Payer: Medicare Other | Admitting: Rehabilitative and Restorative Service Providers"

## 2021-12-24 DIAGNOSIS — M4327 Fusion of spine, lumbosacral region: Secondary | ICD-10-CM | POA: Diagnosis not present

## 2021-12-24 DIAGNOSIS — Z6826 Body mass index (BMI) 26.0-26.9, adult: Secondary | ICD-10-CM | POA: Diagnosis not present

## 2021-12-24 DIAGNOSIS — M545 Low back pain, unspecified: Secondary | ICD-10-CM | POA: Diagnosis not present

## 2021-12-24 DIAGNOSIS — I10 Essential (primary) hypertension: Secondary | ICD-10-CM | POA: Diagnosis not present

## 2021-12-24 NOTE — Telephone Encounter (Signed)
Called pt secondary to her not showing for her 11 am appointment on 12/24/21.  Left message to check on patient and remind her of appointment on 12/28/21 at 12:30 pm.

## 2021-12-28 ENCOUNTER — Other Ambulatory Visit: Payer: Self-pay

## 2021-12-28 ENCOUNTER — Ambulatory Visit: Payer: Medicare Other | Admitting: Rehabilitative and Restorative Service Providers"

## 2021-12-28 ENCOUNTER — Encounter: Payer: Self-pay | Admitting: Rehabilitative and Restorative Service Providers"

## 2021-12-28 DIAGNOSIS — R42 Dizziness and giddiness: Secondary | ICD-10-CM

## 2021-12-28 DIAGNOSIS — M25571 Pain in right ankle and joints of right foot: Secondary | ICD-10-CM | POA: Diagnosis not present

## 2021-12-28 DIAGNOSIS — R262 Difficulty in walking, not elsewhere classified: Secondary | ICD-10-CM | POA: Diagnosis not present

## 2021-12-28 DIAGNOSIS — R2689 Other abnormalities of gait and mobility: Secondary | ICD-10-CM

## 2021-12-28 DIAGNOSIS — M6281 Muscle weakness (generalized): Secondary | ICD-10-CM | POA: Diagnosis not present

## 2021-12-28 NOTE — Therapy (Signed)
[facility] @ [place] [name], [place], [address] Phone: [phone]   Fax:  [phone]  Physical Therapy Treatment  Patient Details  Name: Nichole Delgado MRN: [id number] Date of Birth: 06/26/1951 Referring Provider (PT): Dr Melony Overly   Encounter Date: 12/28/2021   PT End of Session - 12/28/21 1250     Visit Number 6    Date for PT Re-Evaluation 01/15/22    Authorization Type Medicare/BCBS    Progress Note Due on Visit 10    PT Start Time 1239    PT Stop Time 8341    PT Time Calculation (min) 38 min    Activity Tolerance Patient tolerated treatment well    Behavior During Therapy [facility] for tasks assessed/performed             Past Medical History:  Diagnosis Date   Cervical spondylosis    Complex regional pain syndrome I    followed by pain clinic, pcp,  has spinal cord stimulator   DDD (degenerative disc disease), lumbar    GAD (generalized anxiety disorder)    GERD (gastroesophageal reflux disease)    History of basal cell carcinoma (BCC) excision    per pt moh's sx on nose in 2020   History of chronic bronchitis    uses inhaler prn   Hypertension    followed by pcp and dr hilty   Irritable bowel syndrome with diarrhea    followed by dr Watt Climes (gi)   Lesion of bladder    Migraines    neurology-- [facility] [place]   Mixed dyslipidemia    followed by lipid clinic--- dr Debara Pickett   Neuropathy    OA (osteoarthritis)    PONV (postoperative nausea and vomiting)    severe   Pre-diabetes    S/P insertion of spinal 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   Stress incontinence    SUI (stress urinary incontinence, female)    Thyroid cyst    04-24-2021  pt stated recently dx and has been referred to endocrinologist   Vertigo    Wears glasses     Past Surgical History:  Procedure Laterality Date   BACK SURGERY     BLADDER SURGERY  05/12/2021   for stress incontinence per pt   CATARACT  EXTRACTION W/ INTRAOCULAR LENS  IMPLANT, BILATERAL  2005   COLONOSCOPY W/ POLYPECTOMY     CYSTOSCOPY WITH BIOPSY N/A 05/12/2021   Procedure: [place];  Surgeon: Robley Fries, MD;  Location: [facility];  Service: Urology;  Laterality: N/A;  30 MINS   FOOT SURGERY Right 2011   hammer toe   ORIF ANKLE FRACTURE Left 03/27/2017   Procedure: OPEN REDUCTION INTERNAL FIXATION (ORIF) BIMALLEOLAR ANKLE FRACTURE;  Surgeon: Renette Butters, MD;  Location: [facility];  Service: Orthopedics;  Laterality: Left;   ORIF CALCANEOUS FRACTURE Right 04/05/2017   Procedure: OPEN REDUCTION INTERNAL FIXATION (ORIF) CALCANEOUS AND FIBULA FRACTURE;  Surgeon: Renette Butters, MD;  Location: [facility];  Service: Orthopedics;  Laterality: Right;   POSTERIOR LUMBAR FUSION  09-08-2020  @[facility]    L5--S1   SPINAL CORD STIMULATOR INSERTION  08/15/2018   SUPRACERVICAL ABDOMINAL HYSTERECTOMY  1988   TONSILLECTOMY AND ADENOIDECTOMY  child    There were no vitals filed for this visit.   Subjective Assessment - 12/28/21 1249     Subjective My back has been bothering me.    Patient Stated Goals I would like to be  more acitve, to be able to shop and travel without pain.    Currently in Pain? Yes    Pain Score 4     Pain Location Ankle    Pain Orientation Right    Pain Descriptors / Indicators Aching                               [facility] Adult PT Treatment/Exercise - 12/28/21 0001       Manual Therapy   Manual Therapy Soft tissue mobilization;Passive ROM    Manual therapy comments in sitting    Soft tissue mobilization to R ankle, foot, and calf secondary to muscle tightness and incision/scar hypersensitvity    Passive ROM to R ankle following STM      Ankle Exercises: Aerobic   Nustep L3 x7 min with PT present to discuss progress      Ankle Exercises: Seated   ABC's 1 rep    Heel Raises Both;20 reps    Toe Raise 20 reps    Other Seated Ankle Exercises  4 way ankle motion with yellow tband 1x10B      Ankle Exercises: Stretches   Gastroc Stretch 2 reps;20 seconds                       PT Short Term Goals - 12/28/21 1332       PT SHORT TERM GOAL #1   Title Pt will be independent with initial HEP.    Status Achieved               PT Long Term Goals - 12/28/21 1332       PT LONG TERM GOAL #1   Title Pt will be independent with advanced HEP.    Status On-going      PT LONG TERM GOAL #2   Title Pt will report at least a 50% improvement in symptoms to allow her to go walking or shopping.    Status On-going                   Plan - 12/28/21 1330     Clinical Impression Statement Nichole Delgado reports biggest complaint today is her back pain, but states that her ankle is feeling better.  While she continues to still have some hypersensetivity in her R lateral ankle, she is able to tolerate increased pressure during desensitization.  Pt continues to have trigger points in R calf and tightness, and responds well to manual therapy reporting overall decreased pain. Pt continues to require skilled PT to progress towards goal related activities.    Comorbidities mirgraine, chronic bronchitis, CRPS lower limb with spinal cord stimulator, essential HTN    PT Treatment/Interventions ADLs/Self Care Home Management;Aquatic Therapy;Canalith Repostioning;Cryotherapy;Iontophoresis 4mg /ml Dexamethasone;Moist Heat;Ultrasound;Gait training;Stair training;Functional mobility training;Therapeutic activities;Therapeutic exercise;Balance training;Neuromuscular re-education;Patient/family education;Manual techniques;Passive range of motion;Dry needling;Taping;Vasopneumatic Device;Joint Manipulations    PT Next Visit Plan progress and assess HEP, manual therapy as indicated, strengthening, ROM    Consulted and Agree with Plan of Care Patient             Patient will benefit from skilled therapeutic intervention in order to improve  the following deficits and impairments:  Difficulty walking, Decreased activity tolerance, Pain, Decreased balance, Decreased strength, Impaired flexibility, Decreased range of motion  Visit Diagnosis: Pain in right ankle and joints of right foot  Muscle weakness (generalized)  Difficulty in walking, not elsewhere classified  Dizziness  and giddiness  Balance problem     Problem List Patient Active Problem List   Diagnosis Date Noted   Female stress incontinence 11/26/2020   CRPS (complex regional pain syndrome), lower limb with spinal cord simulator 11/26/2020   Essential hypertension 11/26/2020   Chronic bronchitis ([place]) 11/26/2020   Emotional disorder 11/26/2020   Menopausal symptoms 11/26/2020   Migraine 11/26/2020   Basal cell carcinoma (BCC) 11/26/2020   Other spondylosis with myelopathy, lumbar region 09/09/2020   Elevated blood-pressure reading, without diagnosis of hypertension 08/05/2020   Spinal cord stimulator status 06/18/2020   Pain in left foot 02/13/2020   Chronic pain 11/06/2019   Hyperlipidemia, unspecified 11/06/2019   Peripheral neuropathic pain 09/27/2017   DDD (degenerative disc disease), lumbosacral    Left foot drop    GERD (gastroesophageal reflux disease) 03/26/2017   Dyslipidemia    Nicotine dependence with current use    IBS (irritable bowel syndrome)    Osteoporosis 02/24/2016   Osteoarthritis 02/24/2016   Insomnia 02/24/2016    Juel Burrow, PT, DPT 12/28/2021, 1:33 PM  [facility] @ [place], [place], [address] Phone: [phone]   Fax:  [phone]  Name: Nichole Delgado MRN: [id number] Date of Birth: [birth year]

## 2021-12-30 ENCOUNTER — Ambulatory Visit: Payer: Medicare Other | Admitting: Rehabilitative and Restorative Service Providers"

## 2021-12-30 ENCOUNTER — Other Ambulatory Visit: Payer: Self-pay

## 2021-12-30 ENCOUNTER — Encounter: Payer: Self-pay | Admitting: Rehabilitative and Restorative Service Providers"

## 2021-12-30 DIAGNOSIS — M25571 Pain in right ankle and joints of right foot: Secondary | ICD-10-CM

## 2021-12-30 DIAGNOSIS — R262 Difficulty in walking, not elsewhere classified: Secondary | ICD-10-CM

## 2021-12-30 DIAGNOSIS — M6281 Muscle weakness (generalized): Secondary | ICD-10-CM | POA: Diagnosis not present

## 2021-12-30 DIAGNOSIS — R2689 Other abnormalities of gait and mobility: Secondary | ICD-10-CM | POA: Diagnosis not present

## 2021-12-30 DIAGNOSIS — R42 Dizziness and giddiness: Secondary | ICD-10-CM | POA: Diagnosis not present

## 2021-12-30 NOTE — Therapy (Signed)
[facility] @ [address] [place], [place], [address] Phone: [phone]   Fax:  [phone]  Physical Therapy Treatment  Patient Details  Name: Nichole Delgado MRN: [id number] Date of Birth: 1951/05/18 Referring Provider (PT): Dr Melony Overly   Encounter Date: 12/30/2021   PT End of Session - 12/30/21 1238     Visit Number 7    Date for PT Re-Evaluation 01/15/22    Authorization Type Medicare/BCBS    Progress Note Due on Visit 10    PT Start Time 6761    PT Stop Time 1308    PT Time Calculation (min) 38 min    Activity Tolerance Patient tolerated treatment well    Behavior During Therapy [facility] for tasks assessed/performed             Past Medical History:  Diagnosis Date   Cervical spondylosis    Complex regional pain syndrome I    followed by pain clinic, pcp,  has spinal cord stimulator   DDD (degenerative disc disease), lumbar    GAD (generalized anxiety disorder)    GERD (gastroesophageal reflux disease)    History of basal cell carcinoma (BCC) excision    per pt moh's sx on nose in 2020   History of chronic bronchitis    uses inhaler prn   Hypertension    followed by pcp and dr hilty   Irritable bowel syndrome with diarrhea    followed by dr Watt Climes (gi)   Lesion of bladder    Migraines    [facility]-- [facility] [place]   Mixed dyslipidemia    followed by lipid clinic--- dr Debara Pickett   Neuropathy    OA (osteoarthritis)    PONV (postoperative nausea and vomiting)    severe   Pre-diabetes    S/P insertion of spinal 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   Stress incontinence    SUI (stress urinary incontinence, female)    Thyroid cyst    04-24-2021  pt stated recently dx and has been referred to endocrinologist   Vertigo    Wears glasses     Past Surgical History:  Procedure Laterality Date   BACK SURGERY     BLADDER SURGERY  05/12/2021   for stress incontinence per pt   CATARACT  EXTRACTION W/ INTRAOCULAR LENS  IMPLANT, BILATERAL  2005   COLONOSCOPY W/ POLYPECTOMY     CYSTOSCOPY WITH BIOPSY N/A 05/12/2021   Procedure: [place];  Surgeon: Robley Fries, MD;  Location: [facility];  Service: Urology;  Laterality: N/A;  30 MINS   FOOT SURGERY Right 2011   hammer toe   ORIF ANKLE FRACTURE Left 03/27/2017   Procedure: OPEN REDUCTION INTERNAL FIXATION (ORIF) BIMALLEOLAR ANKLE FRACTURE;  Surgeon: Renette Butters, MD;  Location: [facility];  Service: Orthopedics;  Laterality: Left;   ORIF CALCANEOUS FRACTURE Right 04/05/2017   Procedure: OPEN REDUCTION INTERNAL FIXATION (ORIF) CALCANEOUS AND FIBULA FRACTURE;  Surgeon: Renette Butters, MD;  Location: [facility];  Service: Orthopedics;  Laterality: Right;   POSTERIOR LUMBAR FUSION  09-08-2020  _0    L5--S1   SPINAL CORD STIMULATOR INSERTION  08/15/2018   SUPRACERVICAL ABDOMINAL HYSTERECTOMY  1988   TONSILLECTOMY AND ADENOIDECTOMY  child    There were no vitals filed for this visit.   Subjective Assessment - 12/30/21 1237     Subjective Pt reports taking pain medication prior to arrival.    Pertinent History mirgraine, chronic bronchitis, CRPS  lower limb with spinal cord stimulator, essential HTN    Patient Stated Goals I would like to be more acitve, to be able to shop and travel without pain.    Currently in Pain? Yes    Pain Score 4     Pain Location Ankle    Pain Orientation Right    Pain Descriptors / Indicators Sharp    Pain Type Chronic pain    Pain Radiating Towards up towards calf                               [facility] Adult PT Treatment/Exercise - 12/30/21 0001       Ambulation/Gait   Gait Comments fwd step up x10 reps B onto 2" step without UE support.      Manual Therapy   Manual Therapy Soft tissue mobilization;Passive ROM    Manual therapy comments in sitting    Soft tissue mobilization to R ankle, foot, and calf secondary to muscle  tightness and incision/scar hypersensitvity    Passive ROM to R ankle following STM      Ankle Exercises: Aerobic   Nustep L5 x6 min with PT present to discuss progress.      Ankle Exercises: Seated   ABC's 1 rep    Heel Raises Both;20 reps    Toe Raise 20 reps    Other Seated Ankle Exercises 4 way ankle motion with yellow tband 1x12B      Ankle Exercises: Stretches   Gastroc Stretch 2 reps;20 seconds   on 2 inch step     Ankle Exercises: Standing   Rocker Board 2 minutes    Heel Raises Both;20 reps    Toe Raise 20 reps    Other Standing Ankle Exercises High Marching 2x10 bilat                       PT Short Term Goals - 12/28/21 1332       PT SHORT TERM GOAL #1   Title Pt will be independent with initial HEP.    Status Achieved               PT Long Term Goals - 12/30/21 1328       PT LONG TERM GOAL #1   Title Pt will be independent with advanced HEP.    Status On-going      PT LONG TERM GOAL #2   Title Pt will report at least a 50% improvement in symptoms to allow her to go walking or shopping.    Status On-going   pt reports that she is going to go to [employer] today.     PT LONG TERM GOAL #3   Title Pt will increase B ankle strength to at least 4/5 and B hip/knee strength to at least 4+/5 to allow her to perform functional transfers more easily.    Status On-going      PT LONG TERM GOAL #4   Title Pt will increase B ankle ROM to [facility] to allow her to more easily navigate steps.    Status On-going      PT LONG TERM GOAL #5   Title Pt will report no dizziness with various activities around her home and out in community.    Status Partially Met                   Plan - 12/30/21 1326  Clinical Impression Statement Ms Behan continues to progress with goal related activities.  Following treatment, she reported that her pain decreased to a 2/10 in her back and down to her RLE and R ankle.  Pt tolerates increased pressure with manual  therapy to R ankle and calf and is having less hypersensitivity. Pt is able to have more upright posture with standing calf stretch and require less cuing for posture.    PT Treatment/Interventions ADLs/Self Care Home Management;Aquatic Therapy;Canalith Repostioning;Cryotherapy;Iontophoresis 29m/ml Dexamethasone;Moist Heat;Ultrasound;Gait training;Stair training;Functional mobility training;Therapeutic activities;Therapeutic exercise;Balance training;Neuromuscular re-education;Patient/family education;Manual techniques;Passive range of motion;Dry needling;Taping;Vasopneumatic Device;Joint Manipulations    PT Next Visit Plan progress and assess HEP, manual therapy as indicated, strengthening, ROM    Consulted and Agree with Plan of Care Patient             Patient will benefit from skilled therapeutic intervention in order to improve the following deficits and impairments:  Difficulty walking, Decreased activity tolerance, Pain, Decreased balance, Decreased strength, Impaired flexibility, Decreased range of motion  Visit Diagnosis: Pain in right ankle and joints of right foot  Muscle weakness (generalized)  Difficulty in walking, not elsewhere classified     Problem List Patient Active Problem List   Diagnosis Date Noted   Female stress incontinence 11/26/2020   CRPS (complex regional pain syndrome), lower limb with spinal cord simulator 11/26/2020   Essential hypertension 11/26/2020   [place] 11/26/2020   Emotional disorder 11/26/2020   Menopausal symptoms 11/26/2020   Migraine 11/26/2020   Basal cell carcinoma (BCC) 11/26/2020   Other spondylosis with myelopathy, lumbar region 09/09/2020   Elevated blood-pressure reading, without diagnosis of hypertension 08/05/2020   Spinal cord stimulator status 06/18/2020   Pain in left foot 02/13/2020   Chronic pain 11/06/2019   Hyperlipidemia, unspecified 11/06/2019   Peripheral neuropathic pain 09/27/2017   DDD  (degenerative disc disease), lumbosacral    Left foot drop    GERD (gastroesophageal reflux disease) 03/26/2017   Dyslipidemia    Nicotine dependence with current use    IBS (irritable bowel syndrome)    Osteoporosis 02/24/2016   Osteoarthritis 02/24/2016   Insomnia 02/24/2016    SJuel Burrow PT, DPT 12/30/2021, 1:29 PM  [place] [place] [address]Phone: [phone]  Fax:  [phone] Name: ATOMA ERICHSENMRN: [id number] of Birth: [birth year]

## 2022-01-04 ENCOUNTER — Other Ambulatory Visit: Payer: Self-pay

## 2022-01-04 ENCOUNTER — Ambulatory Visit: Payer: Medicare Other | Attending: Internal Medicine

## 2022-01-04 ENCOUNTER — Encounter: Payer: Self-pay | Admitting: Rehabilitative and Restorative Service Providers"

## 2022-01-04 ENCOUNTER — Other Ambulatory Visit (HOSPITAL_BASED_OUTPATIENT_CLINIC_OR_DEPARTMENT_OTHER): Payer: Self-pay

## 2022-01-04 ENCOUNTER — Ambulatory Visit: Payer: Medicare Other | Admitting: Rehabilitative and Restorative Service Providers"

## 2022-01-04 DIAGNOSIS — Z23 Encounter for immunization: Secondary | ICD-10-CM

## 2022-01-04 DIAGNOSIS — R42 Dizziness and giddiness: Secondary | ICD-10-CM | POA: Diagnosis not present

## 2022-01-04 DIAGNOSIS — R2689 Other abnormalities of gait and mobility: Secondary | ICD-10-CM | POA: Diagnosis not present

## 2022-01-04 DIAGNOSIS — M25571 Pain in right ankle and joints of right foot: Secondary | ICD-10-CM

## 2022-01-04 DIAGNOSIS — M6281 Muscle weakness (generalized): Secondary | ICD-10-CM

## 2022-01-04 DIAGNOSIS — R262 Difficulty in walking, not elsewhere classified: Secondary | ICD-10-CM

## 2022-01-04 MED ORDER — PFIZER COVID-19 VAC BIVALENT 30 MCG/0.3ML IM SUSP
INTRAMUSCULAR | 0 refills | Status: DC
  Filled 2022-01-04: qty 0.3, 1d supply, fill #0

## 2022-01-04 NOTE — Therapy (Addendum)
[facility] [address] [place], [place], [address] Phone: [phone]   Fax:  [phone]  Physical Therapy Treatment and Late Entry DC Summary  Patient Details  Name: Nichole Delgado MRN: [id number] Date of Birth: [birth year] Referring Provider (PT): Dr Dub Mikes   Encounter Date: 01/04/2022   PT End of Session - 01/04/22 1244     Visit Number 8    Date for PT Re-Evaluation 01/15/22    Authorization Type Medicare/BCBS    Progress Note Due on Visit 10    PT Start Time 1230    PT Stop Time 1310    PT Time Calculation (min) 40 min    Activity Tolerance Patient tolerated treatment well    Behavior During Therapy [facility] for tasks assessed/performed             Past Medical History:  Diagnosis Date   Cervical spondylosis    Complex regional pain syndrome I    followed by pain clinic, pcp,  has spinal cord stimulator   DDD (degenerative disc disease), lumbar    GAD (generalized anxiety disorder)    GERD (gastroesophageal reflux disease)    History of basal cell carcinoma (BCC) excision    per pt moh's sx on nose in 2020   History of chronic bronchitis    uses inhaler prn   Hypertension    followed by pcp and dr hilty   Irritable bowel syndrome with diarrhea    followed by dr Ewing Schlein (gi)   Lesion of bladder    Migraines    neurology-- [facility] in [place]   Mixed dyslipidemia    followed by lipid clinic--- dr Rennis Golden   Neuropathy    OA (osteoarthritis)    PONV (postoperative nausea and vomiting)    severe   Pre-diabetes    S/P insertion of spinal 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   Stress incontinence    SUI (stress urinary incontinence, female)    Thyroid cyst    04-24-2021  pt stated recently dx and has been referred to endocrinologist   Vertigo    Wears glasses     Past Surgical History:  Procedure Laterality Date   BACK SURGERY     BLADDER SURGERY  05/12/2021   for stress  incontinence per pt   CATARACT EXTRACTION W/ INTRAOCULAR LENS  IMPLANT, BILATERAL  2005   COLONOSCOPY W/ POLYPECTOMY     CYSTOSCOPY WITH BIOPSY N/A 05/12/2021   Procedure: CYSTOSCOPY WITH BIOPSY AND FULGERATION;  Surgeon: Noel Christmas, MD;  Location: [facility];  Service: Urology;  Laterality: N/A;  30 MINS   FOOT SURGERY Right 2011   hammer toe   ORIF ANKLE FRACTURE Left 03/27/2017   Procedure: OPEN REDUCTION INTERNAL FIXATION (ORIF) BIMALLEOLAR ANKLE FRACTURE;  Surgeon: Sheral Apley, MD;  Location: [facility] OR;  Service: Orthopedics;  Laterality: Left;   ORIF CALCANEOUS FRACTURE Right 04/05/2017   Procedure: OPEN REDUCTION INTERNAL FIXATION (ORIF) CALCANEOUS AND FIBULA FRACTURE;  Surgeon: Sheral Apley, MD;  Location: [facility] OR;  Service: Orthopedics;  Laterality: Right;   POSTERIOR LUMBAR FUSION  09-08-2020  @[facility]    L5--S1   SPINAL CORD STIMULATOR INSERTION  08/15/2018   SUPRACERVICAL ABDOMINAL HYSTERECTOMY  1988   TONSILLECTOMY AND ADENOIDECTOMY  child    There were no vitals filed for this visit.   Subjective Assessment - 01/04/22 1242     Subjective Pt reports that she has been having problems with her neurostimulator and  has it turned off.  She hopes to keep it off until Thursday.  She reports that the Tramadol that she took has caused her some nausea.    Pertinent History mirgraine, chronic bronchitis, CRPS lower limb with spinal cord stimulator, essential HTN    Patient Stated Goals I would like to be more acitve, to be able to shop and travel without pain.    Currently in Pain? Yes    Pain Score 6     Pain Location Ankle    Pain Orientation Right    Pain Descriptors / Indicators Sharp    Pain Type Chronic pain                               [facility] Adult PT Treatment/Exercise - 01/04/22 0001       Manual Therapy   Manual Therapy Soft tissue mobilization;Passive ROM    Manual therapy comments in sitting    Soft tissue mobilization to  R ankle, foot, and calf secondary to muscle tightness and incision/scar hypersensitvity    Passive ROM to R ankle following STM      Ankle Exercises: Aerobic   Nustep L3 x6 min with PT present to discuss progress.   299 steps     Ankle Exercises: Seated   ABC's 1 rep    Heel Raises Both;20 reps    Toe Raise 20 reps    Other Seated Ankle Exercises 4 way ankle motion with yellow tband 1x10B      Ankle Exercises: Stretches   Gastroc Stretch 2 reps;20 seconds   on 2 inch step     Ankle Exercises: Standing   Rocker Board 2 minutes    Heel Raises Both;20 reps    Toe Raise 20 reps    Other Standing Ankle Exercises High Marching 2x10 bilat                       PT Short Term Goals - 12/28/21 1332       PT SHORT TERM GOAL #1   Title Pt will be independent with initial HEP.    Status Achieved               PT Long Term Goals - 01/04/22 1317       PT LONG TERM GOAL #1   Title Pt will be independent with advanced HEP.    Status On-going      PT LONG TERM GOAL #2   Title Pt will report at least a 50% improvement in symptoms to allow her to go walking or shopping.    Status On-going      PT LONG TERM GOAL #3   Title Pt will increase B ankle strength to at least 4/5 and B hip/knee strength to at least 4+/5 to allow her to perform functional transfers more easily.    Status On-going      PT LONG TERM GOAL #4   Title Pt will increase B ankle ROM to [facility] to allow her to more easily navigate steps.    Status Partially Met      PT LONG TERM GOAL #5   Title Pt will report no dizziness with various activities around her home and out in community.    Status Partially Met                   Plan - 01/04/22 1314  Clinical Impression Statement Ms Dantona presents with increased pain today secondary to having issues with her neurostimulator and having it turned off. She reports that she has a follow up appointment on Thursday and hopes that it will be  adjusted to a better setting at that time. Pt continues to report decreased pain in her R ankle following manual therapy and has less hypersensitivity noted. Pt continues to progress with improved ROM, despite increased pain today.    Personal Factors and Comorbidities Comorbidity 3+    Comorbidities mirgraine, chronic bronchitis, CRPS lower limb with spinal cord stimulator, essential HTN    PT Treatment/Interventions ADLs/Self Care Home Management;Aquatic Therapy;Canalith Repostioning;Cryotherapy;Iontophoresis 4mg /ml Dexamethasone;Moist Heat;Ultrasound;Gait training;Stair training;Functional mobility training;Therapeutic activities;Therapeutic exercise;Balance training;Neuromuscular re-education;Patient/family education;Manual techniques;Passive range of motion;Dry needling;Taping;Vasopneumatic Device;Joint Manipulations    PT Next Visit Plan progress and assess HEP, manual therapy as indicated, strengthening, ROM    Consulted and Agree with Plan of Care Patient             Patient will benefit from skilled therapeutic intervention in order to improve the following deficits and impairments:  Difficulty walking, Decreased activity tolerance, Pain, Decreased balance, Decreased strength, Impaired flexibility, Decreased range of motion  Visit Diagnosis: Pain in right ankle and joints of right foot  Muscle weakness (generalized)  Difficulty in walking, not elsewhere classified     Problem List Patient Active Problem List   Diagnosis Date Noted   Female stress incontinence 11/26/2020   CRPS (complex regional pain syndrome), lower limb with spinal cord simulator 11/26/2020   Essential hypertension 11/26/2020   Chronic bronchitis (HCC) 11/26/2020   Emotional disorder 11/26/2020   Menopausal symptoms 11/26/2020   Migraine 11/26/2020   Basal cell carcinoma (BCC) 11/26/2020   Other spondylosis with myelopathy, lumbar region 09/09/2020   Elevated blood-pressure reading, without diagnosis of  hypertension 08/05/2020   Spinal cord stimulator status 06/18/2020   Pain in left foot 02/13/2020   Chronic pain 11/06/2019   Hyperlipidemia, unspecified 11/06/2019   Peripheral neuropathic pain 09/27/2017   DDD (degenerative disc disease), lumbosacral    Left foot drop    GERD (gastroesophageal reflux disease) 03/26/2017   Dyslipidemia    Nicotine dependence with current use    IBS (irritable bowel syndrome)    Osteoporosis 02/24/2016   Osteoarthritis 02/24/2016   Insomnia 02/24/2016    PHYSICAL THERAPY DISCHARGE SUMMARY  Patient agrees to discharge. Patient goals were partially met. Patient is being discharged due to not returning since the last visit.   Reather Laurence, PT, DPT 01/04/2022, 1:18 PM  [facility] [address] [place], [place], [address] Phone: [phone]   Fax:  [phone]  Name: Nichole Delgado MRN: [id number] Date of Birth: August 28, 1951

## 2022-01-04 NOTE — Progress Notes (Signed)
° °  Covid-19 Vaccination Clinic  Name:  SERENITI WAN    MRN: [id number] DOB: [birth year]  01/04/2022  Ms. Middlekauff was observed post Covid-19 immunization for 15 minutes without incident. She was provided with Vaccine Information Sheet and instruction to access the V-Safe system.   Ms. Brusseau was instructed to call 911 with any severe reactions post vaccine: Difficulty breathing  Swelling of face and throat  A fast heartbeat  A bad rash all over body  Dizziness and weakness   Immunizations Administered     Name Date Dose VIS Date Route   Pfizer Covid-19 Vaccine Bivalent Booster 01/04/2022  3:03 PM 0.3 mL 08/19/2021 Intramuscular   Manufacturer: [place]   Lot: [id number]   [place]: [phone]

## 2022-01-05 ENCOUNTER — Telehealth: Payer: Self-pay | Admitting: Family Medicine

## 2022-01-05 DIAGNOSIS — Z6827 Body mass index (BMI) 27.0-27.9, adult: Secondary | ICD-10-CM | POA: Diagnosis not present

## 2022-01-05 DIAGNOSIS — Z01419 Encounter for gynecological examination (general) (routine) without abnormal findings: Secondary | ICD-10-CM | POA: Diagnosis not present

## 2022-01-05 NOTE — Chronic Care Management (AMB) (Signed)
°  Chronic Care Management   Outreach Note  01/05/2022 Name: CADYNCE GARRETTE MRN: [id number] DOB: 1951/04/01  Referred by: Vivi Barrack, MD Reason for referral : No chief complaint on file.   A second unsuccessful telephone outreach was attempted today. The patient was referred to pharmacist for assistance with care management and care coordination.  Follow Up Plan:   Tatjana Dellinger Upstream Scheduler

## 2022-01-05 NOTE — Chronic Care Management (AMB) (Signed)
°  Chronic Care Management   Note  01/05/2022 Name: Nichole Delgado MRN: [id number] DOB: Aug 02, 1951  Nichole Delgado is a 71 y.o. year old female who is a primary care patient of Vivi Barrack, MD. I reached out to [name] by phone today in response to a referral sent by Ms. Ferdie Ping PCP, Vivi Barrack, MD.   Ms. Puello was given information about Chronic Care Management services today including:  CCM service includes personalized support from designated clinical staff supervised by her physician, including individualized plan of care and coordination with other care providers 24/7 contact phone numbers for assistance for urgent and routine care needs. Service will only be billed when office clinical staff spend 20 minutes or more in a month to coordinate care. Only one practitioner may furnish and bill the service in a calendar month. The patient may stop CCM services at any time (effective at the end of the month) by phone call to the office staff.   Patient agreed to services and verbal consent obtained.   Follow up plan:PT [place]

## 2022-01-06 ENCOUNTER — Encounter: Payer: Medicare Other | Admitting: Rehabilitative and Restorative Service Providers"

## 2022-01-07 DIAGNOSIS — Z981 Arthrodesis status: Secondary | ICD-10-CM | POA: Diagnosis not present

## 2022-01-07 DIAGNOSIS — G894 Chronic pain syndrome: Secondary | ICD-10-CM | POA: Diagnosis not present

## 2022-01-07 DIAGNOSIS — M4327 Fusion of spine, lumbosacral region: Secondary | ICD-10-CM | POA: Diagnosis not present

## 2022-01-07 DIAGNOSIS — M792 Neuralgia and neuritis, unspecified: Secondary | ICD-10-CM | POA: Diagnosis not present

## 2022-01-11 ENCOUNTER — Other Ambulatory Visit: Payer: Self-pay | Admitting: Family Medicine

## 2022-01-15 ENCOUNTER — Ambulatory Visit: Payer: Medicare Other

## 2022-01-15 DIAGNOSIS — M79672 Pain in left foot: Secondary | ICD-10-CM | POA: Diagnosis not present

## 2022-01-15 DIAGNOSIS — M25571 Pain in right ankle and joints of right foot: Secondary | ICD-10-CM | POA: Diagnosis not present

## 2022-01-15 DIAGNOSIS — M79671 Pain in right foot: Secondary | ICD-10-CM | POA: Diagnosis not present

## 2022-01-15 DIAGNOSIS — M25572 Pain in left ankle and joints of left foot: Secondary | ICD-10-CM | POA: Diagnosis not present

## 2022-01-17 ENCOUNTER — Other Ambulatory Visit: Payer: Self-pay | Admitting: Family Medicine

## 2022-01-21 ENCOUNTER — Ambulatory Visit: Payer: Medicare Other

## 2022-01-29 ENCOUNTER — Telehealth: Payer: Self-pay | Admitting: Rehabilitative and Restorative Service Providers"

## 2022-01-29 NOTE — Telephone Encounter (Signed)
Called pt to follow up with her since she had cancelled last PT visits and was planning to follow up with MD.  Pt states that she is still hoping to get a referral to a neurologist, as she is still having increased pain.  Advised pt that PT would wait another week to assess if she was going to come back to services or if we would complete a discharge.  Pt stated her agreement.  Performed a telephonic FOTO and pt with score of 39%.

## 2022-02-01 NOTE — Progress Notes (Signed)
Chronic Care Management Pharmacy Note  02/16/2022 Name:  Nichole Delgado MRN:  [id number] DOB:  [birth year]  Summary: Initial visit with PharmD.  Having issues with access to Viberzi.  She complains of foot pain and is unhappy with her pain management doctor.  Plans to try another one but is requesting [place] to refill tramadol if need be during that time.  Recommendations/Changes made from today's visit: None - will assist with Viberzi  Plan: FU 6 months   Subjective: Nichole Delgado is an 71 y.o. year old female who is a primary patient of Jerline Pain, Algis Greenhouse, MD.  The CCM team was consulted for assistance with disease management and care coordination needs.    Engaged with patient by telephone for initial visit in response to provider referral for pharmacy case management and/or care coordination services.   Consent to Services:  The patient was given the following information about Chronic Care Management services today, agreed to services, and gave verbal consent: 1. CCM service includes personalized support from designated clinical staff supervised by the primary care provider, including individualized plan of care and coordination with other care providers 2. 24/7 contact phone numbers for assistance for urgent and routine care needs. 3. Service will only be billed when office clinical staff spend 20 minutes or more in a month to coordinate care. 4. Only one practitioner may furnish and bill the service in a calendar month. 5.The patient may stop CCM services at any time (effective at the end of the month) by phone call to the office staff. 6. The patient will be responsible for cost sharing (co-pay) of up to 20% of the service fee (after annual deductible is met). Patient agreed to services and consent obtained.  Patient Care Team: Vivi Barrack, MD as PCP - General (Family Medicine) Almedia Balls, MD as Consulting Physician (Orthopedic Surgery) Clydell Hakim, MD (Inactive) as  Consulting Physician (Anesthesiology) Leighton Ruff, MD (Inactive) as Consulting Physician (Family Medicine) Shon Hough, MD as Consulting Physician (Ophthalmology) Edythe Clarity, [facility] as Pharmacist (Pharmacist)  Recent office visits:  11/02/2021 VV (PCP) Vivi Barrack, MD; no medication changes indicated.   10/22/2021 OV (PCP) Vivi Barrack, MD; no medication changes indicated.   09/10/2021 VV Allwardt, Alyssa M, PA-C; no medication changes indicated.   Recent consult visits:  11/16/2021 OV (Otolaryngology) Spainhour, Ann Lions, PA-C; no medication changes indicated.   11/05/2021 OV (Pulmonology) Rigoberto Noel, MD; no medication changes indicated, recommends home sleep apnea test.   09/02/2021 OV [facility]) Carole Binning, no medication changes indicated.   Hospital visits:  None in previous 6 months  Objective:  Lab Results  Component Value Date   CREATININE 0.97 06/12/2021   BUN 12 06/12/2021   GFR 58.62 (L) 06/12/2021   GFRNONAA 61 06/12/2020   GFRAA 74 06/12/2020   NA 138 06/12/2021   K 3.9 06/12/2021   CALCIUM 10.2 06/12/2021   CO2 27 06/12/2021   GLUCOSE 95 06/12/2021    Lab Results  Component Value Date/Time   HGBA1C 5.8 03/31/2021 03:16 PM   HGBA1C 5.8 06/12/2020 12:00 AM   GFR 58.62 (L) 06/12/2021 02:27 PM   GFR 64.22 03/31/2021 03:16 PM    Last diabetic Eye exam: No results found for: HMDIABEYEEXA  Last diabetic Foot exam: No results found for: HMDIABFOOTEX   Lab Results  Component Value Date   CHOL 180 06/12/2020   HDL 78 (A) 06/12/2020   LDLCALC 83 06/12/2020   TRIG  117 06/12/2020    Hepatic Function Latest Ref Rng & Units 06/12/2021 03/31/2021 06/12/2020  Total Protein 6.0 - 8.3 g/dL - 6.4 -  Albumin 3.5 - 5.2 g/dL 4.4 4.1 4.2  AST 0 - 37 U/L - 17 16  ALT 0 - 35 U/L - 18 19  Alk Phosphatase 39 - 117 U/L - 69 -  Total Bilirubin 0.2 - 1.2 mg/dL - 0.3 -    Lab Results  Component Value  Date/Time   TSH 1.98 03/31/2021 03:16 PM    CBC Latest Ref Rng & Units 05/12/2021 03/31/2021 09/09/2020  WBC 4.0 - 10.5 K/uL - 7.5 12.1  Hemoglobin 12.0 - 15.0 g/dL 13.6 13.8 12.1  Hematocrit 36.0 - 46.0 % 40.0 40.8 37  Platelets 150.0 - 400.0 K/uL - 266.0 188    Lab Results  Component Value Date/Time   VD25OH 31.6 09/10/2019 12:00 AM   VD25OH 24.9 06/07/2019 12:00 AM    Clinical ASCVD: Yes  The 10-year ASCVD risk score (Arnett DK, et al., 2019) is: 17.3%*   Values used to calculate the score:     Age: 71 years     Sex: Female     Is Non-Hispanic African American: No     Diabetic: No     Tobacco smoker: Yes     Systolic Blood Pressure: 850 mmHg     Is BP treated: Yes     HDL Cholesterol: 78 mg/dL*     Total Cholesterol: 180 mg/dL*     * - Cholesterol units were assumed for this score calculation    Depression screen [facility] 2/9 11/02/2021 10/22/2021 07/27/2021  Decreased Interest 0 0 0  Down, Depressed, Hopeless 0 0 1  PHQ - 2 Score 0 0 1     Social History   Tobacco Use  Smoking Status Every Day   Packs/day: 1.00   Years: 45.00   Pack years: 45.00   Types: Cigarettes  Smokeless Tobacco Never   BP Readings from Last 3 Encounters:  11/05/21 104/80  10/22/21 127/79  08/20/21 (!) 144/67   Pulse Readings from Last 3 Encounters:  11/05/21 70  10/22/21 79  08/20/21 77   Wt Readings from Last 3 Encounters:  11/05/21 133 lb 12.8 oz (60.7 kg)  11/02/21 132 lb (59.9 kg)  10/22/21 132 lb (59.9 kg)   BMI Readings from Last 3 Encounters:  11/05/21 27.96 kg/m  11/02/21 27.59 kg/m  10/22/21 27.59 kg/m    Assessment/Interventions: Review of patient past medical history, allergies, medications, health status, including review of consultants reports, laboratory and other test data, was performed as part of comprehensive evaluation and provision of chronic care management services.   SDOH:  (Social Determinants of Health) assessments and interventions performed:  Yes  Financial Resource Strain: Low Risk    Difficulty of Paying Living Expenses: Not hard at all   Food Insecurity: No Food Insecurity   Worried About Charity fundraiser in the Last Year: Never true   Ran Out of Food in the Last Year: Never true    SDOH Screenings   Alcohol Screen: Not on file  Depression (PHQ2-9): Low Risk    PHQ-2 Score: 0  Financial Resource Strain: Low Risk    Difficulty of Paying Living Expenses: Not hard at all  Food Insecurity: No Food Insecurity   Worried About Charity fundraiser in the Last Year: Never true   Ran Out of Food in the Last Year: Never true  Housing: Low Risk  Last Housing Risk Score: 0  Physical Activity: Inactive   Days of Exercise per Week: 0 days   Minutes of Exercise per Session: 0 min  Social Connections: Moderately Isolated   Frequency of Communication with Friends and Family: More than three times a week   Frequency of Social Gatherings with Friends and Family: More than three times a week   Attends Religious Services: Never   Marine scientist or Organizations: No   Attends Music therapist: Never   Marital Status: Married  Stress: No Stress Concern Present   Feeling of Stress : Not at all  Tobacco Use: High Risk   Smoking Tobacco Use: Every Day   Smokeless Tobacco Use: Never   Passive Exposure: Not on file  Transportation Needs: No Transportation Needs   Lack of Transportation (Medical): No   Lack of Transportation (Non-Medical): No    CCM Care Plan  Allergies  Allergen Reactions   Penicillins Hives    Has patient had a PCN reaction causing immediate rash, facial/tongue/throat swelling, SOB or lightheadedness with hypotension: No Has patient had a PCN reaction causing severe rash involving mucus membranes or skin necrosis: Yes Has patient had a PCN reaction that required hospitalization No Has patient had a PCN reaction occurring within the last 10 years: No If all of the above answers are  "NO", then may proceed with Cephalosporin use.    Sulfa Antibiotics Hives and Rash   Other     States she has allergies to other seizure medications but doesn't know which ones   Codeine Nausea Only   Hydrocodone Rash   Keppra [Levetiracetam] Rash   Topamax [Topiramate] Nausea Only    Weight loss    Medications Reviewed Today     Reviewed by Edythe Clarity, [facility] (Pharmacist) on 02/16/22 at 1044  Med List Status: <None>   Medication Order Taking? Sig Documenting Provider Last Dose Status Informant  albuterol (VENTOLIN HFA) 108 (90 Base) MCG/ACT inhaler [id number] Yes Inhale 2 puffs into the lungs every 6 (six) hours as needed for wheezing or shortness of breath. Charlesetta Shanks, MD Taking Active Self  Azelaic Acid 15 % gel [id number] Yes Apply topically daily. After skin is thoroughly washed and patted dry, gently but thoroughly massage a thin film of azelaic acid cream into the affected area twice daily, in the morning and evening. [provider] Taking Active Self  azelastine (ASTELIN) 0.1 % nasal spray [id number] No Place 2 sprays into both nostrils 2 (two) times daily.  Patient not taking: Reported on 02/15/2022   Vivi Barrack, MD Not Taking Active   Ca Phosphate-Cholecalciferol 250-500 MG-UNIT CHEW [id number] Yes Chew 1 each by mouth 2 (two) times daily. [provider] Taking Active Self  Calcium Carb-Cholecalciferol (CALCIUM 500 +D PO) [id number] Yes Take 1 tablet by mouth daily. [provider] Taking Active Self  Cholecalciferol (VITAMIN D3) 50 MCG (2000 UT) TABS [id number] Yes Take 1 tablet by mouth daily. [provider] Taking Active Self  COVID-19 mRNA bivalent vaccine, Pfizer, (PFIZER COVID-19 [facility] BIVALENT) injection [id number] Yes Inject into the muscle. Carlyle Basques, MD Taking Active   dicyclomine (BENTYL) 10 MG capsule [id number] No Take 10 mg by mouth 3 (three) times daily as needed.  Patient not taking: Reported on 02/15/2022    [provider] Not Taking Active   Eluxadoline (VIBERZI) 75 MG TABS [id number] Yes Take 1 tablet by mouth 2 (two) times daily. [provider] Taking Active   estradiol (  CLIMARA - DOSED IN MG/24 HR) 0.1 mg/24hr patch [id number] Yes Place 0.1 mg onto the skin 2 (two) times a week. [provider] Taking Active   fluticasone (FLONASE) 50 MCG/ACT nasal spray [id number] Yes Place into both nostrils daily. [provider] Taking Active   ibuprofen (ADVIL,MOTRIN) 200 MG tablet [id number] Yes Take 400 mg by mouth every 6 (six) hours as needed for mild pain. [provider] Taking Active Self  loperamide (IMODIUM) 2 MG capsule [id number] Yes Take 2 mg by mouth as needed. [provider] Taking Active   losartan (COZAAR) 25 MG tablet [id number]  Take 1 tablet (25 mg total) by mouth daily. Pixie Casino, MD  Expired 12/29/21 2359   meclizine (ANTIVERT) 25 MG tablet [id number] Yes Take 25 mg by mouth 3 (three) times daily as needed for dizziness. [provider] Taking Active   omeprazole (PRILOSEC) 20 MG capsule [id number] Yes Take 1 capsule (20 mg total) by mouth 2 (two) times daily before a meal.  Patient taking differently: Take 20 mg by mouth 2 (two) times daily as needed.   Charlesetta Shanks, MD Taking Active Self  ondansetron (ZOFRAN) 8 MG tablet [id number] Yes Take by mouth every 8 (eight) hours as needed for nausea or vomiting. [provider] Taking Active Self  perphenazine (TRILAFON) 2 MG tablet [id number] Yes Take 2 mg by mouth at bedtime. [provider] Taking Active Self  pravastatin (PRAVACHOL) 40 MG tablet [id number] Yes TAKE 1 TABLET BY MOUTH AT BEDTIME Vivi Barrack, MD Taking Active   traMADol Veatrice Bourbon) 50 MG tablet [id number] Yes Take by mouth every 6 (six) hours as needed. [provider] Taking Active Self  zolpidem (AMBIEN) 10 MG tablet [id number] Yes Take 10 mg by mouth at bedtime as needed for sleep.  [provider] Taking Active Self           Med Note Merrilee Seashore Feb 15, 2022 11:27 AM) Taking half a tablet            Patient Active Problem List   Diagnosis Date Noted   Female stress incontinence 11/26/2020   CRPS (complex regional pain syndrome), lower limb with spinal cord simulator 11/26/2020   Essential hypertension 11/26/2020   Chronic bronchitis ([place]) 11/26/2020   Emotional disorder 11/26/2020   Menopausal symptoms 11/26/2020   Migraine 11/26/2020   Basal cell carcinoma (BCC) 11/26/2020   Other spondylosis with myelopathy, lumbar region 09/09/2020   Elevated blood-pressure reading, without diagnosis of hypertension 08/05/2020   Spinal cord stimulator status 06/18/2020   Pain in left foot 02/13/2020   Chronic pain 11/06/2019   Hyperlipidemia, unspecified 11/06/2019   Peripheral neuropathic pain 09/27/2017   DDD (degenerative disc disease), lumbosacral    Left foot drop    GERD (gastroesophageal reflux disease) 03/26/2017   Dyslipidemia    Nicotine dependence with current use    IBS (irritable bowel syndrome)    Osteoporosis 02/24/2016   Osteoarthritis 02/24/2016   Insomnia 02/24/2016    Immunization History  Administered Date(s) Administered   Fluad Quad(high Dose 65+) 11/26/2020   Influenza Split 09/24/2015, 10/09/2017, 09/24/2019   Influenza, High Dose Seasonal PF 09/26/2014, 09/19/2017   Influenza,inj,Quad PF,6-35 Mos 10/10/2018   Influenza-Unspecified 10/20/2021   PFIZER(Purple Top)SARS-COV-2 Vaccination 01/28/2020, 02/22/2020   Pfizer Covid-19 Vaccine Bivalent Booster 67yr & up 01/04/2022   Pneumococcal Polysaccharide-23 09/30/2001   Tdap 04/19/2008   Unspecified SARS-COV-2 Vaccination 01/28/2020, 02/22/2020, 10/20/2020   Zoster, Live 03/22/2008, 03/22/2012  Conditions to be addressed/monitored:  HTN, Migraine, GERD, Osteoporosis, HLD, Insomnia, Neuropathy  Care Plan : General Pharmacy (Adult)  Updates made by Edythe Clarity, RPH since 02/16/2022 12:00 AM     Problem: HTN, Migraine, GERD, Osteoporosis, HLD, Insomnia, Neuropathy   Priority: High  Onset Date: 02/15/2022     Long-Range Goal: Patient-Specific Goal   Start Date: 02/15/2022  Expected End Date: 08/16/2022  This Visit's Progress: On track  Priority: High  Note:   Current Barriers:  Foot pain Viberzi copay  Pharmacist Clinical Goal(s):  Patient will maintain control of BP as evidenced by monitoring  through collaboration with PharmD and provider.  Improve access to Viberzi  Interventions: 1:1 collaboration with Vivi Barrack, MD regarding development and update of comprehensive plan of care as evidenced by provider attestation and co-signature Inter-disciplinary care team collaboration (see longitudinal plan of care) Comprehensive medication review performed; medication list updated in electronic medical record  Hypertension (BP goal <130/80) -Controlled -Current treatment: Losartan 62m daily Appropriate, Effective, Safe, Accessible -Medications previously tried: propranolol  -Current home readings: not checking much at home -Current exercise habits: minimal due to foot pain -Denies hypotensive/hypertensive symptoms -Educated on BP goals and benefits of medications for prevention of heart attack, stroke and kidney damage; Importance of home blood pressure monitoring; Symptoms of hypotension and importance of maintaining adequate hydration; -Counseled to monitor BP at home a few times per week, document, and provide log at future appointments -Recommended to continue current medication No changes needed at this time  Hyperlipidemia: (LDL goal < 100) -Controlled -Current treatment: Pravastatin 412mdaily Appropriate, Query effective,  -Medications previously tried: none noted  -Current exercise habits: minimal due to foot pain -Educated on Cholesterol goals;  Benefits of statin for ASCVD risk reduction; Importance of  limiting foods high in cholesterol; -Recommended to continue current medication Needs updated lipid panel   GERD (Goal: Minimize symptoms) -Controlled -Current treatment  Omeprazole 2048mID - taking very rarely Appropriate, Effective, Safe, Accessible -Medications previously tried: none noted -She is taking very rarely, ok with this due to history of osteoporosis. -Patient probably needs updated DEXA scan last was in 2015.  -Recommended to continue current medication No changes at this time  Insomnia (Goal: Adequate sleep) -Controlled -Current treatment  Zolpidem 49m43me half tablet daily Appropriate, Effective, Safe, Accessible -Medications previously tried: none noted -She reports sleep is good ever since she started Ambien.  -Recommended to continue current medication No changes at this time.  Diarrhea (Goal: Reduce symptoms) -Not ideally controlled -Current treatment  Imodium 2mg 27mry Appropriate, Viberzi 75mg 72mopriate, Effective, Safe, Query accessible -Medications previously tried: none noted -Would prefer her to not use Imodium daily.  She was taking Viberzi and it was working but there has been a hold up with Dr. Magod Watt Climese or the manufacturer on the patient assistance application.  -Recommended to continue current medication Will assist in completing PAP aplication where able.  Patient Goals/Self-Care Activities Patient will:  - take medications as prescribed as evidenced by patient report and record review collaborate with provider on medication access solutions target a minimum of 150 minutes of moderate intensity exercise weekly  Follow Up Plan: The care management team will reach out to the patient again over the next 180 days.        Medication Assistance: Application for Viberzi  medication assistance program. in process.  Anticipated assistance start date unknown.  See plan of care for additional detail.  Compliance/Adherence/Medication fill  history: Care Gaps:  DEXA, colonoscopy  Star-Rating Drugs: Pravastatin 40 mg last filled 01/18/2022 90 DS Losartan 25 mg last filled 01/11/2022 90 DS  Patient's preferred pharmacy is:  [facility], [address] [place] [address] Phone: [phone] Fax: [phone]   We discussed: Benefits of medication synchronization, packaging and delivery as well as enhanced pharmacist oversight with [employer]. Patient decided to: Continue current medication management strategy  Care Plan and Follow Up Patient Decision:  Patient agrees to Care Plan and Follow-up.  Plan: The care management team will reach out to the patient again over the next 180 days.  Beverly Milch, PharmD Clinical Pharmacist  [facility] [phone]

## 2022-02-03 ENCOUNTER — Ambulatory Visit: Payer: Medicare Other

## 2022-02-08 ENCOUNTER — Telehealth: Payer: Self-pay | Admitting: Pharmacist

## 2022-02-08 NOTE — Progress Notes (Signed)
Chronic Care Management Pharmacy Assistant   Name: Nichole Delgado  MRN: [id number] DOB: [birth year]   Reason for Encounter: Chart Review For Initial Visit With Clinical Pharmacist   Conditions to be addressed/monitored: HTN, GERD, Osteoporosis, HLD, CRPS  Primary concerns for visit include: HTN, CRPS  Recent office visits:  11/02/2021 VV (PCP) Vivi Barrack, MD; no medication changes indicated.  10/22/2021 OV (PCP) Vivi Barrack, MD; no medication changes indicated.  09/10/2021 VV Allwardt, Alyssa M, PA-C; no medication changes indicated.  Recent consult visits:  11/16/2021 OV (Otolaryngology) Spainhour, Ann Lions, PA-C; no medication changes indicated.  11/05/2021 OV (Pulmonology) Rigoberto Noel, MD; no medication changes indicated, recommends home sleep apnea test.  09/02/2021 OV [facility]) Carole Binning, no medication changes indicated.  Hospital visits:  None in previous 6 months  Medications: Outpatient Encounter Medications as of 02/08/2022  Medication Sig   albuterol (VENTOLIN HFA) 108 (90 Base) MCG/ACT inhaler Inhale 2 puffs into the lungs every 6 (six) hours as needed for wheezing or shortness of breath.   Azelaic Acid 15 % gel Apply topically daily. After skin is thoroughly washed and patted dry, gently but thoroughly massage a thin film of azelaic acid cream into the affected area twice daily, in the morning and evening.   azelastine (ASTELIN) 0.1 % nasal spray Place 2 sprays into both nostrils 2 (two) times daily. (Patient not taking: Reported on 11/05/2021)   Ca Phosphate-Cholecalciferol 250-500 MG-UNIT CHEW Chew 1 each by mouth 2 (two) times daily.   Calcium Carb-Cholecalciferol (CALCIUM 500 +D PO) Take 1 tablet by mouth daily.   Cholecalciferol (VITAMIN D3) 50 MCG (2000 UT) TABS Take 1 tablet by mouth daily.   COVID-19 mRNA bivalent vaccine, Pfizer, (PFIZER COVID-19 VAC BIVALENT) injection Inject into the muscle.    dicyclomine (BENTYL) 10 MG capsule Take 10 mg by mouth 3 (three) times daily as needed.   Eluxadoline (VIBERZI) 75 MG TABS Take 1 tablet by mouth 2 (two) times daily.   estradiol (CLIMARA - DOSED IN MG/24 HR) 0.1 mg/24hr patch Place 0.1 mg onto the skin 2 (two) times a week.   ibuprofen (ADVIL,MOTRIN) 200 MG tablet Take 400 mg by mouth every 6 (six) hours as needed for mild pain.   loperamide (IMODIUM) 2 MG capsule Take 2 mg by mouth as needed.   losartan (COZAAR) 25 MG tablet Take 1 tablet (25 mg total) by mouth daily.   meclizine (ANTIVERT) 25 MG tablet Take 25 mg by mouth 3 (three) times daily as needed for dizziness.   omeprazole (PRILOSEC) 20 MG capsule Take 1 capsule (20 mg total) by mouth 2 (two) times daily before a meal. (Patient taking differently: Take 20 mg by mouth 2 (two) times daily as needed.)   ondansetron (ZOFRAN) 8 MG tablet Take by mouth every 8 (eight) hours as needed for nausea or vomiting.   perphenazine (TRILAFON) 2 MG tablet Take 2 mg by mouth at bedtime.   pravastatin (PRAVACHOL) 40 MG tablet TAKE 1 TABLET BY MOUTH AT BEDTIME   traMADol (ULTRAM) 50 MG tablet Take by mouth every 6 (six) hours as needed.   zolpidem (AMBIEN) 10 MG tablet Take 10 mg by mouth at bedtime as needed for sleep.   No facility-administered encounter medications on file as of 02/08/2022.   Current Medications: Pravastatin 40 mg last filled 01/18/2022 90 DS Losartan 25 mg last filled 01/11/2022 90 DS Azelastine 0.1% nasal spray last filled 08/03/2021 25 DS - no longer  using Flonase - buys otc Vitamin D3 2000 units daily Calcium 500 + D takes daily Azelaic Acid 15% gel last filled 05/28/2020 30 DS Tramadol 50 mg last filled 11/11/2021 30 DS prn Ondansetron 8 mg last filled 11/17/2021 7 DS prn Meclizine 25 mg takes as needed Perphenazine 2 mg last filled 01/12/2022 90 DS Estradiol 0.1mg /24 hr patch last filled 01/06/2022 85 DS Loperamide 2 mg takes daily Dicyclomine 10 mg last filled  03/11/2021 30 DS Omeprazole 20 mg takes "rarely" Albuterol 90 mcg/act last filled 07/15/2020 25 DS Ca Phosphate-Cholecalciferol 250-500 mg-unit Ibuprofen 200 mg Viberzi 75 mg last filled 07/02/2021 - not currently taking will start back once she is able to get the medication Zolpidem 10 mg last filled 12/31/2021 30 DS  Patient Questions: Any side effects from any medications?  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 I also have two bars and four screws in my back."  Do you have any symptoms or problems not managed by your medications? Patient states she does not have any symptoms or problems that is not currently managed by her medications.  Any concerns about your health right now? "Just being able to walk and not end up in a wheel chair."  Has your provider asked that you check blood pressure, blood sugar, or follow special diet at home? She checks her blood pressure occasionally. She states she tries to stay away from sugar and carbs.  Do you get any type of exercise on a regular basis? Patient states she is unable to exercise due to foot and pain back.  Can you think of a goal you would like to reach for your health? "I would like my life back."  Do you have any problems getting your medications? Patient currently has a pending patient assistance application for Viberzi. She states she is planning on calling and checking on this application soon. Assistance was offered to help with her application. She states she will let us know if she needs our help.  Is there anything that you would like to discuss during the appointment?  Patient states she does not have anything she would like to discuss at this time.  Please bring medications and supplements to appointment  Care Gaps: Medicare Annual Wellness: Completed 07/27/2021 Hemoglobin A1C: 5.8% on  03/31/2021 Colonoscopy: Overdue since 02/12/2011 Dexa Scan: Completed Mammogram: Ordered on 12/22/2021  Future Appointments  Date Time Provider [place]  02/15/2022 11:00 AM [facility] CCM PHARMACIST [facility]  06/16/2022  1:00 PM Shamleffer, Melanie Crazier, MD [facility] None  08/09/2022 11:45 AM [facility] HEALTH COACH [facility]    Star Rating Drugs: Pravastatin 40 mg last filled 01/18/2022 90 DS Losartan 25 mg last filled 01/11/2022 90 DS  [name], [place] Pharmacist Assistant [phone]

## 2022-02-15 ENCOUNTER — Ambulatory Visit (INDEPENDENT_AMBULATORY_CARE_PROVIDER_SITE_OTHER): Payer: Medicare Other | Admitting: Pharmacist

## 2022-02-15 DIAGNOSIS — K219 Gastro-esophageal reflux disease without esophagitis: Secondary | ICD-10-CM

## 2022-02-15 DIAGNOSIS — K580 Irritable bowel syndrome with diarrhea: Secondary | ICD-10-CM

## 2022-02-15 DIAGNOSIS — E785 Hyperlipidemia, unspecified: Secondary | ICD-10-CM

## 2022-02-15 DIAGNOSIS — I10 Essential (primary) hypertension: Secondary | ICD-10-CM

## 2022-02-16 DIAGNOSIS — M5416 Radiculopathy, lumbar region: Secondary | ICD-10-CM | POA: Diagnosis not present

## 2022-02-16 DIAGNOSIS — Z6827 Body mass index (BMI) 27.0-27.9, adult: Secondary | ICD-10-CM | POA: Diagnosis not present

## 2022-02-16 DIAGNOSIS — I10 Essential (primary) hypertension: Secondary | ICD-10-CM | POA: Diagnosis not present

## 2022-02-16 DIAGNOSIS — Z9689 Presence of other specified functional implants: Secondary | ICD-10-CM | POA: Diagnosis not present

## 2022-02-16 DIAGNOSIS — E785 Hyperlipidemia, unspecified: Secondary | ICD-10-CM | POA: Diagnosis not present

## 2022-02-16 DIAGNOSIS — G90523 Complex regional pain syndrome I of lower limb, bilateral: Secondary | ICD-10-CM | POA: Diagnosis not present

## 2022-02-16 NOTE — Patient Instructions (Addendum)
Visit Information   Goals Addressed             This Visit's Progress    Track My Symptoms-Irritable Bowel Syndrome       Follow Up Date 08/16/22    - track what makes symptoms worse and what makes them better    Why is this important?   Keeping track of symptoms that you have helps you to understand your condition. You will also learn what works to manage it.  You and your doctor will then be able to come up with the best treatment plan for you.    Notes: Workin on [employer] assistance!       Patient Care Plan: General Pharmacy (Adult)     Problem Identified: HTN, Migraine, GERD, Osteoporosis, HLD, Insomnia, Neuropathy   Priority: High  Onset Date: 02/15/2022     Long-Range Goal: Patient-Specific Goal   Start Date: 02/15/2022  Expected End Date: 08/16/2022  This Visit's Progress: On track  Priority: High  Note:   Current Barriers:  Foot pain Viberzi copay  Pharmacist Clinical Goal(s):  Patient will maintain control of BP as evidenced by monitoring  through collaboration with PharmD and provider.  Improve access to Viberzi  Interventions: 1:1 collaboration with Nichole Barrack, MD regarding development and update of comprehensive plan of care as evidenced by provider attestation and co-signature Inter-disciplinary care team collaboration (see longitudinal plan of care) Comprehensive medication review performed; medication list updated in electronic medical record  Hypertension (BP goal <130/80) -Controlled -Current treatment: Losartan 25mg  daily Appropriate, Effective, Safe, Accessible -Medications previously tried: propranolol  -Current home readings: not checking much at home -Current exercise habits: minimal due to foot pain -Denies hypotensive/hypertensive symptoms -Educated on BP goals and benefits of medications for prevention of heart attack, stroke and kidney damage; Importance of home blood pressure monitoring; Symptoms of hypotension and importance of  maintaining adequate hydration; -Counseled to monitor BP at home a few times per week, document, and provide log at future appointments -Recommended to continue current medication No changes needed at this time  Hyperlipidemia: (LDL goal < 100) -Controlled -Current treatment: Pravastatin 40mg  daily Appropriate, Query effective,  -Medications previously tried: none noted  -Current exercise habits: minimal due to foot pain -Educated on Cholesterol goals;  Benefits of statin for ASCVD risk reduction; Importance of limiting foods high in cholesterol; -Recommended to continue current medication Needs updated lipid panel   GERD (Goal: Minimize symptoms) -Controlled -Current treatment  Omeprazole 20mg  BID - taking very rarely Appropriate, Effective, Safe, Accessible -Medications previously tried: none noted -She is taking very rarely, ok with this due to history of osteoporosis. -Patient probably needs updated DEXA scan last was in 2015.  -Recommended to continue current medication No changes at this time  Insomnia (Goal: Adequate sleep) -Controlled -Current treatment  Zolpidem 10mg  one half tablet daily Appropriate, Effective, Safe, Accessible -Medications previously tried: none noted -She reports sleep is good ever since she started Ambien.  -Recommended to continue current medication No changes at this time.  Diarrhea (Goal: Reduce symptoms) -Not ideally controlled -Current treatment  Imodium 2mg  Query Appropriate, Viberzi 75mg  Appropriate, Effective, Safe, Query accessible -Medications previously tried: none noted -Would prefer her to not use Imodium daily.  She was taking Viberzi and it was working but there has been a hold up with Dr. Watt Climes office or the manufacturer on the patient assistance application.  -Recommended to continue current medication Will assist in completing PAP aplication where able.  Patient Goals/Self-Care Activities Patient  will:  - take  medications as prescribed as evidenced by patient report and record review collaborate with provider on medication access solutions target a minimum of 150 minutes of moderate intensity exercise weekly  Follow Up Plan: The care management team will reach out to the patient again over the next 180 days.       Nichole Delgado was given information about Chronic Care Management services today including:  CCM service includes personalized support from designated clinical staff supervised by her physician, including individualized plan of care and coordination with other care providers 24/7 contact phone numbers for assistance for urgent and routine care needs. Standard insurance, coinsurance, copays and deductibles apply for chronic care management only during months in which we provide at least 20 minutes of these services. Most insurances cover these services at 100%, however patients may be responsible for any copay, coinsurance and/or deductible if applicable. This service may help you avoid the need for more expensive face-to-face services. Only one practitioner may furnish and bill the service in a calendar month. The patient may stop CCM services at any time (effective at the end of the month) by phone call to the office staff.  Patient agreed to services and verbal consent obtained.   The patient verbalized understanding of instructions, educational materials, and care plan provided today and agreed to receive a mailed copy of patient instructions, educational materials, and care plan.  Telephone follow up appointment with pharmacy team member scheduled for: 6 months  Nichole Delgado, [place], PharmD Clinical Pharmacist  [facility] [phone]

## 2022-02-22 DIAGNOSIS — G90529 Complex regional pain syndrome I of unspecified lower limb: Secondary | ICD-10-CM | POA: Diagnosis not present

## 2022-02-22 DIAGNOSIS — G5793 Unspecified mononeuropathy of bilateral lower limbs: Secondary | ICD-10-CM | POA: Diagnosis not present

## 2022-02-23 ENCOUNTER — Encounter: Payer: Self-pay | Admitting: Pulmonary Disease

## 2022-02-26 ENCOUNTER — Telehealth: Payer: Self-pay | Admitting: Internal Medicine

## 2022-02-26 DIAGNOSIS — E782 Mixed hyperlipidemia: Secondary | ICD-10-CM

## 2022-02-26 NOTE — Telephone Encounter (Signed)
Lipid panel ordered, lab reminder sent via MyChart ?

## 2022-03-02 DIAGNOSIS — F250 Schizoaffective disorder, bipolar type: Secondary | ICD-10-CM | POA: Diagnosis not present

## 2022-03-10 DIAGNOSIS — H43813 Vitreous degeneration, bilateral: Secondary | ICD-10-CM | POA: Diagnosis not present

## 2022-03-10 DIAGNOSIS — H04123 Dry eye syndrome of bilateral lacrimal glands: Secondary | ICD-10-CM | POA: Diagnosis not present

## 2022-03-10 DIAGNOSIS — H3500 Unspecified background retinopathy: Secondary | ICD-10-CM | POA: Diagnosis not present

## 2022-03-10 DIAGNOSIS — H524 Presbyopia: Secondary | ICD-10-CM | POA: Diagnosis not present

## 2022-03-11 ENCOUNTER — Other Ambulatory Visit: Payer: Self-pay

## 2022-03-11 ENCOUNTER — Ambulatory Visit
Admission: RE | Admit: 2022-03-11 | Discharge: 2022-03-11 | Disposition: A | Discharge status: Home / Self Care | Payer: Medicare Other | Source: Ambulatory Visit | Attending: Family Medicine | Admitting: Family Medicine

## 2022-03-11 DIAGNOSIS — Z1231 Encounter for screening mammogram for malignant neoplasm of breast: Secondary | ICD-10-CM | POA: Diagnosis not present

## 2022-03-12 ENCOUNTER — Other Ambulatory Visit: Payer: Self-pay | Admitting: Family Medicine

## 2022-03-12 DIAGNOSIS — R928 Other abnormal and inconclusive findings on diagnostic imaging of breast: Secondary | ICD-10-CM

## 2022-03-19 DIAGNOSIS — G5772 Causalgia of left lower limb: Secondary | ICD-10-CM | POA: Diagnosis not present

## 2022-03-30 ENCOUNTER — Other Ambulatory Visit: Payer: Medicare Other

## 2022-03-31 ENCOUNTER — Other Ambulatory Visit: Payer: Medicare Other

## 2022-04-02 DIAGNOSIS — G90521 Complex regional pain syndrome I of right lower limb: Secondary | ICD-10-CM | POA: Diagnosis not present

## 2022-04-05 DIAGNOSIS — L814 Other melanin hyperpigmentation: Secondary | ICD-10-CM | POA: Diagnosis not present

## 2022-04-05 DIAGNOSIS — L538 Other specified erythematous conditions: Secondary | ICD-10-CM | POA: Diagnosis not present

## 2022-04-05 DIAGNOSIS — D1801 Hemangioma of skin and subcutaneous tissue: Secondary | ICD-10-CM | POA: Diagnosis not present

## 2022-04-05 DIAGNOSIS — Z85828 Personal history of other malignant neoplasm of skin: Secondary | ICD-10-CM | POA: Diagnosis not present

## 2022-04-05 DIAGNOSIS — Z08 Encounter for follow-up examination after completed treatment for malignant neoplasm: Secondary | ICD-10-CM | POA: Diagnosis not present

## 2022-04-05 DIAGNOSIS — Z789 Other specified health status: Secondary | ICD-10-CM | POA: Diagnosis not present

## 2022-04-05 DIAGNOSIS — L298 Other pruritus: Secondary | ICD-10-CM | POA: Diagnosis not present

## 2022-04-05 DIAGNOSIS — R208 Other disturbances of skin sensation: Secondary | ICD-10-CM | POA: Diagnosis not present

## 2022-04-05 DIAGNOSIS — L820 Inflamed seborrheic keratosis: Secondary | ICD-10-CM | POA: Diagnosis not present

## 2022-04-05 DIAGNOSIS — L821 Other seborrheic keratosis: Secondary | ICD-10-CM | POA: Diagnosis not present

## 2022-04-07 DIAGNOSIS — M25571 Pain in right ankle and joints of right foot: Secondary | ICD-10-CM | POA: Diagnosis not present

## 2022-04-07 DIAGNOSIS — M25572 Pain in left ankle and joints of left foot: Secondary | ICD-10-CM | POA: Diagnosis not present

## 2022-04-13 ENCOUNTER — Ambulatory Visit
Admission: RE | Admit: 2022-04-13 | Discharge: 2022-04-13 | Disposition: A | Discharge status: Home / Self Care | Payer: Medicare Other | Source: Ambulatory Visit | Attending: Family Medicine | Admitting: Family Medicine

## 2022-04-13 DIAGNOSIS — R928 Other abnormal and inconclusive findings on diagnostic imaging of breast: Secondary | ICD-10-CM

## 2022-04-13 DIAGNOSIS — R922 Inconclusive mammogram: Secondary | ICD-10-CM | POA: Diagnosis not present

## 2022-04-21 ENCOUNTER — Telehealth: Payer: Self-pay | Admitting: Pharmacist

## 2022-04-21 NOTE — Progress Notes (Signed)
Chronic Care Management Pharmacy Assistant   Name: Nichole Delgado  MRN: [id number] DOB: [birth year]  Reason for Encounter: General Adherence Call    Recent office visits:  None  Recent consult visits:  04/13/2022 OV ([place]) Davy Pique MD Gerrit Friends; no medication changes indicated.  Hospital visits:  None in previous 6 months  Medications: Outpatient Encounter Medications as of 04/21/2022  Medication Sig Note   albuterol (VENTOLIN HFA) 108 (90 Base) MCG/ACT inhaler Inhale 2 puffs into the lungs every 6 (six) hours as needed for wheezing or shortness of breath.    Azelaic Acid 15 % gel Apply topically daily. After skin is thoroughly washed and patted dry, gently but thoroughly massage a thin film of azelaic acid cream into the affected area twice daily, in the morning and evening.    azelastine (ASTELIN) 0.1 % nasal spray Place 2 sprays into both nostrils 2 (two) times daily. (Patient not taking: Reported on 02/15/2022)    Ca Phosphate-Cholecalciferol 250-500 MG-UNIT CHEW Chew 1 each by mouth 2 (two) times daily.    Calcium Carb-Cholecalciferol (CALCIUM 500 +D PO) Take 1 tablet by mouth daily.    Cholecalciferol (VITAMIN D3) 50 MCG (2000 UT) TABS Take 1 tablet by mouth daily.    COVID-19 mRNA bivalent vaccine, Pfizer, (PFIZER COVID-19 VAC BIVALENT) injection Inject into the muscle.    dicyclomine (BENTYL) 10 MG capsule Take 10 mg by mouth 3 (three) times daily as needed. (Patient not taking: Reported on 02/15/2022)    Eluxadoline (VIBERZI) 75 MG TABS Take 1 tablet by mouth 2 (two) times daily.    estradiol (CLIMARA - DOSED IN MG/24 HR) 0.1 mg/24hr patch Place 0.1 mg onto the skin 2 (two) times a week.    fluticasone (FLONASE) 50 MCG/ACT nasal spray Place into both nostrils daily.    ibuprofen (ADVIL,MOTRIN) 200 MG tablet Take 400 mg by mouth every 6 (six) hours as needed for mild pain.    loperamide (IMODIUM) 2 MG capsule Take 2 mg by mouth as needed.     losartan (COZAAR) 25 MG tablet Take 1 tablet (25 mg total) by mouth daily.    meclizine (ANTIVERT) 25 MG tablet Take 25 mg by mouth 3 (three) times daily as needed for dizziness.    omeprazole (PRILOSEC) 20 MG capsule Take 1 capsule (20 mg total) by mouth 2 (two) times daily before a meal. (Patient taking differently: Take 20 mg by mouth 2 (two) times daily as needed.)    ondansetron (ZOFRAN) 8 MG tablet Take by mouth every 8 (eight) hours as needed for nausea or vomiting.    perphenazine (TRILAFON) 2 MG tablet Take 2 mg by mouth at bedtime.    pravastatin (PRAVACHOL) 40 MG tablet TAKE 1 TABLET BY MOUTH AT BEDTIME    traMADol (ULTRAM) 50 MG tablet Take by mouth every 6 (six) hours as needed.    zolpidem (AMBIEN) 10 MG tablet Take 10 mg by mouth at bedtime as needed for sleep. 02/15/2022: Taking half a tablet   No facility-administered encounter medications on file as of 04/21/2022.   Contacted [name] for General Review Call   Chart Review:  Have there been any documented new, changed, or discontinued medications since last visit? No  Has there been any documented recent hospitalizations or ED visits since last visit with Clinical Pharmacist? No  Adherence Review:  Does the Clinical Pharmacist Assistant have access to adherence rates? Yes Adherence rates for STAR metric medications  Losartan 25  mg last filled 01/11/2022 90 DS Pravastatin 40 mg last filled 01/18/2022 90 DS Does the patient have >5 day gap between last estimated fill dates for any of the above medications or other medication gaps? No Reason for medication gaps.   Disease State Questions:  Able to connect with Patient? Yes Did patient have any problems with their health recently? No Note problems and Concerns: Have you had any admissions or emergency room visits or worsening of your condition(s) since last visit? No Details of ED visit, hospital visit and/or worsening condition(s): Have you had any visits  with new specialists or providers since your last visit? No Explain: Have you had any new health care problem(s) since your last visit? No New problem(s) reported: Have you run out of any of your medications since you last spoke with clinical pharmacist? No What caused you to run out of your medications? Are there any medications you are not taking as prescribed? Yes What kept you from taking your medications as prescribed? Are you having any issues or side effects with your medications? Yes Note of issues or side effects: Patient states she is still currently waiting on patient assistance application for Viberzi. She states there was an error on the application. She states she spoke with the patient assistance program today. She would like for me to call in about a week to check on this for her. Do you have any other health concerns or questions you want to discuss with your Clinical Pharmacist before your next visit? No Note additional concerns and questions from Patient. Are there any health concerns that you feel we can do a better job addressing? No Note Patient's response. Are you having any problems with any of the following since the last visit: (select all that apply)  None  Details: 12. Any falls since last visit? No  Details: 13. Any increased or uncontrolled pain since last visit? Yes  Details: Patient states she has a lot of pain in her foot and ankle. She states she really needs to have surgery for repairs but is unable to at the moment due to her daughter having surgery recently. 14. Next visit Type: No visit scheduled. Patient states she will schedule a visit at a later time.   15. Additional Details? No   Care Gaps: Medicare Annual Wellness: Completed 07/27/2021 Hemoglobin A1C: 5.8% on 03/31/2021 Colonoscopy: Overdue since 02/12/2011 Dexa Scan: Completed Mammogram: Completed on 04/13/2022  Future Appointments  Date Time Provider [place]  06/16/2022  1:00 PM  Shamleffer, Melanie Crazier, MD [facility] None  07/30/2022 11:15 AM Pixie Casino, MD [facility]  08/09/2022 11:45 AM [facility] HEALTH COACH [facility]  08/23/2022  3:45 PM [facility] CCM PHARMACIST [facility]   Star Rating Drugs: Losartan 25 mg last filled 01/11/2022 90 DS Pravastatin 40 mg last filld 01/18/2022 90 DS  [name], [place] Pharmacist Assistant [phone]

## 2022-05-05 DIAGNOSIS — M79671 Pain in right foot: Secondary | ICD-10-CM | POA: Diagnosis not present

## 2022-05-05 DIAGNOSIS — Z79891 Long term (current) use of opiate analgesic: Secondary | ICD-10-CM | POA: Diagnosis not present

## 2022-05-07 ENCOUNTER — Other Ambulatory Visit: Payer: Self-pay | Admitting: Internal Medicine

## 2022-05-07 ENCOUNTER — Telehealth: Payer: Self-pay | Admitting: Pharmacist

## 2022-05-07 ENCOUNTER — Telehealth: Payer: Self-pay | Admitting: Family Medicine

## 2022-05-07 NOTE — Progress Notes (Signed)
    Chronic Care Management Pharmacy Assistant   Name: Nichole Delgado  MRN: [id number] DOB: Nov 13, 1951   Reason for Encounter: Patient Assistance Documentation    Medications: Outpatient Encounter Medications as of 05/07/2022  Medication Sig Note   albuterol (VENTOLIN HFA) 108 (90 Base) MCG/ACT inhaler Inhale 2 puffs into the lungs every 6 (six) hours as needed for wheezing or shortness of breath.    Azelaic Acid 15 % gel Apply topically daily. After skin is thoroughly washed and patted dry, gently but thoroughly massage a thin film of azelaic acid cream into the affected area twice daily, in the morning and evening.    azelastine (ASTELIN) 0.1 % nasal spray Place 2 sprays into both nostrils 2 (two) times daily. (Patient not taking: Reported on 02/15/2022)    Ca Phosphate-Cholecalciferol 250-500 MG-UNIT CHEW Chew 1 each by mouth 2 (two) times daily.    Calcium Carb-Cholecalciferol (CALCIUM 500 +D PO) Take 1 tablet by mouth daily.    Cholecalciferol (VITAMIN D3) 50 MCG (2000 UT) TABS Take 1 tablet by mouth daily.    COVID-19 mRNA bivalent vaccine, Pfizer, (PFIZER COVID-19 VAC BIVALENT) injection Inject into the muscle.    dicyclomine (BENTYL) 10 MG capsule Take 10 mg by mouth 3 (three) times daily as needed. (Patient not taking: Reported on 02/15/2022)    Eluxadoline (VIBERZI) 75 MG TABS Take 1 tablet by mouth 2 (two) times daily.    estradiol (CLIMARA - DOSED IN MG/24 HR) 0.1 mg/24hr patch Place 0.1 mg onto the skin 2 (two) times a week.    fluticasone (FLONASE) 50 MCG/ACT nasal spray Place into both nostrils daily.    ibuprofen (ADVIL,MOTRIN) 200 MG tablet Take 400 mg by mouth every 6 (six) hours as needed for mild pain.    loperamide (IMODIUM) 2 MG capsule Take 2 mg by mouth as needed.    losartan (COZAAR) 25 MG tablet Take 1 tablet (25 mg total) by mouth daily.    meclizine (ANTIVERT) 25 MG tablet Take 25 mg by mouth 3 (three) times daily as needed for dizziness.    omeprazole  (PRILOSEC) 20 MG capsule Take 1 capsule (20 mg total) by mouth 2 (two) times daily before a meal. (Patient taking differently: Take 20 mg by mouth 2 (two) times daily as needed.)    ondansetron (ZOFRAN) 8 MG tablet Take by mouth every 8 (eight) hours as needed for nausea or vomiting.    perphenazine (TRILAFON) 2 MG tablet Take 2 mg by mouth at bedtime.    pravastatin (PRAVACHOL) 40 MG tablet TAKE 1 TABLET BY MOUTH AT BEDTIME    traMADol (ULTRAM) 50 MG tablet Take by mouth every 6 (six) hours as needed.    zolpidem (AMBIEN) 10 MG tablet Take 10 mg by mouth at bedtime as needed for sleep. 02/15/2022: Taking half a tablet   No facility-administered encounter medications on file as of 05/07/2022.   -I called and spoke with [name] to inquire on the status of Viberzi. This application was initially approved, however, they did not have all of the information from Dr. Perley Jain office so it was cancelled until they receive a prescription with all of the required information.   -Patient is aware, she is going to contact Dr. Perley Jain office and request for them to fax in a new prescription with their NPI number.  [name], [name] Pharmacist Assistant [phone]

## 2022-05-07 NOTE — Telephone Encounter (Signed)
Pt states her application for Viberzi keeps getting denied due to Dr Perley Jain office not filling out the paperwork correctly. She is asking for assistance and asked for me to direct this request April and [name].

## 2022-05-11 NOTE — Telephone Encounter (Signed)
Hey!  Do you think we could reach out to patient and program to see what we might could do to help this application along?

## 2022-05-12 ENCOUNTER — Other Ambulatory Visit: Payer: Self-pay | Admitting: Family Medicine

## 2022-05-27 ENCOUNTER — Ambulatory Visit (INDEPENDENT_AMBULATORY_CARE_PROVIDER_SITE_OTHER): Payer: Medicare Other | Admitting: Nurse Practitioner

## 2022-05-27 ENCOUNTER — Ambulatory Visit (INDEPENDENT_AMBULATORY_CARE_PROVIDER_SITE_OTHER): Payer: Medicare Other | Admitting: Pulmonary Disease

## 2022-05-27 ENCOUNTER — Encounter: Payer: Self-pay | Admitting: Nurse Practitioner

## 2022-05-27 VITALS — BP 110/70 | HR 72 | Temp 98.1°F | Ht 60.0 in | Wt 133.0 lb

## 2022-05-27 DIAGNOSIS — J410 Simple chronic bronchitis: Secondary | ICD-10-CM | POA: Diagnosis not present

## 2022-05-27 DIAGNOSIS — F17210 Nicotine dependence, cigarettes, uncomplicated: Secondary | ICD-10-CM | POA: Diagnosis not present

## 2022-05-27 DIAGNOSIS — J4540 Moderate persistent asthma, uncomplicated: Secondary | ICD-10-CM

## 2022-05-27 DIAGNOSIS — Z720 Tobacco use: Secondary | ICD-10-CM | POA: Insufficient documentation

## 2022-05-27 LAB — CBC WITH DIFFERENTIAL/PLATELET
Basophils Absolute: 0.1 10*3/uL (ref 0.0–0.1)
Basophils Relative: 0.8 % (ref 0.0–3.0)
Eosinophils Absolute: 0.1 10*3/uL (ref 0.0–0.7)
Eosinophils Relative: 0.8 % (ref 0.0–5.0)
HCT: 42.2 % (ref 36.0–46.0)
Hemoglobin: 14.2 g/dL (ref 12.0–15.0)
Lymphocytes Relative: 32.6 % (ref 12.0–46.0)
Lymphs Abs: 2.6 10*3/uL (ref 0.7–4.0)
MCHC: 33.6 g/dL (ref 30.0–36.0)
MCV: 91.7 fl (ref 78.0–100.0)
Monocytes Absolute: 0.6 10*3/uL (ref 0.1–1.0)
Monocytes Relative: 7.3 % (ref 3.0–12.0)
Neutro Abs: 4.6 10*3/uL (ref 1.4–7.7)
Neutrophils Relative %: 58.5 % (ref 43.0–77.0)
Platelets: 252 10*3/uL (ref 150.0–400.0)
RBC: 4.61 Mil/uL (ref 3.87–5.11)
RDW: 13.4 % (ref 11.5–15.5)
WBC: 7.9 10*3/uL (ref 4.0–10.5)

## 2022-05-27 LAB — PULMONARY FUNCTION TEST
FEF 25-75 Post: 1.69 L/sec
FEF 25-75 Pre: 0.91 L/sec
FEF2575-%Change-Post: 85 %
FEF2575-%Pred-Post: 106 %
FEF2575-%Pred-Pre: 57 %
FEV1-%Change-Post: 19 %
FEV1-%Pred-Post: 77 %
FEV1-%Pred-Pre: 65 %
FEV1-Post: 1.43 L
FEV1-Pre: 1.2 L
FEV1FVC-%Change-Post: 0 %
FEV1FVC-%Pred-Pre: 99 %
FEV6-%Change-Post: 19 %
FEV6-%Pred-Post: 82 %
FEV6-%Pred-Pre: 68 %
FEV6-Post: 1.91 L
FEV6-Pre: 1.6 L
FEV6FVC-%Pred-Post: 105 %
FEV6FVC-%Pred-Pre: 105 %
FVC-%Change-Post: 19 %
FVC-%Pred-Post: 77 %
FVC-%Pred-Pre: 65 %
FVC-Post: 1.91 L
FVC-Pre: 1.6 L
Post FEV1/FVC ratio: 75 %
Post FEV6/FVC ratio: 100 %
Pre FEV1/FVC ratio: 75 %
Pre FEV6/FVC Ratio: 100 %

## 2022-05-27 MED ORDER — BUDESONIDE-FORMOTEROL FUMARATE 80-4.5 MCG/ACT IN AERO: 2.0000 | INHALATION_SPRAY | Freq: Two times a day (BID) | RESPIRATORY_TRACT | 5 refills | Status: DC

## 2022-05-27 NOTE — Progress Notes (Signed)
Pre/Post Spirometry Performed Today. 

## 2022-05-27 NOTE — Patient Instructions (Signed)
Pre/Post Spirometry Performed Today. 

## 2022-05-27 NOTE — Assessment & Plan Note (Signed)
Symptoms consistent with asthma; PFTs today with positive BD. Remains without a formal diagnosis of COPD (ratio 75). Will start her on ICS/LABA therapy. Given this and her allergy symptoms, will check allergen panel and CBC with diff to assess for allergic component and evaluate eosinophils. Continue PRN albuterol. Continue allegra for trigger prevention.  Patient Instructions  Continue Albuterol inhaler 2 puffs or 3 mL neb every 6 hours as needed for shortness of breath or wheezing. Notify if symptoms persist despite rescue inhaler/neb use. Continue allegra 180 mg  Continue flonase nasal spray 2 sprays each nostril daily  Start Symbicort 2 puffs Twice daily. Brush tongue and rinse mouth afterwards  Labs - CBC with diff, allergen panel  Follow up in 6 weeks with Dr. Elsworth Soho or Alanson Aly. If symptoms do not improve or worsen, please contact office for sooner follow up or seek emergency care.

## 2022-05-27 NOTE — Patient Instructions (Signed)
Continue Albuterol inhaler 2 puffs or 3 mL neb every 6 hours as needed for shortness of breath or wheezing. Notify if symptoms persist despite rescue inhaler/neb use. Continue allegra 180 mg  Continue flonase nasal spray 2 sprays each nostril daily  Start Symbicort 2 puffs Twice daily. Brush tongue and rinse mouth afterwards  Labs - CBC with diff, allergen panel  Follow up in 6 weeks with Dr. Elsworth Soho or Alanson Aly. If symptoms do not improve or worsen, please contact office for sooner follow up or seek emergency care.

## 2022-05-27 NOTE — Progress Notes (Addendum)
$'@Patient'b$  ID: Nichole Delgado, female    DOB: 20-Dec-1951, 71 y.o.   MRN: [id number]  Chief Complaint  Patient presents with   Follow-up    She has increased shortness of breath in the morning, and wheezing at night.     Referring provider: Vivi Barrack, MD  HPI: 71 year old female, active smoker followed for DOE, pulmonary nodule and chronic bronchitis.  She is a patient of Dr. Bari Mantis and last seen in office 11/05/2021.  Past medical history significant for hypertension, migraines, IBS, GERD, DDD, OA, HLD, insomnia, vertigo, complex regional pain syndrome after a fall and fractures of both ankles with right foot drop status post spinal cord stimulator.  TEST/EVENTS:  07/01/2020 CT chest without contrast: Stable groundglass opacity in the posterior left upper lobe measuring 1 x 0.7 cm; this has been documented as stable over 3 years and suggests a benign etiology.  There is a 3 mm nodular opacity in the right upper lobe which is also stable.  No acute airspace disease or new suspicious nodules.  There is atherosclerosis present.  There is a small pericardial effusion which is stable there is a small benign left adrenal adenoma which is also stable. 05/27/2022 PFTs: FVC 65, FEV1 65, ratio 75.  She did have a positive bronchodilator response (19%)  11/05/2021: OV with Dr. Elsworth Soho.  Previously seen by Dr. Vaughan Browner and 2019.  She had a groundglass opacity in the left upper lobe at the time.  CT imaging was stable and she was recommended for 2-year follow-up imaging.  At the time PFTs did not show any significant airway obstruction.  She was advised on smoking cessation.  At this OV, she reports shortness of breath over the last year.  Had COVID infection in October 2022.  Since then she has had at least 4 episodes where she has woken up gasping for breath in the middle of the night and early in the morning.  Has not occurred over the last 2 weeks.  She does also have occasional wheezing and cough with  clear sputum, especially in the morning.  Epworth score was 5.  Denied any daytime naps.  Did report some sluggishness in the morning before she gets going.  Recommended repeat spirometry to determine if lung function had worsened; if normal, would move forward with a home sleep study for evaluation of OSA.  Recommended to use albuterol on as-needed basis.  Pulmonary nodule in the left upper lobe appears stable since 2019 and likely very benign.  05/27/2022: Today-follow-up Patient presents today for overdue follow-up after pulmonary function testing.  She had to reschedule her testing previously so it has been around 6 months since we saw her last.  PFTs today showed some obstruction with reversibility (19% change postbronchodilator), consistent with asthma.  She reports that she continues to have nocturnal dyspnea, shortness of breath with activity throughout the day and occasional wheezing.  Her cough is stable and unchanged from her baseline.  Productive at times with clear sputum, mainly in the morning.  She also has occasional allergy symptoms with sneezing and nasal congestion/drainage.  She is on Allegra and Flonase for this.  She denies any hemoptysis, weight loss, fevers, night sweats, lower extremity swelling.  She does get some relief from albuterol use.  Has never been on a maintenance inhaler in the past.  She is still smoking daily.  Does know that she needs to quit but has no desire to at this point.  Allergies  Allergen Reactions  Penicillins Hives    Has patient had a PCN reaction causing immediate rash, facial/tongue/throat swelling, SOB or lightheadedness with hypotension: No Has patient had a PCN reaction causing severe rash involving mucus membranes or skin necrosis: Yes Has patient had a PCN reaction that required hospitalization No Has patient had a PCN reaction occurring within the last 10 years: No If all of the above answers are "NO", then may proceed with Cephalosporin use.     Sulfa Antibiotics Hives and Rash   Codeine Nausea Only   Hydrocodone Rash   Keppra [Levetiracetam] Rash   Other Other (See Comments)    States she has allergies to other seizure medications but doesn't know which ones   Topamax [Topiramate] Nausea Only    Weight loss    Immunization History  Administered Date(s) Administered   Fluad Quad(high Dose 65+) 11/26/2020   Influenza Split 09/24/2015, 10/09/2017, 09/24/2019   Influenza, High Dose Seasonal PF 09/26/2014, 09/19/2017   Influenza,inj,Quad PF,6-35 Mos 10/10/2018   Influenza-Unspecified 10/20/2021   PFIZER(Purple Top)SARS-COV-2 Vaccination 01/28/2020, 02/22/2020   Pfizer Covid-19 Vaccine Bivalent Booster 57yr & up 01/04/2022   Pneumococcal Polysaccharide-23 09/30/2001   Tdap 04/19/2008   Unspecified SARS-COV-2 Vaccination 01/28/2020, 02/22/2020, 10/20/2020   Zoster, Live 03/22/2008, 03/22/2012    Past Medical History:  Diagnosis Date   Cervical spondylosis    Complex regional pain syndrome I    followed by pain clinic, pcp,  has spinal cord stimulator   DDD (degenerative disc disease), lumbar    GAD (generalized anxiety disorder)    GERD (gastroesophageal reflux disease)    History of basal cell carcinoma (BCC) excision    per pt moh's sx on nose in 2020   History of chronic bronchitis    uses inhaler prn   Hypertension    followed by pcp and dr hilty   Irritable bowel syndrome with diarrhea    followed by dr mWatt Climes(gi)   Lesion of bladder    Migraines    neurology-- [facility] [place]  Mixed dyslipidemia    followed by lipid clinic--- dr hDebara Pickett  Neuropathy    OA (osteoarthritis)    PONV (postoperative nausea and vomiting)    severe   Pre-diabetes    S/P insertion of spinal 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   Stress incontinence    SUI (stress urinary incontinence, female)    Thyroid cyst    04-24-2021  pt stated recently dx and has been referred to endocrinologist   Vertigo    Wears  glasses     Tobacco History: Social History   Tobacco Use  Smoking Status Every Day   Packs/day: 1.00   Years: 45.00   Total pack years: 45.00   Types: Cigarettes  Smokeless Tobacco Never   Ready to quit: Not Answered Counseling given: Not Answered   Outpatient Medications Prior to Visit  Medication Sig Dispense Refill   albuterol (VENTOLIN HFA) 108 (90 Base) MCG/ACT inhaler Inhale 2 puffs into the lungs every 6 (six) hours as needed for wheezing or shortness of breath. 6.7 g 2   Azelaic Acid 15 % gel Apply topically daily. After skin is thoroughly washed and patted dry, gently but thoroughly massage a thin film of azelaic acid cream into the affected area twice daily, in the morning and evening.     Ca Phosphate-Cholecalciferol 250-500 MG-UNIT CHEW Chew 1 each by mouth 2 (two) times daily.     Calcium Carb-Cholecalciferol (CALCIUM 500 +D PO) Take 1 tablet by mouth  daily.     Cholecalciferol (VITAMIN D3) 50 MCG (2000 UT) TABS Take 1 tablet by mouth daily.     estradiol (CLIMARA - DOSED IN MG/24 HR) 0.1 mg/24hr patch Place 0.1 mg onto the skin 2 (two) times a week.     fluticasone (FLONASE) 50 MCG/ACT nasal spray Place into both nostrils daily.     ibuprofen (ADVIL,MOTRIN) 200 MG tablet Take 400 mg by mouth every 6 (six) hours as needed for mild pain.     loperamide (IMODIUM) 2 MG capsule Take 2 mg by mouth as needed.     losartan (COZAAR) 25 MG tablet Take 1 tablet by mouth once daily 90 tablet 0   meclizine (ANTIVERT) 25 MG tablet Take 25 mg by mouth 3 (three) times daily as needed for dizziness.     omeprazole (PRILOSEC) 20 MG capsule Take 1 capsule (20 mg total) by mouth 2 (two) times daily before a meal. (Patient taking differently: Take 20 mg by mouth 2 (two) times daily as needed.) 60 capsule 0   ondansetron (ZOFRAN) 8 MG tablet Take by mouth every 8 (eight) hours as needed for nausea or vomiting.     perphenazine (TRILAFON) 2 MG tablet Take 2 mg by mouth at bedtime.      pravastatin (PRAVACHOL) 40 MG tablet TAKE 1 TABLET BY MOUTH AT BEDTIME 90 tablet 0   traMADol (ULTRAM) 50 MG tablet Take by mouth every 6 (six) hours as needed.     zolpidem (AMBIEN) 10 MG tablet Take 10 mg by mouth at bedtime as needed for sleep.     azelastine (ASTELIN) 0.1 % nasal spray Place 2 sprays into both nostrils 2 (two) times daily. (Patient not taking: Reported on 02/15/2022) 30 mL 12   COVID-19 mRNA bivalent vaccine, Pfizer, (PFIZER COVID-19 VAC BIVALENT) injection Inject into the muscle. (Patient not taking: Reported on 05/27/2022) 0.3 mL 0   dicyclomine (BENTYL) 10 MG capsule Take 10 mg by mouth 3 (three) times daily as needed. (Patient not taking: Reported on 05/27/2022)     Eluxadoline (VIBERZI) 75 MG TABS Take 1 tablet by mouth 2 (two) times daily. (Patient not taking: Reported on 05/27/2022)     No facility-administered medications prior to visit.     Review of Systems:   Constitutional: No weight loss or gain, night sweats, fevers, chills, fatigue, or lassitude. HEENT: No headaches, difficulty swallowing, tooth/dental problems, or sore throat. No itching, ear ache +occasional nasal congestion/drainage, sneezing CV:  +PND. No chest pain, orthopnea, swelling in lower extremities, anasarca, palpitations, syncope Resp: +shortness of breath with exertion; occasional wheezing; AM productive cough. No excess mucus or change in color of mucus.  No hemoptysis. No chest wall deformity GI:  No heartburn, indigestion, abdominal pain, nausea, vomiting, diarrhea, change in bowel habits, loss of appetite, bloody stools.  GU: No dysuria, change in color of urine, urgency or frequency.  No flank pain, no hematuria  Skin: No rash, lesions, ulcerations MSK:  No joint pain or swelling.  No decreased range of motion.  No back pain. Neuro: +vertigo; chronic foot drop.  Psych: No depression or anxiety. Mood stable.     Physical Exam:  BP 110/70 (BP Location: Left Arm, Cuff Size: Normal)   Pulse  72   Temp 98.1 F (36.7 C) (Oral)   Ht 5' (1.524 m)   Wt 133 lb (60.3 kg)   SpO2 97%   BMI 25.97 kg/m   GEN: Pleasant, interactive, well-appearing; in no acute distress. HEENT:  Normocephalic  and atraumatic. PERRLA. Sclera white. Nasal turbinates pink, moist and patent bilaterally. No rhinorrhea present. Oropharynx pink and moist, without exudate or edema. No lesions, ulcerations, or postnasal drip.  NECK:  Supple w/ fair ROM. No JVD present. Normal carotid impulses w/o bruits. Thyroid symmetrical with no goiter or nodules palpated. No lymphadenopathy.   CV: RRR, no m/r/g, no peripheral edema. Pulses intact, +2 bilaterally. No cyanosis, pallor or clubbing. PULMONARY:  Unlabored, regular breathing. Diminished bilaterally A&P w/o wheezes/rales/rhonchi. No accessory muscle use. No dullness to percussion. GI: BS present and normoactive. Soft, non-tender to palpation. No organomegaly or masses detected. No CVA tenderness. MSK: No erythema, warmth or tenderness. Cap refil <2 sec all extrem. No deformities or joint swelling noted.  Neuro: A/Ox3. No focal deficits noted.   Skin: Warm, no lesions or rashe Psych: Normal affect and behavior. Judgement and thought content appropriate.     Lab Results:  CBC    Component Value Date/Time   WBC 7.9 05/27/2022 1435   RBC 4.61 05/27/2022 1435   HGB 14.2 05/27/2022 1435   HCT 42.2 05/27/2022 1435   PLT 252.0 05/27/2022 1435   MCV 91.7 05/27/2022 1435   MCH 30.9 08/04/2018 0647   MCHC 33.6 05/27/2022 1435   RDW 13.4 05/27/2022 1435   LYMPHSABS 2.6 05/27/2022 1435   MONOABS 0.6 05/27/2022 1435   EOSABS 0.1 05/27/2022 1435   BASOSABS 0.1 05/27/2022 1435    BMET    Component Value Date/Time   NA 138 06/12/2021 1427   NA 141 06/12/2020 0000   K 3.9 06/12/2021 1427   CL 103 06/12/2021 1427   CO2 27 06/12/2021 1427   GLUCOSE 95 06/12/2021 1427   BUN 12 06/12/2021 1427   BUN 13 06/12/2020 0000   CREATININE 0.97 06/12/2021 1427   CALCIUM  10.2 06/12/2021 1427   GFRNONAA 61 06/12/2020 0000   GFRAA 74 06/12/2020 0000    BNP No results found for: "BNP"   Imaging:  No results found.       Latest Ref Rng & Units 05/27/2022   12:51 PM 02/13/2018   12:51 PM  PFT Results  FVC-Pre L 1.60  2.00   FVC-Predicted Pre % 65  77   FVC-Post L 1.91  2.10   FVC-Predicted Post % 77  81   Pre FEV1/FVC % % 75  77   Post FEV1/FCV % % 75  78   FEV1-Pre L 1.20  1.54   FEV1-Predicted Pre % 65  79   FEV1-Post L 1.43  1.64   DLCO uncorrected ml/min/mmHg  15.41   DLCO UNC% %  81   DLVA Predicted %  111   TLC L  4.08   TLC % Predicted %  91   RV % Predicted %  115     Lab Results  Component Value Date   NITRICOXIDE 7 02/13/2018        Assessment & Plan:   Moderate persistent asthma Symptoms consistent with asthma; PFTs today with positive BD. Remains without a formal diagnosis of COPD (ratio 75). Will start her on ICS/LABA therapy. Given this and her allergy symptoms, will check allergen panel and CBC with diff to assess for allergic component and evaluate eosinophils. Continue PRN albuterol. Continue allegra for trigger prevention.  Patient Instructions  Continue Albuterol inhaler 2 puffs or 3 mL neb every 6 hours as needed for shortness of breath or wheezing. Notify if symptoms persist despite rescue inhaler/neb use. Continue allegra 180 mg  Continue flonase  nasal spray 2 sprays each nostril daily  Start Symbicort 2 puffs Twice daily. Brush tongue and rinse mouth afterwards  Labs - CBC with diff, allergen panel  Follow up in 6 weeks with Dr. Elsworth Soho or Alanson Aly. If symptoms do not improve or worsen, please contact office for sooner follow up or seek emergency care.    Tobacco abuse Heavy smoker; still smoking 1 ppd. Smoking cessation strongly advised; spent approximately 3-5 minutes discussing options for quitting and risks of continued smoking. Pt has no desire to quit at this point. We will continue to have  ongoing conversations about the adverse effects of smoking.  I spent 35 minutes of dedicated to the care of this patient on the date of this encounter to include pre-visit review of records, face-to-face time with the patient discussing conditions above, post visit ordering of testing, clinical documentation with the electronic health record, making appropriate referrals as documented, and communicating necessary findings to members of the patients care team.  Clayton Bibles, NP 06/18/2022  Pt aware and understands NP's role.

## 2022-05-27 NOTE — Assessment & Plan Note (Addendum)
Heavy smoker; still smoking 1 ppd. Smoking cessation strongly advised; spent approximately 3-5 minutes discussing options for quitting and risks of continued smoking. Pt has no desire to quit at this point. We will continue to have ongoing conversations about the adverse effects of smoking.

## 2022-05-31 ENCOUNTER — Telehealth: Payer: Self-pay | Admitting: Nurse Practitioner

## 2022-05-31 LAB — ALLERGEN PANEL (27) + IGE
Alternaria Alternata IgE: <0.1 kU/L
Aspergillus Fumigatus IgE: <0.1 kU/L
Bahia Grass IgE: <0.1 kU/L
Bermuda Grass IgE: <0.1 kU/L
Cat Dander IgE: <0.1 kU/L
Cedar, Mountain IgE: <0.1 kU/L
Cladosporium Herbarum IgE: <0.1 kU/L
Cocklebur IgE: <0.1 kU/L
Cockroach, American IgE: <0.1 kU/L
Common Silver Birch IgE: <0.1 kU/L
D Farinae IgE: 0.44 kU/L — AB
D Pteronyssinus IgE: 0.43 kU/L — AB
Dog Dander IgE: <0.1 kU/L
Elm, American IgE: <0.1 kU/L
Hickory, White IgE: 0.24 kU/L — AB
IgE (Immunoglobulin E), Serum: 29 IU/mL (ref 6–495)
Johnson Grass IgE: <0.1 kU/L
Kentucky Bluegrass IgE: <0.1 kU/L
Maple/Box Elder IgE: <0.1 kU/L
Mucor Racemosus IgE: <0.1 kU/L
Oak, White IgE: <0.1 kU/L
Penicillium Chrysogen IgE: <0.1 kU/L
Pigweed, Rough IgE: <0.1 kU/L
Plantain, English IgE: <0.1 kU/L
Ragweed, Short IgE: <0.1 kU/L
Setomelanomma Rostrat: <0.1 kU/L
Timothy Grass IgE: <0.1 kU/L
White Mulberry IgE: <0.1 kU/L

## 2022-05-31 NOTE — Progress Notes (Signed)
Please notify patient that her allergen panel showed she is allergic to dust and low response to white hickory (type of tree). Recommend she continue the allergra she is on. If she is still poorly controlled on the Symbicort, we may add on singulair but we will evaluate how she is at her follow up and go from there. Thanks!

## 2022-05-31 NOTE — Telephone Encounter (Signed)
Patient called her insurance company and has to meet her deductible and then she can afford the medication. Patient requested to cancel this message- she is going to pick up the medication.

## 2022-06-02 ENCOUNTER — Telehealth: Payer: Self-pay | Admitting: Nurse Practitioner

## 2022-06-02 NOTE — Telephone Encounter (Signed)
Called and spoke with patient. She verbalized understanding and will call us back after switching to just 1 puff twice daily to report her symptoms.   Nothing further needed at time of call.

## 2022-06-02 NOTE — Telephone Encounter (Signed)
Called and spoke with patient. Nichole Delgado stated that Nichole Delgado started the Symbicort 77mg inhaler yesterday. Confirmed that Nichole Delgado used only 2 puffs in the morning and 2 puffs last night. Immediately after using the inhaler Nichole Delgado began to feel extremely nervous and jittery. These sensations lasted for about an hour and then Nichole Delgado left like herself again. Nichole Delgado denied having a sore throat or feeling like her throat was about to close. Also denied having any hives or increased SOB.   Nichole Delgado also stated that Nichole Delgado had a similar episode a few months ago when Nichole Delgado was prescribed a medrol dosepak on the first day.   I advised her to not use the Symbicort this morning until [place]ole Delgado [place] Del[place] verbalized understanding.   Nichole Delgado wants to know if it is safe for her to remain on the Symbicort.   KJoellen Jersey can you please advise? Thanks!

## 2022-06-02 NOTE — Telephone Encounter (Signed)
She can try 1 puff Twice daily and see if this is better tolerated. Please keep [place] updated. Thanks!

## 2022-06-04 ENCOUNTER — Telehealth: Payer: Self-pay | Admitting: Family Medicine

## 2022-06-04 NOTE — Telephone Encounter (Signed)
Patient requests a Referral be sent to  [facility] asap to see Dr. Hilarie Fredrickson for IBS with diarrhea (Patient states she has had for 35 years). Patient requests to be called at ph# [phone] with status of Referral being sent/appointment.

## 2022-06-07 NOTE — Telephone Encounter (Signed)
Patient need OV for referral  

## 2022-06-09 ENCOUNTER — Encounter: Payer: Self-pay | Admitting: Physician Assistant

## 2022-06-09 ENCOUNTER — Ambulatory Visit (INDEPENDENT_AMBULATORY_CARE_PROVIDER_SITE_OTHER): Payer: Medicare Other | Admitting: Physician Assistant

## 2022-06-09 VITALS — BP 140/88 | HR 71 | Temp 98.1°F | Ht 60.0 in | Wt 131.0 lb

## 2022-06-09 DIAGNOSIS — J42 Unspecified chronic bronchitis: Secondary | ICD-10-CM | POA: Diagnosis not present

## 2022-06-09 DIAGNOSIS — K580 Irritable bowel syndrome with diarrhea: Secondary | ICD-10-CM

## 2022-06-09 NOTE — Patient Instructions (Signed)
It was great to see you!  We will place referral for you!  If a referral was placed today --> you will be contacted for an appointment. Please note that routine referrals can sometimes take up to 3-4 weeks to process. Please call our office if you haven't heard anything after this time frame.  If blood work, urine studies, or any imaging was ordered today -->  we will release your results to you on your MyChart account (if you have chosen to sign up for this) with further instructions. You may see the results before I do, but when I review them I will send you a message with my report or have my staff call you if things need to be discussed. Please reply to my message with any questions.   Take care,  Inda Coke PA-C

## 2022-06-09 NOTE — Progress Notes (Signed)
Nichole Delgado is a 71 y.o. female here for a follow up on IBS.  History of Present Illness:   Chief Complaint  Patient presents with   Irritable Bowel Syndrome    Pt c/o rectal pain and stomach cramps and bowel spasms. She is moving bowels 3-4 times a day. Pt has not     HPI  Irritable Bowel Syndrome  Patient presents with c/o of rectal pain and abdominal cramps. States she has been having BM 3-4 times a day with bowel spasms. Per pt, she has taking Viberzi for past 4-5 years. However she states she has not taken this for 5 months due to refill issue. States she has not been receiving her medication since January. She has been taking Imodium with some relief. She was following with Dr. Watt Climes for this issue for the past several years. Her symptoms were well managed. She is requesting referral to new GI at this time. Denies new rectal bleeding.  Chronic bronchitis Patient states that this is a new dx for her. Seeing pulmonology. She has been using Symbicort BID for this -- she felt like 2 puffs was too much so she is currently using 1 puff twice daily. Her symptoms are manageable at this time. Denies severe cough or wheezing. Denies need for consistent albuterol use.  Past Medical History:  Diagnosis Date   Cervical spondylosis    Complex regional pain syndrome I    followed by pain clinic, pcp,  has spinal cord stimulator   DDD (degenerative disc disease), lumbar    GAD (generalized anxiety disorder)    GERD (gastroesophageal reflux disease)    History of basal cell carcinoma (BCC) excision    per pt moh's sx on nose in 2020   History of chronic bronchitis    uses inhaler prn   Hypertension    followed by pcp and dr hilty   Irritable bowel syndrome with diarrhea    followed by dr Watt Climes (gi)   Lesion of bladder    Migraines    neurology-- [facility] [place]   Mixed dyslipidemia    followed by lipid clinic--- dr Debara Pickett   Neuropathy    OA (osteoarthritis)     PONV (postoperative nausea and vomiting)    severe   Pre-diabetes    S/P insertion of spinal 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   Stress incontinence    SUI (stress urinary incontinence, female)    Thyroid cyst    04-24-2021  pt stated recently dx and has been referred to endocrinologist   Vertigo    Wears glasses      Social History   Tobacco Use   Smoking status: Every Day    Packs/day: 1.00    Years: 45.00    Total pack years: 45.00    Types: Cigarettes   Smokeless tobacco: Never  Vaping Use   Vaping Use: Never used  Substance Use Topics   Alcohol use: Not Currently    Alcohol/week: 0.0 standard drinks of alcohol   Drug use: Never    Past Surgical History:  Procedure Laterality Date   BACK SURGERY     BLADDER SURGERY  05/12/2021   for stress incontinence per pt   CATARACT EXTRACTION W/ INTRAOCULAR LENS  IMPLANT, BILATERAL  2005   COLONOSCOPY W/ POLYPECTOMY     CYSTOSCOPY WITH BIOPSY N/A 05/12/2021   Procedure: CYSTOSCOPY WITH BIOPSY AND FULGERATION;  Surgeon: Robley Fries, MD;  Location: [facility];  Service: Urology;  Laterality:  N/A;  30 MINS   FOOT SURGERY Right 2011   hammer toe   ORIF ANKLE FRACTURE Left 03/27/2017   Procedure: OPEN REDUCTION INTERNAL FIXATION (ORIF) BIMALLEOLAR ANKLE FRACTURE;  Surgeon: Renette Butters, MD;  Location: [facility];  Service: Orthopedics;  Laterality: Left;   ORIF CALCANEOUS FRACTURE Right 04/05/2017   Procedure: OPEN REDUCTION INTERNAL FIXATION (ORIF) CALCANEOUS AND FIBULA FRACTURE;  Surgeon: Renette Butters, MD;  Location: [facility];  Service: Orthopedics;  Laterality: Right;   POSTERIOR LUMBAR FUSION  09-08-2020  '@[facility]'$    L5--S1   SPINAL CORD STIMULATOR INSERTION  08/15/2018   SUPRACERVICAL ABDOMINAL HYSTERECTOMY  1988   TONSILLECTOMY AND ADENOIDECTOMY  child    Family History  Problem Relation Age of Onset   Heart attack Father    Migraines Mother    Cancer Sister        Pancreatic   Cancer Brother         Liver   Migraines Daughter    Breast cancer Neg Hx     Allergies  Allergen Reactions   Penicillins Hives    Has patient had a PCN reaction causing immediate rash, facial/tongue/throat swelling, SOB or lightheadedness with hypotension: No Has patient had a PCN reaction causing severe rash involving mucus membranes or skin necrosis: Yes Has patient had a PCN reaction that required hospitalization No Has patient had a PCN reaction occurring within the last 10 years: No If all of the above answers are "NO", then may proceed with Cephalosporin use.    Sulfa Antibiotics Hives and Rash   Codeine Nausea Only   Hydrocodone Rash   Keppra [Levetiracetam] Rash   Other Other (See Comments)    States she has allergies to other seizure medications but doesn't know which ones   Topamax [Topiramate] Nausea Only    Weight loss    Current Medications:   Current Outpatient Medications:    albuterol (VENTOLIN HFA) 108 (90 Base) MCG/ACT inhaler, Inhale 2 puffs into the lungs every 6 (six) hours as needed for wheezing or shortness of breath., Disp: 6.7 g, Rfl: 2   Azelaic Acid 15 % gel, Apply topically daily. After skin is thoroughly washed and patted dry, gently but thoroughly massage a thin film of azelaic acid cream into the affected area twice daily, in the morning and evening., Disp: , Rfl:    budesonide-formoterol (SYMBICORT) 80-4.5 MCG/ACT inhaler, Inhale 2 puffs into the lungs 2 (two) times daily., Disp: 1 each, Rfl: 5   Ca Phosphate-Cholecalciferol 250-500 MG-UNIT CHEW, Chew 1 each by mouth 2 (two) times daily., Disp: , Rfl:    Calcium Carb-Cholecalciferol (CALCIUM 500 +D PO), Take 1 tablet by mouth daily., Disp: , Rfl:    Cholecalciferol (VITAMIN D3) 50 MCG (2000 UT) TABS, Take 1 tablet by mouth daily., Disp: , Rfl:    estradiol (CLIMARA - DOSED IN MG/24 HR) 0.1 mg/24hr patch, Place 0.1 mg onto the skin 2 (two) times a week., Disp: , Rfl:    fluticasone (FLONASE) 50 MCG/ACT nasal spray, Place  into both nostrils daily., Disp: , Rfl:    ibuprofen (ADVIL,MOTRIN) 200 MG tablet, Take 400 mg by mouth every 6 (six) hours as needed for mild pain., Disp: , Rfl:    loperamide (IMODIUM) 2 MG capsule, Take 2 mg by mouth as needed., Disp: , Rfl:    losartan (COZAAR) 25 MG tablet, Take 1 tablet by mouth once daily, Disp: 90 tablet, Rfl: 0   meclizine (ANTIVERT) 25 MG tablet, Take 25 mg  by mouth 3 (three) times daily as needed for dizziness., Disp: , Rfl:    omeprazole (PRILOSEC) 20 MG capsule, Take 1 capsule (20 mg total) by mouth 2 (two) times daily before a meal. (Patient taking differently: Take 20 mg by mouth 2 (two) times daily as needed.), Disp: 60 capsule, Rfl: 0   ondansetron (ZOFRAN) 8 MG tablet, Take by mouth every 8 (eight) hours as needed for nausea or vomiting., Disp: , Rfl:    perphenazine (TRILAFON) 2 MG tablet, Take 2 mg by mouth at bedtime., Disp: , Rfl:    pravastatin (PRAVACHOL) 40 MG tablet, TAKE 1 TABLET BY MOUTH AT BEDTIME, Disp: 90 tablet, Rfl: 0   traMADol (ULTRAM) 50 MG tablet, Take by mouth every 6 (six) hours as needed., Disp: , Rfl:    zolpidem (AMBIEN) 10 MG tablet, Take 10 mg by mouth at bedtime as needed for sleep., Disp: , Rfl:    Eluxadoline (VIBERZI) 75 MG TABS, Take 1 tablet by mouth 2 (two) times daily., Disp: , Rfl:    Review of Systems:   ROS Negative unless otherwise specified per HPI.   Vitals:   Vitals:   06/09/22 1404  BP: 140/88  Pulse: 71  Temp: 98.1 F (36.7 C)  TempSrc: Temporal  SpO2: 97%  Weight: 131 lb (59.4 kg)  Height: 5' (1.524 m)     Body mass index is 25.58 kg/m.  Physical Exam:   Physical Exam Vitals and nursing note reviewed.  Constitutional:      General: She is not in acute distress.    Appearance: She is well-developed. She is not ill-appearing or toxic-appearing.  Cardiovascular:     Rate and Rhythm: Normal rate and regular rhythm.     Pulses: Normal pulses.     Heart sounds: Normal heart sounds, S1 normal and S2  normal.  Pulmonary:     Effort: Pulmonary effort is normal.     Breath sounds: Normal breath sounds.  Skin:    General: Skin is warm and dry.  Neurological:     Mental Status: She is alert.     GCS: GCS eye subscore is 4. GCS verbal subscore is 5. GCS motor subscore is 6.  Psychiatric:        Speech: Speech normal.        Behavior: Behavior normal. Behavior is cooperative.     Assessment and Plan:   Irritable bowel syndrome with diarrhea Uncontrolled Will refer to [facility] -- she is requesting Dr. Hilarie Fredrickson Follow-up as needed with [place] or [place] until appt if any new issues/concerns arise  Chronic bronchitis, unspecified chronic bronchitis type ([place]) Well controlled per patient Continue symbicort 1 puff BID and albuterol prn Follow-up with pulm as ordered  I,Savera Zaman,acting as a scribe for [employer], PA.,have documented all relevant documentation on the behalf of Inda Coke, PA,as directed by  Inda Coke, PA while in the presence of Inda Coke, [place].   I, Inda Coke, [place], have reviewed all documentation for this visit. The documentation on 06/09/22 for the exam, diagnosis, procedures, and orders are all accurate and complete.   Inda Coke, PA-C

## 2022-06-11 DIAGNOSIS — M19071 Primary osteoarthritis, right ankle and foot: Secondary | ICD-10-CM | POA: Diagnosis not present

## 2022-06-16 ENCOUNTER — Other Ambulatory Visit: Payer: Self-pay | Admitting: Orthopaedic Surgery

## 2022-06-16 ENCOUNTER — Ambulatory Visit (INDEPENDENT_AMBULATORY_CARE_PROVIDER_SITE_OTHER): Payer: Medicare Other | Admitting: Internal Medicine

## 2022-06-16 ENCOUNTER — Encounter: Payer: Self-pay | Admitting: Internal Medicine

## 2022-06-16 VITALS — BP 124/78 | HR 93 | Ht 60.0 in | Wt 132.8 lb

## 2022-06-16 DIAGNOSIS — M25571 Pain in right ankle and joints of right foot: Secondary | ICD-10-CM

## 2022-06-16 DIAGNOSIS — E042 Nontoxic multinodular goiter: Secondary | ICD-10-CM

## 2022-06-16 LAB — T4, FREE: Free T4: 0.85 ng/dL (ref 0.60–1.60)

## 2022-06-16 LAB — TSH: TSH: 1.58 u[IU]/mL (ref 0.35–5.50)
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
## 2022-06-21 DIAGNOSIS — M79672 Pain in left foot: Secondary | ICD-10-CM | POA: Diagnosis not present

## 2022-06-21 DIAGNOSIS — Z79891 Long term (current) use of opiate analgesic: Secondary | ICD-10-CM | POA: Diagnosis not present

## 2022-06-21 DIAGNOSIS — M79671 Pain in right foot: Secondary | ICD-10-CM | POA: Diagnosis not present

## 2022-06-24 ENCOUNTER — Telehealth: Payer: Self-pay | Admitting: Nurse Practitioner

## 2022-06-24 DIAGNOSIS — J4540 Moderate persistent asthma, uncomplicated: Secondary | ICD-10-CM

## 2022-06-24 NOTE — Telephone Encounter (Signed)
Please advise her to check COVID test first. If negative, we can try switching her to a different ICS/LABA inhaler. Thanks.

## 2022-06-24 NOTE — Telephone Encounter (Signed)
Called and spoke with patient who states she believes she is having a reaction to symbicort. Head stuffed up, coughing a little and SOB. Pt has not taken covid test. Denies fever but her taste is off. States symbicort can give that reaction and those symptoms are listed as side effects. Has not used inhaler this morning or last night and is feeling better.     Please advise.

## 2022-06-24 NOTE — Telephone Encounter (Signed)
Called and spoke with patient to let her know the recs from [place]. Advised her to do covid test and call us back with the results. She expressed understanding. Will wait to hear from patient

## 2022-06-25 ENCOUNTER — Telehealth: Payer: Self-pay | Admitting: Nurse Practitioner

## 2022-06-25 ENCOUNTER — Telehealth: Payer: Self-pay | Admitting: Internal Medicine

## 2022-06-25 MED ORDER — FLUTICASONE-SALMETEROL 115-21 MCG/ACT IN AERO: 2.0000 | INHALATION_SPRAY | Freq: Two times a day (BID) | RESPIRATORY_TRACT | 12 refills | Status: DC

## 2022-06-25 NOTE — Telephone Encounter (Signed)
Pt is negative for covid.   Routing back to [place] for advice on inhaler as mentioned below Thanks!  Allergies  Allergen Reactions   Penicillins Hives    Has patient had a PCN reaction causing immediate rash, facial/tongue/throat swelling, SOB or lightheadedness with hypotension: No Has patient had a PCN reaction causing severe rash involving mucus membranes or skin necrosis: Yes Has patient had a PCN reaction that required hospitalization No Has patient had a PCN reaction occurring within the last 10 years: No If all of the above answers are "NO", then may proceed with Cephalosporin use.    Sulfa Antibiotics Hives and Rash   Codeine Nausea Only   Hydrocodone Rash   Keppra [Levetiracetam] Rash   Other Other (See Comments)    States she has allergies to other seizure medications but doesn't know which ones   Topamax [Topiramate] Nausea Only    Weight loss

## 2022-06-25 NOTE — Telephone Encounter (Signed)
I called and spoke with the pt and notified of response per [facility]. The advair has already been sent. Nothing further needed.

## 2022-06-25 NOTE — Telephone Encounter (Signed)
Called and spoke with pt stating to her that we did not have any samples of Advair. Stated to her that I was going to send this to our prior auth team in regards to her stating that it is requiring a PA and she verbalized understanding. Routing to prior [place] team.

## 2022-06-25 NOTE — Telephone Encounter (Signed)
Stop Symbicort. Trial change to Advair 2 puffs Twice daily. Brush tongue and rinse mouth afterwards. She can use OTC delsym 2 tsp Twice daily as needed for cough and mucinex 600 mg Twice daily for congestion. Thanks.

## 2022-06-28 ENCOUNTER — Other Ambulatory Visit (HOSPITAL_COMMUNITY): Payer: Self-pay

## 2022-06-28 NOTE — Telephone Encounter (Signed)
Routing to [employer]. Please advise on this.

## 2022-06-28 NOTE — Telephone Encounter (Signed)
Are those the most affordable options available for her aside from Symbicort (prior reaction to this)? She needs ICS/LABA inhaler; not Stiolto or Spiriva.

## 2022-06-28 NOTE — Telephone Encounter (Signed)
ok 

## 2022-06-28 NOTE — Telephone Encounter (Signed)
Per test claim, Advair HFA doesn't require a pa, but it's $260.75. Generic Wixela or Advair is $114.17 but said to try Spiriva/Stiolto, Spiriva is $193.81, Stiolto is [phone]

## 2022-07-01 ENCOUNTER — Encounter: Payer: Self-pay | Admitting: Internal Medicine

## 2022-07-01 NOTE — Telephone Encounter (Signed)
Scheduled w-pt for next available 09-07-22 at 1:50pm. New patient paperwork sent.

## 2022-07-02 ENCOUNTER — Telehealth: Payer: Self-pay | Admitting: Nurse Practitioner

## 2022-07-02 ENCOUNTER — Ambulatory Visit
Admission: RE | Admit: 2022-07-02 | Discharge: 2022-07-02 | Disposition: A | Discharge status: Home / Self Care | Payer: Medicare Other | Source: Ambulatory Visit | Attending: Orthopaedic Surgery | Admitting: Orthopaedic Surgery

## 2022-07-02 ENCOUNTER — Other Ambulatory Visit: Payer: Self-pay | Admitting: Nurse Practitioner

## 2022-07-02 DIAGNOSIS — S92001A Unspecified fracture of right calcaneus, initial encounter for closed fracture: Secondary | ICD-10-CM | POA: Diagnosis not present

## 2022-07-02 DIAGNOSIS — Z9889 Other specified postprocedural states: Secondary | ICD-10-CM | POA: Diagnosis not present

## 2022-07-02 DIAGNOSIS — M19071 Primary osteoarthritis, right ankle and foot: Secondary | ICD-10-CM | POA: Diagnosis not present

## 2022-07-02 DIAGNOSIS — M25571 Pain in right ankle and joints of right foot: Secondary | ICD-10-CM

## 2022-07-02 DIAGNOSIS — J4541 Moderate persistent asthma with (acute) exacerbation: Secondary | ICD-10-CM

## 2022-07-02 MED ORDER — PREDNISONE 20 MG PO TABS: 40.0000 mg | ORAL_TABLET | Freq: Every day | ORAL | 0 refills | Status: AC

## 2022-07-02 NOTE — Telephone Encounter (Signed)
Possibly related to being off maintenance inhaler. I will send her in a prednisone burst 40 mg x 5days. She needs OV next week and go to UC or ED if symptoms worsen over the weekend.   Pharmacy team never got back to me for ICS/LABA options for her and Advair was too expensive. They provided info on generic option which was still over $100 but I didn't know if we had any other, more affordable options. Can you please send another message to them to get [place] cost breakdown for ICS/LABA inhalers? She is unable to use Symbicort. Thanks.

## 2022-07-02 NOTE — Telephone Encounter (Signed)
Ladies,  Per [employer] never got back to me for ICS/LABA options for her and Advair was too expensive. They provided info on generic option which was still over $100 but I didn't know if we had any other, more affordable options. Can you please send another message to them to get [place] cost breakdown for ICS/LABA inhalers? She is unable to use Symbicort. Thanks.

## 2022-07-02 NOTE — Telephone Encounter (Signed)
Called patient and she states that she has been having SOB. Patient is taking mucinex but not seeing a difference. Patient states she had some side effects from the Symbicort. She states that she has been having to use her Albuterol freq. She states she uses it about every 4hours because of her SOB.  Please advise Joellen [place]

## 2022-07-06 ENCOUNTER — Other Ambulatory Visit (HOSPITAL_COMMUNITY): Payer: Self-pay

## 2022-07-06 MED ORDER — FLUTICASONE-SALMETEROL 115-21 MCG/ACT IN AERO: 2.0000 | INHALATION_SPRAY | Freq: Two times a day (BID) | RESPIRATORY_TRACT | 12 refills | Status: DC

## 2022-07-06 NOTE — Telephone Encounter (Signed)
Called and spoke to patient about medication. And she agreed to the Advair 2 puffs 2 times a day.I advised patient that she can go on good rx for discounts hopefully. Patient verbalized understanding. Nothing further  needed

## 2022-07-06 NOTE — Telephone Encounter (Signed)
Please call patient and notify her of the above report. We can start her on generic advair 115 mcg 2 puffs Twice daily. She can look on GoodRx for discount cards to see if she can find it more affordable until she hits her deductible. Thanks.

## 2022-07-06 NOTE — Telephone Encounter (Signed)
Katie:   Good morning! Yes the most affordable inhaler is the Generic Wixela or Advair is $114.17, Dulera and Memory Dance are both requiring a prior [place]. Patient has a deductible that has to be met on the plan.

## 2022-07-07 DIAGNOSIS — G894 Chronic pain syndrome: Secondary | ICD-10-CM | POA: Diagnosis not present

## 2022-07-07 DIAGNOSIS — M4327 Fusion of spine, lumbosacral region: Secondary | ICD-10-CM | POA: Diagnosis not present

## 2022-07-07 DIAGNOSIS — M792 Neuralgia and neuritis, unspecified: Secondary | ICD-10-CM | POA: Diagnosis not present

## 2022-07-07 DIAGNOSIS — Z981 Arthrodesis status: Secondary | ICD-10-CM | POA: Diagnosis not present

## 2022-07-07 NOTE — Telephone Encounter (Signed)
Please see telephone encounter from 7/14. Appears that April spoke with her and she is going to move forward with Advair. Nothing further. Thanks!

## 2022-07-07 NOTE — Telephone Encounter (Signed)
Info from prior auth team: Remo Lipps  You 22 hours ago (11:21 AM)    Good morning! Yes the most affordable inhaler is the Generic Wixela or Advair is $114.17, Dulera and Memory Dance are both requiring a prior [place]. Patient has a deductible that has to be met on the plan.      Routing this info to [place] for her to review.

## 2022-07-14 DIAGNOSIS — M79671 Pain in right foot: Secondary | ICD-10-CM | POA: Diagnosis not present

## 2022-07-14 DIAGNOSIS — M25571 Pain in right ankle and joints of right foot: Secondary | ICD-10-CM | POA: Diagnosis not present

## 2022-07-15 ENCOUNTER — Telehealth: Payer: Self-pay | Admitting: *Deleted

## 2022-07-15 NOTE — Telephone Encounter (Signed)
Patient is calling for the name of a compound cream for neuropathy that was prescribed 1 year ago,explained to patient that she would need to scheduled an appointment since it has been a while since visit, understood  but did not wish to schedule at this time.

## 2022-07-19 ENCOUNTER — Ambulatory Visit: Payer: Medicare Other | Admitting: Pulmonary Disease

## 2022-07-21 ENCOUNTER — Encounter: Payer: Self-pay | Admitting: Pulmonary Disease

## 2022-07-21 ENCOUNTER — Ambulatory Visit (INDEPENDENT_AMBULATORY_CARE_PROVIDER_SITE_OTHER): Payer: Medicare Other | Admitting: Pulmonary Disease

## 2022-07-21 DIAGNOSIS — Z720 Tobacco use: Secondary | ICD-10-CM

## 2022-07-21 DIAGNOSIS — J4540 Moderate persistent asthma, uncomplicated: Secondary | ICD-10-CM

## 2022-07-21 NOTE — Progress Notes (Signed)
   Subjective:    Patient ID: Nichole Delgado, female    DOB: 1951/09/29, 71 y.o.   MRN: [id number]  HPI  71 yo active smoker followed for DOE, pulmonary nodule and chronic bronchitis vs asthma   She continues to smoke about a pack per day, more than 50 pack years     PMH -complex regional pain syndrome after a fall and fractures of both ankles with right foot drop status post spinal cord stimulator, -IBS with diarrhea  Based on bronchodilator response on PFTs, she was started on Symbicort with a diagnosis of asthma.  However this caused her heart to race and she was switched to Advair.  She has been using this for 2 weeks not sure if this is helping much. Albuterol seems to help her better. She also wonders if caffeine could be making her heart race. She continues to smoke about 1 pack/day. She is planning surgery on her leg for removal of hardware.  This may be the cause of her chronic pain. Her husband was a heavy smoker and [facility]  Significant tests/ events reviewed  CT chest 04/2017-10 x 7 mm groundglass opacity in the left upper lobe CT chest 11/2017- stable 10 x 7 mm groundglass opacity in the left upper lobe.  Improvement in small nodular opacities on the right.  CT chest 06/2020 stable 10 x 7 mm left upper lobe nodule   PFTs  05/27/2022 : FVC 65, FEV1 65, ratio 75.  positive bronchodilator response (19%)  01/2018 FVC 2.10 [81%], FEV1 1.64 [84%], F/F 78, TLC 91%, RV/TLC 128%, DLCO 81% Minimal obstruction, small airways disease   FENO 02/13/18-7  Review of Systems neg for any significant sore throat, dysphagia, itching, sneezing, nasal congestion or excess/ purulent secretions, fever, chills, sweats, unintended wt loss, pleuritic or exertional cp, hempoptysis, orthopnea pnd or change in chronic leg swelling. Also denies presyncope, palpitations, heartburn, abdominal pain, nausea, vomiting, diarrhea or change in bowel or urinary habits, dysuria,hematuria,  rash, arthralgias, visual complaints, headache, numbness weakness or ataxia.     Objective:   Physical Exam   Gen. Pleasant, well-nourished, in no distress ENT - no thrush, no pallor/icterus,no post nasal drip Neck: No JVD, no thyromegaly, no carotid bruits Lungs: Kyphosis, no use of accessory muscles, no dullness to percussion, clear without rales or rhonchi  Cardiovascular: Rhythm regular, heart sounds  normal, no murmurs or gallops, no peripheral edema Musculoskeletal: No deformities, no cyanosis or clubbing       Assessment & Plan:   Preop evaluation -she was cleared for surgery.  She will need oxygen monitoring perioperatively

## 2022-07-21 NOTE — Patient Instructions (Addendum)
Good luck with your surgery  Continue on advair  Please try to QUIT smoking !

## 2022-07-21 NOTE — Assessment & Plan Note (Signed)
Smoking cessation was again discussed is the most important intervention that would add years to her life.  She has quit for short periods in the past We discussed dangers of vaping She was not ready to set a quit date

## 2022-07-21 NOTE — Assessment & Plan Note (Signed)
Being treated as asthma due to bronchodilator response on PFTs however seems to be more related to chronic bronchitis from smoking. She will continue Advair for now as a trial for 2 to 3 months to see if she has a good response. We discussed signs and symptoms of COPD flare. She will use albuterol as needed for rescue

## 2022-07-26 ENCOUNTER — Telehealth: Payer: Self-pay | Admitting: Pulmonary Disease

## 2022-07-26 NOTE — Telephone Encounter (Signed)
Spoke with the pt  She states that Advair causes her to "have jitters"- when would take 2 puffs  She has tried to drop down to 1 puff daily and still does not do well with this  She has been coughing more- prod with clear sputum and also has had cold chills  She feels these are side effects from advair  Denies any other symptoms  Please advise, thanks!  Allergies  Allergen Reactions   Penicillins Hives    Has patient had a PCN reaction causing immediate rash, facial/tongue/throat swelling, SOB or lightheadedness with hypotension: No Has patient had a PCN reaction causing severe rash involving mucus membranes or skin necrosis: Yes Has patient had a PCN reaction that required hospitalization No Has patient had a PCN reaction occurring within the last 10 years: No If all of the above answers are "NO", then may proceed with Cephalosporin use.    Sulfa Antibiotics Hives and Rash   Codeine Nausea Only   Hydrocodone Rash   Keppra [Levetiracetam] Rash   Other Other (See Comments)    States she has allergies to other seizure medications but doesn't know which ones   Topamax [Topiramate] Nausea Only    Weight loss

## 2022-07-27 ENCOUNTER — Other Ambulatory Visit: Payer: Self-pay

## 2022-07-27 DIAGNOSIS — E782 Mixed hyperlipidemia: Secondary | ICD-10-CM | POA: Diagnosis not present

## 2022-07-27 DIAGNOSIS — F17210 Nicotine dependence, cigarettes, uncomplicated: Secondary | ICD-10-CM

## 2022-07-27 DIAGNOSIS — Z122 Encounter for screening for malignant neoplasm of respiratory organs: Secondary | ICD-10-CM

## 2022-07-27 DIAGNOSIS — Z87891 Personal history of nicotine dependence: Secondary | ICD-10-CM

## 2022-07-27 NOTE — Telephone Encounter (Signed)
Called the pt and there was no answer- LMTCB    

## 2022-07-28 LAB — LIPID PANEL
Chol/HDL Ratio: 2.1 ratio (ref 0.0–4.4)
Cholesterol, Total: 171 mg/dL (ref 100–199)
HDL: 80 mg/dL (ref >39)
LDL Chol Calc (NIH): 73 mg/dL (ref 0–99)
Triglycerides: 100 mg/dL (ref 0–149)
VLDL Cholesterol Cal: 18 mg/dL (ref 5–40)

## 2022-07-28 NOTE — Telephone Encounter (Signed)
Called and spoke with patient, advised of recommendations per Dr. Elsworth Soho.  She verbalized understanding and she will stop the Advair and use the Albuterol as needed.  Nothing further needed.

## 2022-07-30 ENCOUNTER — Ambulatory Visit (INDEPENDENT_AMBULATORY_CARE_PROVIDER_SITE_OTHER): Payer: Medicare Other | Admitting: Internal Medicine

## 2022-07-30 ENCOUNTER — Encounter (HOSPITAL_BASED_OUTPATIENT_CLINIC_OR_DEPARTMENT_OTHER): Payer: Self-pay | Admitting: Internal Medicine

## 2022-07-30 VITALS — BP 112/64 | HR 68 | Ht <= 58 in | Wt 133.0 lb

## 2022-07-30 DIAGNOSIS — R079 Chest pain, unspecified: Secondary | ICD-10-CM

## 2022-07-30 DIAGNOSIS — Z01810 Encounter for preprocedural cardiovascular examination: Secondary | ICD-10-CM | POA: Diagnosis not present

## 2022-07-30 DIAGNOSIS — E782 Mixed hyperlipidemia: Secondary | ICD-10-CM

## 2022-07-30 DIAGNOSIS — Z720 Tobacco use: Secondary | ICD-10-CM

## 2022-07-30 DIAGNOSIS — I2510 Atherosclerotic heart disease of native coronary artery without angina pectoris: Secondary | ICD-10-CM | POA: Diagnosis not present

## 2022-07-30 DIAGNOSIS — I10 Essential (primary) hypertension: Secondary | ICD-10-CM | POA: Diagnosis not present

## 2022-07-30 NOTE — Progress Notes (Incomplete)
LIPID CLINIC CONSULT NOTE  Chief Complaint:  Manage dyslipidemia  Primary Care Physician: Vivi Barrack, MD  Primary Cardiologist:  None  HPI:  Nichole Delgado is a 71 y.o. female who is being seen today for the evaluation of dyslipidemia at the request of Vivi Barrack, MD.  This is a pleasant 71 year old female kindly referred by Dr. Drema Dallas for management of dyslipidemia.  According to Nichole Delgado Dr. Drema Dallas is going to retire soon.  She has a past medical history significant for dyslipidemia, borderline hypertension, 50-pack-year smoking, anxiety, IBS, migraines and vertiginous symptoms.  She also has dyslipidemia and more recently in June 2021 total cholesterol was 180, HDL 78, LDL 83 and triglycerides 117.  Overall pretty reassuring lipid profile, except she had 2 recent chest CTs to follow-up on nodules and was found to have aortic atherosclerosis and scattered foci of coronary artery calcification.  There was also a small persistent pericardial effusion.  EKG shows sinus rhythm at 67.  She reports she is asymptomatic, denying chest pain or shortness of breath.  04/27/2021  Nichole Delgado returns today for follow-up.  Her blood pressure is now better controlled.  I started losartan 25 mg daily.  Reports that she continues to smoke.  She has an upcoming bladder biopsy.  Her husband apparently also has a history of bladder cancer.  Hopefully this is benign.  Her lipids were higher than ideal although not bad with LDL of 83.  Plan was to work on diet and weight loss and have repeat lipids now at 6 months.  We will go ahead and try to obtain that.  We will try to target LDL less than 70 given her coronary calcification and aortic atherosclerosis.  PMHx:  Past Medical History:  Diagnosis Date   Cervical spondylosis    Complex regional pain syndrome I    followed by pain clinic, pcp,  has spinal cord stimulator   DDD (degenerative disc disease), lumbar    GAD (generalized anxiety  disorder)    GERD (gastroesophageal reflux disease)    History of basal cell carcinoma (BCC) excision    per pt moh's sx on nose in 2020   History of chronic bronchitis    uses inhaler prn   Hypertension    followed by pcp and dr Prairie Stenberg   Irritable bowel syndrome with diarrhea    followed by dr Watt Climes (gi)   Lesion of bladder    Migraines    neurology-- [facility] [place]   Mixed dyslipidemia    followed by lipid clinic--- dr Debara Pickett   Neuropathy    OA (osteoarthritis)    PONV (postoperative nausea and vomiting)    severe   Pre-diabetes    S/P insertion of spinal 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   Stress incontinence    SUI (stress urinary incontinence, female)    Thyroid cyst    04-24-2021  pt stated recently dx and has been referred to endocrinologist   Vertigo    Wears glasses     Past Surgical History:  Procedure Laterality Date   BACK SURGERY     BLADDER SURGERY  05/12/2021   for stress incontinence per pt   CATARACT EXTRACTION W/ INTRAOCULAR LENS  IMPLANT, BILATERAL  2005   COLONOSCOPY W/ POLYPECTOMY     CYSTOSCOPY WITH BIOPSY N/A 05/12/2021   Procedure: CYSTOSCOPY WITH BIOPSY AND FULGERATION;  Surgeon: Robley Fries, MD;  Location: [facility];  Service: Urology;  Laterality: N/A;  [place] Right 2011   hammer toe   ORIF ANKLE FRACTURE Left 03/27/2017   Procedure: OPEN REDUCTION INTERNAL FIXATION (ORIF) BIMALLEOLAR ANKLE FRACTURE;  Surgeon: Renette Butters, MD;  Location: [facility];  Service: Orthopedics;  Laterality: Left;   ORIF CALCANEOUS FRACTURE Right 04/05/2017   Procedure: OPEN REDUCTION INTERNAL FIXATION (ORIF) CALCANEOUS AND FIBULA FRACTURE;  Surgeon: Renette Butters, MD;  Location: [facility];  Service: Orthopedics;  Laterality: Right;   POSTERIOR LUMBAR FUSION  09-08-2020  '@[facility]'$    L5--S1   SPINAL CORD STIMULATOR INSERTION  08/15/2018   SUPRACERVICAL ABDOMINAL HYSTERECTOMY  1988   TONSILLECTOMY AND ADENOIDECTOMY   child    FAMHx:  Family History  Problem Relation Age of Onset   Heart attack Father    Migraines Mother    Cancer Sister        Pancreatic   Cancer Brother        Liver   Migraines Daughter    Breast cancer Neg Hx     SOCHx:   reports that she has been smoking cigarettes. She has a 45.00 pack-year smoking history. She has never used smokeless tobacco. She reports that she does not currently use alcohol. She reports that she does not use drugs.  ALLERGIES:  Allergies  Allergen Reactions   Penicillins Hives    Has patient had a PCN reaction causing immediate rash, facial/tongue/throat swelling, SOB or lightheadedness with hypotension: No Has patient had a PCN reaction causing severe rash involving mucus membranes or skin necrosis: Yes Has patient had a PCN reaction that required hospitalization No Has patient had a PCN reaction occurring within the last 10 years: No If all of the above answers are "NO", then may proceed with Cephalosporin use.    Sulfa Antibiotics Hives and Rash   Codeine Nausea Only   Hydrocodone Rash   Keppra [Levetiracetam] Rash   Other Other (See Comments)    States she has allergies to other seizure medications but doesn't know which ones   Topamax [Topiramate] Nausea Only    Weight loss    ROS: Pertinent items noted in HPI and remainder of comprehensive ROS otherwise negative.  HOME MEDS: Current Outpatient Medications on File Prior to Visit  Medication Sig Dispense Refill   albuterol (VENTOLIN HFA) 108 (90 Base) MCG/ACT inhaler Inhale 2 puffs into the lungs every 6 (six) hours as needed for wheezing or shortness of breath. 6.7 g 2   Azelaic Acid 15 % gel Apply topically daily. After skin is thoroughly washed and patted dry, gently but thoroughly massage a thin film of azelaic acid cream into the affected area twice daily, in the morning and evening.     Ca Phosphate-Cholecalciferol 250-500 MG-UNIT CHEW Chew 1 each by mouth 2 (two) times daily.      Calcium Carb-Cholecalciferol (CALCIUM 500 +D PO) Take 1 tablet by mouth daily.     Cholecalciferol (VITAMIN D3) 50 MCG (2000 UT) TABS Take 1 tablet by mouth daily.     Eluxadoline (VIBERZI) 75 MG TABS Take 1 tablet by mouth 2 (two) times daily.     estradiol (CLIMARA - DOSED IN MG/24 HR) 0.1 mg/24hr patch Place 0.1 mg onto the skin 2 (two) times a week.     fluticasone (FLONASE) 50 MCG/ACT nasal spray Place into both nostrils daily.     fluticasone-salmeterol (ADVAIR HFA) 115-21 MCG/ACT inhaler Inhale 2 puffs into the lungs 2 (two) times daily. 1 each 12   ibuprofen (ADVIL,MOTRIN) 200  MG tablet Take 400 mg by mouth every 6 (six) hours as needed for mild pain.     loperamide (IMODIUM) 2 MG capsule Take 2 mg by mouth as needed.     losartan (COZAAR) 25 MG tablet Take 1 tablet by mouth once daily 90 tablet 0   meclizine (ANTIVERT) 25 MG tablet Take 25 mg by mouth 3 (three) times daily as needed for dizziness.     omeprazole (PRILOSEC) 20 MG capsule Take 1 capsule (20 mg total) by mouth 2 (two) times daily before a meal. (Patient taking differently: Take 20 mg by mouth 2 (two) times daily as needed.) 60 capsule 0   ondansetron (ZOFRAN) 8 MG tablet Take by mouth every 8 (eight) hours as needed for nausea or vomiting.     perphenazine (TRILAFON) 2 MG tablet Take 2 mg by mouth at bedtime.     pravastatin (PRAVACHOL) 40 MG tablet TAKE 1 TABLET BY MOUTH AT BEDTIME 90 tablet 0   traMADol (ULTRAM) 50 MG tablet Take by mouth every 6 (six) hours as needed.     zolpidem (AMBIEN) 10 MG tablet Take 10 mg by mouth at bedtime as needed for sleep.     No current facility-administered medications on file prior to visit.    LABS/IMAGING: No results found for this or any previous visit (from the past 48 hour(s)). No results found.  LIPID PANEL:    Component Value Date/Time   CHOL 171 07/27/2022 1700   TRIG 100 07/27/2022 1700   HDL 80 07/27/2022 1700   CHOLHDL 2.1 07/27/2022 1700   LDLCALC 73 07/27/2022  1700    WEIGHTS: Wt Readings from Last 3 Encounters:  07/30/22 133 lb (60.3 kg)  07/21/22 134 lb (60.8 kg)  06/16/22 132 lb 12.8 oz (60.2 kg)    VITALS: BP 112/64   Pulse 68   Ht '4\' 10"'$  (1.473 m)   Wt 133 lb (60.3 kg)   SpO2 96%   BMI 27.80 kg/m   EXAM: Deferred  EKG: Deferred  ASSESSMENT: Mixed dyslipidemia, goal LDL <70 Aortic and coronary artery calcification Essential hypertension 50-pack-year smoker  PLAN: 1.   Nichole Delgado has had improvement in her blood pressure with addition of losartan.  Her cholesterol needs to be reassessed with a target LDL less than 70.  We will go ahead and obtain that fasting in the near future.  Ultimately we may need to adjust or add medication.  Unfortunately she continues to smoke.  She is having a bladder biopsy and she understands this is a big risk factor for urologic cancers.  Hopefully she has a negative findings but it is a good opportunity to work on smoking cessation.  Plan follow-up with me annually or sooner as necessary  Pixie Casino, MD, [facility], [place] Director of the Advanced Lipid Disorders &  Cardiovascular Risk Reduction Clinic Diplomate of the American Board of Clinical Lipidology Attending Cardiologist  Direct Dial: [phone]  Fax: [phone]  Website:  [email or web address] [email or web address] Chandell Attridge 07/30/2022, 11:23 AM

## 2022-07-30 NOTE — Patient Instructions (Signed)
Medication Instructions:  NO CHANGES  *If you need a refill on your cardiac medications before your next appointment, please call your pharmacy*   Lab Work: FASTING lab work to check cholesterol in about 1 year   If you have labs (blood work) drawn today and your tests are completely normal, you will receive your results only by: [place] (if you have MyChart) OR A paper copy in the mail If you have any lab test that is abnormal or we need to change your treatment, we will call you to review the results.   Testing/Procedures: Lexiscan Myoview Stress Test at [facility] -- [address] [employer]   Follow-Up: At [facility], you and your health needs are our priority.  As part of our continuing mission to provide you with exceptional heart care, we have created designated Provider Care Teams.  These Care Teams include your primary Cardiologist (physician) and Advanced Practice Providers (APPs -  Physician Assistants and Nurse Practitioners) who all work together to provide you with the care you need, when you need it.  We recommend signing up for the patient portal called "MyChart".  Sign up information is provided on this After Visit Summary.  MyChart is used to connect with patients for Virtual Visits (Telemedicine).  Patients are able to view lab/test results, encounter notes, upcoming appointments, etc.  Non-urgent messages can be sent to your provider as well.   To learn more about what you can do with MyChart, go to [email or web address].    Your next appointment:   12 month(s)  The format for your next appointment:   In Person  Provider:   Lyman Bishop MD - lipid clinic

## 2022-08-02 ENCOUNTER — Telehealth (HOSPITAL_COMMUNITY): Payer: Self-pay | Admitting: *Deleted

## 2022-08-02 DIAGNOSIS — M79671 Pain in right foot: Secondary | ICD-10-CM | POA: Diagnosis not present

## 2022-08-02 DIAGNOSIS — M79672 Pain in left foot: Secondary | ICD-10-CM | POA: Diagnosis not present

## 2022-08-02 DIAGNOSIS — Z79891 Long term (current) use of opiate analgesic: Secondary | ICD-10-CM | POA: Diagnosis not present

## 2022-08-02 NOTE — Telephone Encounter (Signed)
Left message on voicemail per DPR in reference to upcoming appointment scheduled on 08/06/2022 at 10:45 with detailed instructions given per Myocardial Perfusion Study Information Sheet for the test. LM to arrive 15 minutes early, and that it is imperative to arrive on time for appointment to keep from having the test rescheduled. If you need to cancel or reschedule your appointment, please call the office within 24 hours of your appointment. Failure to do so may result in a cancellation of your appointment, and a $50 no show fee. Phone number given for call back for any questions.

## 2022-08-03 ENCOUNTER — Other Ambulatory Visit: Payer: Self-pay | Admitting: Internal Medicine

## 2022-08-04 ENCOUNTER — Ambulatory Visit (HOSPITAL_COMMUNITY): Payer: Medicare Other | Attending: Internal Medicine

## 2022-08-04 VITALS — Ht <= 58 in | Wt 133.0 lb

## 2022-08-04 DIAGNOSIS — R079 Chest pain, unspecified: Secondary | ICD-10-CM | POA: Insufficient documentation

## 2022-08-04 DIAGNOSIS — Z01810 Encounter for preprocedural cardiovascular examination: Secondary | ICD-10-CM | POA: Insufficient documentation

## 2022-08-04 DIAGNOSIS — R110 Nausea: Secondary | ICD-10-CM | POA: Insufficient documentation

## 2022-08-04 LAB — MYOCARDIAL PERFUSION IMAGING
LV dias vol: 44 mL (ref 46–106)
LV sys vol: 12 mL
Nuc Stress EF: 73 %
Peak HR: 104 {beats}/min
Rest HR: 66 {beats}/min
Rest Nuclear Isotope Dose: 10.2 mCi
SDS: 0
SRS: 0
SSS: 0
ST Depression (mm): 0 mm
Stress Nuclear Isotope Dose: 31.6 mCi
TID: 0.92

## 2022-08-04 MED ORDER — TECHNETIUM TC 99M TETROFOSMIN IV KIT
31.6000 | PACK | Freq: Once | INTRAVENOUS | Status: AC | PRN
  Administered 2022-08-04: 31.6 via INTRAVENOUS

## 2022-08-04 MED ORDER — TECHNETIUM TC 99M TETROFOSMIN IV KIT
10.2000 | PACK | Freq: Once | INTRAVENOUS | Status: AC | PRN
  Administered 2022-08-04: 10.2 via INTRAVENOUS

## 2022-08-04 MED ORDER — REGADENOSON 0.4 MG/5ML IV SOLN
0.4000 mg | Freq: Once | INTRAVENOUS | Status: AC
  Administered 2022-08-04: 0.4 mg via INTRAVENOUS

## 2022-08-04 MED ORDER — AMINOPHYLLINE 25 MG/ML IV SOLN
75.0000 mg | Freq: Once | INTRAVENOUS | Status: AC
  Administered 2022-08-04: 150 mg via INTRAVENOUS

## 2022-08-07 ENCOUNTER — Other Ambulatory Visit: Payer: Self-pay | Admitting: Family Medicine

## 2022-08-09 ENCOUNTER — Ambulatory Visit (INDEPENDENT_AMBULATORY_CARE_PROVIDER_SITE_OTHER): Payer: Medicare Other

## 2022-08-09 VITALS — Ht <= 58 in | Wt 133.0 lb

## 2022-08-09 DIAGNOSIS — Z0000 Encounter for general adult medical examination without abnormal findings: Secondary | ICD-10-CM | POA: Diagnosis not present

## 2022-08-09 NOTE — Patient Instructions (Addendum)
Nichole Delgado , Thank you for taking time to come for your Medicare Wellness Visit. I appreciate your ongoing commitment to your health goals. Please review the following plan we discussed and let me know if I can assist you in the future.   These are the goals we discussed:  Goals       Patient Stated     Water aerobics (pt-stated)      Other     Track My Symptoms-Irritable Bowel Syndrome      Follow Up Date 08/16/22    - track what makes symptoms worse and what makes them better    Why is this important?   Keeping track of symptoms that you have helps you to understand your condition. You will also learn what works to manage it.  You and your doctor will then be able to come up with the best treatment plan for you.    Notes: Workin on [employer] assistance!        This is a list of the screening recommended for you and due dates:  Health Maintenance  Topic Date Due   Flu Shot  07/20/2022   Colon Cancer Screening  08/12/2022*   COVID-19 Vaccine (7 - Mixed Product risk series) 08/25/2022*   Pneumonia Vaccine (2 - PCV) 10/20/2022*   Tetanus Vaccine  10/20/2022*   Zoster (Shingles) Vaccine (1 of 2) 11/09/2022*   Mammogram  04/13/2024   DEXA scan (bone density measurement)  Completed   Hepatitis C Screening: USPSTF Recommendation to screen - Ages 39-79 yo.  Completed   HPV Vaccine  Aged Out  *Topic was postponed. The date shown is not the original due date.   Opioid Pain Medicine Management Opioids are powerful medicines that are used to treat moderate to severe pain. When used for short periods of time, they can help you to: Sleep better. Do better in physical or occupational therapy. Feel better in the first few days after an injury. Recover from surgery. Opioids should be taken with the supervision of a trained health care provider. They should be taken for the shortest period of time possible. This is because opioids can be addictive, and the longer you take opioids, the  greater your risk of addiction. This addiction can also be called opioid use disorder. What are the risks? Using opioid pain medicines for longer than 3 days increases your risk of side effects. Side effects include: Constipation. Nausea and vomiting. Breathing difficulties (respiratory depression). Drowsiness. Confusion. Opioid use disorder. Itching. Taking opioid pain medicine for a long period of time can affect your ability to do daily tasks. It also puts you at risk for: Motor vehicle crashes. Depression. Suicide. Heart attack. Overdose, which can be life-threatening. What is a pain treatment plan? A pain treatment plan is an agreement between you and your health care provider. Pain is unique to each person, and treatments vary depending on your condition. To manage your pain, you and your health care provider need to work together. To help you do this: Discuss the goals of your treatment, including how much pain you might expect to have and how you will manage the pain. Review the risks and benefits of taking opioid medicines. Remember that a good treatment plan uses more than one approach and minimizes the chance of side effects. Be honest about the amount of medicines you take and about any drug or alcohol use. Get pain medicine prescriptions from only one health care provider. Pain can be managed with many types  of alternative treatments. Ask your health care provider to refer you to one or more specialists who can help you manage pain through: Physical or occupational therapy. Counseling (cognitive behavioral therapy). Good nutrition. Biofeedback. Massage. Meditation. Non-opioid medicine. Following a gentle exercise program. How to use opioid pain medicine Taking medicine Take your pain medicine exactly as told by your health care provider. Take it only when you need it. If your pain gets less severe, you may take less than your prescribed dose if your health care  provider approves. If you are not having pain, do nottake pain medicine unless your health care provider tells you to take it. If your pain is severe, do nottry to treat it yourself by taking more pills than instructed on your prescription. Contact your health care provider for help. Write down the times when you take your pain medicine. It is easy to become confused while on pain medicine. Writing the time can help you avoid overdose. Take other over-the-counter or prescription medicines only as told by your health care provider. Keeping yourself and others safe  While you are taking opioid pain medicine: Do not drive, use machinery, or power tools. Do not sign legal documents. Do not drink alcohol. Do not take sleeping pills. Do not supervise children by yourself. Do not do activities that require climbing or being in high places. Do not go to a lake, river, ocean, spa, or swimming pool. Do not share your pain medicine with anyone. Keep pain medicine in a locked cabinet or in a secure area where pets and children cannot reach it. Stopping your use of opioids If you have been taking opioid medicine for more than a few weeks, you may need to slowly decrease (taper) how much you take until you stop completely. Tapering your use of opioids can decrease your risk of symptoms of withdrawal, such as: Pain and cramping in the abdomen. Nausea. Sweating. Sleepiness. Restlessness. Uncontrollable shaking (tremors). Cravings for the medicine. Do not attempt to taper your use of opioids on your own. Talk with your health care provider about how to do this. Your health care provider may prescribe a step-down schedule based on how much medicine you are taking and how long you have been taking it. Getting rid of leftover pills Do not save any leftover pills. Get rid of leftover pills safely by: Taking the medicine to a prescription take-back program. This is usually offered by the county or law  enforcement. Bringing them to a pharmacy that has a drug disposal container. Flushing them down the toilet. Check the label or package insert of your medicine to see whether this is safe to do. Throwing them out in the trash. Check the label or package insert of your medicine to see whether this is safe to do. If it is safe to throw it out, remove the medicine from the original container, put it into a sealable bag or container, and mix it with used coffee grounds, food scraps, dirt, or cat litter before putting it in the trash. Follow these instructions at home: Activity Do exercises as told by your health care provider. Avoid activities that make your pain worse. Return to your normal activities as told by your health care provider. Ask your health care provider what activities are safe for you. General instructions You may need to take these actions to prevent or treat constipation: Drink enough fluid to keep your urine pale yellow. Take over-the-counter or prescription medicines. Eat foods that are high in fiber,  such as beans, whole grains, and fresh fruits and vegetables. Limit foods that are high in fat and processed sugars, such as fried or sweet foods. Keep all follow-up visits. This is important. Where to find support If you have been taking opioids for a long time, you may benefit from receiving support for quitting from a local support group or counselor. Ask your health care provider for a referral to these resources in your area. Where to find more information Centers for Disease Control and Prevention (CDC): http://www.wolf.info/ [place] Food and Drug Administration (FDA): [employer] Get help right away if: You may have taken too much of an opioid (overdosed). Common symptoms of an overdose: Your breathing is slower or more shallow than normal. You have a very slow heartbeat (pulse). You have slurred speech. You have nausea and vomiting. Your pupils become very small. You have other  potential symptoms: You are very confused. You faint or feel like you will faint. You have cold, clammy skin. You have blue lips or fingernails. You have thoughts of harming yourself or harming others. These symptoms may represent a serious problem that is an emergency. Do not wait to see if the symptoms will go away. Get medical help right away. Call your local emergency services (911 in the [place] Do not drive yourself to the hospital.  If you ever feel like you may hurt yourself or others, or have thoughts about taking your own life, get help right away. Go to your nearest emergency department or: Call your local emergency services (911 in the [place] Call the [facility] (534)677-4945 in the [place] Call a suicide crisis helpline, such as the [place] at 919-879-9384 or 988 in the [place]. This is open 24 hours a day in the [place] Text the Crisis Text Line at (604)880-7818 (in the [place].). Summary Opioid medicines can help you manage moderate to severe pain for a short period of time. A pain treatment plan is an agreement between you and your health care provider. Discuss the goals of your treatment, including how much pain you might expect to have and how you will manage the pain. If you think that you or someone else may have taken too much of an opioid, get medical help right away. This information is not intended to replace advice given to you by your health care provider. Make sure you discuss any questions you have with your health care provider. Document Revised: 07/01/2021 Document Reviewed: 03/18/2021 Elsevier Patient Education  [employer].

## 2022-08-09 NOTE — Progress Notes (Signed)
Subjective:   Nichole Delgado is a 71 y.o. female who presents for Medicare Annual (Subsequent) preventive examination.  Review of Systems    No ROS.  Medicare Wellness Virtual Visit.  Visual/audio telehealth visit, UTA vital signs.   See social history for additional risk factors.   Cardiac Risk Factors include: advanced age (>77mn, >>24women);hypertension     Objective:    Today's Vitals   08/09/22 1152  Weight: 133 lb (60.3 kg)  Height: '4\' 10"'$  (1.473 m)   Body mass index is 27.8 kg/m.     08/09/2022   12:03 PM 11/24/2021    3:26 PM 07/27/2021   11:53 AM 05/12/2021    6:24 AM 07/14/2020   12:46 PM 07/24/2019   11:19 AM 03/10/2018    3:54 PM  Advanced Directives  Does Patient Have a Medical Advance Directive? Yes Yes Yes Yes Yes No No  Type of AParamed[place]of [place]Living will [place] will Healthcare Power of [place] will     Does patient want to make changes to medical advance directive? No - Patient declined No - Patient declined  No - Patient declined No - Patient declined    Copy of [place] Chart? Yes - validated most recent copy scanned in chart (See row information) No - copy requested No - copy requested No - copy requested     Would patient like information on creating a medical advance directive?      Yes (ED - Information included in AVS)     Current Medications (verified) Outpatient Encounter Medications as of 08/09/2022  Medication Sig   albuterol (VENTOLIN HFA) 108 (90 Base) MCG/ACT inhaler Inhale 2 puffs into the lungs every 6 (six) hours as needed for wheezing or shortness of breath.   Azelaic Acid 15 % gel Apply topically daily. After skin is thoroughly washed and patted dry, gently but thoroughly massage a thin film of azelaic acid cream into the affected area twice daily, in the morning and evening.   Ca Phosphate-Cholecalciferol 250-500 MG-UNIT  CHEW Chew 1 each by mouth 2 (two) times daily.   Calcium Carb-Cholecalciferol (CALCIUM 500 +D PO) Take 1 tablet by mouth daily.   Cholecalciferol (VITAMIN D3) 50 MCG (2000 UT) TABS Take 1 tablet by mouth daily.   Eluxadoline (VIBERZI) 75 MG TABS Take 1 tablet by mouth 2 (two) times daily.   estradiol (CLIMARA - DOSED IN MG/24 HR) 0.1 mg/24hr patch Place 0.1 mg onto the skin 2 (two) times a week.   fluticasone (FLONASE) 50 MCG/ACT nasal spray Place into both nostrils daily.   fluticasone-salmeterol (ADVAIR HFA) 115-21 MCG/ACT inhaler Inhale 2 puffs into the lungs 2 (two) times daily.   ibuprofen (ADVIL,MOTRIN) 200 MG tablet Take 400 mg by mouth every 6 (six) hours as needed for mild pain.   loperamide (IMODIUM) 2 MG capsule Take 2 mg by mouth as needed.   losartan (COZAAR) 25 MG tablet Take 1 tablet (25 mg total) by mouth daily.   meclizine (ANTIVERT) 25 MG tablet Take 25 mg by mouth 3 (three) times daily as needed for dizziness.   omeprazole (PRILOSEC) 20 MG capsule Take 1 capsule (20 mg total) by mouth 2 (two) times daily before a meal. (Patient taking differently: Take 20 mg by mouth 2 (two) times daily as needed.)   ondansetron (ZOFRAN) 8 MG tablet Take by mouth every 8 (eight) hours as needed for nausea or vomiting.  perphenazine (TRILAFON) 2 MG tablet Take 2 mg by mouth at bedtime.   pravastatin (PRAVACHOL) 40 MG tablet TAKE 1 TABLET BY MOUTH AT BEDTIME   traMADol (ULTRAM) 50 MG tablet Take by mouth every 6 (six) hours as needed.   zolpidem (AMBIEN) 10 MG tablet Take 10 mg by mouth at bedtime as needed for sleep.   No facility-administered encounter medications on file as of 08/09/2022.    Allergies (verified) Penicillins, Sulfa antibiotics, Codeine, Hydrocodone, Keppra [levetiracetam], Other, and Topamax [topiramate]   History: Past Medical History:  Diagnosis Date   Cervical spondylosis    Complex regional pain syndrome I    followed by pain clinic, pcp,  has spinal cord  stimulator   DDD (degenerative disc disease), lumbar    GAD (generalized anxiety disorder)    GERD (gastroesophageal reflux disease)    History of basal cell carcinoma (BCC) excision    per pt moh's sx on nose in 2020   History of chronic bronchitis    uses inhaler prn   Hypertension    followed by pcp and dr hilty   Irritable bowel syndrome with diarrhea    followed by dr Watt Climes (gi)   Lesion of bladder    Migraines    neurology-- [facility] [place]   Mixed dyslipidemia    followed by lipid clinic--- dr Debara Pickett   Neuropathy    OA (osteoarthritis)    PONV (postoperative nausea and vomiting)    severe   Pre-diabetes    S/P insertion of spinal 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   Stress incontinence    SUI (stress urinary incontinence, female)    Thyroid cyst    04-24-2021  pt stated recently dx and has been referred to endocrinologist   Vertigo    Wears glasses    Past Surgical History:  Procedure Laterality Date   BACK SURGERY     BLADDER SURGERY  05/12/2021   for stress incontinence per pt   CATARACT EXTRACTION W/ INTRAOCULAR LENS  IMPLANT, BILATERAL  2005   COLONOSCOPY W/ POLYPECTOMY     CYSTOSCOPY WITH BIOPSY N/A 05/12/2021   Procedure: CYSTOSCOPY WITH BIOPSY AND FULGERATION;  Surgeon: Robley Fries, MD;  Location: [place];  Service: Urology;  Laterality: N/A;  30 MINS   FOOT SURGERY Right 2011   hammer toe   ORIF ANKLE FRACTURE Left 03/27/2017   Procedure: OPEN REDUCTION INTERNAL FIXATION (ORIF) BIMALLEOLAR ANKLE FRACTURE;  Surgeon: Renette Butters, MD;  Location: [place];  Service: Orthopedics;  Laterality: Left;   ORIF CALCANEOUS FRACTURE Right 04/05/2017   Procedure: OPEN REDUCTION INTERNAL FIXATION (ORIF) CALCANEOUS AND FIBULA FRACTURE;  Surgeon: Renette Butters, MD;  Location: [place];  Service: Orthopedics;  Laterality: Right;   POSTERIOR LUMBAR FUSION  09-08-2020  '@[facility]'$    L5--S1   SPINAL CORD STIMULATOR INSERTION  08/15/2018    SUPRACERVICAL ABDOMINAL HYSTERECTOMY  1988   TONSILLECTOMY AND ADENOIDECTOMY  child   Family History  Problem Relation Age of Onset   Heart attack Father    Migraines Mother    Cancer Sister        Pancreatic   Cancer Brother        Liver   Migraines Daughter    Breast cancer Neg Hx    Social History   Socioeconomic History   Marital status: Married    Spouse name: Not on file   Number of children: 1   Years of education: 12   Highest education level:  Not on file  Occupational History   Occupation: N/A  Tobacco Use   Smoking status: Every Day    Packs/day: 1.00    Years: 45.00    Total pack years: 45.00    Types: Cigarettes   Smokeless tobacco: Never   Tobacco comments:    Smoke 1 pack a day. Tay 07/21/2022  Vaping Use   Vaping Use: Never used  Substance and Sexual Activity   Alcohol use: Not Currently    Alcohol/week: 0.0 standard drinks of alcohol   Drug use: Never   Sexual activity: Not on file  Other Topics Concern   Not on file  Social History Narrative   Lives at home with her husband   Right-handed   Drinks about 5 cups of coffee per day   Social Determinants of Health   Financial Resource Strain: Low Risk  (08/09/2022)   Overall Financial Resource Strain (CARDIA)    Difficulty of Paying Living Expenses: Not hard at all  Food Insecurity: No Food Insecurity (08/09/2022)   Hunger Vital Sign    Worried About Running Out of Food in the Last Year: Never true    Ran Out of Food in the Last Year: Never true  Transportation Needs: No Transportation Needs (08/09/2022)   PRAPARE - Hydrologist (Medical): No    Lack of Transportation (Non-Medical): No  Physical Activity: Unknown (08/09/2022)   Exercise Vital Sign    Days of Exercise per Week: 0 days    Minutes of Exercise per Session: Not on file  Stress: No Stress Concern Present (08/09/2022)   [place]    Feeling of  Stress : Not at all  Social Connections: Moderately Isolated (08/09/2022)   Social Connection and Isolation Panel [NHANES]    Frequency of Communication with Friends and Family: More than three times a week    Frequency of Social Gatherings with Friends and Family: More than three times a week    Attends Religious Services: Never    Marine scientist or Organizations: No    Attends Archivist Meetings: Never    Marital Status: Married    Tobacco Counseling Ready to quit: Not Answered Counseling given: Not Answered Tobacco comments: Smoke 1 pack a day. Tay 07/21/2022   Clinical Intake:  Pre-visit preparation completed: Yes        Diabetes: No  How often do you need to have someone help you when you read instructions, pamphlets, or other written materials from your doctor or pharmacy?: 1 - Never   Interpreter Needed?: No      Activities of Daily Living    08/09/2022   11:53 AM  In your present state of health, do you have any difficulty performing the following activities:  Hearing? 0  Vision? 0  Difficulty concentrating or making decisions? 0  Walking or climbing stairs? 1  Comment Foot drop. Chronic L ankle pain. Followed by Foot/ankle specialist Dr. Lucia Gaskins  Dressing or bathing? 0  Doing errands, shopping? 0  Preparing Food and eating ? N  Using the Toilet? N  In the past six months, have you accidently leaked urine? N  Comment Stress incontinence. Managed with daily pad/liner.  Do you have problems with loss of bowel control? Y  Comment hronic diarrhea. Followed by Gertie Fey. Managed with medication. Next appointment scheduled 08/17/22.  Managing your Medications? N  Managing your Finances? N  Housekeeping or managing your Housekeeping? N  Patient Care Team: Vivi Barrack, MD as PCP - General (Family Medicine) Almedia Balls, MD as Consulting Physician (Orthopedic Surgery) Clydell Hakim, MD (Inactive) as Consulting Physician  (Anesthesiology) Leighton Ruff, MD (Inactive) as Consulting Physician (Family Medicine) Shon Hough, MD as Consulting Physician (Ophthalmology) Edythe Clarity, [facility] as Pharmacist (Pharmacist)  Indicate any recent Medical Services you may have received from other than [facility] providers in the past year (date may be approximate).     Assessment:   This is a routine wellness examination for Nichole Delgado.  Virtual Visit via Telephone Note  I connected with  Nichole Delgado on 08/09/22 at 11:45 AM EDT by telephone and verified that I am speaking with the correct person using two identifiers.  Location: Patient: home Provider: office Persons participating in the virtual visit: patient/Nurse Health Advisor   I discussed the limitations of performing an evaluation and management service by telehealth. We continued and completed visit with audio only. Some vital signs may be absent or patient reported.   Hearing/Vision screen Hearing Screening - Comments:: Pt denies any hearing issues  Vision Screening - Comments:: Pt follows up with Dr Kennith Gain Wears glasses Bilateral cataract, extracted Annual eye exams   Dietary issues and exercise activities discussed: Current Exercise Habits: Home exercise routine, Type of exercise: stretching (Foot/ankle exercises.), Intensity: Mild   Goals Addressed               This Visit's Progress     Patient Stated     Water aerobics (pt-stated)         Depression Screen    08/09/2022   12:02 PM 11/02/2021    9:47 AM 10/22/2021    2:09 PM 07/27/2021   11:50 AM 07/09/2021    9:13 AM 11/26/2020    9:12 AM  PHQ 2/9 Scores  PHQ - 2 Score 0 0 0 1 0 0    Fall Risk    08/09/2022   11:52 AM 11/02/2021    9:47 AM 07/27/2021   11:54 AM 11/26/2020    9:13 AM  Fall Risk   Falls in the past year? 0 0 0 0  Number falls in past yr: 0 0 0   Injury with Fall?  0 0   Risk for fall due to :   Impaired vision;Impaired balance/gait;Impaired mobility    Follow up Falls evaluation completed;Falls prevention discussed  Falls prevention discussed     FALL RISK PREVENTION PERTAINING TO THE HOME: Home free of loose throw rugs in walkways, pet beds, electrical cords, etc? Yes  Adequate lighting in your home to reduce risk of falls? Yes   ASSISTIVE DEVICES UTILIZED TO PREVENT FALLS: Life alert? No  Use of a cane, walker or w/c? No  Grab bars in the bathroom? No Shower chair or bench in shower? Yes  Elevated toilet seat or a handicapped toilet? No   TIMED UP AND GO: Was the test performed? No .   Cognitive Function:        08/09/2022   12:09 PM 07/27/2021   11:57 AM  6CIT Screen  What Year? 0 points 0 points  What month? 0 points 0 points  What time? 0 points 0 points  Count back from 20 0 points 0 points  Months in reverse 0 points 0 points  Repeat phrase 0 points 0 points  Total Score 0 points 0 points    Immunizations Immunization History  Administered Date(s) Administered   Fluad Quad(high Dose 65+) 11/26/2020   Influenza  Split 09/24/2015, 10/09/2017, 09/24/2019   Influenza, High Dose Seasonal PF 09/26/2014, 09/19/2017   Influenza,inj,Quad PF,6-35 Mos 10/10/2018   Influenza-Unspecified 10/20/2021   PFIZER(Purple Top)SARS-COV-2 Vaccination 01/28/2020, 02/22/2020   Pfizer Covid-19 Vaccine Bivalent Booster 32yr & up 01/04/2022   Pneumococcal Polysaccharide-23 09/30/2001   Tdap 04/19/2008   Unspecified SARS-COV-2 Vaccination 01/28/2020, 02/22/2020, 10/20/2020   Zoster, Live 03/22/2008, 03/22/2012    TDAP status: Due, Education has been provided regarding the importance of this vaccine. Advised may receive this vaccine at local pharmacy or Health Dept. Aware to provide a copy of the vaccination record if obtained from local pharmacy or Health Dept. Verbalized acceptance and understanding.  Pneumococcal vaccine status: Due, Education has been provided regarding the importance of this vaccine. Advised may receive this  vaccine at local pharmacy or Health Dept. Aware to provide a copy of the vaccination record if obtained from local pharmacy or Health Dept. Verbalized acceptance and understanding.  Shingrix Completed?: No.    Education has been provided regarding the importance of this vaccine. Patient has been advised to call insurance company to determine out of pocket expense if they have not yet received this vaccine. Advised may also receive vaccine at local pharmacy or Health Dept. Verbalized acceptance and understanding.  Screening Tests Health Maintenance  Topic Date Due   INFLUENZA VACCINE  07/20/2022   COLONOSCOPY (Pts 45-482yrInsurance coverage will need to be confirmed)  08/12/2022 (Originally 01/23/2011)   COVID-19 Vaccine (7 - Mixed Product risk series) 08/25/2022 (Originally 03/01/2022)   Pneumonia Vaccine 6562Years old (2 - PCV) 10/20/2022 (Originally 09/30/2002)   TETANUS/TDAP  10/20/2022 (Originally 04/19/2018)   Zoster Vaccines- Shingrix (1 of 2) 11/09/2022 (Originally 07/11/1970)   MAMMOGRAM  04/13/2024   DEXA SCAN  Completed   Hepatitis C Screening  Completed   HPV VACCINES  Aged Out   Health Maintenance Health Maintenance Due  Topic Date Due   INFLUENZA VACCINE  07/20/2022   Lung Cancer Screening: CT scheduled 08/12/22.   Vision Screening: Recommended annual ophthalmology exams for early detection of glaucoma and other disorders of the eye.  Dental Screening: Recommended annual dental exams for proper oral hygiene  Community Resource Referral / Chronic Care Management: CRR required this visit?  No   CCM required this visit?  No      Plan:     I have personally reviewed and noted the following in the patient's chart:   Medical and social history Use of alcohol, tobacco or illicit drugs  Current medications and supplements including opioid prescriptions. Patient is currently taking opioid prescriptions. Information provided to patient regarding non-opioid alternatives.  Patient advised to discuss non-opioid treatment plan with their provider. Followed by Dr. [facility]WaTra[facility] ClinicFunctional ability and status Nutritional status Physical activity Advanced directives List of other physicians Hospitalizations, surgeries, and ER visits in previous 12 months Vitals Screenings to include cognitive, depression, and falls Referrals and appointments  In addition, I have reviewed and discussed with patient certain preventive protocols, quality metrics, and best practice recommendations. A written personalized care plan for preventive services as well as general preventive health recommendations were provided to patient.     OBVarney BilesLPN   07/25/56/8469

## 2022-08-10 ENCOUNTER — Encounter: Payer: Self-pay | Admitting: Acute Care

## 2022-08-10 ENCOUNTER — Ambulatory Visit (INDEPENDENT_AMBULATORY_CARE_PROVIDER_SITE_OTHER): Payer: Medicare Other | Admitting: Acute Care

## 2022-08-10 DIAGNOSIS — F17210 Nicotine dependence, cigarettes, uncomplicated: Secondary | ICD-10-CM | POA: Diagnosis not present

## 2022-08-10 NOTE — Progress Notes (Signed)
Virtual Visit via Telephone Note  I connected with Nichole Delgado on 11/03/21 at  2:00 PM EST by telephone and verified that I am speaking with the correct person using two identifiers.  Location: Patient: Home Provider: Working from home   I discussed the limitations, risks, security and privacy concerns of performing an evaluation and management service by telephone and the availability of in person appointments. I also discussed with the patient that there may be a patient responsible charge related to this service. The patient expressed understanding and agreed to proceed.  Shared Decision Making Visit Lung Cancer Screening Program [id number])   Eligibility: Age 71 y.o. Pack Years Smoking History Calculation 50 (# packs/per year x # years smoked) Recent History of coughing up blood  no Unexplained weight loss? no ( >Than 15 pounds within the last 6 months ) Prior History Lung / other cancer no (Diagnosis within the last 5 years already requiring surveillance chest CT Scans). Smoking Status Current Smoker Former Smokers: Years since quit: NA  Quit Date: NA  Visit Components: Discussion included one or more decision making aids. yes Discussion included risk/benefits of screening. yes Discussion included potential follow up diagnostic testing for abnormal scans. yes Discussion included meaning and risk of over diagnosis. yes Discussion included meaning and risk of False Positives. yes Discussion included meaning of total radiation exposure. yes  Counseling Included: Importance of adherence to annual lung cancer LDCT screening. yes Impact of comorbidities on ability to participate in the program. yes Ability and willingness to under diagnostic treatment. yes  Smoking Cessation Counseling: Current Smokers:  Discussed importance of smoking cessation. yes Information about tobacco cessation classes and interventions provided to patient. yes Patient provided with "ticket" for  LDCT Scan. yes Symptomatic Patient. yes  Counseling(Intermediate counseling: > three minutes) 99406 Diagnosis Code: Tobacco Use Z72.0 Asymptomatic Patient no  Counseling NA Former Smokers:  Discussed the importance of maintaining cigarette abstinence. yes Diagnosis Code: Personal History of Nicotine Dependence. B09.628 Information about tobacco cessation classes and interventions provided to patient. Yes Patient provided with "ticket" for LDCT Scan. yes Written Order for Lung Cancer Screening with LDCT placed in Epic. Yes (CT Chest Lung Cancer Screening Low Dose W/O CM) [id number] Z12.2-Screening of respiratory organs Z87.891-Personal history of nicotine dependence   I spent 25 minutes of face to face time with her discussing the risks and benefits of lung cancer screening. We viewed a power point together that explained in detail the above noted topics. We took the time to pause the power point at intervals to allow for questions to be asked and answered to ensure understanding. We discussed that she had taken the single most powerful action possible to decrease her risk of developing lung cancer when she quit smoking. I counseled her to remain smoke free, and to contact me if she ever had the desire to smoke again so that I can provide resources and tools to help support the effort to remain smoke free. We discussed the time and location of the scan, and that either  Doroteo Glassman RN or I will call with the results within  24-48 hours of receiving them. She has my card and contact information in the event she needs to speak with me, in addition to a copy of the power point we reviewed as a resource. She verbalized understanding of all of the above and had no further questions upon leaving the office.     I explained to the patient that there has been a  high incidence of coronary artery disease noted on these exams. I explained that this is a non-gated exam therefore degree or severity cannot be  determined. This patient is not on statin therapy. I have asked the patient to follow-up with their PCP regarding any incidental finding of coronary artery disease and management with diet or medication as they feel is clinically indicated. The patient verbalized understanding of the above and had no further questions.   I spent 3 minutes counseling on smoking cessation and the health risks of continued tobacco abuse    Lorae Roig D. Kenton Kingfisher, NP-C [facility] Personal contact information can be found on Amion  08/10/2022, 11:08 AM

## 2022-08-10 NOTE — Patient Instructions (Signed)

## 2022-08-12 ENCOUNTER — Ambulatory Visit
Admission: RE | Admit: 2022-08-12 | Discharge: 2022-08-12 | Disposition: A | Discharge status: Home / Self Care | Payer: Medicare Other | Source: Ambulatory Visit | Attending: Acute Care | Admitting: Acute Care

## 2022-08-12 DIAGNOSIS — Z122 Encounter for screening for malignant neoplasm of respiratory organs: Secondary | ICD-10-CM

## 2022-08-12 DIAGNOSIS — F17210 Nicotine dependence, cigarettes, uncomplicated: Secondary | ICD-10-CM

## 2022-08-12 DIAGNOSIS — Z87891 Personal history of nicotine dependence: Secondary | ICD-10-CM

## 2022-08-13 DIAGNOSIS — K580 Irritable bowel syndrome with diarrhea: Secondary | ICD-10-CM | POA: Diagnosis not present

## 2022-08-16 ENCOUNTER — Telehealth: Payer: Self-pay | Admitting: Family Medicine

## 2022-08-16 ENCOUNTER — Telehealth: Payer: Self-pay | Admitting: Acute Care

## 2022-08-16 NOTE — Telephone Encounter (Signed)
Attempted to contact patient. Left voicemail for patient to call back schedule appt with Dr Elsworth Soho.

## 2022-08-16 NOTE — Telephone Encounter (Signed)
Attempted to contact patient regarding lung screening CT scan. I left a voicemail message advising the patient that we have sent her message to Dr Elsworth Soho who is her pulmonary physician. I did advise patient that Dr Elsworth Soho is working in the hospital today and that we would contact her with the follow up steps as soon as possible. I left patient our call back number.

## 2022-08-16 NOTE — Telephone Encounter (Signed)
Patient states: -She is set to have surgery to have a piece of metal removed on 08/30 - She recently got results of CT chest lung cancer screening that are concerning  - She wanted to get Dr. Marigene Ehlers in sight on if she should continue with surgery   I informed patient that since PCP is out of office, she should inform her surgeon of this discovery as well as Eric Form who ordered the CT.   I offered patient an OV with PCP on 08/29 @ 8am but she declined. States she will call Eric Form.

## 2022-08-16 NOTE — Telephone Encounter (Signed)
I reviewed her screening CT.  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  Would recommend scheduling her in one of my nodule slots to review as long as Dr. Elsworth Soho agrees.

## 2022-08-16 NOTE — Telephone Encounter (Signed)
See note

## 2022-08-16 NOTE — Telephone Encounter (Signed)
Dr Elsworth Soho, please advise if you are ok with [place] adding pt to see Dr Lamonte Sakai for nodule seen on Lung Screening CT.

## 2022-08-17 NOTE — Telephone Encounter (Signed)
Patient called back in and left Voicemail stating that she will be there for the appt with Dr Elsworth Soho on 08/18/22 at 12:00. Pt has been added to the schedule.

## 2022-08-17 NOTE — Telephone Encounter (Signed)
Spoke with patient stated cancelled surgery has OV with pulmonology

## 2022-08-17 NOTE — Telephone Encounter (Signed)
Agree with her contacting her surgeon and Judson Roch. Would like for her to schedule an office visit to discuss here if needed.   Nichole Delgado. Jerline Pain, MD 08/17/2022 9:01 AM

## 2022-08-18 ENCOUNTER — Encounter: Payer: Self-pay | Admitting: Pulmonary Disease

## 2022-08-18 ENCOUNTER — Ambulatory Visit (INDEPENDENT_AMBULATORY_CARE_PROVIDER_SITE_OTHER): Payer: Medicare Other | Admitting: Pulmonary Disease

## 2022-08-18 VITALS — BP 128/68 | HR 75 | Temp 98.2°F | Ht 59.0 in | Wt 132.4 lb

## 2022-08-18 DIAGNOSIS — I2510 Atherosclerotic heart disease of native coronary artery without angina pectoris: Secondary | ICD-10-CM

## 2022-08-18 DIAGNOSIS — Z9189 Other specified personal risk factors, not elsewhere classified: Secondary | ICD-10-CM | POA: Diagnosis not present

## 2022-08-18 DIAGNOSIS — R911 Solitary pulmonary nodule: Secondary | ICD-10-CM | POA: Diagnosis not present

## 2022-08-18 DIAGNOSIS — J4541 Moderate persistent asthma with (acute) exacerbation: Secondary | ICD-10-CM | POA: Diagnosis not present

## 2022-08-18 MED ORDER — FLUTICASONE FUROATE-VILANTEROL 100-25 MCG/ACT IN AEPB: 1.0000 | INHALATION_SPRAY | Freq: Every day | RESPIRATORY_TRACT | Status: DC

## 2022-08-18 NOTE — Patient Instructions (Signed)
  X PET scan We discussed possibility of lung cancer due to enlarging left upper lung nodule  X Trial of Breo 100 instead of advair

## 2022-08-18 NOTE — Assessment & Plan Note (Signed)
This was stable from 20 18-20 21 but now appears to have enlarged over the last 2 years to its current size of 14 mm and has an increasing central solid component.  This is likely to be a very slow-growing malignancy. We will obtain PET scan to clarify. We discussed possibility of doing a navigation bronchoscopy/biopsy to diagnose and further treatment options of surgical resection versus radiation

## 2022-08-18 NOTE — Progress Notes (Signed)
   Subjective:    Patient ID: Nichole Delgado, female    DOB: 03-05-1951, 71 y.o.   MRN: [id number]  HPI  71 yo active smoker followed for DOE, pulmonary nodule and chronic bronchitis vs asthma   She continues to smoke about a pack per day, more than 50 pack years     PMH -complex regional pain syndrome after a fall and fractures of both ankles with right foot drop status post spinal cord stimulator, -IBS with diarrhea   Based on bronchodilator response on PFTs, she was started on Symbicort with a diagnosis of asthma.  However this caused her heart to race and she was switched to Advair.  Last seen 1 month ago. She was given Advair but this seems to have caused soreness of her throat and COVID-like symptoms.  She has not tolerated Symbicort earlier. She underwent low-dose CT chest which showed an enlarging left upper lobe groundglass nodule.  She was very anxious and hence an urgent appointment was scheduled today to discuss findings of CT scan She was to undergo leg surgery for removal of hardware from her ankle but she has postponed this indefinitely pending evaluation of this nodule  We reviewed CT scan in detail today and compared images to previous scans She continues to smoke about a pack per day  Significant tests/ events reviewed  [facility] chest 07/2022 increased size of groundglass left upper lobe nodule to 14.6 mm with central solid component measuring 8 mm CT chest 04/2017-10 x 7 mm groundglass opacity in the left upper lobe CT chest 11/2017- stable 10 x 7 mm groundglass opacity in the left upper lobe.  Improvement in small nodular opacities on the right.  CT chest 06/2020 stable 10 x 7 mm left upper lobe nodule   PFTs  05/27/2022 : FVC 65, FEV1 65, ratio 75.  positive bronchodilator response (19%)   01/2018 FVC 2.10 [81%], FEV1 1.64 [84%], F/F 78, TLC 91%, RV/TLC 128%, DLCO 81% Minimal obstruction, small airways disease   FENO 02/13/18-7  Review of Systems neg for any  significant sore throat, dysphagia, itching, sneezing, nasal congestion or excess/ purulent secretions, fever, chills, sweats, unintended wt loss, pleuritic or exertional cp, hempoptysis, orthopnea pnd or change in chronic leg swelling. Also denies presyncope, palpitations, heartburn, abdominal pain, nausea, vomiting, diarrhea or change in bowel or urinary habits, dysuria,hematuria, rash, arthralgias, visual complaints, headache, numbness weakness or ataxia.     Objective:   Physical Exam  Gen. Pleasant, well-nourished, in no distress ENT - no thrush, no pallor/icterus,no post nasal drip Neck: No JVD, no thyromegaly, no carotid bruits Lungs: no use of accessory muscles, no dullness to percussion, decreased bilateral without rales or rhonchi  Cardiovascular: Rhythm regular, heart sounds  normal, no murmurs or gallops, no peripheral edema Musculoskeletal: No deformities, no cyanosis or clubbing        Assessment & Plan:

## 2022-08-18 NOTE — Assessment & Plan Note (Signed)
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  We will trial Breo.  Otherwise just use albuterol as needed for rescue

## 2022-08-19 ENCOUNTER — Ambulatory Visit: Payer: Medicare Other | Admitting: Pulmonary Disease

## 2022-08-19 ENCOUNTER — Telehealth: Payer: Self-pay | Admitting: Pulmonary Disease

## 2022-08-19 DIAGNOSIS — Z23 Encounter for immunization: Secondary | ICD-10-CM | POA: Diagnosis not present

## 2022-08-20 MED ORDER — FLUTICASONE FUROATE-VILANTEROL 100-25 MCG/ACT IN AEPB: 1.0000 | INHALATION_SPRAY | Freq: Every day | RESPIRATORY_TRACT | 5 refills | Status: DC

## 2022-08-20 NOTE — Telephone Encounter (Signed)
Called and spoke with pt letting her know that we no longer carried samples of Breo but I could send Rx to pharmacy for her and she verbalized understanding. Rx sent to preferred pharmacy. Nothing further needed.

## 2022-08-23 ENCOUNTER — Telehealth: Payer: Medicare Other

## 2022-08-24 NOTE — Progress Notes (Signed)
Chronic Care Management Pharmacy Note  08/27/2022 Name:  Nichole Delgado MRN:  [id number] DOB:  10-13-1951  Summary: PharmD FU.  Recent possible malignancy found in lungs.  Increased SOB and cough recently.  Patient is on Breo and states it is helping.  Already in the process of using patient assistance.  Recommendations/Changes made from today's visit: None - will assist with Viberzi if needed  Plan: FU 6 months   Subjective: Nichole Delgado is an 71 y.o. year old female who is a primary patient of Jerline Pain, Algis Greenhouse, MD.  The CCM team was consulted for assistance with disease management and care coordination needs.    Engaged with patient by telephone for follow up visit in response to provider referral for pharmacy case management and/or care coordination services.   Consent to Services:  The patient was given the following information about Chronic Care Management services today, agreed to services, and gave verbal consent: 1. CCM service includes personalized support from designated clinical staff supervised by the primary care provider, including individualized plan of care and coordination with other care providers 2. 24/7 contact phone numbers for assistance for urgent and routine care needs. 3. Service will only be billed when office clinical staff spend 20 minutes or more in a month to coordinate care. 4. Only one practitioner may furnish and bill the service in a calendar month. 5.The patient may stop CCM services at any time (effective at the end of the month) by phone call to the office staff. 6. The patient will be responsible for cost sharing (co-pay) of up to 20% of the service fee (after annual deductible is met). Patient agreed to services and consent obtained.  Patient Care Team: Vivi Barrack, MD as PCP - General (Family Medicine) Almedia Balls, MD as Consulting Physician (Orthopedic Surgery) Clydell Hakim, MD (Inactive) as Consulting Physician  (Anesthesiology) Leighton Ruff, MD (Inactive) as Consulting Physician (Family Medicine) Shon Hough, MD as Consulting Physician (Ophthalmology) Edythe Clarity, [facility] as Pharmacist (Pharmacist)  Recent office visits:  11/02/2021 VV (PCP) Vivi Barrack, MD; no medication changes indicated.   10/22/2021 OV (PCP) Vivi Barrack, MD; no medication changes indicated.   09/10/2021 VV Allwardt, Alyssa M, PA-C; no medication changes indicated.   Recent consult visits:  11/16/2021 OV (Otolaryngology) Spainhour, Ann Lions, PA-C; no medication changes indicated.   11/05/2021 OV (Pulmonology) Rigoberto Noel, MD; no medication changes indicated, recommends home sleep apnea test.   09/02/2021 OV [facility]) Carole Binning, no medication changes indicated.   Hospital visits:  None in previous 6 months  Objective:  Lab Results  Component Value Date   CREATININE 0.97 06/12/2021   BUN 12 06/12/2021   GFR 58.62 (L) 06/12/2021   GFRNONAA 61 06/12/2020   GFRAA 74 06/12/2020   NA 138 06/12/2021   K 3.9 06/12/2021   CALCIUM 10.2 06/12/2021   CO2 27 06/12/2021   GLUCOSE 95 06/12/2021    Lab Results  Component Value Date/Time   HGBA1C 5.8 03/31/2021 03:16 PM   HGBA1C 5.8 06/12/2020 12:00 AM   GFR 58.62 (L) 06/12/2021 02:27 PM   GFR 64.22 03/31/2021 03:16 PM    Last diabetic Eye exam: No results found for: "HMDIABEYEEXA"  Last diabetic Foot exam: No results found for: "HMDIABFOOTEX"   Lab Results  Component Value Date   CHOL 171 07/27/2022   HDL 80 07/27/2022   LDLCALC 73 07/27/2022   TRIG 100 07/27/2022   CHOLHDL 2.1 07/27/2022  Latest Ref Rng & Units 06/12/2021    2:27 PM 03/31/2021    3:16 PM 06/12/2020   12:00 AM  Hepatic Function  Total Protein 6.0 - 8.3 g/dL  6.4    Albumin 3.5 - 5.2 g/dL 4.4  4.1  4.2      AST 0 - 37 U/L  17  16      ALT 0 - 35 U/L  18  19      Alk Phosphatase 39 - 117 U/L  69    Total Bilirubin 0.2 - 1.2  mg/dL  0.3       This result is from an external source.    Lab Results  Component Value Date/Time   TSH 1.58 06/16/2022 01:07 PM   TSH 1.98 03/31/2021 03:16 PM   FREET4 0.85 06/16/2022 01:07 PM       Latest Ref Rng & Units 05/27/2022    2:35 PM 05/12/2021    6:59 AM 03/31/2021    3:16 PM  CBC  WBC 4.0 - 10.5 K/uL 7.9   7.5   Hemoglobin 12.0 - 15.0 g/dL 14.2  13.6  13.8   Hematocrit 36.0 - 46.0 % 42.2  40.0  40.8   Platelets 150.0 - 400.0 K/uL 252.0   266.0     Lab Results  Component Value Date/Time   VD25OH 31.6 09/10/2019 12:00 AM   VD25OH 24.9 06/07/2019 12:00 AM    Clinical ASCVD: Yes  The 10-year ASCVD risk score (Arnett DK, et al., 2019) is: 24%   Values used to calculate the score:     Age: 32 years     Sex: Female     Is Non-Hispanic African American: No     Diabetic: No     Tobacco smoker: Yes     Systolic Blood Pressure: 875 mmHg     Is BP treated: Yes     HDL Cholesterol: 80 mg/dL     Total Cholesterol: 171 mg/dL       08/09/2022   12:02 PM 11/02/2021    9:47 AM 10/22/2021    2:09 PM  Depression screen PHQ 2/9  Decreased Interest 0 0 0  Down, Depressed, Hopeless 0 0 0  PHQ - 2 Score 0 0 0     Social History   Tobacco Use  Smoking Status Every Day   Packs/day: 1.00   Years: 50.00   Total pack years: 50.00   Types: Cigarettes  Smokeless Tobacco Never  Tobacco Comments   Vaping. ARJ 08/19/22    BP Readings from Last 3 Encounters:  08/18/22 128/68  07/30/22 112/64  07/21/22 108/70   Pulse Readings from Last 3 Encounters:  08/18/22 75  07/30/22 68  07/21/22 73   Wt Readings from Last 3 Encounters:  08/18/22 132 lb 6.4 oz (60.1 kg)  08/09/22 133 lb (60.3 kg)  08/04/22 133 lb (60.3 kg)   BMI Readings from Last 3 Encounters:  08/18/22 26.74 kg/m  08/09/22 27.80 kg/m  08/04/22 27.80 kg/m    Assessment/Interventions: Review of patient past medical history, allergies, medications, health status, including review of consultants  reports, laboratory and other test data, was performed as part of comprehensive evaluation and provision of chronic care management services.   SDOH:  (Social Determinants of Health) assessments and interventions performed: No, done recently  Financial Resource Strain: Low Risk  (08/09/2022)   Overall Financial Resource Strain (CARDIA)    Difficulty of Paying Living Expenses: Not hard at all   Food Insecurity:  No Food Insecurity (08/09/2022)   Hunger Vital Sign    Worried About Running Out of Food in the Last Year: Never true    [place] in the Last Year: Never true    SDOH Screenings   Food Insecurity: No Food Insecurity (08/09/2022)  Housing: Low Risk  (08/09/2022)  Transportation Needs: No Transportation Needs (08/09/2022)  Depression (PHQ2-9): Low Risk  (08/09/2022)  Financial Resource Strain: Low Risk  (08/09/2022)  Physical Activity: Unknown (08/09/2022)  Social Connections: Moderately Isolated (08/09/2022)  Stress: No Stress Concern Present (08/09/2022)  Tobacco Use: High Risk (08/18/2022)    CCM Care Plan  Allergies  Allergen Reactions   Penicillins Hives    Has patient had a PCN reaction causing immediate rash, facial/tongue/throat swelling, SOB or lightheadedness with hypotension: No Has patient had a PCN reaction causing severe rash involving mucus membranes or skin necrosis: Yes Has patient had a PCN reaction that required hospitalization No Has patient had a PCN reaction occurring within the last 10 years: No If all of the above answers are "NO", then may proceed with Cephalosporin use.    Sulfa Antibiotics Hives and Rash   Codeine Nausea Only   Hydrocodone Rash   Keppra [Levetiracetam] Rash   Other Other (See Comments)    States she has allergies to other seizure medications but doesn't know which ones   Topamax [Topiramate] Nausea Only    Weight loss    Medications Reviewed Today     Reviewed by Edythe Clarity, [facility] (Pharmacist) on 08/27/22 at 1058  Med  List Status: <None>   Medication Order Taking? Sig Documenting Provider Last Dose Status Informant  albuterol (VENTOLIN HFA) 108 (90 Base) MCG/ACT inhaler [id number] Yes Inhale 2 puffs into the lungs every 6 (six) hours as needed for wheezing or shortness of breath. Charlesetta Shanks, MD Taking Active Self  Azelaic Acid 15 % gel [id number] Yes Apply topically daily. After skin is thoroughly washed and patted dry, gently but thoroughly massage a thin film of azelaic acid cream into the affected area twice daily, in the morning and evening. [provider] Taking Active Self  Ca Phosphate-Cholecalciferol 250-500 MG-UNIT CHEW [id number] Yes Chew 1 each by mouth 2 (two) times daily. [provider] Taking Active Self  Calcium Carb-Cholecalciferol (CALCIUM 500 +D PO) [id number] Yes Take 1 tablet by mouth daily. [provider] Taking Active Self  Cholecalciferol (VITAMIN D3) 50 MCG (2000 UT) TABS [id number] Yes Take 1 tablet by mouth daily. [provider] Taking Active Self  Eluxadoline (VIBERZI) 75 MG TABS [id number] Yes Take 1 tablet by mouth 2 (two) times daily. [provider] Taking Active            Med Note Titus Dubin, DONNA J   Wed Jun 09, 2022  2:03 PM) Pt has been out of medication for 5 months.  estradiol (CLIMARA - DOSED IN MG/24 HR) 0.1 mg/24hr patch [id number] Yes Place 0.1 mg onto the skin 2 (two) times a week. [provider] Taking Active   fluticasone (FLONASE) 50 MCG/ACT nasal spray [id number] Yes Place into both nostrils daily. [provider] Taking Active   fluticasone furoate-vilanterol (BREO ELLIPTA) 100-25 MCG/ACT AEPB [id number] Yes Inhale 1 puff into the lungs daily. Rigoberto Noel, MD Taking Active   fluticasone-salmeterol (ADVAIR [facility]) 024-09 MCG/ACT inhaler [id number] Yes Inhale 2 puffs into the lungs 2 (two) times daily. Clayton Bibles, NP Taking Active   ibuprofen (ADVIL,MOTRIN) 200 MG tablet [id number] Yes Take 400 mg  by  mouth every 6 (six) hours as needed for mild pain. [provider] Taking Active Self  loperamide (IMODIUM) 2 MG capsule [id number] Yes Take 2 mg by mouth as needed. [provider] Taking Active   losartan (COZAAR) 25 MG tablet [id number] Yes Take 1 tablet (25 mg total) by mouth daily. Pixie Casino, MD Taking Active   meclizine (ANTIVERT) 25 MG tablet [id number] Yes Take 25 mg by mouth 3 (three) times daily as needed for dizziness. [provider] Taking Active   ondansetron (ZOFRAN) 8 MG tablet [id number] Yes Take by mouth every 8 (eight) hours as needed for nausea or vomiting. [provider] Taking Active Self  perphenazine (TRILAFON) 2 MG tablet [id number] Yes Take 2 mg by mouth at bedtime. [provider] Taking Active Self  pravastatin (PRAVACHOL) 40 MG tablet [id number] Yes TAKE 1 TABLET BY MOUTH AT BEDTIME Vivi Barrack, MD Taking Active   traMADol Veatrice Bourbon) 50 MG tablet [id number] Yes Take by mouth every 6 (six) hours as needed. [provider] Taking Active Self  zolpidem (AMBIEN) 10 MG tablet [id number] Yes Take 10 mg by mouth at bedtime as needed for sleep. [provider] Taking Active Self           Med Note Rosana Hoes, Haroon Shatto Cristobal Goldmann Feb 15, 2022 11:27 AM) Taking half a tablet            Patient Active Problem List   Diagnosis Date Noted   Pulmonary nodule less than 1 cm in diameter with moderate to high risk for malignant neoplasm 08/18/2022   Moderate persistent asthma 05/27/2022   Tobacco abuse 05/27/2022   Female stress incontinence 11/26/2020   CRPS (complex regional pain syndrome), lower limb with spinal cord simulator 11/26/2020   Essential hypertension 11/26/2020   Chronic bronchitis ([place]) 11/26/2020   Emotional disorder 11/26/2020   Menopausal symptoms 11/26/2020   Migraine 11/26/2020   Basal cell carcinoma (BCC) 11/26/2020   Other spondylosis with myelopathy, lumbar region 09/09/2020   Elevated  blood-pressure reading, without diagnosis of hypertension 08/05/2020   Spinal cord stimulator status 06/18/2020   Pain in left foot 02/13/2020   Chronic pain 11/06/2019   Hyperlipidemia, unspecified 11/06/2019   Peripheral neuropathic pain 09/27/2017   DDD (degenerative disc disease), lumbosacral    Left foot drop    GERD (gastroesophageal reflux disease) 03/26/2017   Dyslipidemia    Nicotine dependence with current use    IBS (irritable bowel syndrome)    Osteoporosis 02/24/2016   Osteoarthritis 02/24/2016   Insomnia 02/24/2016    Immunization History  Administered Date(s) Administered   Fluad Quad(high Dose 65+) 11/26/2020   Influenza Split 09/24/2015, 10/09/2017, 09/24/2019   Influenza, High Dose Seasonal PF 09/26/2014, 09/19/2017   Influenza,inj,Quad PF,6-35 Mos 10/10/2018   Influenza-Unspecified 10/20/2021   PFIZER(Purple Top)SARS-COV-2 Vaccination 01/28/2020, 02/22/2020   PNEUMOCOCCAL CONJUGATE-20 08/19/2022   Pfizer Covid-19 Vaccine Bivalent Booster 62yr & up 01/04/2022   Pneumococcal Polysaccharide-23 09/30/2001   Tdap 04/19/2008, 08/19/2022   Unspecified SARS-COV-2 Vaccination 01/28/2020, 02/22/2020, 10/20/2020   Zoster, Live 03/22/2008, 03/22/2012    Conditions to be addressed/monitored:  HTN, Migraine, GERD, Osteoporosis, HLD, Insomnia, Neuropathy  Care Plan : General Pharmacy (Adult)  Updates made by DEdythe Clarity RPH since 08/27/2022 12:00 AM     Problem: HTN, Migraine, GERD, Osteoporosis, HLD, Insomnia, Neuropathy   Priority: High  Onset Date: 02/15/2022     Long-Range Goal: Patient-Specific Goal   Start Date: 02/15/2022  Expected End Date: 08/16/2022  Recent Progress: On track  Priority: High  Note:   Current Barriers:  Pulmonary nodule Viberzi copay  Pharmacist Clinical Goal(s):  Patient will maintain control of BP as evidenced by monitoring  through collaboration with PharmD and provider.  Improve access to Viberzi  Interventions: 1:1  collaboration with Vivi Barrack, MD regarding development and update of comprehensive plan of care as evidenced by provider attestation and co-signature Inter-disciplinary care team collaboration (see longitudinal plan of care) Comprehensive medication review performed; medication list updated in electronic medical record  Hypertension (BP goal <130/80) -Controlled, not assessed -Current treatment: Losartan 57m daily Appropriate, Effective, Safe, Accessible -Medications previously tried: propranolol  -Current home readings: not checking much at home -Current exercise habits: minimal due to foot pain -Denies hypotensive/hypertensive symptoms -Educated on BP goals and benefits of medications for prevention of heart attack, stroke and kidney damage; Importance of home blood pressure monitoring; Symptoms of hypotension and importance of maintaining adequate hydration; -Counseled to monitor BP at home a few times per week, document, and provide log at future appointments -Recommended to continue current medication No changes needed at this time  Hyperlipidemia: (LDL goal < 100) -Controlled, not assessed -Current treatment: Pravastatin 478mdaily Appropriate, Query effective,  -Medications previously tried: none noted  -Current exercise habits: minimal due to foot pain -Educated on Cholesterol goals;  Benefits of statin for ASCVD risk reduction; Importance of limiting foods high in cholesterol; -Recommended to continue current medication Needs updated lipid panel  GERD (Goal: Minimize symptoms) -Controlled, not assessed -Current treatment  Omeprazole 2077mID - taking very rarely Appropriate, Effective, Safe, Accessible -Medications previously tried: none noted -She is taking very rarely, ok with this due to history of osteoporosis. -Patient probably needs updated DEXA scan last was in 2015.  -Recommended to continue current medication No changes at this time  Insomnia (Goal:  Adequate sleep) -Controlled, not assessed -Current treatment  Zolpidem 27m82me half tablet daily Appropriate, Effective, Safe, Accessible -Medications previously tried: none noted -She reports sleep is good ever since she started Ambien.  -Recommended to continue current medication No changes at this time.  Diarrhea (Goal: Reduce symptoms) 08/27/22 -Not ideally controlled -Current treatment  Viberzi 75mg30mropriate, Effective, Safe, Accessible -Medications previously tried: none noted -She has to take something daily for her diarrhea.  She was able to get Viberzi approved so now she is taking less immodium. Work on dietary changes that do not cause spasms. No changes at this time, continue current meds.  Pulmonary Nodule/SOB (Goal: Improve breathing/symptoms) 08/27/22 -Controlled -Current treatment  Breo Ellipto 100/25mcg82muff daily Appropriate, Effective, Safe, Query accessible -Medications previously tried: Symbicort, Advair, Spiriva  -Recommended to continue current medication - Recent scans show growing nodule in lungs of very small size.  She has PET scan on Monday.  Developed cough and SOB prior to a surgery and this is when it was discovered.   Will support where needed. She is applying for PAP through manufacturer for assistance and will need pulmonology to sign. She is aware of process.   Patient Goals/Self-Care Activities Patient will:  - take medications as prescribed as evidenced by patient report and record review collaborate with provider on medication access solutions target a minimum of 150 minutes of moderate intensity exercise weekly  Follow Up Plan: The care management team will reach out to the patient again over the next 180 days.            Medication Assistance: Application for Viberzi  medication assistance program.  in process.  Anticipated assistance start date unknown.  See plan of care for additional detail.  Compliance/Adherence/Medication  fill history: Care Gaps: DEXA, colonoscopy  Star-Rating Drugs: Pravastatin 40 mg last filled 08/07/2022 90 DS Losartan 25 mg last filled 08/03/2022 90 DS  Patient's preferred pharmacy is:  [facility] [place] [address] Phone: [phone] Fax: [phone]   We discussed: Benefits of medication synchronization, packaging and delivery as well as enhanced pharmacist oversight with [employer]. Patient decided to: Continue current medication management strategy  Care Plan and Follow Up Patient Decision:  Patient agrees to Care Plan and Follow-up.  Plan: The care management team will reach out to the patient again over the next 180 days.  Beverly Milch, PharmD Clinical Pharmacist  [facility] [phone]

## 2022-08-25 DIAGNOSIS — L820 Inflamed seborrheic keratosis: Secondary | ICD-10-CM | POA: Diagnosis not present

## 2022-08-25 DIAGNOSIS — L719 Rosacea, unspecified: Secondary | ICD-10-CM | POA: Diagnosis not present

## 2022-08-25 HISTORY — PX: ANKLE HARDWARE REMOVAL: SHX1149

## 2022-08-27 ENCOUNTER — Ambulatory Visit: Payer: Medicare Other | Admitting: Pharmacist

## 2022-08-27 DIAGNOSIS — R911 Solitary pulmonary nodule: Secondary | ICD-10-CM

## 2022-08-27 DIAGNOSIS — K589 Irritable bowel syndrome without diarrhea: Secondary | ICD-10-CM

## 2022-08-27 NOTE — Patient Instructions (Addendum)
Visit Information   Goals Addressed             This Visit's Progress    Track My Symptoms-Irritable Bowel Syndrome   On track    Follow Up Date 08/16/22    - track what makes symptoms worse and what makes them better    Why is this important?   Keeping track of symptoms that you have helps you to understand your condition. You will also learn what works to manage it.  You and your doctor will then be able to come up with the best treatment plan for you.    Notes: Workin on [employer] assistance!       Patient Care Plan: General Pharmacy (Adult)     Problem Identified: HTN, Migraine, GERD, Osteoporosis, HLD, Insomnia, Neuropathy   Priority: High  Onset Date: 02/15/2022     Long-Range Goal: Patient-Specific Goal   Start Date: 02/15/2022  Expected End Date: 08/16/2022  Recent Progress: On track  Priority: High  Note:   Current Barriers:  Pulmonary nodule Viberzi copay  Pharmacist Clinical Goal(s):  Patient will maintain control of BP as evidenced by monitoring  through collaboration with PharmD and provider.  Improve access to Viberzi  Interventions: 1:1 collaboration with Vivi Barrack, MD regarding development and update of comprehensive plan of care as evidenced by provider attestation and co-signature Inter-disciplinary care team collaboration (see longitudinal plan of care) Comprehensive medication review performed; medication list updated in electronic medical record  Hypertension (BP goal <130/80) -Controlled, not assessed -Current treatment: Losartan '25mg'$  daily Appropriate, Effective, Safe, Accessible -Medications previously tried: propranolol  -Current home readings: not checking much at home -Current exercise habits: minimal due to foot pain -Denies hypotensive/hypertensive symptoms -Educated on BP goals and benefits of medications for prevention of heart attack, stroke and kidney damage; Importance of home blood pressure monitoring; Symptoms of  hypotension and importance of maintaining adequate hydration; -Counseled to monitor BP at home a few times per week, document, and provide log at future appointments -Recommended to continue current medication No changes needed at this time  Hyperlipidemia: (LDL goal < 100) -Controlled, not assessed -Current treatment: Pravastatin '40mg'$  daily Appropriate, Query effective,  -Medications previously tried: none noted  -Current exercise habits: minimal due to foot pain -Educated on Cholesterol goals;  Benefits of statin for ASCVD risk reduction; Importance of limiting foods high in cholesterol; -Recommended to continue current medication Needs updated lipid panel  GERD (Goal: Minimize symptoms) -Controlled, not assessed -Current treatment  Omeprazole '20mg'$  BID - taking very rarely Appropriate, Effective, Safe, Accessible -Medications previously tried: none noted -She is taking very rarely, ok with this due to history of osteoporosis. -Patient probably needs updated DEXA scan last was in 2015.  -Recommended to continue current medication No changes at this time  Insomnia (Goal: Adequate sleep) -Controlled, not assessed -Current treatment  Zolpidem '10mg'$  one half tablet daily Appropriate, Effective, Safe, Accessible -Medications previously tried: none noted -She reports sleep is good ever since she started Ambien.  -Recommended to continue current medication No changes at this time.  Diarrhea (Goal: Reduce symptoms) 08/27/22 -Not ideally controlled -Current treatment  Viberzi '75mg'$  Appropriate, Effective, Safe, Accessible -Medications previously tried: none noted -She has to take something daily for her diarrhea.  She was able to get Viberzi approved so now she is taking less immodium. Work on dietary changes that do not cause spasms. No changes at this time, continue current meds.  Pulmonary Nodule/SOB (Goal: Improve breathing/symptoms) 08/27/22 -Controlled -Current treatment  Breo Ellipto 100/85mg 1 puff daily Appropriate, Effective, Safe, Query accessible -Medications previously tried: Symbicort, Advair, Spiriva  -Recommended to continue current medication - Recent scans show growing nodule in lungs of very small size.  She has PET scan on Monday.  Developed cough and SOB prior to a surgery and this is when it was discovered.   Will support where needed. She is applying for PAP through manufacturer for assistance and will need pulmonology to sign. She is aware of process.   Patient Goals/Self-Care Activities Patient will:  - take medications as prescribed as evidenced by patient report and record review collaborate with provider on medication access solutions target a minimum of 150 minutes of moderate intensity exercise weekly  Follow Up Plan: The care management team will reach out to the patient again over the next 180 days.           The patient verbalized understanding of instructions, educational materials, and care plan provided today and DECLINED offer to receive copy of patient instructions, educational materials, and care plan.  Telephone follow up appointment with pharmacy team member scheduled for: 6 months  CEdythe Clarity RRuby PharmD Clinical Pharmacist  [facility]([phone]

## 2022-08-30 ENCOUNTER — Encounter (HOSPITAL_COMMUNITY)
Admission: RE | Admit: 2022-08-30 | Discharge: 2022-08-30 | Disposition: A | Discharge status: Home / Self Care | Payer: Medicare Other | Source: Ambulatory Visit | Attending: Pulmonary Disease | Admitting: Pulmonary Disease

## 2022-08-30 ENCOUNTER — Encounter: Payer: Self-pay | Admitting: *Deleted

## 2022-08-30 DIAGNOSIS — R911 Solitary pulmonary nodule: Secondary | ICD-10-CM | POA: Diagnosis not present

## 2022-08-30 LAB — GLUCOSE, CAPILLARY: Glucose-Capillary: 108 mg/dL — ABNORMAL HIGH (ref 70–99)

## 2022-08-30 MED ORDER — FLUDEOXYGLUCOSE F - 18 (FDG) INJECTION
6.6000 | Freq: Once | INTRAVENOUS | Status: AC
  Administered 2022-08-30: 6.56 via INTRAVENOUS

## 2022-08-31 ENCOUNTER — Telehealth: Payer: Self-pay | Admitting: Pulmonary Disease

## 2022-09-01 NOTE — Telephone Encounter (Signed)
Patient calling back for PET results, patient is concerned with hot spots.

## 2022-09-03 ENCOUNTER — Other Ambulatory Visit: Payer: Self-pay

## 2022-09-03 DIAGNOSIS — Z122 Encounter for screening for malignant neoplasm of respiratory organs: Secondary | ICD-10-CM

## 2022-09-03 DIAGNOSIS — R911 Solitary pulmonary nodule: Secondary | ICD-10-CM

## 2022-09-03 NOTE — Telephone Encounter (Signed)
Dr Elsworth Soho already discussed results with patient. Nothing further needed

## 2022-09-07 ENCOUNTER — Encounter: Payer: Self-pay | Admitting: Internal Medicine

## 2022-09-07 ENCOUNTER — Ambulatory Visit (INDEPENDENT_AMBULATORY_CARE_PROVIDER_SITE_OTHER): Payer: Medicare Other | Admitting: Internal Medicine

## 2022-09-07 ENCOUNTER — Other Ambulatory Visit: Payer: Self-pay

## 2022-09-07 VITALS — BP 140/80 | HR 72 | Ht <= 58 in | Wt 132.2 lb

## 2022-09-07 DIAGNOSIS — I2510 Atherosclerotic heart disease of native coronary artery without angina pectoris: Secondary | ICD-10-CM | POA: Diagnosis not present

## 2022-09-07 DIAGNOSIS — R911 Solitary pulmonary nodule: Secondary | ICD-10-CM

## 2022-09-07 DIAGNOSIS — K580 Irritable bowel syndrome with diarrhea: Secondary | ICD-10-CM

## 2022-09-07 DIAGNOSIS — K6289 Other specified diseases of anus and rectum: Secondary | ICD-10-CM

## 2022-09-07 MED ORDER — VIBERZI 75 MG PO TABS: 1.0000 | ORAL_TABLET | Freq: Two times a day (BID) | ORAL | 1 refills | Status: DC

## 2022-09-07 MED ORDER — DICYCLOMINE HCL 10 MG PO CAPS: 10.0000 mg | ORAL_CAPSULE | Freq: Three times a day (TID) | ORAL | 3 refills | Status: DC | PRN

## 2022-09-07 NOTE — Progress Notes (Addendum)
Patient ID: Nichole Delgado, female   DOB: [birth year], 71 y.o.   MRN: [id number] HPI: Nichole Delgado is a 71 year old female with a past medical history of IBS-D, GERD, hypertension, DDD with complex regional pain syndrome with a spinal cord stimulator, history of anxiety who is seen to establish care.  Previously received GI care from Dr. Watt Climes.  She reports that she is switching care because she had a difficult time with communication with [facility] and getting her prescriptions on time.  She is used Viberzi to treat her IBS-D for the last 4 or 5 years.  She states that she has had IBS-D for 35 years.  Viberzi is definitively helpful and she only occasionally needs Imodium.  Even with therapy which she has resumed over the last month she will have 2-3 stools per day which are often loose.  She can have nocturnal crampy abdominal pain and rectal pain but not always diarrhea.  She takes ibuprofen typically 400 mg once to twice daily for crampy abdominal pain but also joint pains.  She previously tried Bentyl for crampy pain but this made her sleepy.  No blood in stool or melena.  She recalls her last colonoscopy in 2015 and is not sure when she is due for surveillance.  She denies issues with frequent heartburn, dysphagia or odynophagia.  She is not currently taking an antacid medication.  She had a recent PET scan to evaluate a lung nodule.  There was no lung PET avidity but some activity in the vocal cord.  This is being further evaluated now.  Past Medical History:  Diagnosis Date   Aortic atherosclerosis ([place])    Cervical spondylosis    Complex regional pain syndrome I    followed by pain clinic, pcp,  has spinal cord stimulator   DDD (degenerative disc disease), lumbar    GAD (generalized anxiety disorder)    GERD (gastroesophageal reflux disease)    History of basal cell carcinoma (BCC) excision    per pt moh's sx on nose in 2020   History of chronic bronchitis    uses inhaler prn    Hypertension    followed by pcp and dr hilty   Irritable bowel syndrome with diarrhea    followed by dr Watt Climes (gi)   Lesion of bladder    Migraines    neurology-- [facility] in [place]   Mixed dyslipidemia    followed by lipid clinic--- dr Debara Pickett   Neuropathy    OA (osteoarthritis)    PONV (postoperative nausea and vomiting)    severe   Pre-diabetes    S/P insertion of spinal 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   Stress incontinence    SUI (stress urinary incontinence, female)    Thyroid cyst    04-24-2021  pt stated recently dx and has been referred to endocrinologist   Vertigo    Wears glasses     Past Surgical History:  Procedure Laterality Date   BACK SURGERY     BLADDER SURGERY  05/12/2021   for stress incontinence per pt   CATARACT EXTRACTION W/ INTRAOCULAR LENS  IMPLANT, BILATERAL  2005   COLONOSCOPY W/ POLYPECTOMY     CYSTOSCOPY WITH BIOPSY N/A 05/12/2021   Procedure: CYSTOSCOPY WITH BIOPSY AND FULGERATION;  Surgeon: Robley Fries, MD;  Location: [place];  Service: Urology;  Laterality: N/A;  30 MINS   FOOT SURGERY Right 2011   hammer toe   ORIF ANKLE FRACTURE Left 03/27/2017   Procedure: OPEN  REDUCTION INTERNAL FIXATION (ORIF) BIMALLEOLAR ANKLE FRACTURE;  Surgeon: Renette Butters, MD;  Location: [place];  Service: Orthopedics;  Laterality: Left;   ORIF CALCANEOUS FRACTURE Right 04/05/2017   Procedure: OPEN REDUCTION INTERNAL FIXATION (ORIF) CALCANEOUS AND FIBULA FRACTURE;  Surgeon: Renette Butters, MD;  Location: [place];  Service: Orthopedics;  Laterality: Right;   POSTERIOR LUMBAR FUSION  09-08-2020  '@[facility]'$    L5--S1   SPINAL CORD STIMULATOR INSERTION  08/15/2018   SUPRACERVICAL ABDOMINAL HYSTERECTOMY  1988   TONSILLECTOMY AND ADENOIDECTOMY  child    Outpatient Medications Prior to Visit  Medication Sig Dispense Refill   albuterol (VENTOLIN HFA) 108 (90 Base) MCG/ACT inhaler Inhale 2 puffs into the lungs every 6 (six) hours as needed  for wheezing or shortness of breath. 6.7 g 2   Azelaic Acid 15 % gel Apply topically daily. After skin is thoroughly washed and patted dry, gently but thoroughly massage a thin film of azelaic acid cream into the affected area twice daily, in the morning and evening.     Ca Phosphate-Cholecalciferol 250-500 MG-UNIT CHEW Chew 1 each by mouth 2 (two) times daily.     Calcium Carb-Cholecalciferol (CALCIUM 500 +D PO) Take 1 tablet by mouth daily.     Cholecalciferol (VITAMIN D3) 50 MCG (2000 UT) TABS Take 1 tablet by mouth daily.     Eluxadoline (VIBERZI) 75 MG TABS Take 1 tablet by mouth 2 (two) times daily.     estradiol (CLIMARA - DOSED IN MG/24 HR) 0.1 mg/24hr patch Place 0.1 mg onto the skin 2 (two) times a week.     fluticasone (FLONASE) 50 MCG/ACT nasal spray Place into both nostrils daily.     fluticasone furoate-vilanterol (BREO ELLIPTA) 100-25 MCG/ACT AEPB Inhale 1 puff into the lungs daily. 60 each 5   fluticasone-salmeterol (ADVAIR HFA) 115-21 MCG/ACT inhaler Inhale 2 puffs into the lungs 2 (two) times daily. 1 each 12   ibuprofen (ADVIL,MOTRIN) 200 MG tablet Take 400 mg by mouth every 6 (six) hours as needed for mild pain.     loperamide (IMODIUM) 2 MG capsule Take 2 mg by mouth as needed.     losartan (COZAAR) 25 MG tablet Take 1 tablet (25 mg total) by mouth daily. 90 tablet 3   meclizine (ANTIVERT) 25 MG tablet Take 25 mg by mouth 3 (three) times daily as needed for dizziness.     ondansetron (ZOFRAN) 8 MG tablet Take by mouth every 8 (eight) hours as needed for nausea or vomiting.     perphenazine (TRILAFON) 2 MG tablet Take 2 mg by mouth at bedtime.     pravastatin (PRAVACHOL) 40 MG tablet TAKE 1 TABLET BY MOUTH AT BEDTIME 90 tablet 0   traMADol (ULTRAM) 50 MG tablet Take by mouth every 6 (six) hours as needed.     zolpidem (AMBIEN) 10 MG tablet Take 10 mg by mouth at bedtime as needed for sleep.     No facility-administered medications prior to visit.    Allergies  Allergen  Reactions   Penicillins Hives    Has patient had a PCN reaction causing immediate rash, facial/tongue/throat swelling, SOB or lightheadedness with hypotension: No Has patient had a PCN reaction causing severe rash involving mucus membranes or skin necrosis: Yes Has patient had a PCN reaction that required hospitalization No Has patient had a PCN reaction occurring within the last 10 years: No If all of the above answers are "NO", then may proceed with Cephalosporin use.    Sulfa Antibiotics  Hives and Rash   Codeine Nausea Only   Hydrocodone Rash   Keppra [Levetiracetam] Rash   Other Other (See Comments)    States she has allergies to other seizure medications but doesn't know which ones   Topamax [Topiramate] Nausea Only    Weight loss    Family History  Problem Relation Age of Onset   Heart attack Father    Migraines Mother    Cancer Sister        Pancreatic   Cancer Brother        Liver   Migraines Daughter    Breast cancer Neg Hx     Social History   Tobacco Use   Smoking status: Every Day    Packs/day: 1.00    Years: 50.00    Total pack years: 50.00    Types: Cigarettes   Smokeless tobacco: Never   Tobacco comments:    Vaping. ARJ 08/19/22   Vaping Use   Vaping Use: Never used  Substance Use Topics   Alcohol use: Not Currently    Alcohol/week: 0.0 standard drinks of alcohol   Drug use: Never    ROS: As per history of present illness, otherwise negative  Ht 4' 9.5" (1.461 m) Comment: height measured without shoes  Wt 132 lb 4 oz (60 kg)   BMI 28.12 kg/m  Gen: awake, alert, NAD HEENT: anicteric  CV: RRR, no mrg Pulm: CTA b/l Abd: soft, NT/ND, +BS throughout Ext: no c/c/e Neuro: nonfocal   RELEVANT LABS AND IMAGING: CBC    Component Value Date/Time   WBC 7.9 05/27/2022 1435   RBC 4.61 05/27/2022 1435   HGB 14.2 05/27/2022 1435   HCT 42.2 05/27/2022 1435   PLT 252.0 05/27/2022 1435   MCV 91.7 05/27/2022 1435   MCH 30.9 08/04/2018 0647   MCHC  33.6 05/27/2022 1435   RDW 13.4 05/27/2022 1435   LYMPHSABS 2.6 05/27/2022 1435   MONOABS 0.6 05/27/2022 1435   EOSABS 0.1 05/27/2022 1435   BASOSABS 0.1 05/27/2022 1435    CMP     Component Value Date/Time   NA 138 06/12/2021 1427   NA 141 06/12/2020 0000   K 3.9 06/12/2021 1427   CL 103 06/12/2021 1427   CO2 27 06/12/2021 1427   GLUCOSE 95 06/12/2021 1427   BUN 12 06/12/2021 1427   BUN 13 06/12/2020 0000   CREATININE 0.97 06/12/2021 1427   CALCIUM 10.2 06/12/2021 1427   PROT 6.4 03/31/2021 1516   PROT 6.5 07/07/2016 1016   ALBUMIN 4.4 06/12/2021 1427   ALBUMIN 4.5 07/07/2016 1016   AST 17 03/31/2021 1516   ALT 18 03/31/2021 1516   ALKPHOS 69 03/31/2021 1516   BILITOT 0.3 03/31/2021 1516   BILITOT 0.3 07/07/2016 1016   GFRNONAA 61 06/12/2020 0000   GFRAA 74 06/12/2020 0000   NUCLEAR MEDICINE PET SKULL BASE TO THIGH   TECHNIQUE: 6.6 mCi F-18 FDG was injected intravenously. Full-ring PET imaging was performed from the skull base to thigh after the radiotracer. CT data was obtained and used for attenuation correction and anatomic localization.   Fasting blood glucose: 180 mg/dl   COMPARISON:  Lung cancer screening CT 08/12/2022   FINDINGS: Mediastinal blood pool activity: SUV max 2.5   Liver activity: SUV max NA   NECK: Right vocal cord asymmetric hypermetabolism corresponds to possible soft tissue asymmetry including at a S.U.V. max of 5.5 on 39/4. No cervical nodal hypermetabolism.   Incidental CT findings: No cervical adenopathy. Left carotid atherosclerosis.  CHEST: No hypermetabolism to correspond to the previously described mixed attenuation left upper lobe pulmonary nodule. Example at 1.3 cm and a S.U.V. max of 1.5 on 51/4.   No thoracic nodal hypermetabolism.   Incidental CT findings: Deferred to recent diagnostic CT. Aortic and coronary artery calcification. Mild centrilobular emphysema.   ABDOMEN/PELVIS: No abdominopelvic parenchymal or  nodal hypermetabolism.   Incidental CT findings: Left adrenal thickening. Mildly malrotated bilateral kidneys. Possible subcentimeter pericholecystic hepatic cyst versus area of focal steatosis. Abdominal aortic atherosclerosis. Pelvic floor laxity. At least partial hysterectomy.   SKELETON: No abnormal marrow activity.   Incidental CT findings: Lumbosacral spine fixation. Dorsal spinal stimulator.   IMPRESSION: 1. No hypermetabolism to correspond to the previously described mixed attenuation posterior left upper lobe pulmonary nodule. As low-grade adenocarcinoma can be non FDG avid, recommend chest CT at 3 months. 2. Mild hypermetabolism corresponding to possible soft tissue asymmetry within the right vocal cord. Consider direct visualization. 3. Incidental findings, including: Aortic atherosclerosis (ICD10-I70.0), coronary artery atherosclerosis and emphysema (ICD10-J43.9). Pelvic floor laxity.     Electronically Signed   By: Abigail Miyamoto M.D.   On: 08/30/2022 13:31  ASSESSMENT/PLAN: 71 year old female with a past medical history of IBS-D, GERD, hypertension, DDD with complex regional pain syndrome with a spinal cord stimulator, history of anxiety who is seen to establish care.  IBS-D/rec pain question of proctalgia fugax--longstanding IBS-D on longstanding Viberzi.  She wishes to continue this therapy. -- Viberzi 75 mg twice daily -- Loperamide 2 mg up to 3 times a day as needed for breakthrough diarrhea -- Trial of Bentyl 10 to 20 mg nightly to help with nocturnal crampy abdominal pain and rectal pain.  This medication could be used up to 3 times daily on an as-needed basis for the same symptoms.  2.  CRC screening --we will request records from [facility] to better determine when she will be due for colonoscopy screening/surveillance -- Records received from [facility].  The patient had a complete colonoscopy with adequate preparation on 08/28/2014.  Only a small hyperplastic  subcentimeter sigmoid polyp removed. -- Recall colonoscopy for screening September 2025.  3.  Lung nodule/vocal cord activity by PET --she is being evaluated with pulmonary and ENT. No specific GI concerning findings by CT PET scan though pelvic floor laxity was noted.      YM:EBRAXE, [place], [address] [place],  [place] [id number]

## 2022-09-07 NOTE — Patient Instructions (Signed)
_______________________________________________________  If you are age 73 or older, your body mass index should be between 23-30. Your Body mass index is 28.12 kg/m. If this is out of the aforementioned range listed, please consider follow up with your Primary Care Provider.  If you are age 67 or younger, your body mass index should be between 19-25. Your Body mass index is 28.12 kg/m. If this is out of the aformentioned range listed, please consider follow up with your Primary Care Provider.   ________________________________________________________  The [facility] providers would like to encourage you to use [facility] to communicate with providers for non-urgent requests or questions.  Due to long hold times on the telephone, sending your provider a message by [facility] may be a faster and more efficient way to get a response.  Please allow 48 business hours for a response.  Please remember that this is for non-urgent requests.  _______________________________________________________  We have sent the following medications to your pharmacy for you to pick up at your convenience:  Viberzi, Bentyl

## 2022-09-09 DIAGNOSIS — R9389 Abnormal findings on diagnostic imaging of other specified body structures: Secondary | ICD-10-CM | POA: Diagnosis not present

## 2022-09-13 ENCOUNTER — Other Ambulatory Visit (HOSPITAL_COMMUNITY): Payer: Self-pay

## 2022-09-13 ENCOUNTER — Encounter: Payer: Self-pay | Admitting: *Deleted

## 2022-09-14 ENCOUNTER — Telehealth: Payer: Self-pay

## 2022-09-14 ENCOUNTER — Other Ambulatory Visit (HOSPITAL_COMMUNITY): Payer: Self-pay

## 2022-09-14 NOTE — Telephone Encounter (Signed)
Patient Advocate Encounter   Received notification from [facility] that prior authorization is required for Viberzi '75MG'$  tablets  Submitted: 09-14-2022 Key [id number]  Status is pending

## 2022-09-16 ENCOUNTER — Telehealth: Payer: Self-pay | Admitting: *Deleted

## 2022-09-16 ENCOUNTER — Ambulatory Visit: Payer: Medicare Other | Admitting: Internal Medicine

## 2022-09-16 NOTE — Telephone Encounter (Signed)
Left message to return call to our office at their convenience.   Patient need OV for surgery clearance

## 2022-09-17 ENCOUNTER — Other Ambulatory Visit (HOSPITAL_COMMUNITY): Payer: Self-pay

## 2022-09-20 ENCOUNTER — Telehealth: Payer: Self-pay | Admitting: Pulmonary Disease

## 2022-09-20 NOTE — Telephone Encounter (Signed)
Patient Advocate Encounter  Received notification from [facility] that the request for prior authorization for Viberzi has been denied due to the pt being in the patient assistance program.     Called & spoke with the pt. She had been receiving the med from the manufacturer, but didn't receive it for about 7 months. She discovered it was due to something not being don correctly, but it was when she was seeing her previous office. She received the medication recently and has enough to last through the end of the year. She also has the paperwork to fill out to get it re-approved in the new year.

## 2022-09-20 NOTE — Telephone Encounter (Signed)
Fax received from Dr. Lattie Corns with [facility] to perform a RIGHT FOOT DEEP ORTHOPEDIC HARDWARE REMOVAL on patient.  Patient needs surgery clearance. Surgery is 09/28/2022. Patient was seen on 08/18/2022. Office protocol is a risk assessment can be sent to surgeon if patient has been seen in 60 days or less.   Sending to Dr Elsworth Soho for risk assessment or recommendations if patient needs to be seen in office prior to surgical procedure.

## 2022-09-20 NOTE — Telephone Encounter (Signed)
OV notes and clearance form have been faxed back to [facility]. Nothing further needed at this time.  

## 2022-09-21 ENCOUNTER — Telehealth: Payer: Self-pay | Admitting: Pulmonary Disease

## 2022-09-21 NOTE — Telephone Encounter (Signed)
Caller was Katharine Look from [employer] states: -She was calling to check status of surgical clearance form   Informed caller of message below. Caller verbalized understanding. States she will also call patient to have her schedule OV.

## 2022-09-22 MED ORDER — FLUTICASONE FUROATE-VILANTEROL 100-25 MCG/ACT IN AEPB: 1.0000 | INHALATION_SPRAY | Freq: Every day | RESPIRATORY_TRACT | 3 refills | Status: DC

## 2022-09-22 NOTE — Telephone Encounter (Signed)
Spoke with the pt  She is needing rx for [facility] faxed to [place]  Rx was faxed  Nothing further needed

## 2022-09-23 ENCOUNTER — Ambulatory Visit (INDEPENDENT_AMBULATORY_CARE_PROVIDER_SITE_OTHER): Payer: Medicare Other | Admitting: Family Medicine

## 2022-09-23 ENCOUNTER — Encounter: Payer: Self-pay | Admitting: Family Medicine

## 2022-09-23 VITALS — BP 130/72 | HR 71 | Temp 98.0°F | Ht <= 58 in | Wt 131.8 lb

## 2022-09-23 DIAGNOSIS — Z23 Encounter for immunization: Secondary | ICD-10-CM

## 2022-09-23 DIAGNOSIS — I10 Essential (primary) hypertension: Secondary | ICD-10-CM

## 2022-09-23 DIAGNOSIS — M1990 Unspecified osteoarthritis, unspecified site: Secondary | ICD-10-CM | POA: Diagnosis not present

## 2022-09-23 DIAGNOSIS — Z9689 Presence of other specified functional implants: Secondary | ICD-10-CM

## 2022-09-23 DIAGNOSIS — E785 Hyperlipidemia, unspecified: Secondary | ICD-10-CM

## 2022-09-23 DIAGNOSIS — J410 Simple chronic bronchitis: Secondary | ICD-10-CM

## 2022-09-23 DIAGNOSIS — G5770 Causalgia of unspecified lower limb: Secondary | ICD-10-CM | POA: Diagnosis not present

## 2022-09-23 NOTE — Assessment & Plan Note (Signed)
At goal today on losartan 25 mg daily.

## 2022-09-23 NOTE — Assessment & Plan Note (Signed)
Continue management per orthopedics. 

## 2022-09-23 NOTE — Assessment & Plan Note (Signed)
Follows with pulmonology.  She has already been cleared for surgery from them.

## 2022-09-23 NOTE — Patient Instructions (Signed)
It was very nice to see you today!  We will clear you for surgery.   Please come back soon for your annual physical.  Take care, Dr Jerline Pain  PLEASE NOTE:  If you had any lab tests please let us know if you have not heard back within a few days. You may see your results on mychart before we have a chance to review them but we will give you a call once they are reviewed by [place]. If we ordered any referrals today, please let us know if you have not heard from their office within the next week.   Please try these tips to maintain a healthy lifestyle:  Eat at least 3 REAL meals and 1-2 snacks per day.  Aim for no more than 5 hours between eating.  If you eat breakfast, please do so within one hour of getting up.   Each meal should contain half fruits/vegetables, one quarter protein, and one quarter carbs (no bigger than a computer mouse)  Cut down on sweet beverages. This includes juice, soda, and sweet tea.   Drink at least 1 glass of water with each meal and aim for at least 8 glasses per day  Exercise at least 150 minutes every week.

## 2022-09-23 NOTE — Assessment & Plan Note (Signed)
She is currently trying to find a new pain management specialist.  She has a Chemical engineer spinal cord stimulator in place and needs adjustment every few months.  She will discuss this with her back surgeon but will let us know if she needs a referral.

## 2022-09-23 NOTE — Progress Notes (Signed)
Chief Complaint:  Nichole Delgado is a 71 y.o. female who presents today for consultation for surgical clearance at the request of Dr. Lucia Gaskins.  Assessment/Plan:  Encounter For Surgical Clearance Patient overall low risk for complication from surgery.  She has already been cleared by pulmonology.  We will complete surgical clearance form and fax over this requesting surgeon's office.  Chronic Problems Addressed Today: Essential hypertension At goal today on losartan 25 mg daily.  Chronic bronchitis ([place]) Follows with pulmonology.  She has already been cleared for surgery from them.  Spinal cord stimulator status She is currently trying to find a new pain management specialist.  She has a Chemical engineer spinal cord stimulator in place and needs adjustment every few months.  She will discuss this with her back surgeon but will let us know if she needs a referral.  Osteoarthritis Continue management per orthopedics.  Dyslipidemia Continue pravastatin 40 mg daily.  We will check lipids with her next blood draw.  Flu shot given today.    Subjective:  HPI:  Patient here today for surgical clearance.  She will be undergoing right ankle deep orthopedic hardware removal.  She has been following with orthopedics for about a year. There is concern that the hardware she is is causing significant pain.   She is been recently following with pulmonology due to concern for pulmonary nodule.  Instantly found to have concerning laryngeal nodule.  Underwent direct visualization with ENT and they did not see anything.  She will be having repeat CT scan for pulmonary in a few months.  ROS: Per HPI, otherwise a complete review of systems was negative.   PMH:  The following were reviewed and entered/updated in epic: Past Medical History:  Diagnosis Date   Aortic atherosclerosis (HCC)    Asthma    Cervical spondylosis    Colon polyps    Complex regional pain syndrome I    followed by  pain clinic, pcp,  has spinal cord stimulator   DDD (degenerative disc disease), lumbar    GAD (generalized anxiety disorder)    GERD (gastroesophageal reflux disease)    History of basal cell carcinoma (BCC) excision    per pt moh's sx on nose in 2020   History of chronic bronchitis    uses inhaler prn   Hypertension    followed by pcp and dr hilty   Irritable bowel syndrome with diarrhea    followed by dr Watt Climes (gi)   Lesion of bladder    Migraines    neurology-- [facility] in [place]   Mixed dyslipidemia    followed by lipid clinic--- dr Debara Pickett   Neuropathy    OA (osteoarthritis)    PONV (postoperative nausea and vomiting)    severe   Pre-diabetes    S/P insertion of spinal 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   Stress incontinence    SUI (stress urinary incontinence, female)    Thyroid cyst    04-24-2021  pt stated recently dx and has been referred to endocrinologist   Vertigo    Wears glasses    Patient Active Problem List   Diagnosis Date Noted   Pulmonary nodule less than 1 cm in diameter with moderate to high risk for malignant neoplasm 08/18/2022   Moderate persistent asthma 05/27/2022   Tobacco abuse 05/27/2022   Female stress incontinence 11/26/2020   CRPS (complex regional pain syndrome), lower limb with spinal cord simulator 11/26/2020   Essential hypertension 11/26/2020   Chronic bronchitis ([place]) 11/26/2020  Emotional disorder 11/26/2020   Menopausal symptoms 11/26/2020   Migraine 11/26/2020   Basal cell carcinoma (BCC) 11/26/2020   Other spondylosis with myelopathy, lumbar region 09/09/2020   Spinal cord stimulator status 06/18/2020   Pain in left foot 02/13/2020   Chronic pain 11/06/2019   Hyperlipidemia, unspecified 11/06/2019   Peripheral neuropathic pain 09/27/2017   DDD (degenerative disc disease), lumbosacral    Left foot drop    GERD (gastroesophageal reflux disease) 03/26/2017   Dyslipidemia    Nicotine dependence with current use     IBS (irritable bowel syndrome)    Osteoporosis 02/24/2016   Osteoarthritis 02/24/2016   Insomnia 02/24/2016   Past Surgical History:  Procedure Laterality Date   BLADDER SURGERY  05/12/2021   for stress incontinence per pt   CATARACT EXTRACTION W/ INTRAOCULAR LENS  IMPLANT, BILATERAL  2005   COLONOSCOPY W/ POLYPECTOMY     CYSTOSCOPY WITH BIOPSY N/A 05/12/2021   Procedure: CYSTOSCOPY WITH BIOPSY AND FULGERATION;  Surgeon: Robley Fries, MD;  Location: [facility];  Service: Urology;  Laterality: N/A;  30 MINS   FOOT SURGERY Right 2011   hammer toe   ORIF ANKLE FRACTURE Left 03/27/2017   Procedure: OPEN REDUCTION INTERNAL FIXATION (ORIF) BIMALLEOLAR ANKLE FRACTURE;  Surgeon: Renette Butters, MD;  Location: [facility];  Service: Orthopedics;  Laterality: Left;   ORIF CALCANEOUS FRACTURE Right 04/05/2017   Procedure: OPEN REDUCTION INTERNAL FIXATION (ORIF) CALCANEOUS AND FIBULA FRACTURE;  Surgeon: Renette Butters, MD;  Location: [facility];  Service: Orthopedics;  Laterality: Right;   POSTERIOR LUMBAR FUSION  09-08-2020  '@[facility]'$    L5--S1   SPINAL CORD STIMULATOR INSERTION  08/15/2018   SUPRACERVICAL ABDOMINAL HYSTERECTOMY  1988   Partial   TONSILLECTOMY AND ADENOIDECTOMY  child    Family History  Problem Relation Age of Onset   Migraines Mother    Hypertension Mother    Hyperlipidemia Mother    Heart attack Father    Hypertension Father    Hyperlipidemia Father    Pancreatic cancer Sister    Ulcerative colitis Sister    Liver cancer Brother    Migraines Daughter    Vascular Disease Daughter    Hypertension Daughter    Hyperlipidemia Daughter    Breast cancer Cousin    Crohn's disease Nephew     Medications- reviewed and updated Current Outpatient Medications  Medication Sig Dispense Refill   albuterol (VENTOLIN HFA) 108 (90 Base) MCG/ACT inhaler Inhale 2 puffs into the lungs every 6 (six) hours as needed for wheezing or shortness of breath. 6.7 g 2   Ascorbic  Acid (VITAMIN C) 500 MG CAPS Take 1 tablet by mouth daily.     Azelaic Acid 15 % gel Apply 1 Application topically daily.     Ca Phosphate-Cholecalciferol 250-500 MG-UNIT CHEW Chew 1 each by mouth 2 (two) times daily.     Calcium Carb-Cholecalciferol (CALCIUM 500 +D PO) Take 1 tablet by mouth daily.     Cholecalciferol (VITAMIN D3) 50 MCG (2000 UT) TABS Take 1 tablet by mouth daily.     dicyclomine (BENTYL) 10 MG capsule Take 1-2 capsules (10-20 mg total) by mouth 3 (three) times daily as needed for spasms. 90 capsule 3   Eluxadoline (VIBERZI) 75 MG TABS Take 1 tablet by mouth 2 (two) times daily. 180 tablet 1   estradiol (CLIMARA - DOSED IN MG/24 HR) 0.1 mg/24hr patch Place 0.1 mg onto the skin 2 (two) times a week.     fluticasone (  FLONASE) 50 MCG/ACT nasal spray Place into both nostrils daily.     fluticasone furoate-vilanterol (BREO ELLIPTA) 100-25 MCG/ACT AEPB Inhale 1 puff into the lungs daily. 180 each 3   ibuprofen (ADVIL,MOTRIN) 200 MG tablet Take 400 mg by mouth every 6 (six) hours as needed for mild pain.     loperamide (IMODIUM) 2 MG capsule Take 2 mg by mouth as needed.     losartan (COZAAR) 25 MG tablet Take 1 tablet (25 mg total) by mouth daily. 90 tablet 3   meclizine (ANTIVERT) 25 MG tablet Take 25 mg by mouth 3 (three) times daily as needed for dizziness.     ondansetron (ZOFRAN) 8 MG tablet Take by mouth every 8 (eight) hours as needed for nausea or vomiting.     perphenazine (TRILAFON) 2 MG tablet Take 2 mg by mouth at bedtime.     pravastatin (PRAVACHOL) 40 MG tablet TAKE 1 TABLET BY MOUTH AT BEDTIME 90 tablet 0   Sugammadex Sodium (BRIDION IV) Inject into the vein.     traMADol (ULTRAM) 50 MG tablet Take by mouth every 6 (six) hours as needed.     zolpidem (AMBIEN) 10 MG tablet Take 10 mg by mouth at bedtime as needed for sleep.     No current facility-administered medications for this visit.    Allergies-reviewed and updated Allergies  Allergen Reactions    Penicillins Hives    Has patient had a PCN reaction causing immediate rash, facial/tongue/throat swelling, SOB or lightheadedness with hypotension: No Has patient had a PCN reaction causing severe rash involving mucus membranes or skin necrosis: Yes Has patient had a PCN reaction that required hospitalization No Has patient had a PCN reaction occurring within the last 10 years: No If all of the above answers are "NO", then may proceed with Cephalosporin use.    Sulfa Antibiotics Hives and Rash   Budesonide-Formoterol Fumarate     Other reaction(s): covid symptoms   Fluticasone-Salmeterol     Other reaction(s): covid symptoms   Codeine Nausea Only   Hydrocodone Rash   Keppra [Levetiracetam] Rash   Other Other (See Comments)    States she has allergies to other seizure medications but doesn't know which ones   Topamax [Topiramate] Nausea Only    Weight loss    Social History   Socioeconomic History   Marital status: Married    Spouse name: Not on file   Number of children: 1   Years of education: 12   Highest education level: Not on file  Occupational History   Occupation: retired  Tobacco Use   Smoking status: Former    Packs/day: 1.00    Years: 50.00    Total pack years: 50.00    Types: Cigarettes    Quit date: 08/24/2022    Years since quitting: 0.0   Smokeless tobacco: Never   Tobacco comments:    Vaping. [facility]J 08/19/22   Vaping Use   Vaping Use: Every day  Substance and Sexual Activity   Alcohol use: Not Currently    Alcohol/week: 0.0 standard drinks of alcohol   Drug use: Never   Sexual activity: Not on file  Other Topics Concern   Not on file  Social History Narrative   Lives at home with her husband   Right-handed   Drinks about 5 cups of coffee per day   Social Determinants of Health   Financial Resource Strain: Low Risk  (08/09/2022)   Overall Financial Resource Strain (CARDIA)    Difficulty  of Paying Living Expenses: Not hard at all  Food Insecurity: No  Food Insecurity (08/09/2022)   Hunger Vital Sign    Worried About Running Out of Food in the Last Year: Never true    Ran Out of Food in the Last Year: Never true  Transportation Needs: No Transportation Needs (08/09/2022)   PRAPARE - Hydrologist (Medical): No    Lack of Transportation (Non-Medical): No  Physical Activity: Unknown (08/09/2022)   Exercise Vital Sign    Days of Exercise per Week: 0 days    Minutes of Exercise per Session: Not on file  Stress: No Stress Concern Present (08/09/2022)   [place]    Feeling of Stress : Not at all  Social Connections: Moderately Isolated (08/09/2022)   Social Connection and Isolation Panel [NHANES]    Frequency of Communication with Friends and Family: More than three times a week    Frequency of Social Gatherings with Friends and Family: More than three times a week    Attends Religious Services: Never    Marine scientist or Organizations: No    Attends Music therapist: Never    Marital Status: Married         Objective:  Physical Exam: BP 130/72   Pulse 71   Temp 98 F (36.7 C) (Temporal)   Ht 4' 9.5" (1.461 m)   Wt 131 lb 12.8 oz (59.8 kg)   SpO2 96%   BMI 28.03 kg/m   Gen: NAD, resting comfortably CV: Regular rate and rhythm with no murmurs appreciated Pulm: Normal work of breathing, clear to auscultation bilaterally with no crackles, wheezes, or rhonchi GI: Normal bowel sounds present. Soft, Nontender, Nondistended. MSK: No edema, cyanosis, or clubbing noted Skin: Warm, dry Neuro: Grossly normal, moves all extremities Psych: Normal affect and thought content      A copy of this note will be forwarded to the requesting physician.   Algis Greenhouse. Jerline Pain, MD 09/23/2022 11:47 AM

## 2022-09-23 NOTE — Assessment & Plan Note (Signed)
Continue pravastatin 40 mg daily.  We will check lipids with her next blood draw.

## 2022-09-27 DIAGNOSIS — M79671 Pain in right foot: Secondary | ICD-10-CM | POA: Diagnosis not present

## 2022-09-27 DIAGNOSIS — M79672 Pain in left foot: Secondary | ICD-10-CM | POA: Diagnosis not present

## 2022-09-27 DIAGNOSIS — Z79891 Long term (current) use of opiate analgesic: Secondary | ICD-10-CM | POA: Diagnosis not present

## 2022-09-28 DIAGNOSIS — T8484XA Pain due to internal orthopedic prosthetic devices, implants and grafts, initial encounter: Secondary | ICD-10-CM | POA: Diagnosis not present

## 2022-09-28 DIAGNOSIS — G8918 Other acute postprocedural pain: Secondary | ICD-10-CM | POA: Diagnosis not present

## 2022-10-13 DIAGNOSIS — M19071 Primary osteoarthritis, right ankle and foot: Secondary | ICD-10-CM | POA: Diagnosis not present

## 2022-10-17 ENCOUNTER — Other Ambulatory Visit: Payer: Self-pay

## 2022-10-17 ENCOUNTER — Emergency Department (HOSPITAL_BASED_OUTPATIENT_CLINIC_OR_DEPARTMENT_OTHER): Payer: Medicare Other

## 2022-10-17 ENCOUNTER — Encounter (HOSPITAL_BASED_OUTPATIENT_CLINIC_OR_DEPARTMENT_OTHER): Payer: Self-pay | Admitting: Emergency Medicine

## 2022-10-17 ENCOUNTER — Emergency Department (HOSPITAL_BASED_OUTPATIENT_CLINIC_OR_DEPARTMENT_OTHER)
Admission: EM | Admit: 2022-10-17 | Discharge: 2022-10-17 | Disposition: A | Discharge status: Home / Self Care | Payer: Medicare Other | Attending: Emergency Medicine | Admitting: Emergency Medicine

## 2022-10-17 DIAGNOSIS — I10 Essential (primary) hypertension: Secondary | ICD-10-CM | POA: Insufficient documentation

## 2022-10-17 DIAGNOSIS — M25571 Pain in right ankle and joints of right foot: Secondary | ICD-10-CM

## 2022-10-17 DIAGNOSIS — Z79899 Other long term (current) drug therapy: Secondary | ICD-10-CM | POA: Diagnosis not present

## 2022-10-17 MED ORDER — OXYCODONE HCL 5 MG PO TABS: 5.0000 mg | ORAL_TABLET | ORAL | 0 refills | Status: AC | PRN

## 2022-10-17 NOTE — ED Triage Notes (Addendum)
Pt reports had surg 2 wks ago to remove the hardware from her ankle and heel; c/o pain to ankle since Fri; swelling noted; had oxycodone at 8p

## 2022-10-17 NOTE — ED Provider Notes (Signed)
[facility] Provider Note   CSN: [id number] Arrival date & time: 10/17/22  2129     History Past medical history chronic pain status post spinal cord stimulator, anxiety, hypertension, hyperlipidemia, on chronic opioids, recent ankle surgery by Dr. Lucia Gaskins Chief Complaint  Patient presents with   Ankle Pain    Nichole Delgado is a 71 y.o. female.   Patient presents to the ED with right ankle pain.  She says she had removal of her hardware on her right ankle approximately 2 weeks ago.  She says she had been doing all right and was taking her home tramadol with good pain relief.  On Friday she says she took a shower and washed the incision site with soap and water.  Ever since then she started noticing some shooting pains going up into her right calf that she feels like is nerve pain.  She says she has a spinal cord stimulator that has not helped at all.  She says that she switched over to 5 mg of oxycodone this weekend and it has been helping her pain but she is really concerned that she could have run out of medication.  She tried to call her pain management doctor and could not get in touch with them today.  She has not spoken with orthopedic doctor.  Ankle Pain      Home Medications Prior to Admission medications   Medication Sig Start Date End Date Taking? Authorizing Provider  oxyCODONE (ROXICODONE) 5 MG immediate release tablet Take 1 tablet (5 mg total) by mouth every 4 (four) hours as needed for up to 3 days for severe pain or breakthrough pain. 10/17/22 10/20/22 Yes Surabhi Gadea, Adora Fridge, PA-C  albuterol (VENTOLIN HFA) 108 (90 Base) MCG/ACT inhaler Inhale 2 puffs into the lungs every 6 (six) hours as needed for wheezing or shortness of breath. 07/14/20   Charlesetta Shanks, MD  Ascorbic Acid (VITAMIN C) 500 MG CAPS Take 1 tablet by mouth daily. 05/28/20   [provider]  Azelaic Acid 15 % gel Apply 1 Application topically daily. 06/11/21 12/23/22   [provider]  Ca Phosphate-Cholecalciferol 250-500 MG-UNIT CHEW Chew 1 each by mouth 2 (two) times daily.    [provider]  Calcium Carb-Cholecalciferol (CALCIUM 500 +D PO) Take 1 tablet by mouth daily.    [provider]  Cholecalciferol (VITAMIN D3) 50 MCG (2000 UT) TABS Take 1 tablet by mouth daily.    [provider]  dicyclomine (BENTYL) 10 MG capsule Take 1-2 capsules (10-20 mg total) by mouth 3 (three) times daily as needed for spasms. 09/07/22   Pyrtle, Lajuan Lines, MD  Eluxadoline (VIBERZI) 75 MG TABS Take 1 tablet by mouth 2 (two) times daily. 09/07/22   Pyrtle, Lajuan Lines, MD  estradiol (CLIMARA - DOSED IN MG/24 HR) 0.1 mg/24hr patch Place 0.1 mg onto the skin 2 (two) times a week.    [provider]  fluticasone (FLONASE) 50 MCG/ACT nasal spray Place into both nostrils daily.    [provider]  fluticasone furoate-vilanterol (BREO ELLIPTA) 100-25 MCG/ACT AEPB Inhale 1 puff into the lungs daily. 09/22/22   Rigoberto Noel, MD  ibuprofen (ADVIL,MOTRIN) 200 MG tablet Take 400 mg by mouth every 6 (six) hours as needed for mild pain.    [provider]  loperamide (IMODIUM) 2 MG capsule Take 2 mg by mouth as needed.    [provider]  losartan (COZAAR) 25 MG tablet Take 1 tablet (25  mg total) by mouth daily. 08/03/22   Hilty, Nadean Corwin, MD  meclizine (ANTIVERT) 25 MG tablet Take 25 mg by mouth 3 (three) times daily as needed for dizziness.    [provider]  ondansetron (ZOFRAN) 8 MG tablet Take by mouth every 8 (eight) hours as needed for nausea or vomiting.    [provider]  perphenazine (TRILAFON) 2 MG tablet Take 2 mg by mouth at bedtime.    [provider]  pravastatin (PRAVACHOL) 40 MG tablet TAKE 1 TABLET BY MOUTH AT BEDTIME 08/07/22   Vivi Barrack, MD  Sugammadex Sodium (BRIDION IV) Inject into the vein.    [provider]  traMADol (ULTRAM) 50 MG tablet Take by mouth every 6 (six)  hours as needed.    [provider]  zolpidem (AMBIEN) 10 MG tablet Take 10 mg by mouth at bedtime as needed for sleep.    [provider]      Allergies    Penicillins, Sulfa antibiotics, Budesonide-formoterol fumarate, Fluticasone-salmeterol, Codeine, Hydrocodone, Keppra [levetiracetam], Other, and Topamax [topiramate]    Review of Systems   Review of Systems  Musculoskeletal:  Positive for arthralgias.  All other systems reviewed and are negative.   Physical Exam Updated Vital Signs BP 134/86   Pulse 88   Temp 98.1 F (36.7 C) (Oral)   Resp 20   Ht 4' 10.5" (1.486 m)   Wt 59.9 kg   SpO2 96%   BMI 27.12 kg/m  Physical Exam Vitals and nursing note reviewed.  Constitutional:      General: She is not in acute distress.    Appearance: She is not toxic-appearing.  HENT:     Head: Normocephalic and atraumatic.  Eyes:     General: No scleral icterus.       Right eye: No discharge.        Left eye: No discharge.  Pulmonary:     Effort: No respiratory distress.  Musculoskeletal:     Comments: Right ankle without significant swelling, there is a recent well healing surgical incision without any abnormal drainage, erythema. Sensation intact. Pedal pulses intact. No calf tenderness or swelling of LE   Neurological:     Mental Status: She is alert.  Psychiatric:        Mood and Affect: Mood normal.        Behavior: Behavior normal.     ED Results / Procedures / Treatments   Labs (all labs ordered are listed, but only abnormal results are displayed) Labs Reviewed - No data to display  EKG None  Radiology DG Ankle Complete Right  Result Date: 10/17/2022 CLINICAL DATA:  Right ankle pain and swelling. Surgery 2 weeks ago for hardware removal from ankle and heel. EXAM: RIGHT ANKLE - COMPLETE 3+ VIEW COMPARISON:  06/26/2021. FINDINGS: No acute fracture or dislocation. No periosteal elevation or bony erosion. Stable fixation plate and screws are noted in  the distal fibula, without evidence of hardware loosening. Interval removal of hardware at calcaneus. Small calcaneal spur. Mild degenerative changes are noted in the midfoot. AK wires noted at the first metatarsal. Soft tissue swelling is present about the ankle and heel. IMPRESSION: 1. Soft tissue swelling at the ankle and heel without evidence of acute osseous abnormality. 2. Stable fixation hardware in the distal fibula and first metatarsal. Electronically Signed   By: Brett Fairy M.D.   On: 10/17/2022 22:08    Procedures Procedures   Medications Ordered in ED Medications - No  data to display  ED Course/ Medical Decision Making/ A&P                           Medical Decision Making Amount and/or Complexity of Data Reviewed Radiology: ordered.  Risk Prescription drug management.   Patient here with acute pain from recent ankle surgery. She is running out of home Oxycodone. Joint does not appear septic or infected. Neurovascular intact. Vitals are normal.  We obtained XR which does not appear to have any acute changes. I have refilled home Oxycodone. She is advised to call orthopedic doctor and pain management doctor first thing tomorrow morning.   Final Clinical Impression(s) / ED Diagnoses Final diagnoses:  Acute right ankle pain    Rx / DC Orders ED Discharge Orders          Ordered    oxyCODONE (ROXICODONE) 5 MG immediate release tablet  Every 4 hours PRN        10/17/22 2215              Adolphus Birchwood, PA-C 10/17/22 [place], [place], DO 10/17/22 2233

## 2022-10-17 NOTE — Discharge Instructions (Addendum)
Your x ray was reassuring today. I have refilled your oxycodone for an additional three day supply. Please take this as prescribed. If you notice any confusion or decreased respiratory drive, please return to the ED. Please call your Orthopedic and pain doctor first thing in the morning to have follow up appointment.

## 2022-10-21 DIAGNOSIS — M79671 Pain in right foot: Secondary | ICD-10-CM | POA: Diagnosis not present

## 2022-10-21 DIAGNOSIS — G9050 Complex regional pain syndrome I, unspecified: Secondary | ICD-10-CM | POA: Diagnosis not present

## 2022-10-21 DIAGNOSIS — G8929 Other chronic pain: Secondary | ICD-10-CM | POA: Diagnosis not present

## 2022-11-08 ENCOUNTER — Other Ambulatory Visit: Payer: Self-pay | Admitting: Family Medicine

## 2022-11-10 DIAGNOSIS — G5731 Lesion of lateral popliteal nerve, right lower limb: Secondary | ICD-10-CM | POA: Diagnosis not present

## 2022-11-29 DIAGNOSIS — M79671 Pain in right foot: Secondary | ICD-10-CM | POA: Diagnosis not present

## 2022-11-29 DIAGNOSIS — G9050 Complex regional pain syndrome I, unspecified: Secondary | ICD-10-CM | POA: Diagnosis not present

## 2022-11-29 DIAGNOSIS — G8929 Other chronic pain: Secondary | ICD-10-CM | POA: Diagnosis not present

## 2022-12-10 ENCOUNTER — Ambulatory Visit (HOSPITAL_BASED_OUTPATIENT_CLINIC_OR_DEPARTMENT_OTHER)
Admission: RE | Admit: 2022-12-10 | Discharge: 2022-12-10 | Disposition: A | Discharge status: Home / Self Care | Payer: Medicare Other | Source: Ambulatory Visit | Attending: Pulmonary Disease | Admitting: Pulmonary Disease

## 2022-12-10 ENCOUNTER — Ambulatory Visit (HOSPITAL_BASED_OUTPATIENT_CLINIC_OR_DEPARTMENT_OTHER): Payer: Medicare Other

## 2022-12-10 DIAGNOSIS — R911 Solitary pulmonary nodule: Secondary | ICD-10-CM | POA: Insufficient documentation

## 2022-12-15 DIAGNOSIS — N393 Stress incontinence (female) (male): Secondary | ICD-10-CM | POA: Diagnosis not present

## 2022-12-15 DIAGNOSIS — D414 Neoplasm of uncertain behavior of bladder: Secondary | ICD-10-CM | POA: Diagnosis not present

## 2022-12-22 DIAGNOSIS — M25571 Pain in right ankle and joints of right foot: Secondary | ICD-10-CM | POA: Diagnosis not present

## 2022-12-22 DIAGNOSIS — M25572 Pain in left ankle and joints of left foot: Secondary | ICD-10-CM | POA: Diagnosis not present

## 2022-12-23 DIAGNOSIS — F250 Schizoaffective disorder, bipolar type: Secondary | ICD-10-CM | POA: Diagnosis not present

## 2022-12-24 ENCOUNTER — Other Ambulatory Visit: Payer: Self-pay | Admitting: Urology

## 2022-12-27 ENCOUNTER — Encounter (HOSPITAL_BASED_OUTPATIENT_CLINIC_OR_DEPARTMENT_OTHER): Payer: Self-pay | Admitting: Urology

## 2022-12-27 DIAGNOSIS — Z79891 Long term (current) use of opiate analgesic: Secondary | ICD-10-CM | POA: Diagnosis not present

## 2022-12-27 DIAGNOSIS — G8929 Other chronic pain: Secondary | ICD-10-CM | POA: Diagnosis not present

## 2022-12-27 DIAGNOSIS — G9050 Complex regional pain syndrome I, unspecified: Secondary | ICD-10-CM | POA: Diagnosis not present

## 2022-12-27 DIAGNOSIS — M79671 Pain in right foot: Secondary | ICD-10-CM | POA: Diagnosis not present

## 2022-12-27 NOTE — Progress Notes (Signed)
Spoke w/ via phone for pre-op interview--- pt Lab needs dos----  [facility] , ekg             Lab results------ no COVID test -----patient states asymptomatic no test needed Arrive at ------- 0930 on 12-28-2022 NPO after MN NO Solid Food.  Clear liquids from MN until--- 0830 Med rec completed Medications to take morning of surgery ----- breo inhaler Diabetic medication ----- n/a Patient instructed no nail polish to be worn day of surgery Patient instructed to bring photo id and insurance card day of surgery Patient aware to have Driver (ride ) / caregiver    for 24 hours after surgery -- husband, Nichole Delgado Patient Special Instructions ----- asked to bring rescue inhaler and spinal cord stimulator control dos Pre-Op special Istructions ----- n/a Patient verbalized understanding of instructions that were given at this phone interview. Patient denies shortness of breath, chest pain, fever, cough at this phone interview.

## 2022-12-28 ENCOUNTER — Encounter (HOSPITAL_BASED_OUTPATIENT_CLINIC_OR_DEPARTMENT_OTHER)
Admission: RE | Disposition: A | Discharge status: Home / Self Care | Payer: Self-pay | Source: Ambulatory Visit | Attending: Urology

## 2022-12-28 ENCOUNTER — Ambulatory Visit (HOSPITAL_BASED_OUTPATIENT_CLINIC_OR_DEPARTMENT_OTHER): Payer: Medicare Other | Admitting: Anesthesiology

## 2022-12-28 ENCOUNTER — Encounter (HOSPITAL_BASED_OUTPATIENT_CLINIC_OR_DEPARTMENT_OTHER): Payer: Self-pay | Admitting: Urology

## 2022-12-28 ENCOUNTER — Ambulatory Visit (HOSPITAL_BASED_OUTPATIENT_CLINIC_OR_DEPARTMENT_OTHER)
Admission: RE | Admit: 2022-12-28 | Discharge: 2022-12-28 | Disposition: A | Discharge status: Home / Self Care | Payer: Medicare Other | Source: Ambulatory Visit | Attending: Urology | Admitting: Urology

## 2022-12-28 DIAGNOSIS — N3946 Mixed incontinence: Secondary | ICD-10-CM | POA: Insufficient documentation

## 2022-12-28 DIAGNOSIS — Z87891 Personal history of nicotine dependence: Secondary | ICD-10-CM | POA: Diagnosis not present

## 2022-12-28 DIAGNOSIS — I10 Essential (primary) hypertension: Secondary | ICD-10-CM | POA: Diagnosis not present

## 2022-12-28 DIAGNOSIS — C679 Malignant neoplasm of bladder, unspecified: Secondary | ICD-10-CM | POA: Insufficient documentation

## 2022-12-28 DIAGNOSIS — D303 Benign neoplasm of bladder: Secondary | ICD-10-CM | POA: Diagnosis not present

## 2022-12-28 DIAGNOSIS — N3289 Other specified disorders of bladder: Secondary | ICD-10-CM

## 2022-12-28 DIAGNOSIS — J45909 Unspecified asthma, uncomplicated: Secondary | ICD-10-CM | POA: Insufficient documentation

## 2022-12-28 HISTORY — PX: TRANSURETHRAL RESECTION OF BLADDER TUMOR: SHX2575

## 2022-12-28 HISTORY — DX: Atherosclerotic heart disease of native coronary artery without angina pectoris: I25.10

## 2022-12-28 HISTORY — PX: CYSTOSCOPY: SHX5120

## 2022-12-28 LAB — POCT I-STAT, CHEM 8
BUN: 12 mg/dL (ref 8–23)
Calcium, Ion: 1.26 mmol/L (ref 1.15–1.40)
Chloride: 102 mmol/L (ref 98–111)
Creatinine, Ser: 1 mg/dL (ref 0.44–1.00)
Glucose, Bld: 100 mg/dL — ABNORMAL HIGH (ref 70–99)
HCT: 44 % (ref 36.0–46.0)
Hemoglobin: 15 g/dL (ref 12.0–15.0)
Potassium: 3.8 mmol/L (ref 3.5–5.1)
Sodium: 139 mmol/L (ref 135–145)
TCO2: 25 mmol/L (ref 22–32)

## 2022-12-28 SURGERY — TURBT (TRANSURETHRAL RESECTION OF BLADDER TUMOR)
Anesthesia: General | Site: Bladder

## 2022-12-28 MED ORDER — DEXAMETHASONE SODIUM PHOSPHATE 10 MG/ML IJ SOLN
INTRAMUSCULAR | Status: DC | PRN
  Administered 2022-12-28: 5 mg via INTRAVENOUS

## 2022-12-28 MED ORDER — ACETAMINOPHEN 500 MG PO TABS
1000.0000 mg | ORAL_TABLET | Freq: Once | ORAL | Status: AC
  Administered 2022-12-28: 1000 mg via ORAL

## 2022-12-28 MED ORDER — FENTANYL CITRATE (PF) 100 MCG/2ML IJ SOLN
25.0000 ug | INTRAMUSCULAR | Status: DC | PRN
  Administered 2022-12-28: 25 ug via INTRAVENOUS

## 2022-12-28 MED ORDER — FENTANYL CITRATE (PF) 100 MCG/2ML IJ SOLN
INTRAMUSCULAR | Status: AC
  Filled 2022-12-28: qty 2

## 2022-12-28 MED ORDER — CIPROFLOXACIN IN D5W 400 MG/200ML IV SOLN
INTRAVENOUS | Status: AC
  Filled 2022-12-28: qty 200

## 2022-12-28 MED ORDER — CIPROFLOXACIN IN D5W 400 MG/200ML IV SOLN
400.0000 mg | INTRAVENOUS | Status: AC
  Administered 2022-12-28: 400 mg via INTRAVENOUS

## 2022-12-28 MED ORDER — LIDOCAINE 2% (20 MG/ML) 5 ML SYRINGE
INTRAMUSCULAR | Status: DC | PRN
  Administered 2022-12-28: 60 mg via INTRAVENOUS

## 2022-12-28 MED ORDER — AMISULPRIDE (ANTIEMETIC) 5 MG/2ML IV SOLN: 10.0000 mg | Freq: Once | INTRAVENOUS | Status: DC | PRN

## 2022-12-28 MED ORDER — ACETAMINOPHEN 500 MG PO TABS
ORAL_TABLET | ORAL | Status: AC
  Filled 2022-12-28: qty 2

## 2022-12-28 MED ORDER — ONDANSETRON HCL 4 MG/2ML IJ SOLN: 4.0000 mg | Freq: Once | INTRAMUSCULAR | Status: DC | PRN

## 2022-12-28 MED ORDER — FENTANYL CITRATE (PF) 250 MCG/5ML IJ SOLN
INTRAMUSCULAR | Status: DC | PRN
  Administered 2022-12-28: 50 ug via INTRAVENOUS

## 2022-12-28 MED ORDER — LACTATED RINGERS IV SOLN: INTRAVENOUS | Status: DC

## 2022-12-28 MED ORDER — PROPOFOL 10 MG/ML IV BOLUS
INTRAVENOUS | Status: DC | PRN
  Administered 2022-12-28: 200 mg via INTRAVENOUS

## 2022-12-28 MED ORDER — PROPOFOL 10 MG/ML IV BOLUS
INTRAVENOUS | Status: AC
  Filled 2022-12-28: qty 20

## 2022-12-28 MED ORDER — SODIUM CHLORIDE 0.9 % IR SOLN
Status: DC | PRN
  Administered 2022-12-28: 3000 mL

## 2022-12-28 MED ORDER — ONDANSETRON HCL 4 MG/2ML IJ SOLN
INTRAMUSCULAR | Status: DC | PRN
  Administered 2022-12-28: 4 mg via INTRAVENOUS

## 2022-12-28 MED ORDER — MIDAZOLAM HCL 2 MG/2ML IJ SOLN
INTRAMUSCULAR | Status: DC | PRN
  Administered 2022-12-28: 1 mg via INTRAVENOUS

## 2022-12-28 MED ORDER — MIDAZOLAM HCL 2 MG/2ML IJ SOLN
INTRAMUSCULAR | Status: AC
  Filled 2022-12-28: qty 2

## 2022-12-28 SURGICAL SUPPLY — 22 items
BAG DRAIN URO-CYSTO SKYTR STRL (DRAIN) ×1 IMPLANT
BAG DRN RND TRDRP ANRFLXCHMBR (UROLOGICAL SUPPLIES)
BAG DRN UROCATH (DRAIN) ×1
BAG URINE DRAIN 2000ML AR STRL (UROLOGICAL SUPPLIES) IMPLANT
CATH FOLEY 2WAY SLVR  5CC 18FR (CATHETERS)
CATH FOLEY 2WAY SLVR 5CC 18FR (CATHETERS) IMPLANT
CLOTH BEACON ORANGE TIMEOUT ST (SAFETY) ×1 IMPLANT
ELECT REM PT RETURN 9FT ADLT (ELECTROSURGICAL)
ELECTRODE REM PT RTRN 9FT ADLT (ELECTROSURGICAL) ×1 IMPLANT
GLOVE BIO SURGEON STRL SZ 6.5 (GLOVE) ×1 IMPLANT
GOWN STRL REUS W/TWL LRG LVL3 (GOWN DISPOSABLE) ×1 IMPLANT
GUIDEWIRE STR DUAL SENSOR (WIRE) IMPLANT
HOLDER FOLEY CATH W/STRAP (MISCELLANEOUS) IMPLANT
IV NS IRRIG 3000ML ARTHROMATIC (IV SOLUTION) ×1 IMPLANT
KIT TURNOVER CYSTO (KITS) ×1 IMPLANT
LOOP CUT BIPOLAR 24F LRG (ELECTROSURGICAL) IMPLANT
MANIFOLD NEPTUNE II (INSTRUMENTS) ×1 IMPLANT
PACK CYSTO (CUSTOM PROCEDURE TRAY) ×1 IMPLANT
SYR TOOMEY IRRIG 70ML (MISCELLANEOUS)
SYRINGE TOOMEY IRRIG 70ML (MISCELLANEOUS) ×1 IMPLANT
TUBE CONNECTING 12X1/4 (SUCTIONS) ×1 IMPLANT
TUBING UROLOGY SET (TUBING) ×1 IMPLANT

## 2022-12-28 NOTE — Anesthesia Postprocedure Evaluation (Signed)
Anesthesia Post Note  Patient: Nichole Delgado  Procedure(s) Performed: TRANSURETHRAL RESECTION OF BLADDER TUMOR (TURBT) (Bladder) CYSTOSCOPY (Bladder)     Patient location during evaluation: PACU Anesthesia Type: General Level of consciousness: awake Pain management: pain level controlled Vital Signs Assessment: post-procedure vital signs reviewed and stable Respiratory status: spontaneous breathing, nonlabored ventilation and respiratory function stable Cardiovascular status: blood pressure returned to baseline and stable Postop Assessment: no apparent nausea or vomiting Anesthetic complications: no   No notable events documented.  Last Vitals:  Vitals:   12/28/22 1215 12/28/22 1230  BP: (!) 147/86 (!) 151/83  Pulse: 69 70  Resp: 18   Temp: 36.5 C   SpO2: 98% 98%    Last Pain:  Vitals:   12/28/22 1240  TempSrc:   PainSc: 2                  Arielle Eber P Brodrick Curran

## 2022-12-28 NOTE — Anesthesia Procedure Notes (Signed)
Procedure Name: LMA Insertion Date/Time: 12/28/2022 11:09 AM  Performed by: Clearnce Sorrel, CRNAPre-anesthesia Checklist: Patient identified, Emergency Drugs available, Suction available and Patient being monitored Patient Re-evaluated:Patient Re-evaluated prior to induction Oxygen Delivery Method: Circle System Utilized Preoxygenation: Pre-oxygenation with 100% oxygen Induction Type: IV induction Ventilation: Mask ventilation without difficulty LMA: LMA inserted LMA Size: 4.0 Number of attempts: 1 Airway Equipment and Method: Bite block Placement Confirmation: positive ETCO2 Tube secured with: Tape Dental Injury: Teeth and Oropharynx as per pre-operative assessment

## 2022-12-28 NOTE — Anesthesia Preprocedure Evaluation (Addendum)
Anesthesia Evaluation  Patient identified by MRN, date of birth, ID band Patient awake    Reviewed: Allergy & Precautions, NPO status , Patient's Chart, lab work & pertinent test results  History of Anesthesia Complications (+) PONV and history of anesthetic complications  Airway Mallampati: III  TM Distance: >3 FB Neck ROM: Full    Dental no notable dental hx.    Pulmonary asthma , Patient abstained from smoking., former smoker   Pulmonary exam normal        Cardiovascular hypertension, Pt. on medications Normal cardiovascular exam     Neuro/Psych  Headaches PSYCHIATRIC DISORDERS Anxiety      Neuromuscular disease    GI/Hepatic negative GI ROS, Neg liver ROS,,,  Endo/Other  negative endocrine ROS    Renal/GU negative Renal ROS     Musculoskeletal  (+) Arthritis ,    Abdominal   Peds  Hematology negative hematology ROS (+)   Anesthesia Other Findings BLADDER LESION  Reproductive/Obstetrics                             Anesthesia Physical Anesthesia Plan  ASA: 3  Anesthesia Plan: General   Post-op Pain Management:    Induction: Intravenous  PONV Risk Score and Plan: 4 or greater and Ondansetron, Dexamethasone, Propofol infusion, Midazolam and Treatment may vary due to age or medical condition  Airway Management Planned:   Additional Equipment:   Intra-op Plan:   Post-operative Plan: Extubation in OR  Informed Consent: I have reviewed the patients History and Physical, chart, labs and discussed the procedure including the risks, benefits and alternatives for the proposed anesthesia with the patient or authorized representative who has indicated his/her understanding and acceptance.     Dental advisory given  Plan Discussed with: CRNA  Anesthesia Plan Comments:        Anesthesia Quick Evaluation

## 2022-12-28 NOTE — Interval H&P Note (Signed)
History and Physical Interval Note:  12/28/2022 10:24 AM  Nichole Delgado  has presented today for surgery, with the diagnosis of BLADDER LESION.  The various methods of treatment have been discussed with the patient and family. After consideration of risks, benefits and other options for treatment, the patient has consented to  Procedure(s) with comments: TRANSURETHRAL RESECTION OF BLADDER TUMOR (TURBT) (N/A) - 45 MINS CYSTOSCOPY (N/A) as a surgical intervention.  The patient's history has been reviewed, patient examined, no change in status, stable for surgery.  I have reviewed the patient's chart and labs.  Questions were answered to the patient's satisfaction.     Leandro Berkowitz D Fujie Dickison

## 2022-12-28 NOTE — Transfer of Care (Signed)
Immediate Anesthesia Transfer of Care Note  Patient: ROSALEIGH BRAZZEL  Procedure(s) Performed: TRANSURETHRAL RESECTION OF BLADDER TUMOR (TURBT) (Bladder) CYSTOSCOPY (Bladder)  Patient Location: PACU  Anesthesia Type:General  Level of Consciousness: awake, alert , and oriented  Airway & Oxygen Therapy: Patient Spontanous Breathing  Post-op Assessment: Report given to RN and Post -op Vital signs reviewed and stable  Post vital signs: Reviewed and stable  Last Vitals:  Vitals Value Taken Time  BP    Temp    Pulse 75 12/28/22 1150  Resp 23 12/28/22 1150  SpO2 96 % 12/28/22 1150  Vitals shown include unvalidated device data.  Last Pain:  Vitals:   12/28/22 0949  TempSrc: Oral  PainSc: 6       Patients Stated Pain Goal: 2 (17/61/60 [id number])  Complications: No notable events documented.

## 2022-12-28 NOTE — Discharge Instructions (Addendum)
Post Bladder Surgery Instructions   General instructions:     Your recent bladder surgery requires very little post hospital care but some definite precautions.  Despite the fact that no skin incisions were used, the area around the bladder incisions are raw and covered with scabs to promote healing and prevent bleeding. Certain precautions are needed to insure that the scabs are not disturbed over the next 2-4 weeks while the healing proceeds.  Because the raw surface inside your bladder and the irritating effects of urine you may expect frequency of urination and/or urgency (a stronger desire to urinate) and perhaps even getting up at night more often. This will usually resolve or improve slowly over the healing period. You may see some blood in your urine over the first 6 weeks. Do not be alarmed, even if the urine was clear for a while. Get off your feet and drink lots of fluids until clearing occurs. If you start to pass clots or don't improve call us.  Catheter: (If you are discharged with a catheter.)  1. Keep your catheter secured to your leg at all times with tape or the supplied strap. 2. You may experience leakage of urine around your catheter- as long as the  catheter continues to drain, this is normal.  If your catheter stops draining  go to the ER. 3. You may also have blood in your urine, even after it has been clear for  several days; you may even pass some small blood clots or other material.  This  is normal as well.  If this happens, sit down and drink plenty of water to help  make urine to flush out your bladder.  If the blood in your urine becomes worse  after doing this, contact our office or return to the ER. 4. You may use the leg bag (small bag) during the day, but use the large bag at  night.  Diet:  You may return to your normal diet immediately. Because of the raw surface of your bladder, alcohol, spicy foods, foods high in acid and drinks with caffeine may  cause irritation or frequency and should be used in moderation. To keep your urine flowing freely and avoid constipation, drink plenty of fluids during the day (8-10 glasses). Tip: Avoid cranberry juice because it is very acidic.  Activity:  Your physical activity doesn't need to be restricted. However, if you are very active, you may see some blood in the urine. We suggest that you reduce your activity under the circumstances until the bleeding has stopped.  Bowels:  It is important to keep your bowels regular during the postoperative period. Straining with bowel movements can cause bleeding. A bowel movement every other day is reasonable. Use a mild laxative if needed, such as milk of magnesia 2-3 tablespoons, or 2 Dulcolax tablets. Call if you continue to have problems. If you had been taking narcotics for pain, before, during or after your surgery, you may be constipated. Take a laxative if necessary.    Medication:  You should resume your pre-surgery medications unless told not to. In addition you may be given an antibiotic to prevent or treat infection. Antibiotics are not always necessary. All medication should be taken as prescribed until the bottles are finished unless you are having an unusual reaction to one of the drugs.  No acetaminophen/Tylenol until after 4:08 pm today if needed.     Post Anesthesia Home Care Instructions  Activity: Get plenty of rest for the  remainder of the day. A responsible individual must stay with you for 24 hours following the procedure.  For the next 24 hours, DO NOT: -Drive a car -Paediatric nurse -Drink alcoholic beverages -Take any medication unless instructed by your physician -Make any legal decisions or sign important papers.  Meals: Start with liquid foods such as gelatin or soup. Progress to regular foods as tolerated. Avoid greasy, spicy, heavy foods. If nausea and/or vomiting occur, drink only clear liquids until the nausea and/or  vomiting subsides. Call your physician if vomiting continues.  Special Instructions/Symptoms: Your throat may feel dry or sore from the anesthesia or the breathing tube placed in your throat during surgery. If this causes discomfort, gargle with warm salt water. The discomfort should disappear within 24 hours.

## 2022-12-28 NOTE — H&P (Signed)
CC/HPI: cc: SUI   04/09/21: 72 year old woman comes in with several year history of stress urinary incontinence. It has now gotten to the point where she has to wear panty liner daily. She leaks with coughing, sneezing or physical activity. She does have occasional urgency with urge incontinence but this is not very bothersome. She denies hematuria, UTIs or kidney stones. She did have an accident 4 years ago requiring back surgery. She has a history of a partial hysterectomy.   04/20/21: Here for pelvic and cysto   05/19/21: 72 year old woman with a history of stress urinary incontinence found to have abnormal bladder mucosa on cystoscopy. She subsequently underwent cystoscopy with bladder biopsy and fulguration. 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 No malignancy identified. Patient has recovered well from surgery and denies any hematuria.    10/09/2021: 72 year old woman with history of stress urinary incontinence. She is undergone prior bulkamid procedure. She had temporary improved results but would like to repeat this procedure. She confirms taking antibiotic this morning.   10/15/2021: 72 year old woman with history of stress urinary incontinence who underwent bulkamid procedure approximately 1 week ago. Patient had significant urinary frequency, hematuria and incontinence following the procedure. She states it has significantly improved over the last 2 days. Urinalysis today is not concerning for infection. PVR is 0. She coughed with a full bladder this morning and only leaked a tiny bit.   10/28/21: She presents for follow-up after undergoing Bulka med. Is doing very well and reports it is working well for her. She denies interval gross hematuria or fevers. She has no difficulty voiding.   12/15/22: 72 year old woman with a history of stress urinary continence who is undergone Bulkamid x 2 about a year ago. She also had a bladder biopsy done that showed  hyperplasia with early papillary formation. She is here today for follow-up. She is doing well today from a leakage standpoint. She only leaks if she has a very full bladder first thing in the morning and then coughs. Otherwise during the day she is not leaking. Her husband has a history of bladder cancer and has undergone BCG before. Patient is currently using tobacco.     ALLERGIES: Pencillins [facility] Drugs Sulfa Topomax    MEDICATIONS: Breo Ellipta     GU PSH: Cysto Bladder Ureth Biopsy - 2022 Cystoscopy - 2022 Cystoscopy Collagen Injection - 10/09/2021, 07/06/2021 Cystoscopy Fulguration - 2022     NON-GU PSH: Foot/toes Surgery Procedure Injectable Bulking Agent, Synthetic Implant, Urinary Tract, 1 Ml Syringe, Includes Shipping And Nece - 10/09/2021, 07/06/2021     GU PMH: Stress Incontinence - 10/28/2021, - 10/15/2021, - 10/09/2021, - 07/06/2021, Patient would like to be scheduled for bulkamid in the office. I discussed risks and benefits of the procedure and possibility of acute urinary retention. She also understands that some people may need 2 different bulking procedures. She will be scheduled for next available date., - 2022, I would like to proceed with cystoscopy and bladder biopsy prior to bulkamid. , - 2022, - 2022 Urge incontinence - 10/15/2021, - 2022 Urinary Urgency - 10/15/2021, - 2022 Bladder tumor/neoplasm, Reviewed pathology report with patient which showed urothelial hyperplasia and early papillary growth. Although this does not indicate malignancy, based on appearance and patient's tobacco history would like to repeat cystoscopy in 1 year. - 2022, Cystoscopy showed abnormal bladder mucosa concerning for possible neoplasm. Discussed risks and benefits of cystoscopy with bladder biopsy and fulguration. These include but not limited to bleeding, infection, dysuria, pain,  damage to surrounding structures, need for Foley catheter, need for future treatment. Patient understands  and wishes to proceed. She will be scheduled for surgery., - 2022    NON-GU PMH: No Non-GU PMH    FAMILY HISTORY: No Family History    SOCIAL HISTORY: No Social History    REVIEW OF SYSTEMS:    GU Review Female:   Patient denies frequent urination, hard to postpone urination, burning /pain with urination, get up at night to urinate, leakage of urine, stream starts and stops, trouble starting your stream, have to strain to urinate, and being pregnant.  Gastrointestinal (Upper):   Patient denies vomiting, indigestion/ heartburn, and nausea.  Gastrointestinal (Lower):   Patient denies diarrhea and constipation.  Constitutional:   Patient denies fever, night sweats, weight loss, and fatigue.  Skin:   Patient denies skin rash/ lesion and itching.  Eyes:   Patient denies blurred vision and double vision.  Ears/ Nose/ Throat:   Patient denies sore throat and sinus problems.  Hematologic/Lymphatic:   Patient denies swollen glands and easy bruising.  Cardiovascular:   Patient denies leg swelling and chest pains.  Respiratory:   Patient denies cough and shortness of breath.  Endocrine:   Patient denies excessive thirst.  Musculoskeletal:   Patient denies back pain and joint pain.  Neurological:   Patient denies headaches and dizziness.  Psychologic:   Patient denies depression and anxiety.   VITAL SIGNS:      12/15/2022 02:52 PM  BP 129/78 mmHg  Pulse 77 /min  Temperature 97.5 F / 36.3 C   MULTI-SYSTEM PHYSICAL EXAMINATION:    Constitutional: Well-nourished. No physical deformities. Normally developed. Good grooming.  Neck: Neck symmetrical, not swollen. Normal tracheal position.  Respiratory: No labored breathing, no use of accessory muscles.   Skin: No paleness, no jaundice, no cyanosis. No lesion, no ulcer, no rash.  Neurologic / Psychiatric: Oriented to time, oriented to place, oriented to person. No depression, no anxiety, no agitation.  Eyes: Normal conjunctivae. Normal eyelids.   Ears, Nose, Mouth, and Throat: Left ear no scars, no lesions, no masses. Right ear no scars, no lesions, no masses. Nose no scars, no lesions, no masses. Normal hearing. Normal lips.  Musculoskeletal: Normal gait and station of head and neck.     Complexity of Data:  Records Review:   Previous Patient Records, POC Tool  Urine Test Review:   Urinalysis   PROCEDURES:         Flexible Cystoscopy - 52000  Risks, benefits, and some of the potential complications of the procedure were discussed at length with the patient including infection, bleeding, voiding discomfort, urinary retention, fever, chills, sepsis, and others. All questions were answered. Informed consent was obtained. Antibiotic prophylaxis was given. Sterile technique and intraurethral analgesia were used.  Meatus:  Normal size. Normal location. Normal condition.  Urethra:  No hypermobility. No leakage.  Ureteral Orifices:  Normal location. Normal size. Normal shape. Effluxed clear urine.  Bladder:  Well healed prior TUR scar left lateral wall adjacent to left UO. Papillary growth surrounding medial border of scar and left UO.      The lower urinary tract was carefully examined. The procedure was well-tolerated and without complications. Antibiotic instructions were given. Instructions were given to call the office immediately for bloody urine, difficulty urinating, urinary retention, painful or frequent urination, fever, chills, nausea, vomiting or other illness. The patient stated that she understood these instructions and would comply with them.  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 Bladder lesion: Patient with recurrence of papillary bladder growth surrounding left UO. This is concerning for early bladder cancer. Discussed with patient that bladder cancer is associated tobacco and recommended that she cut back or quit if she can. Will also set patient up for cystoscopy with bladder biopsy/TURBT. Risks and benefits of the procedure discussed with the patient and she is elected to proceed.   2. Stress urinary continence: This is stable following Bulkamid.

## 2022-12-28 NOTE — Op Note (Signed)
Operative Note  Preoperative diagnosis:  1.  Abnormal bladder mucosa  Postoperative diagnosis: 1.  Same  Procedure(s): 1.  Cystoscopy, transurethral resection of bladder tumor 2cm  Surgeon: Jacalyn Lefevre, MD  Assistants:  None  Anesthesia:  General  Complications:  None  EBL:  minmal  Specimens: 1. Possible bladder tumor from bladder neck 2. Left UVJ bladder tumor 3. Posterior wall bladder tumor  Drains/Catheters: 1.  none  Intraoperative findings:   Normal urethra Detached piece of urothelium from right bladder neck Left UO with early papillary growth Posterior wall was slightly irregular erythematous bladder mucosa  Indication:  Nichole Delgado is a 72 y.o. female with a history of stress urinary continence was undergone Bulkamid also with abnormal bladder mucosa noted on cystoscopy during evaluation for stress incontinence.  Prior bladder biopsy has shown hyperplasia.  Repeat cystoscopy showed continued irregularity surrounding the prior TUR scar.  Description of procedure:  After risks and benefits of the procedure were discussed with the patient in detail, informed consent was obtained.  The patient was taken to the operating room placed in the supine position.  Anesthesia was induced and antibiotics were administered.  The patient was then repositioned in the dorsolithotomy position.  She was prepped and draped in usual sterile fashion.  A timeout was performed.  A 23 French rigid cystoscope was placed in the urethral meatus and advanced into the bladder.  Cystoscopy then took place and findings are noted above.  A detached piece of what looked like urothelium was adherent to the right bladder neck.  This was sent to pathology as a possible bladder tumor.  Attention turned to the left ureteral orifice which had early papillary growth surrounding it.  Decision was made to proceed with a TURBT using the resectoscope.  The cystoscope was removed and the resectoscope  using the visual obturator was advanced into the bladder.  The visual obturator was removed and the working element with the bipolar electrocautery loop was assembled.  Resection took place of the left ureteral orifice.  Care was taken not to cauterize over the left UO.  E flux was seen from the left UO after resection.  It was also cannulated with a sensor wire without difficulty.  Cautery then took place surrounding the left UO and sloughing bladder mucosa was visible.  Hemostasis was adequate with the irrigant turned off.  Attention then turned to the posterior wall where there was slightly irregular erythematous bladder mucosa on the superior portion.  A cold cup bladder biopsy was then taken of this area and fulguration was then performed with bipolar electrocautery.  Hemostasis was again adequate with irrigant turned off at the end of the case.  The patient's bladder was decompressed and the resectoscope removed.  The patient emerged from anesthesia and transferred the PACU in stable condition.  Plan: Discharge home

## 2022-12-29 ENCOUNTER — Encounter (HOSPITAL_BASED_OUTPATIENT_CLINIC_OR_DEPARTMENT_OTHER): Payer: Self-pay | Admitting: Urology

## 2022-12-29 LAB — SURGICAL PATHOLOGY

## 2023-01-04 ENCOUNTER — Ambulatory Visit (HOSPITAL_BASED_OUTPATIENT_CLINIC_OR_DEPARTMENT_OTHER): Payer: Medicare Other | Admitting: Pulmonary Disease

## 2023-01-04 DIAGNOSIS — D414 Neoplasm of uncertain behavior of bladder: Secondary | ICD-10-CM | POA: Diagnosis not present

## 2023-01-04 DIAGNOSIS — N393 Stress incontinence (female) (male): Secondary | ICD-10-CM | POA: Diagnosis not present

## 2023-01-13 ENCOUNTER — Telehealth: Payer: Self-pay | Admitting: Pharmacist

## 2023-01-13 NOTE — Progress Notes (Signed)
Erroneous Encounter

## 2023-01-17 ENCOUNTER — Telehealth: Payer: Self-pay | Admitting: Internal Medicine

## 2023-01-17 ENCOUNTER — Other Ambulatory Visit: Payer: Self-pay | Admitting: *Deleted

## 2023-01-17 NOTE — Telephone Encounter (Signed)
Spoke to patient to see if she recalls whom she may have left paperwork with (as I am unable to locate said paperwork). Patient states that her husband dropped off the forms. He originally tried to leave it with the front desk but "nobody was there so he went around the back and gave it to Dr Hilarie Fredrickson." It was a Engineering geologist with a note, "Dr Pyrtle/Nurse."   Again, I am unable to locate this paperwork. Therefore, I have downloaded the form for [employer] Patient assistance off of the internet and filled out our portion. I have placed it on Dr Vena Rua desk for his signature and we will then send the forms to [employer].   Patient has been advised of the current status of paperwork.

## 2023-01-17 NOTE — Telephone Encounter (Signed)
Patient called states she dropped off some paperwork back in October, to continue the [place] medication. She would like to check the status of the application because she still has not heard anything.

## 2023-01-20 ENCOUNTER — Ambulatory Visit (INDEPENDENT_AMBULATORY_CARE_PROVIDER_SITE_OTHER): Payer: Medicare Other | Admitting: Pulmonary Disease

## 2023-01-20 ENCOUNTER — Encounter (HOSPITAL_BASED_OUTPATIENT_CLINIC_OR_DEPARTMENT_OTHER): Payer: Self-pay | Admitting: Pulmonary Disease

## 2023-01-20 VITALS — BP 116/68 | HR 87 | Temp 98.5°F | Ht <= 58 in | Wt 132.8 lb

## 2023-01-20 DIAGNOSIS — J4540 Moderate persistent asthma, uncomplicated: Secondary | ICD-10-CM | POA: Diagnosis not present

## 2023-01-20 DIAGNOSIS — R911 Solitary pulmonary nodule: Secondary | ICD-10-CM

## 2023-01-20 MED ORDER — PREDNISONE 10 MG PO TABS: ORAL_TABLET | ORAL | 0 refills | Status: AC

## 2023-01-20 NOTE — Assessment & Plan Note (Signed)
Not hypermetabolic on PET but I remain concerned about slow-growing malignancy given growth and size from 10 mm to 17 mm from 20 21-20 23. Will continue 17-monthfollow-up of this nodule

## 2023-01-20 NOTE — Assessment & Plan Note (Signed)
Continue Breo once daily. Use albuterol for rescue. For nonacute cough, no evidence of bronchospasm today, will use low-dose prednisone for 2 weeks  Prednisone 10 mg tabs  Take 2 tabs daily with food x 5ds, then 1 tab daily with food x 5ds then STOP

## 2023-01-20 NOTE — Patient Instructions (Signed)
X Prednisone 10 mg tabs  Take 2 tabs daily with food x 5ds, then 1 tab daily with food x 5ds then STOP  X CT chest without con in June 2024  Trial of nicotine patch 21 mg If does not work, call for prescription for nicotine inhaler

## 2023-01-20 NOTE — Assessment & Plan Note (Signed)
We discussed physical and psychological nicotine dependence. She will trial nicotine patch 21 mg, if no effect then we can trial Nicotrol inhaler instead

## 2023-01-20 NOTE — Progress Notes (Signed)
   Subjective:    Patient ID: Nichole Delgado, female    DOB: 01/05/1951, 72 y.o.   MRN: [id number]  HPI  72 yo active smoker followed for DOE, pulmonary nodule and chronic bronchitis vs asthma   She continues to smoke about a pack per day, more than 50 pack years     PMH -complex regional pain syndrome after a fall and fractures of both ankles with right foot drop status post spinal cord stimulator, -IBS with diarrhea   Based on bronchodilator response on PFTs, she was started on Symbicort with a diagnosis of asthma.  However this caused her heart to race and she was switched to Advair. 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    86-monthfollow-up visit. Interim she had surgery to remove hardware in the right right, she had a urinary bladder tumor removed.  She required intubation and wonders if this hurt her vocal cords. We reviewed PET scan and follow-up CT scan results.  She underwent ENT evaluation which did not show any vocal cord nodule -reviewed Dr. RConstance Holsterconsultation?  Artifact She continues to smoke about a pack per day.  Her husband and daughter smoke.  She inquires about vaping.  She reports a cough for 2 weeks, clear sputum production, known sick contacts  Significant tests/ events reviewed  CT chest without con 11/2022 persistent left upper lobe nodule 17 x 8 mm PET 08/2022 negative, mild hypermetabolism [facility] increased size of groundglass left upper lobe nodule to 14.6 mm with central solid component measuring 8 mm CT chest 04/2017-10 x 7 mm groundglass opacity in the left upper lobe CT chest 11/2017- stable 10 x 7 mm groundglass opacity in the left upper lobe.  Improvement in small nodular opacities on the right.  CT chest 06/2020 stable 10 x 7 mm left upper lobe  nodule   PFTs  05/27/2022 : FVC 65, FEV1 65, ratio 75.  positive bronchodilator response (19%)   01/2018 FVC 2.10 [81%], FEV1 1.64 [84%], F/F 78, TLC 91%, RV/TLC 128%, DLCO 81% Minimal obstruction, small airways disease   FENO 02/13/18-7    Review of Systems neg for any significant sore throat, dysphagia, itching, sneezing, nasal congestion or excess/ purulent secretions, fever, chills, sweats, unintended wt loss, pleuritic or exertional cp, hempoptysis, orthopnea pnd or change in chronic leg swelling. Also denies presyncope, palpitations, heartburn, abdominal pain, nausea, vomiting, diarrhea or change in bowel or urinary habits, dysuria,hematuria, rash, arthralgias, visual complaints, headache, numbness weakness or ataxia.     Objective:   Physical Exam  Gen. Pleasant, well-nourished, in no distress ENT - no thrush, no pallor/icterus,no post nasal drip Neck: No JVD, no thyromegaly, no carotid bruits Lungs: no use of accessory muscles, no dullness to percussion, clear without rales or rhonchi  Cardiovascular: Rhythm regular, heart sounds  normal, no murmurs or gallops, no peripheral edema Musculoskeletal: No deformities, no cyanosis or clubbing        Assessment & Plan:

## 2023-01-27 DIAGNOSIS — M5412 Radiculopathy, cervical region: Secondary | ICD-10-CM | POA: Diagnosis not present

## 2023-01-27 DIAGNOSIS — M47892 Other spondylosis, cervical region: Secondary | ICD-10-CM | POA: Diagnosis not present

## 2023-01-27 DIAGNOSIS — Z6826 Body mass index (BMI) 26.0-26.9, adult: Secondary | ICD-10-CM | POA: Diagnosis not present

## 2023-01-27 DIAGNOSIS — M542 Cervicalgia: Secondary | ICD-10-CM | POA: Diagnosis not present

## 2023-01-27 MED ORDER — VIBERZI 75 MG PO TABS: 1.0000 | ORAL_TABLET | Freq: Two times a day (BID) | ORAL | 3 refills | Status: DC

## 2023-01-31 NOTE — Telephone Encounter (Signed)
I contacted [employer] to make certain they got all appropriate paperwork/scripts from our office that was sent to them via fax. Per Amador Cunas, they do have all paperwork and these forms are up for review and should have an answer within 24 hours.

## 2023-02-01 DIAGNOSIS — M542 Cervicalgia: Secondary | ICD-10-CM | POA: Diagnosis not present

## 2023-02-01 DIAGNOSIS — M79671 Pain in right foot: Secondary | ICD-10-CM | POA: Diagnosis not present

## 2023-02-01 DIAGNOSIS — Z79891 Long term (current) use of opiate analgesic: Secondary | ICD-10-CM | POA: Diagnosis not present

## 2023-02-01 DIAGNOSIS — G9050 Complex regional pain syndrome I, unspecified: Secondary | ICD-10-CM | POA: Diagnosis not present

## 2023-02-01 DIAGNOSIS — G8929 Other chronic pain: Secondary | ICD-10-CM | POA: Diagnosis not present

## 2023-02-02 ENCOUNTER — Other Ambulatory Visit: Payer: Self-pay | Admitting: Orthopaedic Surgery

## 2023-02-02 DIAGNOSIS — M5412 Radiculopathy, cervical region: Secondary | ICD-10-CM

## 2023-02-03 DIAGNOSIS — Z79890 Hormone replacement therapy (postmenopausal): Secondary | ICD-10-CM | POA: Diagnosis not present

## 2023-02-03 DIAGNOSIS — Z01419 Encounter for gynecological examination (general) (routine) without abnormal findings: Secondary | ICD-10-CM | POA: Diagnosis not present

## 2023-02-03 DIAGNOSIS — Z6828 Body mass index (BMI) 28.0-28.9, adult: Secondary | ICD-10-CM | POA: Diagnosis not present

## 2023-02-04 ENCOUNTER — Other Ambulatory Visit: Payer: Self-pay | Admitting: Family Medicine

## 2023-02-04 DIAGNOSIS — Z1231 Encounter for screening mammogram for malignant neoplasm of breast: Secondary | ICD-10-CM

## 2023-02-14 ENCOUNTER — Ambulatory Visit
Admission: RE | Admit: 2023-02-14 | Discharge: 2023-02-14 | Disposition: A | Discharge status: Home / Self Care | Payer: Medicare Other | Source: Ambulatory Visit | Attending: Orthopaedic Surgery | Admitting: Orthopaedic Surgery

## 2023-02-14 ENCOUNTER — Telehealth: Payer: Self-pay | Admitting: *Deleted

## 2023-02-14 DIAGNOSIS — M5412 Radiculopathy, cervical region: Secondary | ICD-10-CM

## 2023-02-14 MED ORDER — IOPAMIDOL (ISOVUE-M 300) INJECTION 61%
10.0000 mL | Freq: Once | INTRAMUSCULAR | Status: AC
  Administered 2023-02-14: 10 mL via INTRATHECAL

## 2023-02-14 MED ORDER — MEPERIDINE HCL 50 MG/ML IJ SOLN: 50.0000 mg | Freq: Once | INTRAMUSCULAR | Status: DC | PRN

## 2023-02-14 MED ORDER — DIAZEPAM 5 MG PO TABS
5.0000 mg | ORAL_TABLET | Freq: Once | ORAL | Status: AC
  Administered 2023-02-14: 5 mg via ORAL

## 2023-02-14 MED ORDER — ONDANSETRON HCL 4 MG/2ML IJ SOLN: 4.0000 mg | Freq: Once | INTRAMUSCULAR | Status: DC | PRN

## 2023-02-14 NOTE — Discharge Instructions (Signed)

## 2023-02-14 NOTE — Telephone Encounter (Signed)
Contacted patient to follow up to see if she has received [employer] medication. She indicates that she still has not been told whether she is approved and has not received any medication.   I again contacted [employer] and spoke with Latrice. She indicates that the patient's [employer] application has been approved and will be processed today. Patient should receive her medication within 7-10 business days and should call back around 04/18/23 to order her next 3 month shipment.   I called patient back to advise her of this information and she verbalizes understanding.

## 2023-02-16 DIAGNOSIS — N393 Stress incontinence (female) (male): Secondary | ICD-10-CM | POA: Diagnosis not present

## 2023-02-16 DIAGNOSIS — D414 Neoplasm of uncertain behavior of bladder: Secondary | ICD-10-CM | POA: Diagnosis not present

## 2023-02-16 DIAGNOSIS — N130 Hydronephrosis with ureteropelvic junction obstruction: Secondary | ICD-10-CM | POA: Diagnosis not present

## 2023-02-17 DIAGNOSIS — M5412 Radiculopathy, cervical region: Secondary | ICD-10-CM | POA: Diagnosis not present

## 2023-02-17 DIAGNOSIS — M542 Cervicalgia: Secondary | ICD-10-CM | POA: Diagnosis not present

## 2023-02-25 ENCOUNTER — Telehealth: Payer: Self-pay | Admitting: Pharmacist

## 2023-02-25 ENCOUNTER — Other Ambulatory Visit (HOSPITAL_BASED_OUTPATIENT_CLINIC_OR_DEPARTMENT_OTHER): Payer: Self-pay

## 2023-02-25 DIAGNOSIS — Z23 Encounter for immunization: Secondary | ICD-10-CM | POA: Diagnosis not present

## 2023-02-25 MED ORDER — COMIRNATY 30 MCG/0.3ML IM SUSY
PREFILLED_SYRINGE | INTRAMUSCULAR | 0 refills | Status: DC
  Filled 2023-02-25: qty 0.3, 1d supply, fill #0

## 2023-02-25 NOTE — Progress Notes (Signed)
Care Management & Coordination Services Pharmacy Team  Reason for Encounter: Appointment Reminder  Contacted patient to confirm telephone appointment with Leata Mouse, PharmD on 02/28/2023 at 1:30 pm. Spoke with patient on 02/25/2023    Star Rating Drugs:  Losartan 25 mg last filled 10/31/2022 90 DS Pravastatin 40 mg last filled 11/08/2022 90 DS   Care Gaps: Annual wellness visit in last year? Yes   Future Appointments  Date Time Provider [place]  02/28/2023  1:30 PM Edythe Clarity, [facility] None  03/22/2023  1:50 PM [facility] MM 2 [facility]   [name], [place] Pharmacist Assistant [phone]

## 2023-02-28 ENCOUNTER — Encounter: Payer: Medicare Other | Admitting: Pharmacist

## 2023-03-02 DIAGNOSIS — G9050 Complex regional pain syndrome I, unspecified: Secondary | ICD-10-CM | POA: Diagnosis not present

## 2023-03-02 DIAGNOSIS — Z79891 Long term (current) use of opiate analgesic: Secondary | ICD-10-CM | POA: Diagnosis not present

## 2023-03-02 DIAGNOSIS — M79671 Pain in right foot: Secondary | ICD-10-CM | POA: Diagnosis not present

## 2023-03-02 DIAGNOSIS — G8929 Other chronic pain: Secondary | ICD-10-CM | POA: Diagnosis not present

## 2023-03-02 DIAGNOSIS — M542 Cervicalgia: Secondary | ICD-10-CM | POA: Diagnosis not present

## 2023-03-06 ENCOUNTER — Other Ambulatory Visit: Payer: Self-pay | Admitting: Family Medicine

## 2023-03-08 ENCOUNTER — Telehealth: Payer: Self-pay | Admitting: Pulmonary Disease

## 2023-03-08 NOTE — Telephone Encounter (Signed)
Please advise 

## 2023-03-08 NOTE — Telephone Encounter (Signed)
PT calling. States she feels her Generic Breo needs to be up'ed in dosage just slightly Pharm is Pa[facility] nurse [facility] [address].  States she will be gone and OK to leave a message. Her # [phone]

## 2023-03-09 ENCOUNTER — Other Ambulatory Visit: Payer: Self-pay

## 2023-03-09 MED ORDER — FLUTICASONE FUROATE-VILANTEROL 200-25 MCG/ACT IN AEPB: 1.0000 | INHALATION_SPRAY | Freq: Every day | RESPIRATORY_TRACT | 6 refills | Status: DC

## 2023-03-09 NOTE — Telephone Encounter (Signed)
Spoke with patient. Sent in script for Breo 200 to patients pharmacy. Also got patient scheduled to see a np in one month. Nothing further needed.

## 2023-03-17 NOTE — Telephone Encounter (Signed)
Re-routing to triage so PT can be called.

## 2023-03-17 NOTE — Telephone Encounter (Signed)
Called and spoke with patient and she states that she already picked up her Breo 200. It was already sent to the pharmacy. Nothing further needed

## 2023-03-21 DIAGNOSIS — M5412 Radiculopathy, cervical region: Secondary | ICD-10-CM | POA: Diagnosis not present

## 2023-03-22 ENCOUNTER — Ambulatory Visit
Admission: RE | Admit: 2023-03-22 | Discharge: 2023-03-22 | Disposition: A | Discharge status: Home / Self Care | Payer: Medicare Other | Source: Ambulatory Visit | Attending: Family Medicine | Admitting: Family Medicine

## 2023-03-22 DIAGNOSIS — Z1231 Encounter for screening mammogram for malignant neoplasm of breast: Secondary | ICD-10-CM | POA: Diagnosis not present

## 2023-03-30 DIAGNOSIS — M542 Cervicalgia: Secondary | ICD-10-CM | POA: Diagnosis not present

## 2023-03-30 DIAGNOSIS — M79671 Pain in right foot: Secondary | ICD-10-CM | POA: Diagnosis not present

## 2023-03-30 DIAGNOSIS — G8929 Other chronic pain: Secondary | ICD-10-CM | POA: Diagnosis not present

## 2023-03-30 DIAGNOSIS — G9050 Complex regional pain syndrome I, unspecified: Secondary | ICD-10-CM | POA: Diagnosis not present

## 2023-03-30 DIAGNOSIS — Z79891 Long term (current) use of opiate analgesic: Secondary | ICD-10-CM | POA: Diagnosis not present

## 2023-04-04 DIAGNOSIS — E113291 Type 2 diabetes mellitus with mild nonproliferative diabetic retinopathy without macular edema, right eye: Secondary | ICD-10-CM | POA: Diagnosis not present

## 2023-04-04 DIAGNOSIS — H04123 Dry eye syndrome of bilateral lacrimal glands: Secondary | ICD-10-CM | POA: Diagnosis not present

## 2023-04-04 DIAGNOSIS — H43813 Vitreous degeneration, bilateral: Secondary | ICD-10-CM | POA: Diagnosis not present

## 2023-04-04 DIAGNOSIS — H52203 Unspecified astigmatism, bilateral: Secondary | ICD-10-CM | POA: Diagnosis not present

## 2023-04-04 DIAGNOSIS — H524 Presbyopia: Secondary | ICD-10-CM | POA: Diagnosis not present

## 2023-04-04 DIAGNOSIS — H5213 Myopia, bilateral: Secondary | ICD-10-CM | POA: Diagnosis not present

## 2023-04-05 LAB — HM DIABETES EYE EXAM

## 2023-04-11 ENCOUNTER — Ambulatory Visit: Payer: BLUE CROSS/BLUE SHIELD | Admitting: Primary Care

## 2023-04-11 NOTE — Progress Notes (Deleted)
@Patient  ID: Nichole Delgado, female    DOB: 1951/08/30, 72 y.o.   MRN: [id number]  No chief complaint on file.   Referring provider: Ardith Dark, MD  HPI: 72 yo active smoker followed for DOE, pulmonary nodule and chronic bronchitis vs asthma   She continues to smoke about a pack per day, more than 50 pack years     PMH -complex regional pain syndrome after a fall and fractures of both ankles with right foot drop status post spinal cord stimulator, -IBS with diarrhea   Based on bronchodilator response on PFTs, she was started on Symbicort with a diagnosis of asthma.  However this caused her heart to race and she was switched to Advair. 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    45-month follow-up visit. Interim she had surgery to remove hardware in the right right, she had a urinary bladder tumor removed.  She required intubation and wonders if this hurt her vocal cords. We reviewed PET scan and follow-up CT scan results.  She underwent ENT evaluation which did not show any vocal cord nodule -reviewed Dr. Pollyann Ferrin consultation?  Artifact She continues to smoke about a pack per day.  Her husband and daughter smoke.  She inquires about vaping.  She reports a cough for 2 weeks, clear sputum production, known sick contacts    04/11/2023 Patient presents today for follow-up. Follows with Dr. Vassie Loll for Asthma / Pulmonary nodule.  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  Improvement in small nodular opacities on the right.  CT chest 06/2020 stable 10 x 7 mm left upper lobe nodule   PFTs  05/27/2022 : FVC 65, FEV1 65, ratio 75.  positive bronchodilator response (19%)   01/2018 FVC 2.10 [81%], FEV1 1.64 [84%], F/F 78, TLC 91%, RV/TLC 128%, DLCO 81% Minimal obstruction, small airways disease   FENO 02/13/18-7      Allergies  Allergen Reactions   Penicillins Hives    Has patient had a PCN reaction causing immediate rash, facial/tongue/throat swelling, SOB or lightheadedness with hypotension: No Has patient had a PCN reaction causing severe rash involving mucus membranes or skin necrosis: Yes Has patient had a PCN reaction that required hospitalization No Has patient had a PCN reaction occurring within the last 10 years: No If all of the above answers are "NO", then may proceed with Cephalosporin use.    Sulfa Antibiotics Hives and Rash   Budesonide-Formoterol Fumarate     Other reaction(s): covid symptoms   Fluticasone-Salmeterol     Other reaction(s): covid symptoms   Codeine Nausea Only   Hydrocodone Rash   Keppra [Levetiracetam] Rash   Other Other (  See Comments)    States she has allergies to other seizure medications but doesn't know which ones   Topamax [Topiramate] Nausea Only    Weight loss    Immunization History  Administered Date(s) Administered   COVID-19, mRNA, vaccine(Comirnaty)12 years and older 02/25/2023   Fluad Quad(high Dose 65+) 11/26/2020, 09/23/2022   Influenza Split 09/24/2015, 10/09/2017, 09/24/2019   Influenza, High Dose Seasonal PF 09/26/2014, 09/19/2017   Influenza,inj,Quad PF,6-35 Mos 10/10/2018   Influenza-Unspecified 10/20/2021   PFIZER(Purple Top)SARS-COV-2  Vaccination 01/28/2020, 02/22/2020   PNEUMOCOCCAL CONJUGATE-20 08/19/2022   Pfizer Covid-19 Vaccine Bivalent Booster 65yrs & up 01/04/2022   Pneumococcal Polysaccharide-23 09/30/2001   Tdap 04/19/2008, 08/19/2022   Unspecified SARS-COV-2 Vaccination 01/28/2020, 02/22/2020, 10/20/2020   Zoster, Live 03/22/2008, 03/22/2012    Past Medical History:  Diagnosis Date   Aortic atherosclerosis    Asthma    pulmonologist--- dr Vassie Loll  (12-27-2022  per pt last has been a while since last exacerbation)   Cervical spondylosis    Complex regional pain syndrome I    followed by pain clinic, pcp,  has spinal cord stimulator   Coronary artery calcification seen on CAT scan    cardiologist--- dr Rennis Golden;   nuclear stress test in epic 08-07-2022 low risk noishcemia, nuclear ef 73%   DDD (degenerative disc disease), lumbar    GAD (generalized anxiety disorder)    GERD (gastroesophageal reflux disease)    History of basal cell carcinoma (BCC) excision    per pt moh's sx on nose in 2020   History of chronic bronchitis    uses inhaler prn   Hypertension    followed by pcp and dr hilty   Irritable bowel syndrome with diarrhea    followed by dr pyrtle  (GI)   Lesion of bladder    urologist--- dr pace   Migraines    neurology-- [facility] in [place]   Mixed dyslipidemia    followed by lipid clinic--- dr Rennis Golden   Neuropathy    feet   OA (osteoarthritis)    PONV (postoperative nausea and vomiting)    severe   Pre-diabetes    S/P insertion of spinal 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   SUI (stress urinary incontinence, female)    Thyroid cyst    04-24-2021  pt stated recently dx and has been referred to endocrinologist   Vertigo    Wears glasses     Tobacco History: Social History   Tobacco Use  Smoking Status Every Day   Packs/day: 1.00   Years: 50.00   Additional pack years: 0.00   Total pack years: 50.00   Types: Cigarettes   Last attempt to quit: 08/24/2022   Years since  quitting: 0.6   Passive exposure: Never  Smokeless Tobacco Never  Tobacco Comments   Pt states that she smokes 1 pack a day as of 01/20/2023 Almetta Lovely, CMA   Ready to quit: Not Answered Counseling given: Not Answered Tobacco comments: Pt states that she smokes 1 pack a day as of 01/20/2023 Almetta Lovely, CMA   Outpatient Medications Prior to Visit  Medication Sig Dispense Refill   albuterol (VENTOLIN HFA) 108 (90 Base) MCG/ACT inhaler Inhale 2 puffs into the lungs every 6 (six) hours as needed for wheezing or shortness of breath. 6.7 g 2   Ascorbic Acid (VITAMIN C) 500 MG CAPS Take 1 tablet by mouth daily.     Ca Phosphate-Cholecalciferol 250-500 MG-UNIT CHEW Chew 1 each by mouth daily.     Cholecalciferol (VITAMIN D3)  50 MCG (2000 UT) TABS Take 1 tablet by mouth daily.     COVID-19 mRNA vaccine 2023-2024 (COMIRNATY) syringe Inject into the muscle. 0.3 mL 0   dicyclomine (BENTYL) 10 MG capsule Take 1-2 capsules (10-20 mg total) by mouth 3 (three) times daily as needed for spasms. (Patient taking differently: Take 10-20 mg by mouth 3 (three) times daily as needed for spasms.) 90 capsule 3   Eluxadoline (VIBERZI) 75 MG TABS Take 1 tablet (75 mg total) by mouth 2 (two) times daily. 180 tablet 3   estradiol (CLIMARA - DOSED IN MG/24 HR) 0.1 mg/24hr patch Place 0.1 mg onto the skin 2 (two) times a week.     fluticasone (FLONASE) 50 MCG/ACT nasal spray Place 1 spray into both nostrils daily.     fluticasone furoate-vilanterol (BREO ELLIPTA) 100-25 MCG/ACT AEPB Inhale 1 puff into the lungs daily. 180 each 3   fluticasone furoate-vilanterol (BREO ELLIPTA) 200-25 MCG/ACT AEPB Inhale 1 puff into the lungs daily. 1 each 6   ibuprofen (ADVIL,MOTRIN) 200 MG tablet Take 400 mg by mouth every 6 (six) hours as needed for mild pain.     loperamide (IMODIUM) 2 MG capsule Take 2 mg by mouth as needed.     losartan (COZAAR) 25 MG tablet Take 1 tablet (25 mg total) by mouth daily. (Patient taking  differently: Take 25 mg by mouth at bedtime.) 90 tablet 3   meclizine (ANTIVERT) 25 MG tablet Take 25 mg by mouth 3 (three) times daily as needed for dizziness.     ondansetron (ZOFRAN) 8 MG tablet Take by mouth every 8 (eight) hours as needed for nausea or vomiting.     perphenazine (TRILAFON) 2 MG tablet Take 2 mg by mouth at bedtime.     pravastatin (PRAVACHOL) 40 MG tablet TAKE 1 TABLET BY MOUTH AT BEDTIME 90 tablet 0   traMADol (ULTRAM) 50 MG tablet Take by mouth every 6 (six) hours as needed.     zolpidem (AMBIEN) 10 MG tablet Take 5 mg by mouth at bedtime as needed for sleep.     No facility-administered medications prior to visit.      Review of Systems  Review of Systems   Physical Exam  There were no vitals taken for this visit. Physical Exam   Lab Results:  CBC    Component Value Date/Time   WBC 7.9 05/27/2022 1435   RBC 4.61 05/27/2022 1435   HGB 15.0 12/28/2022 1006   HCT 44.0 12/28/2022 1006   PLT 252.0 05/27/2022 1435   MCV 91.7 05/27/2022 1435   MCH 30.9 08/04/2018 0647   MCHC 33.6 05/27/2022 1435   RDW 13.4 05/27/2022 1435   LYMPHSABS 2.6 05/27/2022 1435   MONOABS 0.6 05/27/2022 1435   EOSABS 0.1 05/27/2022 1435   BASOSABS 0.1 05/27/2022 1435    BMET    Component Value Date/Time   NA 139 12/28/2022 1006   NA 141 06/12/2020 0000   K 3.8 12/28/2022 1006   CL 102 12/28/2022 1006   CO2 27 06/12/2021 1427   GLUCOSE 100 (H) 12/28/2022 1006   BUN 12 12/28/2022 1006   BUN 13 06/12/2020 0000   CREATININE 1.00 12/28/2022 1006   CALCIUM 10.2 06/12/2021 1427   GFRNONAA 61 06/12/2020 0000   GFRAA 74 06/12/2020 0000    BNP No results found for: "BNP"  ProBNP No results found for: "PROBNP"  Imaging: MM 3D SCREEN BREAST BILATERAL  Result Date: 03/23/2023 CLINICAL DATA:  Screening. EXAM: DIGITAL SCREENING BILATERAL MAMMOGRAM  WITH TOMOSYNTHESIS AND CAD TECHNIQUE: Bilateral screening digital craniocaudal and mediolateral oblique mammograms were  obtained. Bilateral screening digital breast tomosynthesis was performed. The images were evaluated with computer-aided detection. COMPARISON:  Previous exam(s). ACR Breast Density Category c: The breasts are heterogeneously dense, which may obscure small masses. FINDINGS: There are no findings suspicious for malignancy. IMPRESSION: No mammographic evidence of malignancy. A result letter of this screening mammogram will be mailed directly to the patient. RECOMMENDATION: Screening mammogram in one year. (Code:SM-B-01Y) BI-RADS CATEGORY  1: Negative. Electronically Signed   By: Baird Lyons M.D.   On: 03/23/2023 12:12     Assessment & Plan:   No problem-specific Assessment & Plan notes found for this encounter.     Glenford Bayley, NP 04/11/2023

## 2023-04-18 ENCOUNTER — Telehealth: Payer: Self-pay | Admitting: Primary Care

## 2023-04-18 DIAGNOSIS — M542 Cervicalgia: Secondary | ICD-10-CM | POA: Diagnosis not present

## 2023-04-18 DIAGNOSIS — M5412 Radiculopathy, cervical region: Secondary | ICD-10-CM | POA: Diagnosis not present

## 2023-04-18 NOTE — Telephone Encounter (Signed)
Pt calling in bc her ins is no longer covering for [facility] and pt will need a substitution  Pharmacy: [facility] [address]

## 2023-04-19 ENCOUNTER — Other Ambulatory Visit (HOSPITAL_COMMUNITY): Payer: Self-pay

## 2023-04-19 NOTE — Telephone Encounter (Signed)
Nichole Delgado is still covered, patient is in her deductible/initial benefit phase. Brand Advair HFA is also covered with the deductible/initial benefit phase making the co-pay about $200

## 2023-04-19 NOTE — Telephone Encounter (Signed)
Can we run a ticket on patients insurance to see what else is covered besides Breo?  Please route message back to triage

## 2023-04-20 NOTE — Telephone Encounter (Signed)
Pt called the office. Let her know the info per prior auth team and she verbalized understanding. Nothing further needed.

## 2023-04-21 ENCOUNTER — Telehealth: Payer: Self-pay | Admitting: Pulmonary Disease

## 2023-04-21 NOTE — Telephone Encounter (Signed)
Patient called to check if the pre-cert for patient's medication was received by fax last night.  She stated it was faxed yesterday at 5pm.  Please advise and call patient to let her know if received.  CB# [phone]

## 2023-04-25 ENCOUNTER — Telehealth: Payer: Self-pay | Admitting: Internal Medicine

## 2023-04-25 NOTE — Telephone Encounter (Signed)
Left message for pt to call back.  Pt wanted to know if there was anything new for abd pain/cramping. Discussed usually bentyl or levsin are used. Pt states this helped but knocks her out and she is not going to sleep her day away. Asked if she had tried tylenol but she states it does not work. States she will just have to continue taking the motrin.

## 2023-04-25 NOTE — Telephone Encounter (Signed)
PT is having some concerns about pain she has everyday after having a BM. It lasts about an hour but it is every day, day or night. The Viberzi helps the BM  but the constant taking of ibuprofen for the pain is the most concerning. Please advise.

## 2023-04-26 ENCOUNTER — Ambulatory Visit: Payer: BLUE CROSS/BLUE SHIELD | Admitting: Primary Care

## 2023-04-26 DIAGNOSIS — N958 Other specified menopausal and perimenopausal disorders: Secondary | ICD-10-CM | POA: Diagnosis not present

## 2023-04-26 DIAGNOSIS — F17210 Nicotine dependence, cigarettes, uncomplicated: Secondary | ICD-10-CM | POA: Diagnosis not present

## 2023-04-26 DIAGNOSIS — R29890 Loss of height: Secondary | ICD-10-CM | POA: Diagnosis not present

## 2023-04-26 DIAGNOSIS — M8588 Other specified disorders of bone density and structure, other site: Secondary | ICD-10-CM | POA: Diagnosis not present

## 2023-04-27 ENCOUNTER — Ambulatory Visit: Payer: BLUE CROSS/BLUE SHIELD | Admitting: Primary Care

## 2023-04-27 DIAGNOSIS — G9050 Complex regional pain syndrome I, unspecified: Secondary | ICD-10-CM | POA: Diagnosis not present

## 2023-04-27 DIAGNOSIS — Z79891 Long term (current) use of opiate analgesic: Secondary | ICD-10-CM | POA: Diagnosis not present

## 2023-04-27 DIAGNOSIS — M542 Cervicalgia: Secondary | ICD-10-CM | POA: Diagnosis not present

## 2023-04-27 DIAGNOSIS — M79671 Pain in right foot: Secondary | ICD-10-CM | POA: Diagnosis not present

## 2023-04-27 DIAGNOSIS — G8929 Other chronic pain: Secondary | ICD-10-CM | POA: Diagnosis not present

## 2023-04-29 NOTE — Telephone Encounter (Signed)
Attempted to call pt but unable to reach. Left message to return call.  

## 2023-05-02 ENCOUNTER — Other Ambulatory Visit (HOSPITAL_COMMUNITY): Payer: Self-pay

## 2023-05-02 NOTE — Telephone Encounter (Signed)
Patient is returning phone call.  °

## 2023-05-06 ENCOUNTER — Encounter: Payer: Self-pay | Admitting: Primary Care

## 2023-05-06 ENCOUNTER — Ambulatory Visit (INDEPENDENT_AMBULATORY_CARE_PROVIDER_SITE_OTHER): Payer: Medicare Other | Admitting: Primary Care

## 2023-05-06 ENCOUNTER — Telehealth: Payer: Self-pay | Admitting: Primary Care

## 2023-05-06 VITALS — BP 130/82 | HR 80 | Ht <= 58 in | Wt 129.6 lb

## 2023-05-06 DIAGNOSIS — R911 Solitary pulmonary nodule: Secondary | ICD-10-CM | POA: Diagnosis not present

## 2023-05-06 DIAGNOSIS — J4540 Moderate persistent asthma, uncomplicated: Secondary | ICD-10-CM

## 2023-05-06 DIAGNOSIS — Z9189 Other specified personal risk factors, not elsewhere classified: Secondary | ICD-10-CM

## 2023-05-06 MED ORDER — FLUTICASONE FUROATE-VILANTEROL 200-25 MCG/ACT IN AEPB: 1.0000 | INHALATION_SPRAY | Freq: Every day | RESPIRATORY_TRACT | 5 refills | Status: DC

## 2023-05-06 MED ORDER — PREDNISONE 10 MG PO TABS: ORAL_TABLET | ORAL | 0 refills | Status: AC

## 2023-05-06 NOTE — Telephone Encounter (Signed)
Can you please run a benefits investigation for any ICS/LAMA combination inhalers, patient is currently on Breo 200 mcg daily and is too expensive.  She does have a deductible that she has not met.  What would be the best options for her? We can try and apply for patient assistance? Are there any cheaper alternatives to breo until she meets her deductible

## 2023-05-06 NOTE — Assessment & Plan Note (Signed)
-   Asthma symptoms are typically well-controlled on Breo 200 mcg, however, currently on the 100 mcg dose due to insurance issues.  She is complaining of increased cough and congestion since being on lower dose of ICS/LABA.  She has tried and failed Symbicort and Advair .  Patient was provided paperwork for patient assistance and we have reached out to our pharmacy team about initiating a prior authorization for Breo 200.  Sending in low-dose prednisone x 10 days for subacute cough.  Follow-up in 6 months with Dr. Vassie Loll or sooner if needed.

## 2023-05-06 NOTE — Telephone Encounter (Signed)
Can we do a PA for BREO She has tried Symbicort and Advair 115

## 2023-05-06 NOTE — Telephone Encounter (Signed)
Called pt and no answer- LMTCB  

## 2023-05-06 NOTE — Patient Instructions (Addendum)
We have provided you with patient assistance for BREO We have also contacted pharmacy team with our office to initiate a prior auth  Sending in prednisone Take Claritin (loratadine) at bedtime  Follow-up 6 months or sooner with Dr. Vassie Loll

## 2023-05-06 NOTE — Telephone Encounter (Signed)
See previous encounter, PA not needed for Breo. Patient must meet deductible before price goes down.

## 2023-05-06 NOTE — Assessment & Plan Note (Addendum)
-   Pulmonary nodule is not hypermetabolic on PET but remains a concern for slow-growing malignancy given growth in size from 10 mm to 17 mm from 2021-2023.  Due for 67-month follow-up in June 2024.

## 2023-05-06 NOTE — Telephone Encounter (Signed)
Called the pt again and still no answer- LMTCB.

## 2023-05-06 NOTE — Progress Notes (Signed)
@Patient  ID: Nichole Delgado, female    DOB: [birth year], 72 y.o.   MRN: [id number]  Chief Complaint  Patient presents with   Follow-up    Referring provider: Ardith Dark, MD  HPI: 72 year old female, current everyday smoker (50-pack-year history).  Past medical history significant for moderate persistent asthma, chronic bronchitis, pulmonary nodule less than 1 cm, hypertension, hyperlipidemia, osteoporosis.  Dr. Vassie Loll, seen in February 2024.  05/06/2023 Patient presents today for follow-up. Asthma is typically well controlled on Breo but is currently on dose due to insurance issues. Insurance will not coverage generic for BREO. Patient has out of pocket deductible she has not met. She has look into going through [facility] and may be able to receive medication for 60 dollars after she has met deductible. She is on a limited income. She has tried Symbicort 80cmg which caused palpitations as well as Advair which gave her cold like symptoms. She is currently having issues with cough and congestion since being on lower dose of BREO.   Allergies  Allergen Reactions   Penicillins Hives    Has patient had a PCN reaction causing immediate rash, facial/tongue/throat swelling, SOB or lightheadedness with hypotension: No Has patient had a PCN reaction causing severe rash involving mucus membranes or skin necrosis: Yes Has patient had a PCN reaction that required hospitalization No Has patient had a PCN reaction occurring within the last 10 years: No If all of the above answers are "NO", then may proceed with Cephalosporin use.    Sulfa Antibiotics Hives and Rash   Budesonide-Formoterol Fumarate     Other reaction(s): covid symptoms   Fluticasone-Salmeterol     Other reaction(s): covid symptoms   Codeine Nausea Only   Hydrocodone Rash   Keppra [Levetiracetam] Rash   Other Other (See Comments)    States she has allergies to other seizure medications but doesn't know which ones    Topamax [Topiramate] Nausea Only    Weight loss    Immunization History  Administered Date(s) Administered   COVID-19, mRNA, vaccine(Comirnaty)12 years and older 02/25/2023   Fluad Quad(high Dose 65+) 11/26/2020, 09/23/2022   Influenza Split 09/24/2015, 10/09/2017, 09/24/2019   Influenza, High Dose Seasonal PF 09/26/2014, 09/19/2017   Influenza,inj,Quad PF,6-35 Mos 10/10/2018   Influenza-Unspecified 10/20/2021   PFIZER(Purple Top)SARS-COV-2 Vaccination 01/28/2020, 02/22/2020   PNEUMOCOCCAL CONJUGATE-20 08/19/2022   Pfizer Covid-19 Vaccine Bivalent Booster 31yrs & up 01/04/2022   Pneumococcal Polysaccharide-23 09/30/2001   Tdap 04/19/2008, 08/19/2022   Unspecified SARS-COV-2 Vaccination 01/28/2020, 02/22/2020, 10/20/2020   Zoster, Live 03/22/2008, 03/22/2012    Past Medical History:  Diagnosis Date   Aortic atherosclerosis (HCC)    Asthma    pulmonologist--- dr Vassie Loll  (12-27-2022  per pt last has been a while since last exacerbation)   Cervical spondylosis    Complex regional pain syndrome I    followed by pain clinic, pcp,  has spinal cord stimulator   Coronary artery calcification seen on CAT scan    cardiologist--- dr Rennis Golden;   nuclear stress test in epic 08-07-2022 low risk noishcemia, nuclear ef 73%   DDD (degenerative disc disease), lumbar    GAD (generalized anxiety disorder)    GERD (gastroesophageal reflux disease)    History of basal cell carcinoma (BCC) excision    per pt moh's sx on nose in 2020   History of chronic bronchitis    uses inhaler prn   Hypertension    followed by pcp and dr hilty   Irritable bowel syndrome  with diarrhea    followed by dr pyrtle  (GI)   Lesion of bladder    urologist--- dr pace   Migraines    neurology-- [facility] in [place]   Mixed dyslipidemia    followed by lipid clinic--- dr Rennis Golden   Neuropathy    feet   OA (osteoarthritis)    PONV (postoperative nausea and vomiting)    severe   Pre-diabetes    S/P  insertion of spinal 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   SUI (stress urinary incontinence, female)    Thyroid cyst    04-24-2021  pt stated recently dx and has been referred to endocrinologist   Vertigo    Wears glasses     Tobacco History: Social History   Tobacco Use  Smoking Status Every Day   Packs/day: 1.00   Years: 50.00   Additional pack years: 0.00   Total pack years: 50.00   Types: Cigarettes   Last attempt to quit: 08/24/2022   Years since quitting: 0.6   Passive exposure: Never  Smokeless Tobacco Never  Tobacco Comments   Smoking 1 ppd 05/06/23 BR   Ready to quit: Not Answered Counseling given: Not Answered Tobacco comments: Smoking 1 ppd 05/06/23 BR   Outpatient Medications Prior to Visit  Medication Sig Dispense Refill   albuterol (VENTOLIN HFA) 108 (90 Base) MCG/ACT inhaler Inhale 2 puffs into the lungs every 6 (six) hours as needed for wheezing or shortness of breath. 6.7 g 2   Ascorbic Acid (VITAMIN C) 500 MG CAPS Take 1 tablet by mouth daily.     Ca Phosphate-Cholecalciferol 250-500 MG-UNIT CHEW Chew 1 each by mouth daily.     Cholecalciferol (VITAMIN D3) 50 MCG (2000 UT) TABS Take 1 tablet by mouth daily.     COVID-19 mRNA vaccine 2023-2024 (COMIRNATY) syringe Inject into the muscle. 0.3 mL 0   dicyclomine (BENTYL) 10 MG capsule Take 1-2 capsules (10-20 mg total) by mouth 3 (three) times daily as needed for spasms. (Patient taking differently: Take 10-20 mg by mouth 3 (three) times daily as needed for spasms.) 90 capsule 3   Eluxadoline (VIBERZI) 75 MG TABS Take 1 tablet (75 mg total) by mouth 2 (two) times daily. 180 tablet 3   estradiol (CLIMARA - DOSED IN MG/24 HR) 0.1 mg/24hr patch Place 0.1 mg onto the skin 2 (two) times a week.     fluticasone (FLONASE) 50 MCG/ACT nasal spray Place 1 spray into both nostrils daily.     fluticasone furoate-vilanterol (BREO ELLIPTA) 100-25 MCG/ACT AEPB Inhale 1 puff into the lungs daily. 180 each 3   ibuprofen (ADVIL,MOTRIN) 200 MG  tablet Take 400 mg by mouth every 6 (six) hours as needed for mild pain.     loperamide (IMODIUM) 2 MG capsule Take 2 mg by mouth as needed.     losartan (COZAAR) 25 MG tablet Take 1 tablet (25 mg total) by mouth daily. (Patient taking differently: Take 25 mg by mouth at bedtime.) 90 tablet 3   meclizine (ANTIVERT) 25 MG tablet Take 25 mg by mouth 3 (three) times daily as needed for dizziness.     ondansetron (ZOFRAN) 8 MG tablet Take by mouth every 8 (eight) hours as needed for nausea or vomiting.     perphenazine (TRILAFON) 2 MG tablet Take 2 mg by mouth at bedtime.     pravastatin (PRAVACHOL) 40 MG tablet TAKE 1 TABLET BY MOUTH AT BEDTIME 90 tablet 0   traMADol (ULTRAM) 50 MG tablet Take by mouth every  6 (six) hours as needed.     zolpidem (AMBIEN) 10 MG tablet Take 5 mg by mouth at bedtime as needed for sleep.     fluticasone furoate-vilanterol (BREO ELLIPTA) 200-25 MCG/ACT AEPB Inhale 1 puff into the lungs daily. (Patient not taking: Reported on 05/06/2023) 1 each 6   No facility-administered medications prior to visit.    Review of Systems  Review of Systems  Constitutional: Negative.   HENT:  Positive for congestion.   Respiratory:  Positive for cough.   Cardiovascular: Negative.      Physical Exam  BP 130/82 (BP Location: Left Arm, Cuff Size: Normal)   Pulse 80   Ht 4\' 10"  (1.473 m)   Wt 129 lb 9.6 oz (58.8 kg)   SpO2 97%   BMI 27.09 kg/m  Physical Exam Constitutional:      Appearance: Normal appearance.  HENT:     Head: Normocephalic and atraumatic.  Cardiovascular:     Rate and Rhythm: Normal rate and regular rhythm.  Pulmonary:     Effort: Pulmonary effort is normal.     Breath sounds: Normal breath sounds. No wheezing or rales.  Musculoskeletal:        General: Normal range of motion.  Skin:    General: Skin is warm and dry.  Neurological:     General: No focal deficit present.     Mental Status: She is alert and oriented to person, place, and time. Mental  status is at baseline.  Psychiatric:        Mood and Affect: Mood normal.        Behavior: Behavior normal.        Thought Content: Thought content normal.        Judgment: Judgment normal.      Lab Results:  CBC    Component Value Date/Time   WBC 7.9 05/27/2022 1435   RBC 4.61 05/27/2022 1435   HGB 15.0 12/28/2022 1006   HCT 44.0 12/28/2022 1006   PLT 252.0 05/27/2022 1435   MCV 91.7 05/27/2022 1435   MCH 30.9 08/04/2018 0647   MCHC 33.6 05/27/2022 1435   RDW 13.4 05/27/2022 1435   LYMPHSABS 2.6 05/27/2022 1435   MONOABS 0.6 05/27/2022 1435   EOSABS 0.1 05/27/2022 1435   BASOSABS 0.1 05/27/2022 1435    BMET    Component Value Date/Time   NA 139 12/28/2022 1006   NA 141 06/12/2020 0000   K 3.8 12/28/2022 1006   CL 102 12/28/2022 1006   CO2 27 06/12/2021 1427   GLUCOSE 100 (H) 12/28/2022 1006   BUN 12 12/28/2022 1006   BUN 13 06/12/2020 0000   CREATININE 1.00 12/28/2022 1006   CALCIUM 10.2 06/12/2021 1427   GFRNONAA 61 06/12/2020 0000   GFRAA 74 06/12/2020 0000    BNP No results found for: "BNP"  ProBNP No results found for: "PROBNP"  Imaging: No results found.   Assessment & Plan:   Moderate persistent asthma - Asthma symptoms are typically well-controlled on Breo 200 mcg, however, currently on the 100 mcg dose due to insurance issues.  She is complaining of increased cough and congestion since being on lower dose of ICS/LABA.  She has tried and failed Symbicort and Advair .  Patient was provided paperwork for patient assistance and we have reached out to our pharmacy team about initiating a prior authorization for Breo 200.  Sending in low-dose prednisone x 10 days for subacute cough.  Follow-up in 6 months with Dr. Vassie Loll  or sooner if needed.  Pulmonary nodule less than 1 cm in diameter with moderate to high risk for malignant neoplasm - Pulmonary nodule is not hypermetabolic on PET but remains a concern for slow-growing malignancy given  growth in size from 10 mm to 17 mm from 2021-2023.  Due for 62-month follow-up in June 2024.     Glenford Bayley, NP 05/06/2023

## 2023-05-10 NOTE — Telephone Encounter (Signed)
Called and spoke with pt about what pharmacy advised on breo, pt stated she will just go through financial assistance through [employer]. I encouraged pt to keep [place] informed and we can move forward from there. Pt verbalized understanding, nothing further needed at this time.

## 2023-05-11 DIAGNOSIS — L814 Other melanin hyperpigmentation: Secondary | ICD-10-CM | POA: Diagnosis not present

## 2023-05-11 DIAGNOSIS — L578 Other skin changes due to chronic exposure to nonionizing radiation: Secondary | ICD-10-CM | POA: Diagnosis not present

## 2023-05-11 DIAGNOSIS — L700 Acne vulgaris: Secondary | ICD-10-CM | POA: Diagnosis not present

## 2023-05-11 DIAGNOSIS — L821 Other seborrheic keratosis: Secondary | ICD-10-CM | POA: Diagnosis not present

## 2023-05-11 DIAGNOSIS — L910 Hypertrophic scar: Secondary | ICD-10-CM | POA: Diagnosis not present

## 2023-05-11 DIAGNOSIS — L820 Inflamed seborrheic keratosis: Secondary | ICD-10-CM | POA: Diagnosis not present

## 2023-05-11 DIAGNOSIS — D225 Melanocytic nevi of trunk: Secondary | ICD-10-CM | POA: Diagnosis not present

## 2023-05-11 DIAGNOSIS — L719 Rosacea, unspecified: Secondary | ICD-10-CM | POA: Diagnosis not present

## 2023-05-12 ENCOUNTER — Telehealth: Payer: Self-pay | Admitting: Pharmacist

## 2023-05-12 NOTE — Telephone Encounter (Signed)
Called and spoke with patient. Patient stated that her insurance had told her they would not cover the medication at all. Patient stated the manufacturer had called her and she['s got something worked out and for [place] to not worry about the pre cert for the medication.   Nothing further needed.

## 2023-05-12 NOTE — Progress Notes (Signed)
Care Management & Coordination Services Pharmacy Team  Reason for Encounter: General adherence update   Contacted patient for general health update and medication adherence call.  Spoke with patient on 05/12/2023    What concerns do you have about your medications? none  The patient denies side effects with their medications.   How often do you forget or accidentally miss a dose? Rarely  Do you use a pillbox? Yes  Are you having any problems getting your medications from your pharmacy? No  Has the cost of your medications been a concern? No If yes, what medication and is patient assistance available or has it been applied for?  Since last visit with PharmD, no interventions have been made.   The patient has not had an ED visit since last contact.   The patient denies problems with their health.   Patient denies concerns or questions for Erskine Emery, PharmD at this time.   Counseled patient on: Access to carecoordination team for any cost, medication or pharmacy concerns.  Patient declined to schedule a follow up telephone call with clinical pharmacist. Patient states she is leaving today to go to [place] as one of her family members is on life support. She states she will schedule said appointment when she returns.  Chart Updates:  Recent office visits:  None  Recent consult visits:  05/06/2023 OV (Pulmonology) Glenford Bayley, NP; Sending in low-dose prednisone x 10 days for subacute cough.   01/20/2023 [facility] (Pulmonology) Glenford Bayley, NP;Prednisone 10 mg tabs Take 2 tabs daily with food x 5ds, then 1 tab daily with food x 5ds then [facility] visits:  None since last adherence call  Medications: Outpatient Encounter Medications as of 05/12/2023  Medication Sig Note   albuterol (VENTOLIN HFA) 108 (90 Base) MCG/ACT inhaler Inhale 2 puffs into the lungs every 6 (six) hours as needed for wheezing or shortness of breath.    Ascorbic Acid (VITAMIN C) 500 MG CAPS  Take 1 tablet by mouth daily.    Ca Phosphate-Cholecalciferol 250-500 MG-UNIT CHEW Chew 1 each by mouth daily.    Cholecalciferol (VITAMIN D3) 50 MCG (2000 UT) TABS Take 1 tablet by mouth daily.    COVID-19 mRNA vaccine 2023-2024 (COMIRNATY) syringe Inject into the muscle.    dicyclomine (BENTYL) 10 MG capsule Take 1-2 capsules (10-20 mg total) by mouth 3 (three) times daily as needed for spasms. (Patient taking differently: Take 10-20 mg by mouth 3 (three) times daily as needed for spasms.)    Eluxadoline (VIBERZI) 75 MG TABS Take 1 tablet (75 mg total) by mouth 2 (two) times daily.    estradiol (CLIMARA - DOSED IN MG/24 HR) 0.1 mg/24hr patch Place 0.1 mg onto the skin 2 (two) times a week.    fluticasone (FLONASE) 50 MCG/ACT nasal spray Place 1 spray into both nostrils daily.    fluticasone furoate-vilanterol (BREO ELLIPTA) 100-25 MCG/ACT AEPB Inhale 1 puff into the lungs daily.    fluticasone furoate-vilanterol (BREO ELLIPTA) 200-25 MCG/ACT AEPB Inhale 1 puff into the lungs daily. (Patient not taking: Reported on 05/06/2023)    fluticasone furoate-vilanterol (BREO ELLIPTA) 200-25 MCG/ACT AEPB Inhale 1 puff into the lungs daily.    ibuprofen (ADVIL,MOTRIN) 200 MG tablet Take 400 mg by mouth every 6 (six) hours as needed for mild pain.    loperamide (IMODIUM) 2 MG capsule Take 2 mg by mouth as needed.    losartan (COZAAR) 25 MG tablet Take 1 tablet (25 mg total) by mouth  daily. (Patient taking differently: Take 25 mg by mouth at bedtime.)    meclizine (ANTIVERT) 25 MG tablet Take 25 mg by mouth 3 (three) times daily as needed for dizziness.    ondansetron (ZOFRAN) 8 MG tablet Take by mouth every 8 (eight) hours as needed for nausea or vomiting.    perphenazine (TRILAFON) 2 MG tablet Take 2 mg by mouth at bedtime.    pravastatin (PRAVACHOL) 40 MG tablet TAKE 1 TABLET BY MOUTH AT BEDTIME    predniSONE (DELTASONE) 10 MG tablet Take 2 tablets (20 mg total) by mouth daily with breakfast for 5 days, THEN  1 tablet (10 mg total) daily with breakfast for 5 days.    traMADol (ULTRAM) 50 MG tablet Take by mouth every 6 (six) hours as needed.    zolpidem (AMBIEN) 10 MG tablet Take 5 mg by mouth at bedtime as needed for sleep. 02/15/2022: Taking half a tablet   No facility-administered encounter medications on file as of 05/12/2023.    Recent vitals BP Readings from Last 3 Encounters:  05/06/23 130/82  02/14/23 129/65  01/20/23 116/68   Pulse Readings from Last 3 Encounters:  05/06/23 80  02/14/23 61  01/20/23 87   Wt Readings from Last 3 Encounters:  05/06/23 129 lb 9.6 oz (58.8 kg)  01/20/23 132 lb 12.8 oz (60.2 kg)  12/28/22 132 lb (59.9 kg)   BMI Readings from Last 3 Encounters:  05/06/23 27.09 kg/m  01/20/23 27.76 kg/m  12/28/22 27.59 kg/m    Recent lab results    Component Value Date/Time   NA 139 12/28/2022 1006   NA 141 06/12/2020 0000   K 3.8 12/28/2022 1006   CL 102 12/28/2022 1006   CO2 27 06/12/2021 1427   GLUCOSE 100 (H) 12/28/2022 1006   BUN 12 12/28/2022 1006   BUN 13 06/12/2020 0000   CREATININE 1.00 12/28/2022 1006   CALCIUM 10.2 06/12/2021 1427    Lab Results  Component Value Date   CREATININE 1.00 12/28/2022   GFR 58.62 (L) 06/12/2021   GFRNONAA 61 06/12/2020   GFRAA 74 06/12/2020   Lab Results  Component Value Date/Time   HGBA1C 5.8 03/31/2021 03:16 PM   HGBA1C 5.8 06/12/2020 12:00 AM    Lab Results  Component Value Date   CHOL 171 07/27/2022   HDL 80 07/27/2022   LDLCALC 73 07/27/2022   TRIG 100 07/27/2022   CHOLHDL 2.1 07/27/2022    Care Gaps: Annual wellness visit in last year? Yes  Star Rating Drugs:  Losartan 25 mg last filled 03/07/2023 90 DS Pravastatin 40 mg last filled 03/07/2023 90 DS   Future Appointments  Date Time Provider Department Center  05/20/2023  1:00 PM [facility] 1 [facility]   [name], [facility] Clinical Pharmacist Assistant [phone]

## 2023-05-20 ENCOUNTER — Ambulatory Visit (HOSPITAL_BASED_OUTPATIENT_CLINIC_OR_DEPARTMENT_OTHER): Payer: Medicare Other

## 2023-05-23 ENCOUNTER — Other Ambulatory Visit (HOSPITAL_BASED_OUTPATIENT_CLINIC_OR_DEPARTMENT_OTHER): Payer: Self-pay

## 2023-05-23 MED ORDER — FLUTICASONE FUROATE-VILANTEROL 100-25 MCG/ACT IN AEPB: 1.0000 | INHALATION_SPRAY | Freq: Every day | RESPIRATORY_TRACT | 3 refills | Status: DC

## 2023-06-01 DIAGNOSIS — G629 Polyneuropathy, unspecified: Secondary | ICD-10-CM | POA: Diagnosis not present

## 2023-06-01 DIAGNOSIS — M542 Cervicalgia: Secondary | ICD-10-CM | POA: Diagnosis not present

## 2023-06-01 DIAGNOSIS — M79671 Pain in right foot: Secondary | ICD-10-CM | POA: Diagnosis not present

## 2023-06-01 DIAGNOSIS — Z79891 Long term (current) use of opiate analgesic: Secondary | ICD-10-CM | POA: Diagnosis not present

## 2023-06-01 DIAGNOSIS — G8929 Other chronic pain: Secondary | ICD-10-CM | POA: Diagnosis not present

## 2023-06-01 DIAGNOSIS — G9050 Complex regional pain syndrome I, unspecified: Secondary | ICD-10-CM | POA: Diagnosis not present

## 2023-06-02 DIAGNOSIS — F250 Schizoaffective disorder, bipolar type: Secondary | ICD-10-CM | POA: Diagnosis not present

## 2023-06-06 ENCOUNTER — Telehealth: Payer: Self-pay | Admitting: Internal Medicine

## 2023-06-06 NOTE — Telephone Encounter (Signed)
PT is calling to get refill on Viberzi. She was told she needed a new prescription from the manufacturer, [employer] for the rest of the year. Please advise.

## 2023-06-06 NOTE — Telephone Encounter (Signed)
I called [employer] patient assistance and spoke with [place]. I inquired about the patient's prescription for Viberzi. Philis Pique states the patient requested her refill on 06/03/23 and it ships to the patient around 7-10 business days. Asked if [facility] can see more refills on file since the patient was told she did not have any left. [facility] states she can see more refills on file. Spoke with patient and informed her that she should be receiving her prescription shortly according to [employer]. Patient verbalized understanding.

## 2023-06-07 ENCOUNTER — Telehealth (INDEPENDENT_AMBULATORY_CARE_PROVIDER_SITE_OTHER): Payer: Medicare Other | Admitting: Family Medicine

## 2023-06-07 ENCOUNTER — Encounter: Payer: Self-pay | Admitting: Family Medicine

## 2023-06-07 VITALS — Ht <= 58 in | Wt 127.0 lb

## 2023-06-07 DIAGNOSIS — G5770 Causalgia of unspecified lower limb: Secondary | ICD-10-CM

## 2023-06-07 NOTE — Assessment & Plan Note (Signed)
Following with pain management for her spinal cord stimulator and they are currently giving her tramadol 50 mg every 6 hours as needed.  She is concerned that her symptoms are starting to do reports her body and would like to be seen by a physiatrist for further evaluation and management.  Will place referral today.

## 2023-06-07 NOTE — Progress Notes (Signed)
   Nichole Delgado is a 72 y.o. female who presents today for a virtual office visit.  Assessment/Plan:  Chronic Problems Addressed Today: CRPS (complex regional pain syndrome), lower limb with spinal cord simulator Following with pain management for her spinal cord stimulator and they are currently giving her tramadol 50 mg every 6 hours as needed.  She is concerned that her symptoms are starting to do reports her body and would like to be seen by a physiatrist for further evaluation and management.  Will place referral today.     Subjective:  HPI:  See Assessment / plan for status of chronic conditions. Her main concern today is pain management. She does have a history of complex regional pain syndrome related to a accident 6 years ago. She does have a spinal cord stimulator in place and is following with the pain clinic for this but she is interested in exploring other options to help with pain control. They are currently prescribing 50 mg tramadol every 6 hours. She would like to be referred to a physiatrist to explore other options for treatment for her pain.       Objective/Observations  Physical Exam: Gen: NAD, resting comfortably Pulm: Normal work of breathing Neuro: Grossly normal, moves all extremities Psych: Normal affect and thought content  Virtual Visit via Video   I connected with Nichole Delgado on 06/07/23 at  1:00 PM EDT by a video enabled telemedicine application and verified that I am speaking with the correct person using two identifiers. The limitations of evaluation and management by telemedicine and the availability of in person appointments were discussed. The patient expressed understanding and agreed to proceed.   Patient location: Home Provider location: [place] [employer] Persons participating in the virtual visit: Myself and Patient     [name]. Jimmey Ralph, MD 06/07/2023 1:20 PM

## 2023-06-13 ENCOUNTER — Other Ambulatory Visit (HOSPITAL_BASED_OUTPATIENT_CLINIC_OR_DEPARTMENT_OTHER): Payer: Self-pay

## 2023-06-13 ENCOUNTER — Other Ambulatory Visit: Payer: Self-pay | Admitting: Family Medicine

## 2023-06-13 DIAGNOSIS — Z8669 Personal history of other diseases of the nervous system and sense organs: Secondary | ICD-10-CM | POA: Diagnosis not present

## 2023-06-13 DIAGNOSIS — M26609 Unspecified temporomandibular joint disorder, unspecified side: Secondary | ICD-10-CM | POA: Diagnosis not present

## 2023-06-13 MED ORDER — FLUTICASONE FUROATE-VILANTEROL 100-25 MCG/ACT IN AEPB: 1.0000 | INHALATION_SPRAY | Freq: Every day | RESPIRATORY_TRACT | 3 refills | Status: DC

## 2023-06-15 DIAGNOSIS — T8484XA Pain due to internal orthopedic prosthetic devices, implants and grafts, initial encounter: Secondary | ICD-10-CM | POA: Diagnosis not present

## 2023-06-15 DIAGNOSIS — M25572 Pain in left ankle and joints of left foot: Secondary | ICD-10-CM | POA: Diagnosis not present

## 2023-06-20 ENCOUNTER — Encounter: Payer: Self-pay | Admitting: Physical Medicine & Rehabilitation

## 2023-06-30 ENCOUNTER — Other Ambulatory Visit: Payer: Self-pay | Admitting: Internal Medicine

## 2023-06-30 DIAGNOSIS — E782 Mixed hyperlipidemia: Secondary | ICD-10-CM

## 2023-07-06 DIAGNOSIS — M79671 Pain in right foot: Secondary | ICD-10-CM | POA: Diagnosis not present

## 2023-07-06 DIAGNOSIS — Z79891 Long term (current) use of opiate analgesic: Secondary | ICD-10-CM | POA: Diagnosis not present

## 2023-07-06 DIAGNOSIS — G8929 Other chronic pain: Secondary | ICD-10-CM | POA: Diagnosis not present

## 2023-07-06 DIAGNOSIS — G9050 Complex regional pain syndrome I, unspecified: Secondary | ICD-10-CM | POA: Diagnosis not present

## 2023-07-06 DIAGNOSIS — G629 Polyneuropathy, unspecified: Secondary | ICD-10-CM | POA: Diagnosis not present

## 2023-07-06 DIAGNOSIS — M542 Cervicalgia: Secondary | ICD-10-CM | POA: Diagnosis not present

## 2023-07-11 ENCOUNTER — Telehealth: Payer: Self-pay | Admitting: Primary Care

## 2023-07-11 NOTE — Telephone Encounter (Signed)
PT calling saying she had a CT scan in march/April for her neck and it included her lungs. Can we use that one at her next appt? If not please call to set up a new CT since she had to cancel the last one.   Her # is [phone]

## 2023-07-14 NOTE — Telephone Encounter (Signed)
PT calling saying she had a CT scan in march/April for her neck and it included her lungs. Can we use that one at her next appt? If not please call to set up a new CT since she had to cancel the last one.    Beth can you please advise

## 2023-07-15 NOTE — Telephone Encounter (Signed)
Still needs it - its to follow-up on nodule from PET CT, high risk

## 2023-07-18 ENCOUNTER — Other Ambulatory Visit: Payer: Self-pay

## 2023-07-18 DIAGNOSIS — Z9189 Other specified personal risk factors, not elsewhere classified: Secondary | ICD-10-CM

## 2023-07-18 NOTE — Telephone Encounter (Signed)
Spoke with patient's husband regarding prior message. Advise patient's husband that patient will need to do another CT scan and order is in the system per [facility]. Patient's husbands voice was understanding.Nothing else further needed at this time.

## 2023-07-19 ENCOUNTER — Encounter: Payer: Medicare Other | Admitting: Physical Medicine & Rehabilitation

## 2023-07-21 ENCOUNTER — Encounter: Payer: Self-pay | Admitting: Physical Medicine & Rehabilitation

## 2023-07-21 ENCOUNTER — Encounter: Payer: Medicare Other | Attending: Physical Medicine & Rehabilitation | Admitting: Physical Medicine & Rehabilitation

## 2023-07-21 VITALS — BP 138/87 | HR 75 | Ht 59.0 in | Wt 127.8 lb

## 2023-07-21 DIAGNOSIS — M5442 Lumbago with sciatica, left side: Secondary | ICD-10-CM | POA: Diagnosis not present

## 2023-07-21 DIAGNOSIS — R202 Paresthesia of skin: Secondary | ICD-10-CM | POA: Diagnosis not present

## 2023-07-21 DIAGNOSIS — M5441 Lumbago with sciatica, right side: Secondary | ICD-10-CM | POA: Diagnosis not present

## 2023-07-21 DIAGNOSIS — G8929 Other chronic pain: Secondary | ICD-10-CM | POA: Diagnosis not present

## 2023-07-21 DIAGNOSIS — G90523 Complex regional pain syndrome I of lower limb, bilateral: Secondary | ICD-10-CM | POA: Insufficient documentation

## 2023-07-21 NOTE — Progress Notes (Signed)
Subjective:    Patient ID: Nichole Delgado, female    DOB: 1951-05-02, 72 y.o.   MRN: [id number]  HPI  HPI  Nichole Delgado is a 72 y.o. 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   They are presenting to PM&R clinic as a new patient for pain management evaluation. They were referred by Dr. Jimmey Ralph for treatment of chronic leg pain.  Nichole Delgado reports her pain began around 2018 when she fell through the attic floor breaking her ankles.  Patient had displaced comminuted fractures right fibular and posterior calcaneus and displaced fracture of the left medial malleolus and displaced fracture of the distal fibula.  On 03/27/2017 she had ORIF bilateral bimalleolar ankle fractures by Dr. Eulah Pont.  She completed treatment at [facility] after this surgery.  Since this time she is had pain in her feet that is stabbing, numb, tingling, aching in quality.  Pain is present all the time.  She is concerned because now pain is moving higher up into her ankles.  She later had surgery to remove some of the surgical hardware from her right ankle and she feels like this helped her range of motion in her ankle.  She still has surgical hardware in her left ankle.  She previously was seen by Dr. Annie Sable neurosurgery.  She reports she was diagnosed with  CPRS and had a spinal cord stimulator placed.  She reports this device helped in the past but is no longer providing benefit.  She also has a history of chronic lower back pain and had a posterior lumbar fusion L5-S1 completed by Dr. Noel Gerold. She reports she is generally an active person.  Chart review indicates she had a negative EMG and nerve conduction studies of bilateral lower extremities in 2019 by neurology  She is currently getting tramadol 50 mg from different pain provider would like to continue following with this provider for her tramadol.  Red flag symptoms: No red flags for back pain endorsed in Hx or ROS  Medications tried: Topical medications - denies  Nsaids - helps with IBS with diarrhea  Tylenol  doesn't help Opiates- Since 2019 tramadol helps a little  Lyrica didn't help Topamax nausea Gapentin- drowsy 2 other seizure medicines caused rash TCAs  - nortriptyline- caused caused confusion , cognitive slowing  SNRIs - She doesn't want to try antidepressant medications   Other treatments: PT/OT  - last year after ankle surgery with mild benefit TENs unit- Denies use of this previously  Injections - She had cortisone into her ankles  Spinal cord stimulator- doesn't help much  Sympathic block ? Unsure about    Prior UDS results: No results found for: "LABOPIA", "COCAINSCRNUR", "LABBENZ", "AMPHETMU", "THCU", "LABBARB"   Deformity R great toe points in Patchy gold sensastion loss   Pain Inventory Average Pain 7 Pain Right Now 7 My pain is burning, dull, stabbing, tingling, and aching  In the last 24 hours, has pain interfered with the following? General activity 6 Relation  with others 5 Enjoyment of life 3 What TIME of day is your pain at its worst? morning  and night Sleep (in general) Fair  Pain is worse with: walking, sitting, standing, and some activites Pain improves with: rest, medication, and injections Relief from Meds: 7  walk without  assistance how many minutes can you walk? 30 ability to climb steps?  yes do you drive?  yes  not employed: date last employed 2015 I need assistance with the following:  household duties and shopping  weakness numbness tingling trouble walking  Any changes since last visit?  no  Any changes since last visit?  no    Family History  Problem Relation Age of Onset   Migraines Mother    Hypertension Mother    Hyperlipidemia Mother    Heart attack Father    Hypertension Father    Hyperlipidemia Father    Pancreatic cancer Sister    Ulcerative colitis Sister    Liver cancer Brother    Migraines Daughter    Vascular Disease Daughter    Hypertension Daughter    Hyperlipidemia Daughter    Breast cancer Cousin    Crohn's disease Nephew    Social History   Socioeconomic History   Marital status: Married    Spouse name: Not on file   Number of children: 1   Years of education: 12   Highest education level: Not on file  Occupational History   Occupation: retired  Tobacco Use   Smoking status: Every Day    Current packs/day: 0.00    Average packs/day: 1 pack/day for 50.0 years (50.0 ttl pk-yrs)    Types: Cigarettes    Start date: 08/24/1972    Last attempt to quit: 08/24/2022    Years since quitting: 0.9    Passive exposure: Never   Smokeless tobacco: Never   Tobacco comments:    Smoking 1 ppd 05/06/23 BR  Vaping Use   Vaping status: Every Day  Substance and Sexual Activity   Alcohol use: Not Currently    Alcohol/week: 0.0 standard drinks of alcohol   Drug use: Never   Sexual activity: Not on file  Other Topics Concern   Not on file  Social History Narrative   Lives at home with her husband   Right-handed   Drinks about 5 cups of coffee per day   Social Determinants of Health   Financial Resource Strain: Low Risk  (08/09/2022)   Overall Financial Resource Strain (CARDIA)    Difficulty of Paying Living Expenses: Not hard at all  Food Insecurity: No Food  Insecurity (08/09/2022)   Hunger Vital Sign    Worried About Running Out of Food in the Last Year: Never true    Ran Out of Food in the Last Year: Never true  Transportation Needs: No Transportation Needs (08/09/2022)   PRAPARE - Administrator, Civil Service (Medical): No    Lack of Transportation (Non-Medical): No  Physical Activity: Unknown (08/09/2022)   Exercise Vital Sign    Days of Exercise per Week: 0 days    Minutes of Exercise per Session: Not on file  Stress: No Stress Concern Present (08/09/2022)   [employer] of Occupational Health - Occupational Stress Questionnaire    Feeling of Stress : Not at all  Social Connections: Moderately Isolated (08/09/2022)   Social Connection and Isolation Panel [NHANES]    Frequency of Communication with Friends and Family: More than three times a week    Frequency of  Social Gatherings with Friends and Family: More than three times a week    Attends Religious Services: Never    Database administrator or Organizations: No    Attends Engineer, structural: Never    Marital Status: Married   Past Surgical History:  Procedure Laterality Date   ANKLE HARDWARE REMOVAL Right 08/25/2022   CATARACT EXTRACTION W/ INTRAOCULAR LENS  IMPLANT, BILATERAL  2005   COLONOSCOPY W/ POLYPECTOMY     CYSTOSCOPY N/A 12/28/2022   Procedure: CYSTOSCOPY;  Surgeon: Noel Christmas, MD;  Location: [facility];  Service: Urology;  Laterality: N/A;   CYSTOSCOPY WITH BIOPSY N/A 05/12/2021   Procedure: CYSTOSCOPY WITH BIOPSY AND FULGERATION;  Surgeon: Noel Christmas, MD;  Location: [facility];  Service: Urology;  Laterality: N/A;  30 MINS   FOOT SURGERY Right 2011   hammer toe   ORIF ANKLE FRACTURE Left 03/27/2017   Procedure: OPEN REDUCTION INTERNAL FIXATION (ORIF) BIMALLEOLAR ANKLE FRACTURE;  Surgeon: Sheral Apley, MD;  Location: [facility] OR;  Service: Orthopedics;  Laterality: Left;   ORIF CALCANEOUS FRACTURE  Right 04/05/2017   Procedure: OPEN REDUCTION INTERNAL FIXATION (ORIF) CALCANEOUS AND FIBULA FRACTURE;  Surgeon: Sheral Apley, MD;  Location: [facility] OR;  Service: Orthopedics;  Laterality: Right;   POSTERIOR LUMBAR FUSION  09-08-2020  @[facility]    L5--S1   SPINAL CORD STIMULATOR INSERTION  08/15/2018   SUPRACERVICAL ABDOMINAL HYSTERECTOMY  1988   Partial   TONSILLECTOMY AND ADENOIDECTOMY  child   TRANSURETHRAL RESECTION OF BLADDER TUMOR N/A 12/28/2022   Procedure: TRANSURETHRAL RESECTION OF BLADDER TUMOR (TURBT);  Surgeon: Noel Christmas, MD;  [facility];  Service: Urology;  Laterality: N/A;  45 MINS   Past Medical History:  Diagnosis Date   Aortic atherosclerosis (HCC)    Asthma    pulmonologist--- dr Vassie Loll  (12-27-2022  per pt last has been a while since last exacerbation)   Cervical spondylosis    Complex regional pain syndrome I    followed by pain clinic, pcp,  has spinal cord stimulator   Coronary artery calcification seen on CAT scan    cardiologist--- dr Rennis Golden;   nuclear stress test in epic 08-07-2022 low risk noishcemia, nuclear ef 73%   DDD (degenerative disc disease), lumbar    GAD (generalized anxiety disorder)    GERD (gastroesophageal reflux disease)    History of basal cell carcinoma (BCC) excision    per pt moh's sx on nose in 2020   History of chronic bronchitis    uses inhaler prn   Hypertension    followed by pcp and dr hilty   Irritable bowel syndrome with diarrhea    followed by dr Rhea Belton  (GI)   Lesion of bladder    urologist--- dr pace   Migraines    neurology-- [facility] in [place]   Mixed dyslipidemia    followed by lipid clinic--- dr Rennis Golden   Neuropathy    feet   OA (osteoarthritis)    PONV (postoperative nausea and vomiting)    severe   Pre-diabetes    S/P insertion of spinal 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   SUI (stress urinary incontinence, female)    Thyroid cyst    04-24-2021  pt stated recently dx and has  been referred to endocrinologist   Vertigo    Wears glasses    BP 138/87   Pulse 75   Ht 4\' 11"  (1.499 m)   Wt 127 lb 12.8  oz (58 kg)   SpO2 94%   BMI 25.81 kg/m   Opioid Risk Score:   Fall Risk Score:  `1  Depression screen PHQ 2/9     07/21/2023    1:25 PM 06/07/2023   12:52 PM 09/23/2022   11:16 AM 08/09/2022   12:02 PM 11/02/2021    9:47 AM 10/22/2021    2:09 PM 07/27/2021   11:50 AM  Depression screen PHQ 2/9  Decreased Interest 1 0 0 0 0 0 0  Down, Depressed, Hopeless 1 0 0 0 0 0 1  PHQ - 2 Score 2 0 0 0 0 0 1  Altered sleeping 1        Tired, decreased energy 2        Change in appetite 2        Feeling bad or failure about yourself  0        Trouble concentrating 0        Moving slowly or fidgety/restless 0        Suicidal thoughts 0        PHQ-9 Score 7        Difficult doing work/chores Somewhat difficult          Review of Systems  Constitutional:  Positive for appetite change.       Poor  HENT: Negative.    Eyes: Negative.   Respiratory: Negative.    Cardiovascular: Negative.   Gastrointestinal:  Positive for abdominal pain, diarrhea and nausea.  Endocrine: Negative.   Genitourinary: Negative.   Musculoskeletal:  Positive for back pain and gait problem.  Skin: Negative.   Neurological:  Positive for weakness and numbness.       Tingling  Hematological: Negative.   Psychiatric/Behavioral:  Positive for dysphoric mood.        Objective:   Physical Exam   Gen: no distress, normal appearing HEENT: oral mucosa pink and moist, NCAT Cardio: Reg rate Chest: normal effort, normal rate of breathing Abd: soft, non-distended Ext: no edema Psych: pleasant, normal affect Skin: intact Neuro: Alert and awake, follows commands, cranial nerves II through XII grossly intact, normal speech and language RUE: 5/5 Deltoid, 5/5 Biceps, 5/5 Triceps, 5/5 Wrist Ext, 5/5 Grip LUE: 5/5 Deltoid, 5/5 Biceps, 5/5 Triceps, 5/5 Wrist Ext, 5/5 Grip RLE: HF 5/5, KE 5/5, KF  5/5, ADF 4/5, APF 4/5 LLE: HF 5/5, KE 5/5, HF, 5/5, ADF 4/5, APF 4/5 -Ankle motor testing pain limited Sensory exam normal for light touch and pain in bilateral upper extremities.  She appears to have decreased sensation to light touch and cold at her ankles and below and more of a stocking glove type distribution. Monofilament testing with patchy decreased sensation on her plantar feet bilaterally No limb ataxia or cerebellar signs. No abnormal tone appreciated.  DTR normal and symmetric bilaterally Musculoskeletal:  SLR negative Mild paraspinal lumbar tenderness Tenderness to palpation bilateral feet and ankles Gait is normal, no assistive devices No vasomotor symptoms noted today       Assessment & Plan:  Assessment 1) History of complex regional pain syndrome with persistent tingling in bilateral lower extremities -She currently has a spinal cord stimulator and tramadol from different pain provider.   -Patient is primarily concerned with worsening of her symptoms as she feels that she is having more ankle involvement than she did previously.  She has failed multiple other oral medications. -Does not sound like she has had sympathetic nerve blocks completed previous  2) Chronic lower  back pain with radiculopathy status post posterior fusion lumbar spine  3) Altered sensation in bilateral feet  Plan -Initially discussed trying Cymbalta however patient declines because she says she knows people who did not tolerate this well.  Chart review indicates she may have used this previously herself. -Asked patient to request release of information from [facility] for her prior spinal imaging, notes recent MRI if available -Will refer to Dr. Wynn Banker or Dr. Shearon Stalls for repeat EMG/nerve conduction study of her bilateral lower extremities for further evaluation of her worsening pain and paresthesias in her lower extremities.  Patient has altered sensation in her bilateral feet would  like to rule out polyneuropath/ radiculopathy. -Continue home exercise program

## 2023-07-22 DIAGNOSIS — H9202 Otalgia, left ear: Secondary | ICD-10-CM | POA: Diagnosis not present

## 2023-07-22 DIAGNOSIS — H90A22 Sensorineural hearing loss, unilateral, left ear, with restricted hearing on the contralateral side: Secondary | ICD-10-CM | POA: Diagnosis not present

## 2023-07-27 ENCOUNTER — Ambulatory Visit (HOSPITAL_COMMUNITY): Payer: Medicare Other

## 2023-07-27 ENCOUNTER — Encounter (HOSPITAL_COMMUNITY): Payer: Self-pay

## 2023-07-27 ENCOUNTER — Ambulatory Visit (HOSPITAL_COMMUNITY)
Admission: RE | Admit: 2023-07-27 | Discharge: 2023-07-27 | Disposition: A | Discharge status: Home / Self Care | Payer: Medicare Other | Source: Ambulatory Visit | Attending: Primary Care | Admitting: Primary Care

## 2023-07-27 DIAGNOSIS — R911 Solitary pulmonary nodule: Secondary | ICD-10-CM | POA: Insufficient documentation

## 2023-07-27 DIAGNOSIS — I700 Atherosclerosis of aorta: Secondary | ICD-10-CM | POA: Diagnosis not present

## 2023-07-27 DIAGNOSIS — Z9189 Other specified personal risk factors, not elsewhere classified: Secondary | ICD-10-CM | POA: Insufficient documentation

## 2023-07-27 DIAGNOSIS — R918 Other nonspecific abnormal finding of lung field: Secondary | ICD-10-CM | POA: Diagnosis not present

## 2023-07-27 DIAGNOSIS — I3139 Other pericardial effusion (noninflammatory): Secondary | ICD-10-CM | POA: Diagnosis not present

## 2023-07-28 ENCOUNTER — Telehealth: Payer: Self-pay | Admitting: Internal Medicine

## 2023-07-28 NOTE — Telephone Encounter (Signed)
Spoke with pt and she states that anytime she eats something she has to go to the bathroom. Report she is not having diarrhea. She is taking some imodium and her viberzi but reports it takes her an hour to have a BM and then it take 1/2 hour for any medication she might take to help calm her stomach down. Reports she has a lot of pain with BM's. She is taking bentyl regularly. Also reports she has had some wt loss.Suggested pt stick to bland foods and that she might be able to try IB Delene Ruffini OTC to see if that helps prior to her appt on 8/15.

## 2023-07-28 NOTE — Telephone Encounter (Signed)
Patient called requesting to speak with a nurse states she gets rectal pain, spasms and can not eat.

## 2023-08-04 ENCOUNTER — Encounter: Payer: Self-pay | Admitting: Gastroenterology

## 2023-08-04 ENCOUNTER — Ambulatory Visit (INDEPENDENT_AMBULATORY_CARE_PROVIDER_SITE_OTHER): Payer: Medicare Other | Admitting: Gastroenterology

## 2023-08-04 VITALS — BP 110/80 | HR 76 | Ht <= 58 in | Wt 126.0 lb

## 2023-08-04 DIAGNOSIS — K6289 Other specified diseases of anus and rectum: Secondary | ICD-10-CM | POA: Diagnosis not present

## 2023-08-04 DIAGNOSIS — K580 Irritable bowel syndrome with diarrhea: Secondary | ICD-10-CM | POA: Diagnosis not present

## 2023-08-04 DIAGNOSIS — R195 Other fecal abnormalities: Secondary | ICD-10-CM | POA: Diagnosis not present

## 2023-08-04 MED ORDER — HYOSCYAMINE SULFATE 0.125 MG SL SUBL: 0.1250 mg | SUBLINGUAL_TABLET | Freq: Four times a day (QID) | SUBLINGUAL | 2 refills | Status: DC | PRN

## 2023-08-04 MED ORDER — NA SULFATE-K SULFATE-MG SULF 17.5-3.13-1.6 GM/177ML PO SOLN: 1.0000 | Freq: Once | ORAL | 0 refills | Status: AC

## 2023-08-04 NOTE — Progress Notes (Signed)
Nichole Delgado can you make sure someone calls her with results, Dr. Vassie Loll sent to triage. Stable nodules, needs repeat CT chest in 6 months and follow-up visit in office in 3-4 months

## 2023-08-04 NOTE — Progress Notes (Signed)
08/04/2023 Nichole Delgado [id number] Jun 28, 1951   HISTORY OF PRESENT ILLNESS:  This is a 72 year old female with a past medical history of IBS-D, GERD, hypertension, DDD with complex regional pain syndrome with a spinal cord stimulator, history of anxiety who is a patient of Dr. Lauro Franklin.  Previously received GI care from Dr. Ewing Schlein.  She switched her care because she had a difficult time with communication with [facility] and getting her prescriptions on time.  She has been on Viberzi to treat her IBS-D for the past 5 to 6 years.  She tells me that she has had issues with IBS-D since in her 30s and issues with chronic rectal pain since in her 47s.  She gets crampy abdominal pain and has pain in her rectum that she says she was told was from spasm.  Dicyclomine does not help really with the rectal pain, but she does use it for the other cramping although she says it makes her feel weird at times.  She takes ibuprofen for the rectal pain as she says that is the only thing that helps with the rectal pain.  She tells me that her symptoms are very distressing and she has had to cancel plans and appointments because of her symptoms.  Last colonoscopy was in September 2015 with adequate prep and only a small hyperplastic subcentimeter sigmoid polyp removed.   Past Medical History:  Diagnosis Date   Aortic atherosclerosis (HCC)    Asthma    pulmonologist--- dr Vassie Loll  (12-27-2022  per pt last has been a while since last exacerbation)   Cervical spondylosis    Complex regional pain syndrome I    followed by pain clinic, pcp,  has spinal cord stimulator   Coronary artery calcification seen on CAT scan    cardiologist--- dr Rennis Golden;   nuclear stress test in epic 08-07-2022 low risk noishcemia, nuclear ef 73%   DDD (degenerative disc disease), lumbar    GAD (generalized anxiety disorder)    GERD (gastroesophageal reflux disease)    History of basal cell carcinoma (BCC) excision    per pt moh's sx  on nose in 2020   History of chronic bronchitis    uses inhaler prn   Hypertension    followed by pcp and dr hilty   Irritable bowel syndrome with diarrhea    followed by dr Rhea Belton  (GI)   Lesion of bladder    urologist--- dr pace   Migraines    neurology-- [facility] in [place]   Mixed dyslipidemia    followed by lipid clinic--- dr Rennis Golden   Neuropathy    feet   OA (osteoarthritis)    PONV (postoperative nausea and vomiting)    severe   Pre-diabetes    S/P insertion of spinal 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   SUI (stress urinary incontinence, female)    Thyroid cyst    04-24-2021  pt stated recently dx and has been referred to endocrinologist   Vertigo    Wears glasses    Past Surgical History:  Procedure Laterality Date   ANKLE HARDWARE REMOVAL Right 08/25/2022   CATARACT EXTRACTION W/ INTRAOCULAR LENS  IMPLANT, BILATERAL  2005   COLONOSCOPY W/ POLYPECTOMY     CYSTOSCOPY N/A 12/28/2022   Procedure: CYSTOSCOPY;  Surgeon: Noel Christmas, MD;  Location: [facility];  Service: Urology;  Laterality: N/A;   CYSTOSCOPY WITH BIOPSY N/A 05/12/2021   Procedure: CYSTOSCOPY WITH BIOPSY AND FULGERATION;  Surgeon: Noel Christmas,  MD;  Location: [facility];  Service: Urology;  Laterality: N/A;  30 MINS   FOOT SURGERY Right 2011   hammer toe   ORIF ANKLE FRACTURE Left 03/27/2017   Procedure: OPEN REDUCTION INTERNAL FIXATION (ORIF) BIMALLEOLAR ANKLE FRACTURE;  Surgeon: Sheral Apley, MD;  Location: [facility];  Service: Orthopedics;  Laterality: Left;   ORIF CALCANEOUS FRACTURE Right 04/05/2017   Procedure: OPEN REDUCTION INTERNAL FIXATION (ORIF) CALCANEOUS AND FIBULA FRACTURE;  Surgeon: Sheral Apley, MD;  Location: [facility] OR;  Service: Orthopedics;  Laterality: Right;   POSTERIOR LUMBAR FUSION  09-08-2020  @[facility]    L5--S1   SPINAL CORD STIMULATOR INSERTION  08/15/2018   SUPRACERVICAL ABDOMINAL HYSTERECTOMY  1988   Partial   TONSILLECTOMY AND  ADENOIDECTOMY  child   TRANSURETHRAL RESECTION OF BLADDER TUMOR N/A 12/28/2022   Procedure: TRANSURETHRAL RESECTION OF BLADDER TUMOR (TURBT);  Surgeon: Noel Christmas, MD;  Location: [facility];  Service: Urology;  Laterality: N/A;  45 MINS    reports that she has been smoking cigarettes. She started smoking about 50 years ago. She has a 50 pack-year smoking history. She has never been exposed to tobacco smoke. She has never used smokeless tobacco. She reports that she does not currently use alcohol. She reports that she does not use drugs. family history includes Breast cancer in her cousin; Crohn's disease in her nephew; Heart attack in her father; Hyperlipidemia in her daughter, father, and mother; Hypertension in her daughter, father, and mother; Liver cancer in her brother; Migraines in her daughter and mother; Pancreatic cancer in her sister; Ulcerative colitis in her sister; Vascular Disease in her daughter. Allergies  Allergen Reactions   Penicillins Hives    Has patient had a PCN reaction causing immediate rash, facial/tongue/throat swelling, SOB or lightheadedness with hypotension: No Has patient had a PCN reaction causing severe rash involving mucus membranes or skin necrosis: Yes Has patient had a PCN reaction that required hospitalization No Has patient had a PCN reaction occurring within the last 10 years: No If all of the above answers are "NO", then may proceed with Cephalosporin use.    Sulfa Antibiotics Hives and Rash   Budesonide-Formoterol Fumarate     Other reaction(s): covid symptoms   Fluticasone-Salmeterol     Other reaction(s): covid symptoms   Codeine Nausea Only   Hydrocodone Rash   Keppra [Levetiracetam] Rash   Other Other (See Comments)    States she has allergies to other seizure medications but doesn't know which ones   Topamax [Topiramate] Nausea Only    Weight loss      Outpatient Encounter Medications as of 08/04/2023  Medication Sig    albuterol (VENTOLIN HFA) 108 (90 Base) MCG/ACT inhaler Inhale 2 puffs into the lungs every 6 (six) hours as needed for wheezing or shortness of breath.   Ascorbic Acid (VITAMIN C) 500 MG CAPS Take 1 tablet by mouth daily.   Ca Phosphate-Cholecalciferol 250-500 MG-UNIT CHEW Chew 1 each by mouth daily.   Cholecalciferol (VITAMIN D3) 50 MCG (2000 UT) TABS Take 1 tablet by mouth daily.   dicyclomine (BENTYL) 10 MG capsule Take 1-2 capsules (10-20 mg total) by mouth 3 (three) times daily as needed for spasms. (Patient taking differently: Take 10-20 mg by mouth 3 (three) times daily as needed for spasms.)   Eluxadoline (VIBERZI) 75 MG TABS Take 1 tablet (75 mg total) by mouth 2 (two) times daily.   estradiol (CLIMARA - DOSED IN MG/24 HR) 0.1 mg/24hr  patch Place 0.1 mg onto the skin 2 (two) times a week.   fluticasone (FLONASE) 50 MCG/ACT nasal spray Place 1 spray into both nostrils daily.   fluticasone furoate-vilanterol (BREO ELLIPTA) 200-25 MCG/ACT AEPB Inhale 1 puff into the lungs daily.   ibuprofen (ADVIL,MOTRIN) 200 MG tablet Take 400 mg by mouth every 6 (six) hours as needed for mild pain.   loperamide (IMODIUM) 2 MG capsule Take 2 mg by mouth as needed.   losartan (COZAAR) 25 MG tablet Take 1 tablet (25 mg total) by mouth daily. (Patient taking differently: Take 25 mg by mouth at bedtime.)   meclizine (ANTIVERT) 25 MG tablet Take 25 mg by mouth 3 (three) times daily as needed for dizziness.   ondansetron (ZOFRAN) 8 MG tablet Take by mouth every 8 (eight) hours as needed for nausea or vomiting.   perphenazine (TRILAFON) 2 MG tablet Take 2 mg by mouth at bedtime.   pravastatin (PRAVACHOL) 40 MG tablet TAKE 1 TABLET BY MOUTH AT BEDTIME   traMADol (ULTRAM) 50 MG tablet Take by mouth every 6 (six) hours as needed.   zolpidem (AMBIEN) 10 MG tablet Take 5 mg by mouth at bedtime as needed for sleep.   [DISCONTINUED] fluticasone furoate-vilanterol (BREO ELLIPTA) 100-25 MCG/ACT AEPB Inhale 1 puff into the  lungs daily.   No facility-administered encounter medications on file as of 08/04/2023.     REVIEW OF SYSTEMS  : All other systems reviewed and negative except where noted in the History of Present Illness.   PHYSICAL EXAM: BP 110/80   Pulse 76   Ht 4\' 10"  (1.473 m)   Wt 126 lb (57.2 kg)   BMI 26.33 kg/m  General: Well developed white female in no acute distress Head: Normocephalic and atraumatic Eyes:  Sclerae anicteric, conjunctiva pink. Ears: Normal auditory acuity Lungs: Clear throughout to auscultation; no W/R/R. Heart: Regular rate and rhythm; no M/R/G. Abdomen: Soft, non-distended.  BS present.  Mild diffuse TTP. Rectal:  Will be done at the time of colonoscopy. Musculoskeletal: Symmetrical with no gross deformities  Skin: No lesions on visible extremities Extremities: No edema  Neurological: Alert oriented x 4, grossly non-focal Psychological:  Alert and cooperative. Normal mood and affect  ASSESSMENT AND PLAN: 72 year old female with past medical history of IBS-D, GERD, hypertension, DDD with complex regional pain syndrome with a spinal cord stimulator, history of anxiety.  *IBS-D/rectal pain, question of proctalgia fugax: She describes longstanding GI symptoms since in her 30s, regular rectal pain since in her 40s.  Symptoms persist despite Viberzi, loperamide, dicyclomine.  Last colonoscopy was September 2015 at [facility].  -Will plan for repeat colonoscopy with Dr. Rhea Belton to assess for any other changes, etc.  The risks, benefits, and alternatives to colonoscopy were discussed with the patient and she consents to proceed.  -Discontinue dicyclomine and try sublingual Levsin instead.  Prescription sent to pharmacy.   CC:  Ardith Dark, MD

## 2023-08-04 NOTE — Patient Instructions (Addendum)
We have sent the following medications to your pharmacy for you to pick up at your convenience: Levsin 0.125 mg SL tablet every 6-8 hours as needed.   Stop Bentyl.  You have been scheduled for a colonoscopy. Please follow written instructions given to you at your visit today.   Please pick up your prep supplies at the pharmacy within the next 1-3 days.  If you use inhalers (even only as needed), please bring them with you on the day of your procedure.  DO NOT TAKE 7 DAYS PRIOR TO TEST- Trulicity (dulaglutide) Ozempic, Wegovy (semaglutide) Mounjaro (tirzepatide) Bydureon Bcise (exanatide extended release)  DO NOT TAKE 1 DAY PRIOR TO YOUR TEST Rybelsus (semaglutide) Adlyxin (lixisenatide) Victoza (liraglutide) Byetta (exanatide) ___________________________________________________________________________  _______________________________________________________  If your blood pressure at your visit was 140/90 or greater, please contact your primary care physician to follow up on this.  _______________________________________________________  If you are age 26 or older, your body mass index should be between 23-30. Your Body mass index is 26.33 kg/m. If this is out of the aforementioned range listed, please consider follow up with your Primary Care Provider.  If you are age 32 or younger, your body mass index should be between 19-25. Your Body mass index is 26.33 kg/m. If this is out of the aformentioned range listed, please consider follow up with your Primary Care Provider.   ________________________________________________________  The [facility] providers would like to encourage you to use [facility] to communicate with providers for non-urgent requests or questions.  Due to long hold times on the telephone, sending your provider a message by [facility] may be a faster and more efficient way to get a response.  Please allow 48 business hours for a response.  Please remember that this  is for non-urgent requests.  _______________________________________________________

## 2023-08-05 NOTE — Progress Notes (Signed)
Addendum: Reviewed and agree with assessment and management plan. Pyrtle, Jay M, MD  

## 2023-08-09 ENCOUNTER — Ambulatory Visit (AMBULATORY_SURGERY_CENTER): Payer: Medicare Other | Admitting: Internal Medicine

## 2023-08-09 ENCOUNTER — Encounter: Payer: Self-pay | Admitting: Internal Medicine

## 2023-08-09 VITALS — BP 126/60 | HR 71 | Temp 98.7°F | Resp 10 | Ht <= 58 in | Wt 126.0 lb

## 2023-08-09 DIAGNOSIS — D125 Benign neoplasm of sigmoid colon: Secondary | ICD-10-CM | POA: Diagnosis not present

## 2023-08-09 DIAGNOSIS — D123 Benign neoplasm of transverse colon: Secondary | ICD-10-CM

## 2023-08-09 DIAGNOSIS — K529 Noninfective gastroenteritis and colitis, unspecified: Secondary | ICD-10-CM

## 2023-08-09 DIAGNOSIS — K580 Irritable bowel syndrome with diarrhea: Secondary | ICD-10-CM

## 2023-08-09 DIAGNOSIS — K6289 Other specified diseases of anus and rectum: Secondary | ICD-10-CM | POA: Diagnosis not present

## 2023-08-09 DIAGNOSIS — I10 Essential (primary) hypertension: Secondary | ICD-10-CM | POA: Diagnosis not present

## 2023-08-09 MED ORDER — VIBERZI 100 MG PO TABS: 100.0000 mg | ORAL_TABLET | Freq: Two times a day (BID) | ORAL | 5 refills | Status: DC

## 2023-08-09 MED ORDER — SODIUM CHLORIDE 0.9 % IV SOLN: 500.0000 mL | Freq: Once | INTRAVENOUS | Status: DC

## 2023-08-09 NOTE — Op Note (Signed)
[facility] Patient Name: Nichole Delgado Procedure Date: 08/09/2023 2:33 PM MRN: [id number] Endoscopist: Beverley Fiedler , MD, [id number] Age: 72 Referring MD:  Date of Birth: [birth year] Gender: Female Account #: [id number] Procedure:                Colonoscopy Indications:              Pelvic pain, Chronic diarrhea Medicines:                Monitored Anesthesia Care Procedure:                Pre-Anesthesia Assessment:                           - Prior to the procedure, a History and Physical                            was performed, and patient medications and                            allergies were reviewed. The patient's tolerance of                            previous anesthesia was also reviewed. The risks                            and benefits of the procedure and the sedation                            options and risks were discussed with the patient.                            All questions were answered, and informed consent                            was obtained. Prior Anticoagulants: The patient has                            taken no anticoagulant or antiplatelet agents. ASA                            Grade Assessment: II - A patient with mild systemic                            disease. After reviewing the risks and benefits,                            the patient was deemed in satisfactory condition to                            undergo the procedure.                           After obtaining informed consent, the colonoscope  was passed under direct vision. Throughout the                            procedure, the patient's blood pressure, pulse, and                            oxygen saturations were monitored continuously. The                            Olympus PCF-H190DL (#[id number]) Colonoscope was                            introduced through the anus and advanced to the                            cecum, identified by the  appendiceal orifice, IC                            valve and transillumination. The colonoscopy was                            performed without difficulty. The patient tolerated                            the procedure well. The quality of the bowel                            preparation was good. The ileocecal valve,                            appendiceal orifice, and rectum were photographed. Scope In: 2:50:18 PM Scope Out: 3:07:15 PM Scope Withdrawal Time: 0 hours 14 minutes 14 seconds  Total Procedure Duration: 0 hours 16 minutes 57 seconds  Findings:                 The digital rectal exam was normal.                           The terminal ileum appeared normal.                           Two sessile polyps were found in the transverse                            colon. The polyps were 4 to 5 mm in size. These                            polyps were removed with a cold snare. Resection                            and retrieval were complete.                           A 6 mm polyp was found in the sigmoid colon. The  polyp was sessile. The polyp was removed with a                            cold snare. Resection and retrieval were complete.                           A few small-mouthed diverticula were found in the                            sigmoid colon.                           The retroflexed view of the distal rectum and anal                            verge was normal and showed no anal or rectal                            abnormalities. 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 Complications:            No immediate complications. Estimated Blood Loss:     Estimated blood loss was minimal. Impression:               - The examined portion of the ileum was normal.                           - Two 4 to 5 mm polyps in  the transverse colon,                            removed with a cold snare. Resected and retrieved.                           - One 6 mm polyp in the sigmoid colon, removed with                            a cold snare. Resected and retrieved.                           - Mild diverticulosis in the sigmoid colon.                           - The distal rectum and anal verge are normal on                            retroflexion view. Recommendation:           - Patient has a contact number available for  emergencies. The signs and symptoms of potential                            delayed complications were discussed with the                            patient. Return to normal activities tomorrow.                            Written discharge instructions were provided to the                            patient.                           - Resume previous diet.                           - Continue present medications.                           - Increase Viberzi to 100 mg BID.                           - Await pathology results.                           - No recommendation at this time regarding repeat                            colonoscopy due to age at next                            screening/surveillance interval. Beverley Fiedler, MD 08/09/2023 3:13:53 PM This report has been signed electronically.

## 2023-08-09 NOTE — Progress Notes (Signed)
MD wants Viberzi increased to 100mg  BID. Pt states that prescription needs to be sent to the manufacturer due to the cost at the pharmacy. Message sent to office nurse for assistance with this process.

## 2023-08-09 NOTE — Progress Notes (Signed)
Called to room to assist during endoscopic procedure.  Patient ID and intended procedure confirmed with present staff. Received instructions for my participation in the procedure from the performing physician.  

## 2023-08-09 NOTE — Progress Notes (Signed)
See office visit dated 08/04/2023 for details and current H&P  Patient coming for colonoscopy to evaluate rectal pain and irritable bowel with diarrhea predominance  She remains appropriate for [facility] colonoscopy today

## 2023-08-09 NOTE — Progress Notes (Signed)
Sedate, gd SR, tolerated procedure well, VSS, report to RN 

## 2023-08-09 NOTE — Patient Instructions (Signed)
Handout provided on polyps and diverticulosis.  Resume previous diet.  Continue present medications.  Increase Viberzi to 100mg  BID.  Await pathology results.  No recommendation at this time regarding repeat colonoscopy due to age at next screening/surveillance interval.   YOU HAD AN ENDOSCOPIC PROCEDURE TODAY AT THE [facility]:   Refer to the procedure report that was given to you for any specific questions about what was found during the examination.  If the procedure report does not answer your questions, please call your gastroenterologist to clarify.  If you requested that your care partner not be given the details of your procedure findings, then the procedure report has been included in a sealed envelope for you to review at your convenience later.  YOU SHOULD EXPECT: Some feelings of bloating in the abdomen. Passage of more gas than usual.  Walking can help get rid of the air that was put into your GI tract during the procedure and reduce the bloating. If you had a lower endoscopy (such as a colonoscopy or flexible sigmoidoscopy) you may notice spotting of blood in your stool or on the toilet paper. If you underwent a bowel prep for your procedure, you may not have a normal bowel movement for a few days.  Please Note:  You might notice some irritation and congestion in your nose or some drainage.  This is from the oxygen used during your procedure.  There is no need for concern and it should clear up in a day or so.  SYMPTOMS TO REPORT IMMEDIATELY:  Following lower endoscopy (colonoscopy or flexible sigmoidoscopy):  Excessive amounts of blood in the stool  Significant tenderness or worsening of abdominal pains  Swelling of the abdomen that is new, acute  Fever of 100F or higher  For urgent or emergent issues, a gastroenterologist can be reached at any hour by calling [phone]. Do not use MyChart messaging for urgent concerns.    DIET:  We do recommend a small  meal at first, but then you may proceed to your regular diet.  Drink plenty of fluids but you should avoid alcoholic beverages for 24 hours.  ACTIVITY:  You should plan to take it easy for the rest of today and you should NOT DRIVE or use heavy machinery until tomorrow (because of the sedation medicines used during the test).    FOLLOW UP: Our staff will call the number listed on your records the next business day following your procedure.  We will call around 7:15- 8:00 am to check on you and address any questions or concerns that you may have regarding the information given to you following your procedure. If we do not reach you, we will leave a message.     If any biopsies were taken you will be contacted by phone or by letter within the next 1-3 weeks.  Please call us at [phone] if you have not heard about the biopsies in 3 weeks.    SIGNATURES/CONFIDENTIALITY: You and/or your care partner have signed paperwork which will be entered into your electronic medical record.  These signatures attest to the fact that that the information above on your After Visit Summary has been reviewed and is understood.  Full responsibility of the confidentiality of this discharge information lies with you and/or your care-partner.

## 2023-08-10 ENCOUNTER — Telehealth: Payer: Self-pay

## 2023-08-10 NOTE — Telephone Encounter (Signed)
Called [facility] patient assistance [phone]) and spoke with pharmacist. Informed her that I need to change a patient's prescription to a increased dose. Pharmacist states they have a third party they contract with called [employer] that handles all the prescription changes. Pharmacist gave me [phone]) to call and give verbal order change. Spoke with Judeth Cornfield (pharmacist at [facility]) and gave verbal order for increased dose of Viberzi 100 mg twice daily, #60, with 5 refills. Judeth Cornfield verbalized understanding states she will have prescription shipped after change is processed.  Called patient and left a detailed voicemail explaining that the new dose change for Viberzi will be shipped to her but to continue current dose until she receives that in the mail.

## 2023-08-10 NOTE — Telephone Encounter (Signed)
-----   Message from Nurse Kerrie Buffalo sent at 08/09/2023  4:15 PM EDT ----- Regarding: FW: VIBERZI PRESCRIPTION Pinchus Weckwerth I saw in the chart you have worked with this drug for the pt recently. Please see note below. ----- Message ----- From: Mady Haagensen, RN Sent: 08/09/2023   3:46 PM EDT To: Chrystie Nose, RN Subject: Lattie Haw can you please help with this med order from Dr. Rhea Belton. He want's this pt to increase Viberzi to 100mg  BID, #60, refill 5. Pt states the prescription needs to be faxed to the manufacturer due to cost at the pharmacy. Thank you for your assistance.   Jess

## 2023-08-10 NOTE — Telephone Encounter (Signed)
  Follow up Call-     08/09/2023    2:03 PM 02/14/2023    9:01 AM  Call back number  Post procedure Call Back phone  # [phone]  Permission to leave phone message Yes    Follow up call, LVM

## 2023-08-15 ENCOUNTER — Encounter: Payer: Self-pay | Admitting: Internal Medicine

## 2023-08-17 DIAGNOSIS — M79671 Pain in right foot: Secondary | ICD-10-CM | POA: Diagnosis not present

## 2023-08-17 DIAGNOSIS — M542 Cervicalgia: Secondary | ICD-10-CM | POA: Diagnosis not present

## 2023-08-17 DIAGNOSIS — G9050 Complex regional pain syndrome I, unspecified: Secondary | ICD-10-CM | POA: Diagnosis not present

## 2023-08-17 DIAGNOSIS — Z79891 Long term (current) use of opiate analgesic: Secondary | ICD-10-CM | POA: Diagnosis not present

## 2023-08-17 DIAGNOSIS — G629 Polyneuropathy, unspecified: Secondary | ICD-10-CM | POA: Diagnosis not present

## 2023-08-17 DIAGNOSIS — G8929 Other chronic pain: Secondary | ICD-10-CM | POA: Diagnosis not present

## 2023-08-23 ENCOUNTER — Telehealth: Payer: Self-pay | Admitting: Internal Medicine

## 2023-08-23 DIAGNOSIS — R195 Other fecal abnormalities: Secondary | ICD-10-CM

## 2023-08-23 DIAGNOSIS — K529 Noninfective gastroenteritis and colitis, unspecified: Secondary | ICD-10-CM

## 2023-08-23 NOTE — Telephone Encounter (Signed)
Inbound call from patient stating that she had a colonoscopy 8/20 and is still having diarrhea and is very weak. Patient is requesting a call back to discuss. Please advise.

## 2023-08-23 NOTE — Telephone Encounter (Signed)
Pt states she is still having diarrhea, the viberzi is not working. States in the am if she drinks anything she has diarrhea, if she eats anything she has diarrhea. Reports she is very weak and laying in the bed. Discussed with her if she is not drinking fluids she may need to get IV fluids in the ER or at an urgent care. Dr. Rhea Belton please advise.

## 2023-08-23 NOTE — Telephone Encounter (Signed)
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

## 2023-08-24 NOTE — Telephone Encounter (Signed)
Called and left patient a detailed vm on pt's cell with Dr. Lauro Franklin recommendations. I advised pt to pick up the stool kits from our basement lab at her earliest convenience. I provided pt with lab hours. Pt has been advised to take Imodium OTC as directed on the box up to 8 tablets a day. I advised pt to call back with any questions or concerns.   Stool study orders in epic.

## 2023-09-01 ENCOUNTER — Other Ambulatory Visit: Payer: Medicare Other

## 2023-09-07 ENCOUNTER — Other Ambulatory Visit: Payer: Medicare Other

## 2023-09-07 DIAGNOSIS — R195 Other fecal abnormalities: Secondary | ICD-10-CM

## 2023-09-07 DIAGNOSIS — M25561 Pain in right knee: Secondary | ICD-10-CM | POA: Diagnosis not present

## 2023-09-07 DIAGNOSIS — K529 Noninfective gastroenteritis and colitis, unspecified: Secondary | ICD-10-CM

## 2023-09-07 DIAGNOSIS — M25562 Pain in left knee: Secondary | ICD-10-CM | POA: Diagnosis not present

## 2023-09-09 LAB — CLOSTRIDIUM DIFFICILE BY PCR: Toxigenic C. Difficile by PCR: NEGATIVE

## 2023-09-12 ENCOUNTER — Other Ambulatory Visit: Payer: Self-pay

## 2023-09-12 MED ORDER — DIPHENOXYLATE-ATROPINE 2.5-0.025 MG PO TABS: 1.0000 | ORAL_TABLET | Freq: Four times a day (QID) | ORAL | 1 refills | Status: DC | PRN

## 2023-09-12 NOTE — Telephone Encounter (Signed)
Nichole Delgado it looks like you have been working on this.

## 2023-09-12 NOTE — Telephone Encounter (Signed)
Inbound call from patient, states she spoke to the manufacturer and they advised they had not received any correspondence or information from [place] to increase the dose.

## 2023-09-12 NOTE — Telephone Encounter (Signed)
Inbound call from my [facility] assist advising they submitted a request to [facility] in regards to patient needing viberzi.

## 2023-09-12 NOTE — Telephone Encounter (Signed)
Called and spoke with [employer] regarding the Viberzi prescription. According to [employer] they do take the order for the increase in the prescription but do not contact [employer] patient assistance and set up delivery.   Called [employer] patient assistance and spoke with Judeth Cornfield and informed her that no one informed me that the patient or I had to contact them to set up delivery once I called the prescription into [facility]. Judeth Cornfield apologized and also states she did not have the increase dosage order. Gave verbal order to [facility] for Viberzi 100 mg twice daily. She gave the patient #180 with 1 refill. Stephanie set up delivery date for 09/14/23.  Called patient and explained the dilemma with the prescription and it should be delivered on 09/14/23. Patient verbalized understanding.

## 2023-09-14 ENCOUNTER — Other Ambulatory Visit: Payer: Self-pay | Admitting: Internal Medicine

## 2023-09-15 LAB — OVA AND PARASITE EXAMINATION
CONCENTRATE RESULT:: NONE SEEN
MICRO NUMBER:: 15483710
SPECIMEN QUALITY:: ADEQUATE
TRICHROME RESULT:: NONE SEEN

## 2023-09-16 ENCOUNTER — Encounter: Payer: Medicare Other | Attending: Physical Medicine & Rehabilitation | Admitting: Physical Medicine & Rehabilitation

## 2023-09-16 ENCOUNTER — Other Ambulatory Visit: Payer: Self-pay

## 2023-09-16 ENCOUNTER — Encounter: Payer: Self-pay | Admitting: Physical Medicine & Rehabilitation

## 2023-09-16 VITALS — BP 149/90 | HR 66 | Ht <= 58 in | Wt 127.0 lb

## 2023-09-16 DIAGNOSIS — M79671 Pain in right foot: Secondary | ICD-10-CM | POA: Insufficient documentation

## 2023-09-16 DIAGNOSIS — M79672 Pain in left foot: Secondary | ICD-10-CM | POA: Diagnosis not present

## 2023-09-16 MED ORDER — COLESTIPOL HCL 1 G PO TABS: ORAL_TABLET | ORAL | 3 refills | Status: DC

## 2023-09-16 NOTE — Progress Notes (Signed)
From Dr. Natale Lay note: "Nichole Delgado is a 72 y.o. 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   They are presenting to PM&R clinic as a new patient for pain management evaluation. They were referred by Dr. Jimmey Ralph for treatment of chronic leg pain.  Nichole Delgado reports her pain began around 2018 when she fell through the attic floor breaking her ankles.  Patient had displaced comminuted fractures right fibular and posterior calcaneus and displaced fracture of the left medial malleolus and displaced fracture of the distal fibula.  On 03/27/2017 she had ORIF bilateral bimalleolar ankle fractures by Dr. Eulah Pont.  She completed treatment at [facility] after this surgery.  Since this time she is had pain in her feet that is stabbing, numb, tingling, aching in quality.  Pain is present all the time.  She is concerned because now pain is moving higher up into her ankles.  She later had surgery to remove some of the surgical hardware from her right ankle and she feels like this helped her range of motion in her ankle.  She still has surgical hardware in her left ankle.  She previously was seen by Dr. Annie Sable neurosurgery.  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  She reports this device helped in the past but  is no longer providing benefit.  She also has a history of chronic lower back pain and had a posterior lumbar fusion L5-S1 completed by Dr. Noel Gerold. She reports she is generally an active person.   Chart review indicates she had a negative EMG and nerve conduction studies of bilateral lower extremities in 2019 by neurology"  Pt referred for BLE EMG/NCV Please refer to scanned EMG report in media section   Pt has R>L LE symptoms RLE performed today essentially normal study Pt c/o discomfort with exam and LLE exam was deferred

## 2023-09-22 ENCOUNTER — Encounter: Payer: Medicare Other | Attending: Physical Medicine & Rehabilitation | Admitting: Physical Medicine & Rehabilitation

## 2023-09-22 ENCOUNTER — Encounter: Payer: Self-pay | Admitting: Physical Medicine & Rehabilitation

## 2023-09-22 VITALS — BP 134/85 | HR 68 | Ht 59.0 in | Wt 124.6 lb

## 2023-09-22 DIAGNOSIS — R202 Paresthesia of skin: Secondary | ICD-10-CM | POA: Insufficient documentation

## 2023-09-22 DIAGNOSIS — M5441 Lumbago with sciatica, right side: Secondary | ICD-10-CM | POA: Insufficient documentation

## 2023-09-22 DIAGNOSIS — G8929 Other chronic pain: Secondary | ICD-10-CM | POA: Insufficient documentation

## 2023-09-22 DIAGNOSIS — M79672 Pain in left foot: Secondary | ICD-10-CM | POA: Insufficient documentation

## 2023-09-22 DIAGNOSIS — M5442 Lumbago with sciatica, left side: Secondary | ICD-10-CM | POA: Diagnosis not present

## 2023-09-22 DIAGNOSIS — G90523 Complex regional pain syndrome I of lower limb, bilateral: Secondary | ICD-10-CM | POA: Insufficient documentation

## 2023-09-22 DIAGNOSIS — M79671 Pain in right foot: Secondary | ICD-10-CM | POA: Diagnosis not present

## 2023-09-22 MED ORDER — PRAZOSIN HCL 1 MG PO CAPS: 1.0000 mg | ORAL_CAPSULE | Freq: Two times a day (BID) | ORAL | 4 refills | Status: AC

## 2023-09-22 NOTE — Progress Notes (Signed)
Subjective:    Patient ID: Nichole Delgado, female    DOB: Sep 30, 1951, 72 y.o.   MRN: [id number]  HPI   HPI   Nichole Delgado is a 72 y.o. 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   They are presenting to PM&R clinic as a new patient for pain management evaluation. They were referred by Dr. Jimmey Ralph for treatment of chronic leg pain.  Nichole Delgado reports her pain began around 2018 when she fell through the attic floor breaking her ankles.  Patient had displaced comminuted fractures right fibular and posterior calcaneus and displaced fracture of the left medial malleolus and displaced fracture of the distal fibula.  On 03/27/2017 she had ORIF bilateral bimalleolar ankle fractures by Dr. Eulah Pont.  She completed treatment at [facility] after this surgery.  Since this time she is had pain in her feet that is stabbing, numb, tingling, aching in quality.  Pain is present all the time.  She is concerned because now pain is moving higher up into her ankles.  She later had surgery to remove some of the surgical hardware from her right ankle and she feels like this helped her range of motion in her ankle.  She still has surgical hardware in her left ankle.  She previously was seen by Dr. Annie Sable neurosurgery.  She reports she was diagnosed with  CPRS and had a spinal cord stimulator placed.  She reports this device helped in the past but is no longer providing benefit.  She also has a history of chronic lower back pain and had a posterior lumbar fusion L5-S1 completed by Dr. Noel Gerold. She reports she is generally an active person.   Chart review indicates she had a negative EMG and nerve conduction studies of bilateral lower extremities in 2019 by neurology   She is currently getting tramadol 50 mg from different pain provider would like to continue following with this provider for her tramadol.   Red flag symptoms: No red flags for back pain endorsed in Hx or ROS   Medications tried: Topical medications - denies  Nsaids - helps with IBS with diarrhea  Tylenol  doesn't help Opiates- Since 2019 tramadol helps a little  Lyrica didn't help Topamax nausea Gapentin- drowsy 2 other seizure medicines caused rash TCAs  - nortriptyline- caused caused confusion , cognitive slowing  SNRIs - She doesn't want to try antidepressant medications     Other treatments: PT/OT  - last year after ankle surgery with mild benefit TENs unit- Denies use of this previously  Injections - She had cortisone into her ankles  Spinal cord stimulator- doesn't help much  Sympathic block ? Unsure about     09/22/2023 interval history Nichole Delgado is here for follow-up regarding her chronic leg pain.  She has a history of complex regional pain syndrome.  Pain is largely unchanged since last visit.  She reports when she uses her spinal cord stimulator this will increase the pain in her ankles.  She  is considering getting this removed.  She reports continued numbness and tingling in her calves.  EMG completed by Dr. Wynn Banker that was essentially normal.  She continues to follow with a different pain clinic that provides her with tramadol medication.  She uses about half a pill at a time because higher doses will often cause nausea.  She wants to continue seeing this  pain clinic for her medication at this time.  She plans to move to a different state due to family reasons, she expects this will happen 6 to 12 months possibly.   Pain Inventory Average Pain 7 Pain Right Now 6 My pain is sharp and dull  In the last 24 hours, has pain interfered with the following? General activity 4 Relation with others 0 Enjoyment of life 4 What TIME of day is your pain at its worst? night Sleep (in general) Good  Pain is worse with: walking, bending, standing, and some activites Pain improves with: heat/ice and medication Relief from Meds: 5  Family History  Problem Relation Age of Onset   Migraines Mother    Hypertension Mother    Hyperlipidemia Mother    Heart attack Father    Hypertension Father    Hyperlipidemia Father    Pancreatic cancer Sister    Ulcerative colitis Sister    Liver cancer Brother    Migraines Daughter    Vascular Disease Daughter    Hypertension Daughter    Hyperlipidemia Daughter    Breast cancer Cousin    Crohn's disease Nephew    Stomach cancer Neg Hx    Rectal cancer Neg Hx    Esophageal cancer Neg Hx    Colon cancer Neg Hx    Social History   Socioeconomic History   Marital status: Married    Spouse name: Not on file   Number of children: 1   Years of education: 12   Highest education level: Not on file  Occupational History   Occupation: retired  Tobacco Use   Smoking status: Every Day    Current packs/day: 0.00    Average packs/day: 1 pack/day for 50.0 years (50.0 ttl pk-yrs)    Types: Cigarettes    Start date: 08/24/1972    Last attempt to quit: 08/24/2022    Years since quitting: 1.0    Passive exposure: Never   Smokeless tobacco: Never   Tobacco comments:    Smoking 1 ppd 05/06/23 BR  Vaping Use   Vaping status: Never Used  Substance and Sexual Activity   Alcohol use: Not Currently    Alcohol/week: 0.0 standard drinks of alcohol   Drug use: Never   Sexual activity: Not on file  Other Topics Concern    Not on file  Social History Narrative   Lives at home with her husband   Right-handed   Drinks about 5 cups of coffee per day   Social Determinants of Health   Financial Resource Strain: Low Risk  (08/09/2022)   Overall Financial Resource Strain (CARDIA)    Difficulty of Paying Living Expenses: Not hard at all  Food Insecurity: No Food Insecurity (08/09/2022)   Hunger Vital Sign    Worried About Running Out of Food in the Last Year: Never true    Ran Out of Food in the Last Year: Never true  Transportation Needs: No Transportation Needs (08/09/2022)   PRAPARE - Administrator, Civil Service (Medical): No    Lack of Transportation (Non-Medical): No  Physical Activity: Unknown (08/09/2022)  Exercise Vital Sign    Days of Exercise per Week: 0 days    Minutes of Exercise per Session: Not on file  Stress: No Stress Concern Present (08/09/2022)   [employer] of Occupational Health - Occupational Stress Questionnaire    Feeling of Stress : Not at all  Social Connections: Moderately Isolated (08/09/2022)   Social Connection and Isolation Panel [NHANES]    Frequency of Communication with Friends and Family: More than three times a week    Frequency of Social Gatherings with Friends and Family: More than three times a week    Attends Religious Services: Never    Database administrator or Organizations: No    Attends Banker Meetings: Never    Marital Status: Married   Past Surgical History:  Procedure Laterality Date   ANKLE HARDWARE REMOVAL Right 08/25/2022   CATARACT EXTRACTION W/ INTRAOCULAR LENS  IMPLANT, BILATERAL  2005   COLONOSCOPY W/ POLYPECTOMY     CYSTOSCOPY N/A 12/28/2022   Procedure: CYSTOSCOPY;  Surgeon: Noel Christmas, MD;  Location: [facility];  Service: Urology;  Laterality: N/A;   CYSTOSCOPY WITH BIOPSY N/A 05/12/2021   Procedure: CYSTOSCOPY WITH BIOPSY AND FULGERATION;  Surgeon: Noel Christmas, MD;  Location: [facility]  [place];  Service: Urology;  Laterality: N/A;  30 MINS   FOOT SURGERY Right 2011   hammer toe   ORIF ANKLE FRACTURE Left 03/27/2017   Procedure: OPEN REDUCTION INTERNAL FIXATION (ORIF) BIMALLEOLAR ANKLE FRACTURE;  Surgeon: Sheral Apley, MD;  Location: [facility];  Service: Orthopedics;  Laterality: Left;   ORIF CALCANEOUS FRACTURE Right 04/05/2017   Procedure: OPEN REDUCTION INTERNAL FIXATION (ORIF) CALCANEOUS AND FIBULA FRACTURE;  Surgeon: Sheral Apley, MD;  Location: [facility];  Service: Orthopedics;  Laterality: Right;   POSTERIOR LUMBAR FUSION  09-08-2020  @[facility]    L5--S1   SPINAL CORD STIMULATOR INSERTION  08/15/2018   SUPRACERVICAL ABDOMINAL HYSTERECTOMY  1988   Partial   TONSILLECTOMY AND ADENOIDECTOMY  child   TRANSURETHRAL RESECTION OF BLADDER TUMOR N/A 12/28/2022   Procedure: TRANSURETHRAL RESECTION OF BLADDER TUMOR (TURBT);  Surgeon: Noel Christmas, MD;  Location: [facility];  Service: Urology;  Laterality: N/A;  45 MINS   Past Surgical History:  Procedure Laterality Date   ANKLE HARDWARE REMOVAL Right 08/25/2022   CATARACT EXTRACTION W/ INTRAOCULAR LENS  IMPLANT, BILATERAL  2005   COLONOSCOPY W/ POLYPECTOMY     CYSTOSCOPY N/A 12/28/2022   Procedure: CYSTOSCOPY;  Surgeon: Noel Christmas, MD;  Location: [facility];  Service: Urology;  Laterality: N/A;   CYSTOSCOPY WITH BIOPSY N/A 05/12/2021   Procedure: CYSTOSCOPY WITH BIOPSY AND FULGERATION;  Surgeon: Noel Christmas, MD;  Location: [facility] ;  Service: Urology;  Laterality: N/A;  30 MINS   FOOT SURGERY Right 2011   hammer toe   ORIF ANKLE FRACTURE Left 03/27/2017   Procedure: OPEN REDUCTION INTERNAL FIXATION (ORIF) BIMALLEOLAR ANKLE FRACTURE;  Surgeon: Sheral Apley, MD;  Location: [facility];  Service: Orthopedics;  Laterality: Left;   ORIF CALCANEOUS FRACTURE Right 04/05/2017   Procedure: OPEN REDUCTION INTERNAL FIXATION (ORIF) CALCANEOUS AND FIBULA FRACTURE;   Surgeon: Sheral Apley, MD;  Location: [facility];  Service: Orthopedics;  Laterality: Right;   POSTERIOR LUMBAR FUSION  09-08-2020  @[facility]    L5--S1   SPINAL CORD STIMULATOR INSERTION  08/15/2018   SUPRACERVICAL ABDOMINAL HYSTERECTOMY  1988   Partial   TONSILLECTOMY AND ADENOIDECTOMY  child  TRANSURETHRAL RESECTION OF BLADDER TUMOR N/A 12/28/2022   Procedure: TRANSURETHRAL RESECTION OF BLADDER TUMOR (TURBT);  Surgeon: Noel Christmas, MD;  Location: [facility];  Service: Urology;  Laterality: N/A;  45 MINS   Past Medical History:  Diagnosis Date   Aortic atherosclerosis (HCC)    Asthma    pulmonologist--- dr Vassie Loll  (12-27-2022  per pt last has been a while since last exacerbation)   Cervical spondylosis    Complex regional pain syndrome I    followed by pain clinic, pcp,  has spinal cord stimulator   Coronary artery calcification seen on CAT scan    cardiologist--- dr Rennis Golden;   nuclear stress test in epic 08-07-2022 low risk noishcemia, nuclear ef 73%   DDD (degenerative disc disease), lumbar    GAD (generalized anxiety disorder)    GERD (gastroesophageal reflux disease)    History of basal cell carcinoma (BCC) excision    per pt moh's sx on nose in 2020   History of chronic bronchitis    uses inhaler prn   Hypertension    followed by pcp and dr hilty   Irritable bowel syndrome with diarrhea    followed by dr Rhea Belton  (GI)   Lesion of bladder    urologist--- dr pace   Migraines    neurology-- [facility] [place]   Mixed dyslipidemia    followed by lipid clinic--- dr Rennis Golden   Neuropathy    feet   OA (osteoarthritis)    PONV (postoperative nausea and vomiting)    severe   Pre-diabetes    S/P insertion of spinal 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   SUI (stress urinary incontinence, female)    Thyroid cyst    04-24-2021  pt stated recently dx and has been referred to endocrinologist   Vertigo    Wears glasses    BP 134/85   Pulse 68   Ht 4\' 11"   (1.499 m)   Wt 124 lb 9.6 oz (56.5 kg)   SpO2 96%   BMI 25.17 kg/m   Opioid Risk Score:   Fall Risk Score:  `1  Depression screen PHQ 2/9     09/22/2023    1:11 PM 07/21/2023    1:25 PM 06/07/2023   12:52 PM 09/23/2022   11:16 AM 08/09/2022   12:02 PM 11/02/2021    9:47 AM 10/22/2021    2:09 PM  Depression screen PHQ 2/9  Decreased Interest 0 1 0 0 0 0 0  Down, Depressed, Hopeless 0 1 0 0 0 0 0  PHQ - 2 Score 0 2 0 0 0 0 0  Altered sleeping  1       Tired, decreased energy  2       Change in appetite  2       Feeling bad or failure about yourself   0       Trouble concentrating  0       Moving slowly or fidgety/restless  0       Suicidal thoughts  0       PHQ-9 Score  7       Difficult doing work/chores  Somewhat difficult         Review of Systems  Constitutional: Negative.   HENT: Negative.    Eyes: Negative.   Respiratory: Negative.    Cardiovascular: Negative.   Gastrointestinal: Negative.   Endocrine: Negative.   Genitourinary: Negative.   Musculoskeletal:  Positive for arthralgias, back pain and myalgias.  Skin: Negative.   Allergic/Immunologic: Negative.   Neurological:  Positive for numbness.       Tingling  Hematological: Negative.   Psychiatric/Behavioral: Negative.    All other systems reviewed and are negative.      Objective:   Physical Exam     Gen: no distress, normal appearing HEENT: oral mucosa pink and moist, NCAT Cardio: Reg rate Chest: normal effort, normal rate of breathing Abd: soft, non-distended Ext: no edema Psych: pleasant, normal affect Skin: intact Neuro: Alert and awake, follows commands, cranial nerves II through XII grossly intact, normal speech and language Moving b/l UE to gravity and resistance RLE: HF 5/5, KE 5/5, KF 5/5, ADF 4/5, APF 4/5 LLE: HF 5/5, KE 5/5, HF, 5/5, ADF 4/5, APF 4/5 -Ankle motor testing pain limited Sensory exam normal for light touch and pain in bilateral upper extremities.  She appears to have  decreased sensation to light touch and cold at her ankles and below  Pain with PROM ankles L>R No limb ataxia or cerebellar signs. No abnormal tone appreciated.  Tenderness to palpation bilateral feet and ankles        Assessment & Plan:   Assessment 1) History of complex regional pain syndrome with persistent tingling in bilateral lower extremities -She currently has a spinal cord stimulator and tramadol from different pain provider.   -Patient is primarily concerned with worsening of her symptoms as she feels that she is having more ankle involvement than she did previously.  She has failed multiple other oral medications. -Does not sound like she has had sympathetic nerve blocks completed previous   2) Chronic lower back pain with radiculopathy status post posterior fusion lumbar spine   3) Altered sensation in bilateral feet   Plan -She declined Cymbalta again today -Will try prazosin 1mg  BID for CRPS -Asked patient to request release of information from [place] [facility] for her prior spinal imaging, notes recent MRI if available..  She reports she was told that numbness pain was not coming from her lower back. -EMG/NCS Dr. Wynn Banker 09/16/23 with essentially normal study -Continue home exercise program -Advised f/u with NSGY to check on placement spinal cord stimulator leads due to ankle pain -Consider referral to [facility] pain institute for possible consideration of different interventional pain options-she declines  -She would like to continue with f/u with her current provider for tramadol for pain management -Of note she is planning to move to a different state in 89months-1year

## 2023-09-23 DIAGNOSIS — J441 Chronic obstructive pulmonary disease with (acute) exacerbation: Secondary | ICD-10-CM | POA: Diagnosis not present

## 2023-10-05 DIAGNOSIS — G9050 Complex regional pain syndrome I, unspecified: Secondary | ICD-10-CM | POA: Diagnosis not present

## 2023-10-05 DIAGNOSIS — Z79891 Long term (current) use of opiate analgesic: Secondary | ICD-10-CM | POA: Diagnosis not present

## 2023-10-05 DIAGNOSIS — G8929 Other chronic pain: Secondary | ICD-10-CM | POA: Diagnosis not present

## 2023-10-05 DIAGNOSIS — M79671 Pain in right foot: Secondary | ICD-10-CM | POA: Diagnosis not present

## 2023-10-12 ENCOUNTER — Encounter: Payer: Self-pay | Admitting: Physical Medicine & Rehabilitation

## 2023-10-13 ENCOUNTER — Other Ambulatory Visit: Payer: Self-pay | Admitting: Family Medicine

## 2023-10-19 ENCOUNTER — Telehealth: Payer: Self-pay | Admitting: Internal Medicine

## 2023-10-19 NOTE — Telephone Encounter (Signed)
Left message for patient that OK to see PCP for lipids since only on pravastatin OR she can r/s with Dr. Rennis Golden if she chooses.

## 2023-10-19 NOTE — Telephone Encounter (Signed)
Chrystie Nose, MD  You50 minutes ago (2:16 PM)    She can follow-up with her PCP if she wants - PCP can prescribe her statin.  Dr. Rexene Edison

## 2023-10-19 NOTE — Telephone Encounter (Signed)
Patient cancelled 10/31 Lipid follow-up with Dr. Rennis Golden due to a conflict. She would like to know if it will be necessary for her to reschedule. Will Dr. Rennis Golden need to continue following patient for lipid management or can this be done with PCP? Please advise.

## 2023-10-20 ENCOUNTER — Ambulatory Visit: Payer: Medicare Other | Admitting: Internal Medicine

## 2023-10-21 DIAGNOSIS — H903 Sensorineural hearing loss, bilateral: Secondary | ICD-10-CM | POA: Diagnosis not present

## 2023-10-21 DIAGNOSIS — M26609 Unspecified temporomandibular joint disorder, unspecified side: Secondary | ICD-10-CM | POA: Diagnosis not present

## 2023-10-23 DIAGNOSIS — Z23 Encounter for immunization: Secondary | ICD-10-CM | POA: Diagnosis not present

## 2023-10-27 DIAGNOSIS — M47896 Other spondylosis, lumbar region: Secondary | ICD-10-CM | POA: Diagnosis not present

## 2023-10-27 DIAGNOSIS — M4317 Spondylolisthesis, lumbosacral region: Secondary | ICD-10-CM | POA: Diagnosis not present

## 2023-11-02 DIAGNOSIS — Z79891 Long term (current) use of opiate analgesic: Secondary | ICD-10-CM | POA: Diagnosis not present

## 2023-11-02 DIAGNOSIS — G9050 Complex regional pain syndrome I, unspecified: Secondary | ICD-10-CM | POA: Diagnosis not present

## 2023-11-02 DIAGNOSIS — G8929 Other chronic pain: Secondary | ICD-10-CM | POA: Diagnosis not present

## 2023-11-02 DIAGNOSIS — M79671 Pain in right foot: Secondary | ICD-10-CM | POA: Diagnosis not present

## 2023-11-03 ENCOUNTER — Other Ambulatory Visit: Payer: Self-pay | Admitting: Orthopaedic Surgery

## 2023-11-03 DIAGNOSIS — M5416 Radiculopathy, lumbar region: Secondary | ICD-10-CM

## 2023-11-03 DIAGNOSIS — M47816 Spondylosis without myelopathy or radiculopathy, lumbar region: Secondary | ICD-10-CM

## 2023-11-07 ENCOUNTER — Ambulatory Visit (INDEPENDENT_AMBULATORY_CARE_PROVIDER_SITE_OTHER): Payer: Medicare Other | Admitting: Family Medicine

## 2023-11-07 ENCOUNTER — Encounter: Payer: Self-pay | Admitting: Family Medicine

## 2023-11-07 VITALS — BP 123/73 | HR 72 | Temp 97.1°F | Ht 59.0 in | Wt 126.2 lb

## 2023-11-07 DIAGNOSIS — I10 Essential (primary) hypertension: Secondary | ICD-10-CM | POA: Diagnosis not present

## 2023-11-07 DIAGNOSIS — E785 Hyperlipidemia, unspecified: Secondary | ICD-10-CM

## 2023-11-07 DIAGNOSIS — K580 Irritable bowel syndrome with diarrhea: Secondary | ICD-10-CM

## 2023-11-07 MED ORDER — LOSARTAN POTASSIUM 25 MG PO TABS: 25.0000 mg | ORAL_TABLET | Freq: Every day | ORAL | 3 refills | Status: DC

## 2023-11-07 NOTE — Assessment & Plan Note (Signed)
Continue per GI.

## 2023-11-07 NOTE — Assessment & Plan Note (Signed)
She is currently on pravastatin 40 mg daily.  She will come back soon for annual visit and we can recheck lipids at that time.

## 2023-11-07 NOTE — Progress Notes (Signed)
   GENOLA POW is a 72 y.o. female who presents today for an office visit.  Assessment/Plan:  Chronic Problems Addressed Today: Essential hypertension Blood pressure at goal on losartan 25 mg daily.  We will send this prescription in.  She will continue to monitor at home and let us know if persistently elevated.  IBS (irritable bowel syndrome) Continue per GI.  Dyslipidemia She is currently on pravastatin 40 mg daily.  She will come back soon for annual visit and we can recheck lipids at that time.     Subjective:  HPI:  See Assessment / plan for status of chronic conditions. She is here today for medication refills.  She needs a refill on her losartan. She has been stable on this for years. This was previously prescribed by her cardiologist but would like for [place] to take this prescription over.        Objective:  Physical Exam: BP 123/73   Pulse 72   Temp (!) 97.1 F (36.2 C) (Temporal)   Ht 4\' 11"  (1.499 m)   Wt 126 lb 3.2 oz (57.2 kg)   SpO2 96%   BMI 25.49 kg/m   Gen: No acute distress, resting comfortably CV: Regular rate and rhythm with no murmurs appreciated Pulm: Normal work of breathing, clear to auscultation bilaterally with no crackles, wheezes, or rhonchi Neuro: Grossly normal, moves all extremities Psych: Normal affect and thought content      Sallyanne Birkhead M. Jimmey Ralph, MD 11/07/2023 2:15 PM

## 2023-11-07 NOTE — Assessment & Plan Note (Signed)
Blood pressure at goal on losartan 25 mg daily.  We will send this prescription in.  She will continue to monitor at home and let us know if persistently elevated.

## 2023-11-07 NOTE — Patient Instructions (Signed)
It was very nice to see you today!  I will refill your meds.  Please, get a few months for annual physical.  Return in about 3 months (around 02/07/2024) for Annual Physical.   Take care, Dr Jimmey Ralph  PLEASE NOTE:  If you had any lab tests, please let us know if you have not heard back within a few days. You may see your results on mychart before we have a chance to review them but we will give you a call once they are reviewed by [place].   If we ordered any referrals today, please let us know if you have not heard from their office within the next week.   If you had any urgent prescriptions sent in today, please check with the pharmacy within an hour of our visit to make sure the prescription was transmitted appropriately.   Please try these tips to maintain a healthy lifestyle:  Eat at least 3 REAL meals and 1-2 snacks per day.  Aim for no more than 5 hours between eating.  If you eat breakfast, please do so within one hour of getting up.   Each meal should contain half fruits/vegetables, one quarter protein, and one quarter carbs (no bigger than a computer mouse)  Cut down on sweet beverages. This includes juice, soda, and sweet tea.   Drink at least 1 glass of water with each meal and aim for at least 8 glasses per day  Exercise at least 150 minutes every week.

## 2023-11-08 ENCOUNTER — Ambulatory Visit (HOSPITAL_BASED_OUTPATIENT_CLINIC_OR_DEPARTMENT_OTHER): Payer: Medicare Other | Admitting: Pulmonary Disease

## 2023-11-08 ENCOUNTER — Encounter (HOSPITAL_BASED_OUTPATIENT_CLINIC_OR_DEPARTMENT_OTHER): Payer: Self-pay | Admitting: Pulmonary Disease

## 2023-11-08 VITALS — BP 126/84 | HR 77 | Ht 59.0 in | Wt 127.3 lb

## 2023-11-08 DIAGNOSIS — R911 Solitary pulmonary nodule: Secondary | ICD-10-CM

## 2023-11-08 DIAGNOSIS — J4540 Moderate persistent asthma, uncomplicated: Secondary | ICD-10-CM

## 2023-11-08 DIAGNOSIS — F17200 Nicotine dependence, unspecified, uncomplicated: Secondary | ICD-10-CM | POA: Diagnosis not present

## 2023-11-08 NOTE — Assessment & Plan Note (Signed)
She will try vaping to try and quit

## 2023-11-08 NOTE — Patient Instructions (Addendum)
X CT chest wo con in may 2025  Refills on Breo as needed

## 2023-11-08 NOTE — Progress Notes (Signed)
   Subjective:    Patient ID: Nichole Delgado, female    DOB: 11-Jan-1951, 72 y.o.   MRN: [id number]  HPI  72  yo active smoker followed for DOE, pulmonary nodule and chronic bronchitis vs asthma   She smokes about a pack per day, more than 50 pack years Nodule - Not hypermetabolic  but growth in size from 10 to 17 mm from 2021-20 23.      PMH -complex regional pain syndrome after a fall and fractures of both ankles with right foot drop status post spinal cord stimulator, -IBS with diarrhea   Based on bronchodilator response on PFTs, she was started on Symbicort with a diagnosis of asthma. This caused her heart to race and she was switched to Advair. Advair caused soreness of her throat , now on Breo.  45-month follow-up visit She has had a bad year with several deaths in her family.  She is able to take Breo, occasionally has to take albuterol in the mornings if she has shortness of breath. She is continues to smoke about a pack per day.  She and her husband have decided to start vaping.  She is considering a moved to [place] with her daughter once they get settled down there. We reviewed CT chest from August She has chronic pain and tramadol makes her nauseous  Significant tests/ events reviewed  CT chest wo con 07/2023 >> Stable bilateral subsolid pulmonary nodules incl largest LUL CT chest without con 11/2022 persistent left upper lobe nodule 17 x 8 mm PET 08/2022 negative, mild hypermetabolism in vocal cord [facility] chest 07/2022 increased size of groundglass left upper lobe nodule to 14.6 mm with central solid component measuring 8 mm CT chest 04/2017-10 x 7 mm groundglass opacity in the left upper lobe CT chest 11/2017- stable 10 x 7 mm groundglass opacity in the left upper lobe.  Improvement in small nodular opacities on the right.  CT chest 06/2020 stable 10 x 7 mm left upper lobe nodule   PFTs  05/27/2022 : FVC 65, FEV1 65, ratio 75.  positive bronchodilator response (19%)    01/2018 FVC 2.10 [81%], FEV1 1.64 [84%], F/F 78, TLC 91%, RV/TLC 128%, DLCO 81% Minimal obstruction, small airways disease   FENO 01/2018-7  Review of Systems neg for any significant sore throat, dysphagia, itching, sneezing, nasal congestion or excess/ purulent secretions, fever, chills, sweats, unintended wt loss, pleuritic or exertional cp, hempoptysis, orthopnea pnd or change in chronic leg swelling. Also denies presyncope, palpitations, heartburn, abdominal pain, nausea, vomiting, diarrhea or change in bowel or urinary habits, dysuria,hematuria, rash, arthralgias, visual complaints, headache, numbness weakness or ataxia.     Objective:   Physical Exam  Gen. Pleasant, elderly well-nourished, in no distress ENT - no thrush, no pallor/icterus,no post nasal drip Neck: No JVD, no thyromegaly, no carotid bruits Lungs: no use of accessory muscles, no dullness to percussion, clear without rales or rhonchi  Cardiovascular: Rhythm regular, heart sounds  normal, no murmurs or gallops, no peripheral edema Musculoskeletal: No deformities, no cyanosis or clubbing        Assessment & Plan:

## 2023-11-08 NOTE — Assessment & Plan Note (Signed)
Refills on Breo as needed, use albuterol for rescue. She has obtained flu vaccination

## 2023-11-08 NOTE — Assessment & Plan Note (Signed)
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  Will obtain next 1 in 9 months around May 2025

## 2023-11-10 ENCOUNTER — Telehealth: Payer: Self-pay | Admitting: Internal Medicine

## 2023-11-10 NOTE — Telephone Encounter (Signed)
Pt states she has been given tons of meds for her diarrhea. States she had to back off of viberzi as it was making her feel sick and she would have to lay down. Reports colestid does not work, she tried it for 2 nights and says it doesn't help. States when she  has a BM it is painful and she has to take 2 ibuprofen daily for that discomfort. The best thing that works for her is the lomotil and it works pretty good for her if she takes it tid. Pt wants to know if it is ok for her to stay on the lomotil tid. Please advise.

## 2023-11-10 NOTE — Telephone Encounter (Signed)
Left detailed message on pt's voice mail

## 2023-11-10 NOTE — Telephone Encounter (Signed)
Inbound call from patient, wanting to speak to a nurse in regards to "all her medication" that she has been prescribed for her diarrhea. Patient did not give any additional information.

## 2023-11-10 NOTE — Telephone Encounter (Signed)
Yes can use lomotil 1 tab TID Would stop the colestipol and Viberzi JMP

## 2023-11-16 ENCOUNTER — Other Ambulatory Visit: Payer: Medicare Other

## 2023-11-16 NOTE — Discharge Instructions (Signed)

## 2023-11-21 ENCOUNTER — Inpatient Hospital Stay
Admission: RE | Admit: 2023-11-21 | Discharge: 2023-11-21 | Disposition: A | Discharge status: Home / Self Care | Payer: Medicare Other | Source: Ambulatory Visit | Attending: Orthopaedic Surgery | Admitting: Orthopaedic Surgery

## 2023-11-21 ENCOUNTER — Inpatient Hospital Stay: Admission: RE | Admit: 2023-11-21 | Payer: Medicare Other | Source: Ambulatory Visit

## 2023-12-12 ENCOUNTER — Telehealth: Payer: Self-pay | Admitting: *Deleted

## 2023-12-12 NOTE — Telephone Encounter (Signed)
Copied from CRM [id number]. Topic: Clinical - Medical Advice >> Dec 12, 2023  9:45 AM Denese Killings wrote: Reason for CRM: Patient wants to know if her doctor can call her in a prescription for Antibiotic and Prednisone for her cold and cough. She states husband was seen last week for a cold and he has passed it to her.  Spoke with patient advise need an OV for assessment and treatment  Please schedule an OV with any open provider  Thanks

## 2023-12-15 ENCOUNTER — Other Ambulatory Visit: Payer: Self-pay

## 2023-12-15 MED ORDER — DIPHENOXYLATE-ATROPINE 2.5-0.025 MG PO TABS: 1.0000 | ORAL_TABLET | Freq: Four times a day (QID) | ORAL | 1 refills | Status: AC | PRN

## 2023-12-18 DIAGNOSIS — R051 Acute cough: Secondary | ICD-10-CM | POA: Diagnosis not present

## 2023-12-18 DIAGNOSIS — J441 Chronic obstructive pulmonary disease with (acute) exacerbation: Secondary | ICD-10-CM | POA: Diagnosis not present

## 2023-12-23 ENCOUNTER — Telehealth: Payer: Self-pay | Admitting: Pulmonary Disease

## 2023-12-23 ENCOUNTER — Other Ambulatory Visit (HOSPITAL_BASED_OUTPATIENT_CLINIC_OR_DEPARTMENT_OTHER): Payer: Self-pay

## 2023-12-23 MED ORDER — FLUTICASONE FUROATE-VILANTEROL 200-25 MCG/ACT IN AEPB: 1.0000 | INHALATION_SPRAY | Freq: Every day | RESPIRATORY_TRACT | 6 refills | Status: DC

## 2023-12-23 NOTE — Telephone Encounter (Signed)
 I called pt and verbally told her the medications were sent in. Pt verbalized understanding. Nothing further needed.

## 2023-12-23 NOTE — Telephone Encounter (Signed)
 Patient states needs refill for generic Breo inhaler. Pharmacy is [facility] [address]. Patient phone number is [phone].

## 2023-12-23 NOTE — Telephone Encounter (Signed)
 Medications sent

## 2023-12-29 ENCOUNTER — Other Ambulatory Visit: Payer: Self-pay | Admitting: Family Medicine

## 2023-12-29 NOTE — Telephone Encounter (Signed)
 Copied from CRM [id number]. Topic: Clinical - Medication Refill >> Dec 29, 2023  1:51 PM Kinnie DEL wrote: Most Recent Primary Care Visit:  Provider: KENNYTH WORTH HERO  Department: [facility]  Visit Type: OFFICE VISIT  Date: 11/07/2023  Medication: losartan  (COZAAR ) 25 MG tablet   Has the patient contacted their pharmacy? No (Agent: If no, request that the patient contact the pharmacy for the refill. If patient does not wish to contact the pharmacy document the reason why and proceed with request.) (Agent: If yes, when and what did the pharmacy advise?)  Is this the correct pharmacy for this prescription? Yes If no, delete pharmacy and type the correct one.  This is the patient's preferred pharmacy:  [facility] [address] [place] - [address] [place] [address] Phone: [phone] Fax: [phone]  [id number] [place], [place] - [address] AT [facility] [address] [place] [address] Phone: [phone] Fax: [phone]   Has the prescription been filled recently? No  Is the patient out of the medication? No  Has the patient been seen for an appointment in the last year OR does the patient have an upcoming appointment? Yes  Can we respond through MyChart? No  Agent: Please be advised that Rx refills may take up to 3 business days. We ask that you follow-up with your pharmacy.

## 2023-12-30 MED ORDER — LOSARTAN POTASSIUM 25 MG PO TABS: 25.0000 mg | ORAL_TABLET | Freq: Every day | ORAL | 3 refills | Status: AC

## 2023-12-30 NOTE — Telephone Encounter (Signed)
 Patient last seen in office 11/07/23. Medication last ordered 11/07/23.

## 2024-01-03 ENCOUNTER — Ambulatory Visit (INDEPENDENT_AMBULATORY_CARE_PROVIDER_SITE_OTHER): Payer: Medicare Other | Admitting: Family Medicine

## 2024-01-03 ENCOUNTER — Encounter: Payer: Self-pay | Admitting: Family Medicine

## 2024-01-03 VITALS — BP 128/79 | HR 86 | Temp 98.0°F | Ht 59.0 in | Wt 124.4 lb

## 2024-01-03 DIAGNOSIS — J029 Acute pharyngitis, unspecified: Secondary | ICD-10-CM | POA: Diagnosis not present

## 2024-01-03 DIAGNOSIS — J410 Simple chronic bronchitis: Secondary | ICD-10-CM | POA: Diagnosis not present

## 2024-01-03 DIAGNOSIS — I10 Essential (primary) hypertension: Secondary | ICD-10-CM | POA: Diagnosis not present

## 2024-01-03 LAB — POC COVID19 BINAXNOW: SARS Coronavirus 2 Ag: NEGATIVE

## 2024-01-03 LAB — POCT RAPID STREP A (OFFICE): Rapid Strep A Screen: NEGATIVE

## 2024-01-03 MED ORDER — DOXYCYCLINE HYCLATE 100 MG PO TABS: 100.0000 mg | ORAL_TABLET | Freq: Two times a day (BID) | ORAL | 0 refills | Status: AC

## 2024-01-03 MED ORDER — PREDNISONE 10 MG PO TABS: 10.0000 mg | ORAL_TABLET | Freq: Every day | ORAL | 0 refills | Status: DC

## 2024-01-03 NOTE — Patient Instructions (Signed)
 It was very nice to see you today!  You have a flare of your bronchitis.  Please start the antibiotic and prednisone .  Let us  know if not improving.  Return if symptoms worsen or fail to improve.   Take care, Dr Kennyth  PLEASE NOTE:  If you had any lab tests, please let us  know if you have not heard back within a few days. You may see your results on mychart before we have a chance to review them but we will give you a call once they are reviewed by us .   If we ordered any referrals today, please let us  know if you have not heard from their office within the next week.   If you had any urgent prescriptions sent in today, please check with the pharmacy within an hour of our visit to make sure the prescription was transmitted appropriately.   Please try these tips to maintain a healthy lifestyle:  Eat at least 3 REAL meals and 1-2 snacks per day.  Aim for no more than 5 hours between eating.  If you eat breakfast, please do so within one hour of getting up.   Each meal should contain half fruits/vegetables, one quarter protein, and one quarter carbs (no bigger than a computer mouse)  Cut down on sweet beverages. This includes juice, soda, and sweet tea.   Drink at least 1 glass of water  with each meal and aim for at least 8 glasses per day  Exercise at least 150 minutes every week.

## 2024-01-03 NOTE — Progress Notes (Signed)
   Nichole Delgado is a 73 y.o. female who presents today for an office visit.  Assessment/Plan:  New/Acute Problems: Onychomycosis  This has been present for many years.  We did discuss 38-month course of terbinafine  however she deferred for today.  Chronic Problems Addressed Today: Chronic bronchitis (HCC) Patient with mild flare likely due to her URI from a week ago.  Overall no signs of respiratory distress though does have some increased wheezing and sputum production.  Will treat with course of doxycycline  and prednisone .  She has albuterol  inhaler that she can use at home as well.  Encouraged hydration.  She will let us  know if not improving.  We discussed reasons to return to care and seek emergent care.  Essential hypertension Blood pressure at goal on losartan  25 mg daily.    Subjective:  HPI:  See Assessment / plan for status of chronic conditions. Patient here with a week of a cough. Some sore throat. Cough is productive of thick brown mucus. She has had some nasal congestion.  No treatments tried. No fevers or chills. Alittle shortness of breath. Some wheezing. Symptoms staying about the same.        Objective:  Physical Exam: BP 128/79   Pulse 86   Temp 98 F (36.7 C) (Temporal)   Ht 4' 11 (1.499 m)   Wt 124 lb 6.4 oz (56.4 kg)   SpO2 96%   BMI 25.13 kg/m   Gen: No acute distress, resting comfortably CV: Regular rate and rhythm with no murmurs appreciated Pulm: Normal work of breathing, occasional end expiratory wheeze noted. Skin: Onychomycosis noted on right great toenail. Neuro: Grossly normal, moves all extremities Psych: Normal affect and thought content      Raphael Fitzpatrick M. Kennyth, MD 01/03/2024 1:31 PM

## 2024-01-03 NOTE — Assessment & Plan Note (Signed)
Blood pressure at goal on losartan 25 mg daily.

## 2024-01-03 NOTE — Assessment & Plan Note (Signed)
 Patient with mild flare likely due to her URI from a week ago.  Overall no signs of respiratory distress though does have some increased wheezing and sputum production.  Will treat with course of doxycycline  and prednisone .  She has albuterol  inhaler that she can use at home as well.  Encouraged hydration.  She will let us  know if not improving.  We discussed reasons to return to care and seek emergent care.

## 2024-01-05 DIAGNOSIS — F250 Schizoaffective disorder, bipolar type: Secondary | ICD-10-CM | POA: Diagnosis not present

## 2024-01-09 DIAGNOSIS — Z79891 Long term (current) use of opiate analgesic: Secondary | ICD-10-CM | POA: Diagnosis not present

## 2024-01-09 DIAGNOSIS — M79671 Pain in right foot: Secondary | ICD-10-CM | POA: Diagnosis not present

## 2024-01-09 DIAGNOSIS — G8929 Other chronic pain: Secondary | ICD-10-CM | POA: Diagnosis not present

## 2024-01-09 DIAGNOSIS — G9050 Complex regional pain syndrome I, unspecified: Secondary | ICD-10-CM | POA: Diagnosis not present

## 2024-01-13 ENCOUNTER — Other Ambulatory Visit: Payer: Self-pay | Admitting: Family Medicine

## 2024-01-16 ENCOUNTER — Other Ambulatory Visit: Payer: Self-pay | Admitting: Family

## 2024-01-16 DIAGNOSIS — N6320 Unspecified lump in the left breast, unspecified quadrant: Secondary | ICD-10-CM | POA: Diagnosis not present

## 2024-01-16 DIAGNOSIS — Z112 Encounter for screening for other bacterial diseases: Secondary | ICD-10-CM | POA: Diagnosis not present

## 2024-01-16 DIAGNOSIS — N760 Acute vaginitis: Secondary | ICD-10-CM | POA: Diagnosis not present

## 2024-01-18 DIAGNOSIS — M47896 Other spondylosis, lumbar region: Secondary | ICD-10-CM | POA: Diagnosis not present

## 2024-01-18 DIAGNOSIS — G894 Chronic pain syndrome: Secondary | ICD-10-CM | POA: Diagnosis not present

## 2024-01-18 DIAGNOSIS — Z6826 Body mass index (BMI) 26.0-26.9, adult: Secondary | ICD-10-CM | POA: Diagnosis not present

## 2024-01-20 ENCOUNTER — Telehealth: Payer: Self-pay | Admitting: Pulmonary Disease

## 2024-01-20 NOTE — Telephone Encounter (Signed)
Fax received from Dr. Sharolyn Douglas with [facility] to perform a removal SCS under general anesthesia on patient.  Patient needs surgery clearance. Surgery is TBD. Patient was seen on 11/08/23. Office protocol is a risk assessment can be sent to surgeon if patient has been seen in 60 days or less.   Sending to Dr. Vassie Loll for risk assessment or recommendations if patient needs to be seen in office prior to surgical procedure.

## 2024-01-24 ENCOUNTER — Ambulatory Visit
Admission: RE | Admit: 2024-01-24 | Discharge: 2024-01-24 | Disposition: A | Discharge status: Home / Self Care | Payer: Medicare Other | Source: Ambulatory Visit | Attending: Family | Admitting: Family

## 2024-01-24 DIAGNOSIS — N6325 Unspecified lump in the left breast, overlapping quadrants: Secondary | ICD-10-CM | POA: Diagnosis not present

## 2024-01-24 DIAGNOSIS — N393 Stress incontinence (female) (male): Secondary | ICD-10-CM | POA: Diagnosis not present

## 2024-01-24 DIAGNOSIS — N6002 Solitary cyst of left breast: Secondary | ICD-10-CM | POA: Diagnosis not present

## 2024-01-24 DIAGNOSIS — N6320 Unspecified lump in the left breast, unspecified quadrant: Secondary | ICD-10-CM

## 2024-01-24 DIAGNOSIS — D414 Neoplasm of uncertain behavior of bladder: Secondary | ICD-10-CM | POA: Diagnosis not present

## 2024-01-24 NOTE — Telephone Encounter (Signed)
Oretha Milch, MD to [facility] Clear[facility]     01/20/24  3:24 PM Cleared from pulmonary standpoint  with due risk  Copy of this note faxed to Dr Wells Guiles office.

## 2024-01-26 ENCOUNTER — Telehealth: Payer: Self-pay | Admitting: Pulmonary Disease

## 2024-01-26 NOTE — Telephone Encounter (Signed)
 Patient states that Dr.Cohen's office has not received surgical clearance form yet. Please re-fax

## 2024-01-27 DIAGNOSIS — M47896 Other spondylosis, lumbar region: Secondary | ICD-10-CM | POA: Diagnosis not present

## 2024-01-27 NOTE — Telephone Encounter (Signed)
 [facility] calling stating they have not received the surgical clearance please re-fax [phone]

## 2024-01-31 NOTE — Telephone Encounter (Signed)
Note from Dr Vassie Loll 01/20/24 was faxed to Dr Noel Gerold at the number provided. Closing this encounter.

## 2024-02-03 ENCOUNTER — Other Ambulatory Visit: Payer: Self-pay | Admitting: Internal Medicine

## 2024-02-06 DIAGNOSIS — Z01810 Encounter for preprocedural cardiovascular examination: Secondary | ICD-10-CM | POA: Diagnosis not present

## 2024-02-08 ENCOUNTER — Other Ambulatory Visit: Payer: Medicare Other

## 2024-02-08 DIAGNOSIS — M79671 Pain in right foot: Secondary | ICD-10-CM | POA: Diagnosis not present

## 2024-02-08 DIAGNOSIS — G9050 Complex regional pain syndrome I, unspecified: Secondary | ICD-10-CM | POA: Diagnosis not present

## 2024-02-08 DIAGNOSIS — Z79891 Long term (current) use of opiate analgesic: Secondary | ICD-10-CM | POA: Diagnosis not present

## 2024-02-08 DIAGNOSIS — G8929 Other chronic pain: Secondary | ICD-10-CM | POA: Diagnosis not present

## 2024-02-13 DIAGNOSIS — G894 Chronic pain syndrome: Secondary | ICD-10-CM | POA: Diagnosis not present

## 2024-02-13 DIAGNOSIS — T85193A Other mechanical complication of implanted electronic neurostimulator, generator, initial encounter: Secondary | ICD-10-CM | POA: Diagnosis not present

## 2024-02-13 DIAGNOSIS — T85112A Breakdown (mechanical) of implanted electronic neurostimulator (electrode) of spinal cord, initial encounter: Secondary | ICD-10-CM | POA: Diagnosis not present

## 2024-02-13 DIAGNOSIS — Z882 Allergy status to sulfonamides status: Secondary | ICD-10-CM | POA: Diagnosis not present

## 2024-02-13 DIAGNOSIS — Z885 Allergy status to narcotic agent status: Secondary | ICD-10-CM | POA: Diagnosis not present

## 2024-02-13 DIAGNOSIS — Z79899 Other long term (current) drug therapy: Secondary | ICD-10-CM | POA: Diagnosis not present

## 2024-02-13 DIAGNOSIS — T85840A Pain due to nervous system prosthetic devices, implants and grafts, initial encounter: Secondary | ICD-10-CM | POA: Diagnosis not present

## 2024-02-13 DIAGNOSIS — K219 Gastro-esophageal reflux disease without esophagitis: Secondary | ICD-10-CM | POA: Diagnosis not present

## 2024-02-13 DIAGNOSIS — F17210 Nicotine dependence, cigarettes, uncomplicated: Secondary | ICD-10-CM | POA: Diagnosis not present

## 2024-02-13 DIAGNOSIS — T85113A Breakdown (mechanical) of implanted electronic neurostimulator, generator, initial encounter: Secondary | ICD-10-CM | POA: Diagnosis not present

## 2024-02-13 DIAGNOSIS — G43019 Migraine without aura, intractable, without status migrainosus: Secondary | ICD-10-CM | POA: Diagnosis not present

## 2024-02-13 DIAGNOSIS — Z880 Allergy status to penicillin: Secondary | ICD-10-CM | POA: Diagnosis not present

## 2024-02-13 DIAGNOSIS — Z888 Allergy status to other drugs, medicaments and biological substances status: Secondary | ICD-10-CM | POA: Diagnosis not present

## 2024-02-14 ENCOUNTER — Ambulatory Visit: Payer: Medicare Other

## 2024-02-20 ENCOUNTER — Ambulatory Visit: Payer: Self-pay | Admitting: Family Medicine

## 2024-02-20 NOTE — Telephone Encounter (Signed)
 Spoke with patient, asvise to schedule an OV or UC visit  Patient stated will se if she feels any better in a few days, if not changes will give [place] a call for appt

## 2024-02-20 NOTE — Telephone Encounter (Signed)
 Chief Complaint: chills/sweats Symptoms: chills/sweats Frequency: Ongoing since 02/25 Pertinent Negatives: Patient denies fever, uncontrolled pain, N/V Disposition: [] ED /[] Urgent Care (no appt availability in office) / [] Appointment(In office/virtual)/ []  [facility]/ [] Home Care/ [] Refused Recommended Disposition /[] [facility]/ [x]  Follow-up with PCP Additional Notes: Pt reports she had spinal stimulator removed on 02/24, she notes on 02/25 she began to develop episodes of chills/sweats. Pt denies fever, uncontrolled pain, N/V. Pt reports she was constipated for about 4 days but has since had BM and is going regularly again. Pt asking provider review and advise. Routing for review. This RN educated pt on home care, new-worsening symptoms, when to call back/seek emergent care. Pt verbalized understanding and agrees to plan.   Copied from CRM [id number]. Topic: Clinical - Medical Advice >> Feb 20, 2024  9:36 AM Elizebeth Brooking wrote: Reason for CRM: Patient called in stating she had surgery on 02/24 dont know if it has to do  Anesthesia but one minute she is cold and the next she is sweating so much . She stated she will like for a nurse to give her a callback on if she make an appointment to see the doctor Reason for Disposition  [1] Caller has NON-URGENT question AND [2] triager unable to answer question  Answer Assessment - Initial Assessment Questions 1. SYMPTOM: "What's the main symptom you're concerned about?" (e.g., pain, fever, vomiting)     Extreme chills/sweats 2. ONSET: "When did chills/sweats  start?"     Day after surgery 3. SURGERY: "What surgery did you have?"     Had spinal stimulator removed 4. DATE of SURGERY: "When was the surgery?"      02/24 5. ANESTHESIA: " What type of anesthesia did you have?" (e.g., general, spinal, epidural, local)     general 6. PAIN: "Is there any pain?" If Yes, ask: "How bad is it?"  (Scale 1-10; or mild, moderate, severe)     As  expected 7. FEVER: "Do you have a fever?" If Yes, ask: "What is your temperature, how was it measured, and when did it start?"     None 8. VOMITING: "Is there any vomiting?" If Yes, ask: "How many times?"     None 9. BLEEDING: "Is there any bleeding?" If Yes, ask: "How much?" and "Where?"     None 10. OTHER SYMPTOMS: "Do you have any other symptoms?" (e.g., drainage from wound, painful urination, constipation)       None  Protocols used: Post-Op Symptoms and Questions-A-AH

## 2024-02-23 DIAGNOSIS — Z1321 Encounter for screening for nutritional disorder: Secondary | ICD-10-CM | POA: Diagnosis not present

## 2024-02-23 DIAGNOSIS — Z130 Encounter for screening for diseases of the blood and blood-forming organs and certain disorders involving the immune mechanism: Secondary | ICD-10-CM | POA: Diagnosis not present

## 2024-02-23 DIAGNOSIS — Z13228 Encounter for screening for other metabolic disorders: Secondary | ICD-10-CM | POA: Diagnosis not present

## 2024-02-23 DIAGNOSIS — Z1329 Encounter for screening for other suspected endocrine disorder: Secondary | ICD-10-CM | POA: Diagnosis not present

## 2024-02-23 DIAGNOSIS — Z13220 Encounter for screening for lipoid disorders: Secondary | ICD-10-CM | POA: Diagnosis not present

## 2024-02-23 DIAGNOSIS — Z131 Encounter for screening for diabetes mellitus: Secondary | ICD-10-CM | POA: Diagnosis not present

## 2024-02-26 ENCOUNTER — Other Ambulatory Visit: Payer: Self-pay | Admitting: Gastroenterology

## 2024-03-07 DIAGNOSIS — Z79891 Long term (current) use of opiate analgesic: Secondary | ICD-10-CM | POA: Diagnosis not present

## 2024-03-07 DIAGNOSIS — G8929 Other chronic pain: Secondary | ICD-10-CM | POA: Diagnosis not present

## 2024-03-07 DIAGNOSIS — M79671 Pain in right foot: Secondary | ICD-10-CM | POA: Diagnosis not present

## 2024-03-07 DIAGNOSIS — G9050 Complex regional pain syndrome I, unspecified: Secondary | ICD-10-CM | POA: Diagnosis not present

## 2024-03-08 ENCOUNTER — Telehealth: Payer: Self-pay | Admitting: Gastroenterology

## 2024-03-08 NOTE — Telephone Encounter (Signed)
 This is a Dr Rhea Belton pt will send to Pod A

## 2024-03-08 NOTE — Telephone Encounter (Signed)
 Left message for pt to call back

## 2024-03-08 NOTE — Telephone Encounter (Signed)
 Patient called and stated that she had a IBS flare up so bad last night that she was in a lot of pain to the point it had her sweating. Patient was wondering if possible to prescribe her a low dose of Nordtripadine 15 mg.Patient is requesting a call back today if possible. Please advise.

## 2024-03-09 NOTE — Telephone Encounter (Signed)
 Patient is returning your call.

## 2024-03-13 DIAGNOSIS — G894 Chronic pain syndrome: Secondary | ICD-10-CM | POA: Diagnosis not present

## 2024-03-15 DIAGNOSIS — R61 Generalized hyperhidrosis: Secondary | ICD-10-CM | POA: Diagnosis not present

## 2024-03-15 DIAGNOSIS — M8580 Other specified disorders of bone density and structure, unspecified site: Secondary | ICD-10-CM | POA: Diagnosis not present

## 2024-03-15 DIAGNOSIS — Z01419 Encounter for gynecological examination (general) (routine) without abnormal findings: Secondary | ICD-10-CM | POA: Diagnosis not present

## 2024-03-15 DIAGNOSIS — Z6826 Body mass index (BMI) 26.0-26.9, adult: Secondary | ICD-10-CM | POA: Diagnosis not present

## 2024-03-20 DIAGNOSIS — Z789 Other specified health status: Secondary | ICD-10-CM | POA: Diagnosis not present

## 2024-03-20 DIAGNOSIS — L538 Other specified erythematous conditions: Secondary | ICD-10-CM | POA: Diagnosis not present

## 2024-03-20 DIAGNOSIS — L820 Inflamed seborrheic keratosis: Secondary | ICD-10-CM | POA: Diagnosis not present

## 2024-03-20 DIAGNOSIS — L2989 Other pruritus: Secondary | ICD-10-CM | POA: Diagnosis not present

## 2024-03-23 ENCOUNTER — Ambulatory Visit: Payer: Medicare Other | Admitting: Gastroenterology

## 2024-03-27 ENCOUNTER — Other Ambulatory Visit: Payer: Self-pay | Admitting: Orthopaedic Surgery

## 2024-03-27 DIAGNOSIS — M5416 Radiculopathy, lumbar region: Secondary | ICD-10-CM

## 2024-03-27 DIAGNOSIS — M4317 Spondylolisthesis, lumbosacral region: Secondary | ICD-10-CM

## 2024-04-10 DIAGNOSIS — F250 Schizoaffective disorder, bipolar type: Secondary | ICD-10-CM | POA: Diagnosis not present

## 2024-04-11 ENCOUNTER — Other Ambulatory Visit: Payer: Self-pay | Admitting: Family Medicine

## 2024-04-12 DIAGNOSIS — H52203 Unspecified astigmatism, bilateral: Secondary | ICD-10-CM | POA: Diagnosis not present

## 2024-04-12 DIAGNOSIS — Z961 Presence of intraocular lens: Secondary | ICD-10-CM | POA: Diagnosis not present

## 2024-04-12 DIAGNOSIS — H5213 Myopia, bilateral: Secondary | ICD-10-CM | POA: Diagnosis not present

## 2024-04-12 DIAGNOSIS — H524 Presbyopia: Secondary | ICD-10-CM | POA: Diagnosis not present

## 2024-04-12 DIAGNOSIS — E113291 Type 2 diabetes mellitus with mild nonproliferative diabetic retinopathy without macular edema, right eye: Secondary | ICD-10-CM | POA: Diagnosis not present

## 2024-04-12 DIAGNOSIS — H43813 Vitreous degeneration, bilateral: Secondary | ICD-10-CM | POA: Diagnosis not present

## 2024-04-12 DIAGNOSIS — H04123 Dry eye syndrome of bilateral lacrimal glands: Secondary | ICD-10-CM | POA: Diagnosis not present

## 2024-04-12 LAB — HM DIABETES EYE EXAM

## 2024-04-19 DIAGNOSIS — G8929 Other chronic pain: Secondary | ICD-10-CM | POA: Diagnosis not present

## 2024-04-19 DIAGNOSIS — Z79891 Long term (current) use of opiate analgesic: Secondary | ICD-10-CM | POA: Diagnosis not present

## 2024-04-19 DIAGNOSIS — M79671 Pain in right foot: Secondary | ICD-10-CM | POA: Diagnosis not present

## 2024-04-19 DIAGNOSIS — G9050 Complex regional pain syndrome I, unspecified: Secondary | ICD-10-CM | POA: Diagnosis not present

## 2024-04-23 ENCOUNTER — Ambulatory Visit
Admission: RE | Admit: 2024-04-23 | Discharge: 2024-04-23 | Disposition: A | Discharge status: Home / Self Care | Source: Ambulatory Visit | Attending: Orthopaedic Surgery | Admitting: Orthopaedic Surgery

## 2024-04-23 DIAGNOSIS — M4317 Spondylolisthesis, lumbosacral region: Secondary | ICD-10-CM

## 2024-04-23 DIAGNOSIS — M5416 Radiculopathy, lumbar region: Secondary | ICD-10-CM

## 2024-04-24 ENCOUNTER — Telehealth (HOSPITAL_BASED_OUTPATIENT_CLINIC_OR_DEPARTMENT_OTHER): Payer: Self-pay

## 2024-04-24 ENCOUNTER — Other Ambulatory Visit: Payer: Self-pay | Admitting: Orthopaedic Surgery

## 2024-04-24 ENCOUNTER — Encounter (HOSPITAL_BASED_OUTPATIENT_CLINIC_OR_DEPARTMENT_OTHER): Payer: Self-pay

## 2024-04-24 DIAGNOSIS — T182XXA Foreign body in stomach, initial encounter: Secondary | ICD-10-CM

## 2024-04-24 NOTE — Telephone Encounter (Signed)
 Patient states that Dr. Villa Greaser did not send the prescription for fluticasone  furoate-vilanterol (BREO ELLIPTA ) 200-25 MCG/ACT AEPB to her discount drug program - Patient states this needs to be faxed: The prescription, doctor signature, and a fax cover sheet to the Breo manufacturer.

## 2024-05-01 ENCOUNTER — Ambulatory Visit
Admission: RE | Admit: 2024-05-01 | Discharge: 2024-05-01 | Disposition: A | Discharge status: Home / Self Care | Source: Ambulatory Visit | Attending: Orthopaedic Surgery | Admitting: Orthopaedic Surgery

## 2024-05-01 ENCOUNTER — Telehealth (HOSPITAL_BASED_OUTPATIENT_CLINIC_OR_DEPARTMENT_OTHER): Payer: Self-pay

## 2024-05-01 DIAGNOSIS — T182XXA Foreign body in stomach, initial encounter: Secondary | ICD-10-CM

## 2024-05-01 DIAGNOSIS — M5416 Radiculopathy, lumbar region: Secondary | ICD-10-CM | POA: Diagnosis not present

## 2024-05-01 DIAGNOSIS — Z9889 Other specified postprocedural states: Secondary | ICD-10-CM | POA: Diagnosis not present

## 2024-05-01 DIAGNOSIS — Z981 Arthrodesis status: Secondary | ICD-10-CM | POA: Diagnosis not present

## 2024-05-01 NOTE — Telephone Encounter (Signed)
 Copied from CRM [id number]. Topic: Clinical - Prescription Issue >> Apr 30, 2024  1:19 PM [name] wrote: Reason for CRM: Patient states she has called 6 times for this same issue:  "Patient states that Dr. Villa Greaser did not send the prescription for fluticasone  furoate-vilanterol (BREO ELLIPTA ) 200-25 MCG/ACT AEPB to her discount drug program - Patient states this needs to be faxed: The prescription  doctor signature, and a fax cover sheet to the [facility] manufacturer."  Phone number: [phone]

## 2024-05-02 ENCOUNTER — Other Ambulatory Visit (HOSPITAL_BASED_OUTPATIENT_CLINIC_OR_DEPARTMENT_OTHER): Payer: Self-pay

## 2024-05-02 MED ORDER — FLUTICASONE FUROATE-VILANTEROL 200-25 MCG/ACT IN AEPB: 1.0000 | INHALATION_SPRAY | Freq: Every day | RESPIRATORY_TRACT | 3 refills | Status: DC

## 2024-05-02 NOTE — Telephone Encounter (Signed)
 Left pt message to notify I had sent her a my chart message on the 6th to find out fax number, rx has been printed and waiting for sig from [facility] I was able to call the number for [employer] pt assistance this morning and get fax number [phone] will fax once signed

## 2024-05-03 NOTE — Telephone Encounter (Signed)
 Faxed this morning to [employer]

## 2024-05-08 ENCOUNTER — Ambulatory Visit (HOSPITAL_BASED_OUTPATIENT_CLINIC_OR_DEPARTMENT_OTHER): Admitting: Pulmonary Disease

## 2024-05-08 ENCOUNTER — Encounter: Payer: Self-pay | Admitting: Primary Care

## 2024-05-08 ENCOUNTER — Ambulatory Visit (INDEPENDENT_AMBULATORY_CARE_PROVIDER_SITE_OTHER): Admitting: Primary Care

## 2024-05-08 VITALS — BP 138/67 | HR 88 | Ht <= 58 in | Wt 126.0 lb

## 2024-05-08 DIAGNOSIS — J42 Unspecified chronic bronchitis: Secondary | ICD-10-CM

## 2024-05-08 DIAGNOSIS — R911 Solitary pulmonary nodule: Secondary | ICD-10-CM | POA: Diagnosis not present

## 2024-05-08 DIAGNOSIS — J45909 Unspecified asthma, uncomplicated: Secondary | ICD-10-CM | POA: Diagnosis not present

## 2024-05-08 DIAGNOSIS — G9050 Complex regional pain syndrome I, unspecified: Secondary | ICD-10-CM | POA: Diagnosis not present

## 2024-05-08 DIAGNOSIS — Z9189 Other specified personal risk factors, not elsewhere classified: Secondary | ICD-10-CM

## 2024-05-08 DIAGNOSIS — F17210 Nicotine dependence, cigarettes, uncomplicated: Secondary | ICD-10-CM

## 2024-05-08 DIAGNOSIS — K580 Irritable bowel syndrome with diarrhea: Secondary | ICD-10-CM | POA: Diagnosis not present

## 2024-05-08 MED ORDER — FLUTICASONE FUROATE-VILANTEROL 200-25 MCG/ACT IN AEPB: 1.0000 | INHALATION_SPRAY | Freq: Every day | RESPIRATORY_TRACT | 11 refills | Status: DC

## 2024-05-08 NOTE — Progress Notes (Signed)
 @Patient  ID: Nichole Delgado, female    DOB: 06-Nov-1951, 73 y.o.   MRN: [id number]  No chief complaint on file.   Referring provider: Rodney Clamp, MD  HPI: 73 year old female, current everyday smoker (50-pack-year history).  Past medical history significant for moderate persistent asthma, chronic bronchitis, pulmonary nodule less than 1 cm, hypertension, hyperlipidemia, osteoporosis.  Patient of Dr. Villa Greaser.  Nodule - Not hypermetabolic  but growth in size from 10 to 17 mm from 2021-20 23.    PMH -complex regional pain syndrome after a fall and fractures of both ankles with right foot drop status post spinal cord stimulator, -IBS with diarrhea   Based on bronchodilator response on PFTs, she was started on Symbicort  with a diagnosis of asthma. This caused her heart to race and she was switched to Advair. Advair caused soreness of her throat , now on Breo.  Previous [facility] pulmonary encounter: 11/08/23 19-month follow-up visit She has had a bad year with several deaths in her family.  She is able to take Breo, occasionally has to take albuterol  in the mornings if she has shortness of breath. She is continues to smoke about a pack per day.  She and her husband have decided to start vaping.  She is considering a moved to [place] with her daughter once they get settled down there. We reviewed CT chest from August She has chronic pain and tramadol  makes her nauseous   05/08/2024- Interim hx  Discussed the use of AI scribe software for clinical note transcription with the patient, who gave verbal consent to proceed.  History of Present Illness   Nichole Delgado is a 73 year old female with asthma, chronic bronchitis, and a pulmonary nodule who presents for medication management and follow-up.  Her asthma is well-controlled with Breo 200 mcg, with occasional breakthrough cough. No active flare-ups of asthma symptoms. Uses rescue inhaler twice a week, primarily in the mornings due to  chest tightness. Not currently taking prednisone  and no hospitalizations for respiratory issues in the last six months.  She receives her medication through a patient assistance program with [employer] due to insurance not covering the cost. She pays $250 a month until reaching $600, after which she receives the medication from the manufacturer. Recently encountered an issue with the prescription duration sent to the manufacturer.  Regarding her pulmonary nodule, she is due for a CT scan in May 2025. The nodule has been stable over the past year, but there was some growth from 2021 to 2023.  She has a history of smoking and has cut back recently, although she still smokes. Previous attempts to quit included using patches. She quit once when she was in rehab after an accident. Her husband also smokes, which makes quitting more challenging.  She was recently placed on nortriptyline for IBS with diarrhea, which she has been taking for three weeks. She has a history of complex regional pain syndrome in her feet and ankles, which has spread to her calves. This condition was aggravated by a spinal cord stimulator, which she had removed over two months ago. She follows with pain management for this condition.      Significant tests/ events reviewed  CT chest wo con 07/2023 >> Stable bilateral subsolid pulmonary nodules incl largest LUL CT chest without con 11/2022 persistent left upper lobe nodule 17 x 8 mm PET 08/2022 negative, mild hypermetabolism in vocal cord [facility] chest 07/2022 increased size of groundglass left upper lobe nodule to 14.6 mm  with central solid component measuring 8 mm CT chest 04/2017-10 x 7 mm groundglass opacity in the left upper lobe CT chest 11/2017- stable 10 x 7 mm groundglass opacity in the left upper lobe.  Improvement in small nodular opacities on the right.  CT chest 06/2020 stable 10 x 7 mm left upper lobe nodule   PFTs  05/27/2022 : FVC 65, FEV1 65, ratio 75.  positive bronchodilator  response (19%)   01/2018 FVC 2.10 [81%], FEV1 1.64 [84%], F/F 78, TLC 91%, RV/TLC 128%, DLCO 81% Minimal obstruction, small airways disease   FENO 01/2018-7    Allergies  Allergen Reactions   Penicillins Hives    Has patient had a PCN reaction causing immediate rash, facial/tongue/throat swelling, SOB or lightheadedness with hypotension: No Has patient had a PCN reaction causing severe rash involving mucus membranes or skin necrosis: Yes Has patient had a PCN reaction that required hospitalization No Has patient had a PCN reaction occurring within the last 10 years: No If all of the above answers are "NO", then may proceed with Cephalosporin use.    Sulfa Antibiotics Hives and Rash   Budesonide -Formoterol  Fumarate     Other reaction(s): covid symptoms   Fluticasone -Salmeterol     Other reaction(s): covid symptoms   Codeine Nausea Only   Hydrocodone Rash   Keppra [Levetiracetam] Rash   Other Other (See Comments)    States she has allergies to other seizure medications but doesn't know which ones   Topamax  [Topiramate ] Nausea Only    Weight loss    Immunization History  Administered Date(s) Administered   Fluad Quad(high Dose 65+) 11/26/2020, 09/23/2022   Fluzone Influenza virus vaccine,trivalent (IIV3), split virus 10/24/2007, 09/24/2015, 10/09/2017, 09/24/2019   Influenza, High Dose Seasonal PF 09/26/2014, 09/19/2017, 10/23/2023   Influenza,inj,Quad PF,6-35 Mos 10/10/2018   Influenza-Unspecified 10/20/2021   PFIZER(Purple Top)SARS-COV-2 Vaccination 01/28/2020, 02/22/2020   PNEUMOCOCCAL CONJUGATE-20 08/19/2022   Pfizer Covid-19 Vaccine Bivalent Booster 63yrs & up 01/04/2022   Pfizer(Comirnaty )Fall Seasonal Vaccine 12 years and older 02/25/2023   Pneumococcal Polysaccharide-23 09/30/2001   Tdap 04/19/2008, 08/19/2022   Unspecified SARS-COV-2 Vaccination 01/28/2020, 02/22/2020, 10/20/2020   Zoster, Live 03/22/2008, 03/22/2012    Past Medical History:  Diagnosis Date    Aortic atherosclerosis (HCC)    Asthma    pulmonologist--- dr Villa Greaser  (12-27-2022  per pt last has been a while since last exacerbation)   Cervical spondylosis    Complex regional pain syndrome I    followed by pain clinic, pcp,  has spinal cord stimulator   Coronary artery calcification seen on CAT scan    cardiologist--- dr Maximo Spar;   nuclear stress test in epic 08-07-2022 low risk noishcemia, nuclear ef 73%   DDD (degenerative disc disease), lumbar    GAD (generalized anxiety disorder)    GERD (gastroesophageal reflux disease)    History of basal cell carcinoma (BCC) excision    per pt moh's sx on nose in 2020   History of chronic bronchitis    uses inhaler prn   Hypertension    followed by pcp and dr hilty   Irritable bowel syndrome with diarrhea    followed by dr pyrtle  (GI)   Lesion of bladder    urologist--- dr pace   Migraines    neurology-- [facility] in [place]   Mixed dyslipidemia    followed by lipid clinic--- dr Maximo Spar   Neuropathy    feet   OA (osteoarthritis)    PONV (postoperative nausea and vomiting)  severe   Pre-diabetes    S/P insertion of spinal 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   SUI (stress urinary incontinence, female)    Thyroid  cyst    04-24-2021  pt stated recently dx and has been referred to endocrinologist   Vertigo    Wears glasses     Tobacco History: Social History   Tobacco Use  Smoking Status Former   Current packs/day: 1.00   Average packs/day: 1 pack/day for 1.4 years (1.4 ttl pk-yrs)   Types: Cigarettes   Start date: 2024   Passive exposure: Never  Smokeless Tobacco Never  Tobacco Comments   Smoking 1 ppd 05/06/23 BR   Counseling given: Not Answered Tobacco comments: Smoking 1 ppd 05/06/23 BR   Outpatient Medications Prior to Visit  Medication Sig Dispense Refill   albuterol  (VENTOLIN  HFA) 108 (90 Base) MCG/ACT inhaler Inhale 2 puffs into the lungs every 6 (six) hours as needed for wheezing or shortness of breath.  6.7 g 2   Ascorbic Acid (VITAMIN C) 500 MG CAPS Take 1 tablet by mouth daily.     Ca Phosphate-Cholecalciferol 250-500 MG-UNIT CHEW Chew 1 each by mouth daily.     Cholecalciferol (VITAMIN D3) 50 MCG (2000 UT) TABS Take 1 tablet by mouth daily.     diphenoxylate -atropine  (LOMOTIL ) 2.5-0.025 MG tablet Take 1 tablet by mouth 4 (four) times daily as needed for diarrhea or loose stools. 120 tablet 1   doxycycline  (VIBRA -TABS) 100 MG tablet Take 1 tablet (100 mg total) by mouth 2 (two) times daily. 14 tablet 0   estradiol  (CLIMARA  - DOSED IN MG/24 HR) 0.1 mg/24hr patch Place 0.1 mg onto the skin 2 (two) times a week.     fluticasone  (FLONASE ) 50 MCG/ACT nasal spray Place 1 spray into both nostrils daily.     fluticasone  furoate-vilanterol (BREO ELLIPTA ) 200-25 MCG/ACT AEPB Inhale 1 puff into the lungs daily. 60 each 3   hyoscyamine  (LEVSIN SL) 0.125 MG SL tablet DISSOLVE 1 TABLET IN MOUTH EVERY 6 HOURS AS NEEDED 30 tablet 0   ibuprofen (ADVIL,MOTRIN) 200 MG tablet Take 400 mg by mouth every 6 (six) hours as needed for mild pain.     loperamide (IMODIUM) 2 MG capsule Take 2 mg by mouth as needed.     losartan  (COZAAR ) 25 MG tablet Take 1 tablet (25 mg total) by mouth daily. 90 tablet 3   meclizine  (ANTIVERT ) 25 MG tablet Take 25 mg by mouth 3 (three) times daily as needed for dizziness.     ondansetron  (ZOFRAN ) 8 MG tablet Take by mouth every 8 (eight) hours as needed for nausea or vomiting.     perphenazine  (TRILAFON ) 2 MG tablet Take 2 mg by mouth at bedtime.     pravastatin  (PRAVACHOL ) 40 MG tablet TAKE 1 TABLET BY MOUTH AT BEDTIME** APPOINTMENT REQUIRED FOR FUTURE REFILLS** 90 tablet 0   prazosin  (MINIPRESS ) 1 MG capsule Take 1 capsule (1 mg total) by mouth 2 (two) times daily. (Patient not taking: Reported on 01/03/2024) 60 capsule 4   predniSONE  (DELTASONE ) 10 MG tablet Take 1 tablet (10 mg total) by mouth daily with breakfast. 7 tablet 0   traMADol  (ULTRAM ) 50 MG tablet Take by mouth every 6 (six)  hours as needed.     zolpidem  (AMBIEN ) 10 MG tablet Take 5 mg by mouth at bedtime as needed for sleep.     No facility-administered medications prior to visit.    Review of Systems  Review of Systems  Constitutional: Negative.   HENT:  Negative.    Respiratory:  Positive for cough. Negative for chest tightness, shortness of breath and wheezing.   Cardiovascular: Negative.   Psychiatric/Behavioral: Negative.     Physical Exam  There were no vitals taken for this visit. Physical Exam Constitutional:      Appearance: Normal appearance. She is not ill-appearing.  HENT:     Head: Normocephalic and atraumatic.     Mouth/Throat:     Mouth: Mucous membranes are moist.     Pharynx: Oropharynx is clear.  Cardiovascular:     Rate and Rhythm: Normal rate and regular rhythm.  Pulmonary:     Effort: Pulmonary effort is normal.     Breath sounds: Normal breath sounds.  Musculoskeletal:        General: Normal range of motion.  Skin:    General: Skin is warm and dry.  Neurological:     General: No focal deficit present.     Mental Status: She is alert and oriented to person, place, and time. Mental status is at baseline.  Psychiatric:        Mood and Affect: Mood normal.        Behavior: Behavior normal.        Thought Content: Thought content normal.        Judgment: Judgment normal.      Lab Results:  CBC    Component Value Date/Time   WBC 7.9 05/27/2022 1435   RBC 4.61 05/27/2022 1435   HGB 15.0 12/28/2022 1006   HCT 44.0 12/28/2022 1006   PLT 252.0 05/27/2022 1435   MCV 91.7 05/27/2022 1435   MCH 30.9 08/04/2018 0647   MCHC 33.6 05/27/2022 1435   RDW 13.4 05/27/2022 1435   LYMPHSABS 2.6 05/27/2022 1435   MONOABS 0.6 05/27/2022 1435   EOSABS 0.1 05/27/2022 1435   BASOSABS 0.1 05/27/2022 1435    BMET    Component Value Date/Time   NA 139 12/28/2022 1006   NA 141 06/12/2020 0000   K 3.8 12/28/2022 1006   CL 102 12/28/2022 1006   CO2 27 06/12/2021 1427    GLUCOSE 100 (H) 12/28/2022 1006   BUN 12 12/28/2022 1006   BUN 13 06/12/2020 0000   CREATININE 1.00 12/28/2022 1006   CALCIUM 10.2 06/12/2021 1427   GFRNONAA 61 06/12/2020 0000   GFRAA 74 06/12/2020 0000    BNP No results found for: "BNP"  ProBNP No results found for: "PROBNP"  Imaging: DG Abd 1 View Result Date: 05/01/2024 CLINICAL DATA:  Status post neurostimulator removal. EXAM: ABDOMEN - 1 VIEW COMPARISON:  Chest radiograph dated 12/18/2023. FINDINGS: Interval removal of spinal cord stimulator. No radiopaque wires or leads identified. Nonobstructive bowel-gas pattern. No evidence of free air. Posterior fusion at L5-S1. No acute osseous abnormality. IMPRESSION: Interval removal of spinal cord stimulator. No radiopaque foreign body identified. Electronically Signed   By: Mannie Seek M.D.   On: 05/01/2024 15:57     Assessment & Plan:   No problem-specific Assessment & Plan notes found for this encounter.   1. Pulmonary nodule less than 1 cm in diameter with moderate to high risk for malignant neoplasm (Primary) - CT Chest Wo Contrast; Future  Assessment and Plan    Asthma Asthma is well-controlled with Breo 200mcg, with occasional breakthrough cough. Uses rescue inhaler twice a week, primarily in the morning due to chest tightness. - Encourage smoking cessation  - Prescribe Breo with a year's worth of refills through [employer] patient assistance program. - Schedule follow-up appointments  twice a year for asthma management.  Pulmonary nodule Pulmonary nodule has been stable over the last year, but growth from 2021 to 2023 raises concern for possible slow-growing malignancy. Continued monitoring is necessary.  - Order CT scan in May 2025 to monitor pulmonary nodule. - Discuss potential biopsy if there is a change in size of the nodule.  Chronic bronchitis No changes in management for chronic bronchitis during this visit.  Complex regional pain syndrome Complex regional  pain syndrome affecting feet, ankles, and calves. Spinal cord stimulator was removed as it was aggravating the condition.  Irritable bowel syndrome with diarrhea Managed with nortriptyline, started three weeks ago.   Antonio Baumgarten, NP 05/08/2024

## 2024-05-08 NOTE — Patient Instructions (Addendum)
 -  ASTHMA: Asthma is a condition where your airways narrow and swell, producing extra mucus, which can make breathing difficult. Your asthma is well-controlled with Breo, and you use your rescue inhaler twice a week. We have prescribed Breo with a year's worth of refills through the [employer] patient assistance program. Please continue to use your rescue inhaler as needed and follow up with us  twice a year for asthma management.  -PULMONARY NODULE: A pulmonary nodule is a small growth in the lung that can be benign or malignant. Your nodule has been stable over the past year, but we need to keep monitoring it due to previous growth. We have scheduled a CT scan for May 2025 to check for any changes. If there is any change in size, we may discuss a biopsy to determine if it is malignant.  -COMPLEX REGIONAL PAIN SYNDROME: Complex regional pain syndrome is a chronic pain condition affecting your feet, ankles, and calves. The spinal cord stimulator was removed as it was aggravating your condition. Please continue to follow up with your pain management specialist for ongoing care.  INSTRUCTIONS:  Please follow up with us  twice a year for asthma management. Continue to monitor your pulmonary nodule with a CT scan scheduled for May 2025. If you experience any changes in your symptoms or have concerns about your medications, please contact our office.  Follow-up 6 months with Dr. Villa Greaser ([place] location)

## 2024-05-11 ENCOUNTER — Ambulatory Visit
Admission: RE | Admit: 2024-05-11 | Discharge: 2024-05-11 | Disposition: A | Discharge status: Home / Self Care | Source: Ambulatory Visit | Attending: Primary Care | Admitting: Primary Care

## 2024-05-11 DIAGNOSIS — Z9189 Other specified personal risk factors, not elsewhere classified: Secondary | ICD-10-CM

## 2024-05-11 DIAGNOSIS — I2510 Atherosclerotic heart disease of native coronary artery without angina pectoris: Secondary | ICD-10-CM | POA: Diagnosis not present

## 2024-05-11 DIAGNOSIS — R918 Other nonspecific abnormal finding of lung field: Secondary | ICD-10-CM | POA: Diagnosis not present

## 2024-05-11 DIAGNOSIS — I3139 Other pericardial effusion (noninflammatory): Secondary | ICD-10-CM | POA: Diagnosis not present

## 2024-05-17 ENCOUNTER — Telehealth: Payer: Self-pay | Admitting: Internal Medicine

## 2024-05-17 NOTE — Telephone Encounter (Signed)
 Pt states she has had loose stools and sometimes they float and sometimes they sink. Discussed with pt that she could try adding a fiber supplement to see if that helped her stools bulk up. Pt states she will try this.

## 2024-05-17 NOTE — Telephone Encounter (Signed)
 Inbound call from patient, states her bowel are "unformed" and "some are sinking and some are floating" patient would like to know if this is normal.

## 2024-05-24 DIAGNOSIS — M47816 Spondylosis without myelopathy or radiculopathy, lumbar region: Secondary | ICD-10-CM | POA: Diagnosis not present

## 2024-05-24 DIAGNOSIS — Z1330 Encounter for screening examination for mental health and behavioral disorders, unspecified: Secondary | ICD-10-CM | POA: Diagnosis not present

## 2024-05-24 DIAGNOSIS — Z981 Arthrodesis status: Secondary | ICD-10-CM | POA: Diagnosis not present

## 2024-05-24 DIAGNOSIS — M5416 Radiculopathy, lumbar region: Secondary | ICD-10-CM | POA: Diagnosis not present

## 2024-05-31 DIAGNOSIS — Z79891 Long term (current) use of opiate analgesic: Secondary | ICD-10-CM | POA: Diagnosis not present

## 2024-05-31 DIAGNOSIS — M79671 Pain in right foot: Secondary | ICD-10-CM | POA: Diagnosis not present

## 2024-05-31 DIAGNOSIS — G8929 Other chronic pain: Secondary | ICD-10-CM | POA: Diagnosis not present

## 2024-05-31 DIAGNOSIS — G9050 Complex regional pain syndrome I, unspecified: Secondary | ICD-10-CM | POA: Diagnosis not present

## 2024-06-12 ENCOUNTER — Ambulatory Visit: Admitting: Nurse Practitioner

## 2024-06-13 ENCOUNTER — Ambulatory Visit: Payer: Self-pay | Admitting: Primary Care

## 2024-06-13 ENCOUNTER — Other Ambulatory Visit: Payer: Self-pay

## 2024-06-13 ENCOUNTER — Telehealth: Payer: Self-pay | Admitting: Internal Medicine

## 2024-06-13 DIAGNOSIS — R109 Unspecified abdominal pain: Secondary | ICD-10-CM

## 2024-06-13 NOTE — Telephone Encounter (Signed)
 Pyrtle pt that was scheduled to see [facility] yesterday but reports she had an IBS-D attack and had to leave. She states she is having an attack daily, reports she has terrible abdominal pain when this happens and she is in the bathroom for about an hour. States the pain lasts about 2-3 hours after the stool that is mostly diarrhea. She takes a levsin  when she has the attack and she is taking nortriptyline 25mg  at bedtime. States this helps as she isn't up at night. Reports bentyl  does not help either. She has taken viberzi , lomotil , colestipol  and either didn't like the way they made her feel or she stated they didn't help. Also reports IB gard did not help. She has stopped all meds that slowed her bowels. Pt states she is starving herself to try and not have the attacks and she is hungry. She had been taking Ibuprofen daily for pain but has been told this lowers her platelets and causes her to have bruising. She states she doesnt know what to take or try at this point. Please advise.

## 2024-06-13 NOTE — Telephone Encounter (Signed)
 Patient called and stated that she had an appointment with Wilhelmenia Nyle Sharps, and she had arrived early but was having a flare up of her diverticulitis. Patient states that she does apologizes for not being able to actually seen by Wilhelmenia Nyle Sharps. Patient is worried that pain with her flare up is bad to the point she is unavailable for 3-4 hours due to her pain. Patient is requesting a call back. Please advise.

## 2024-06-13 NOTE — Telephone Encounter (Signed)
 Spoke with pt and she is aware of recommendations per [facility]. She states she would prefer to be seen prior to doing ct or stool test. Pt scheduled to see Ellouise Console PA 06/18/24@11am . Pt aware of appt.

## 2024-06-15 ENCOUNTER — Other Ambulatory Visit: Payer: Self-pay | Admitting: Gastroenterology

## 2024-06-15 NOTE — Progress Notes (Signed)
 Mychart message sent to inform pt. NFN

## 2024-06-17 NOTE — Progress Notes (Unsigned)
 Ellouise Console, PA-C [address] [place], [place]  [address] Phone: [phone]   Primary Care Physician: Kennyth Worth HERO, MD  Primary Gastroenterologist:  Ellouise Console, PA-C / Dr. Gordy Starch   Chief Complaint:  F/U IBS, Diarrhea, Abdominal Pain       HPI:   Nichole Delgado is a 73 y.o. female returns for follow-up of irritable bowel syndrome with chronic diarrhea and abdominal cramping.  In the past she tried Viberzi  which caused nausea and she discontinued.  Colestid  did not help.  Lomotil  works the best for her.  Tried dicyclomine  which made her feel weird.  Currently taking hyoscyamine  as needed for cramping.  Her PCP started her on nortriptyline 25 Mg nightly 6 weeks ago which is helping.  She is sleeping better.  She also started Metamucil daily with some benefit.  She has not taken any Imodium or Lomotil  recently.  For the past 4 days she has not had any abdominal pain.  Bowel movements have become more regular in the past 4 days.    Of note, she is not eating as much and started smoking.  She and her husband (who has Lewy body dementia) are moving to [facility] [place]  in the next month or 2.  Currently renovating their house to put on the market.  PMH:  IBS-D, GERD, hypertension, DDD with complex regional pain syndrome with a spinal cord stimulator, history of anxiety.  History of IBS-D since in her 30s and chronic rectal pain since in her 16s.  07/2023 colonoscopy by Dr. Starch: 3 small (4 mm to 6 mm) tubular adenoma polyps removed.  Mild sigmoid diverticulosis.  Good prep.  Biopsies negative for microscopic colitis.  No repeat colonoscopy was recommended due to advanced age.  08/2014 colonoscopy at [facility]: 1 small hyperplastic polyp removed from sigmoid colon.  Current Outpatient Medications  Medication Sig Dispense Refill   albuterol  (VENTOLIN  HFA) 108 (90 Base) MCG/ACT inhaler Inhale 2 puffs into the lungs every 6 (six) hours as needed for wheezing or  shortness of breath. 6.7 g 2   Ca Phosphate-Cholecalciferol 250-500 MG-UNIT CHEW Chew 1 each by mouth daily.     Cholecalciferol (VITAMIN D3) 50 MCG (2000 UT) TABS Take 1 tablet by mouth daily.     doxycycline  (VIBRA -TABS) 100 MG tablet Take 1 tablet (100 mg total) by mouth 2 (two) times daily. 14 tablet 0   fluticasone  (FLONASE ) 50 MCG/ACT nasal spray Place 1 spray into both nostrils daily.     fluticasone  furoate-vilanterol (BREO ELLIPTA ) 200-25 MCG/ACT AEPB Inhale 1 puff into the lungs daily. 60 each 11   hyoscyamine  (LEVSIN  SL) 0.125 MG SL tablet DISSOLVE 1 TABLET IN MOUTH EVERY 6 HOURS AS NEEDED 30 tablet 0   ibuprofen (ADVIL,MOTRIN) 200 MG tablet Take 400 mg by mouth every 6 (six) hours as needed for mild pain.     losartan  (COZAAR ) 25 MG tablet Take 1 tablet (25 mg total) by mouth daily. 90 tablet 3   meclizine  (ANTIVERT ) 25 MG tablet Take 25 mg by mouth 3 (three) times daily as needed for dizziness.     nortriptyline (PAMELOR) 25 MG capsule Take 25 mg by mouth at bedtime.     ondansetron  (ZOFRAN ) 8 MG tablet Take by mouth every 8 (eight) hours as needed for nausea or vomiting.     perphenazine  (TRILAFON ) 2 MG tablet Take 2 mg by mouth at bedtime.     pravastatin  (PRAVACHOL ) 40 MG tablet TAKE 1  TABLET BY MOUTH AT BEDTIME** APPOINTMENT REQUIRED FOR FUTURE REFILLS** 90 tablet 0   prazosin  (MINIPRESS ) 1 MG capsule Take 1 capsule (1 mg total) by mouth 2 (two) times daily. 60 capsule 4   traMADol  (ULTRAM ) 50 MG tablet Take by mouth every 6 (six) hours as needed.     zolpidem  (AMBIEN ) 10 MG tablet Take 5 mg by mouth at bedtime as needed for sleep.     Ascorbic Acid (VITAMIN C) 500 MG CAPS Take 1 tablet by mouth daily. (Patient not taking: Reported on 06/18/2024)     diphenoxylate -atropine  (LOMOTIL ) 2.5-0.025 MG tablet Take 1 tablet by mouth 4 (four) times daily as needed for diarrhea or loose stools. (Patient not taking: Reported on 06/18/2024) 120 tablet 1   estradiol  (CLIMARA  - DOSED IN MG/24 HR)  0.1 mg/24hr patch Place 0.1 mg onto the skin 2 (two) times a week. (Patient not taking: Reported on 06/18/2024)     loperamide (IMODIUM) 2 MG capsule Take 2 mg by mouth as needed. (Patient not taking: Reported on 06/18/2024)     No current facility-administered medications for this visit.    Allergies as of 06/18/2024 - Review Complete 06/18/2024  Allergen Reaction Noted   Penicillins Hives 02/03/2012   Sulfa antibiotics Hives and Rash 02/03/2012   Budesonide -formoterol  fumarate  09/07/2022   Fluticasone -salmeterol  09/07/2022   Codeine Nausea Only 02/03/2012   Hydrocodone Rash 03/31/2021   Keppra [levetiracetam] Rash 08/31/2018   Other Other (See Comments) 01/01/2019   Topamax  [topiramate ] Nausea Only 01/01/2019    Past Medical History:  Diagnosis Date   Aortic atherosclerosis (HCC)    Asthma    pulmonologist--- dr jude  (12-27-2022  per pt last has been a while since last exacerbation)   Cervical spondylosis    Complex regional pain syndrome I    followed by pain clinic, pcp,  has spinal cord stimulator   Coronary artery calcification seen on CAT scan    cardiologist--- dr mona;   nuclear stress test in epic 08-07-2022 low risk noishcemia, nuclear ef 73%   DDD (degenerative disc disease), lumbar    GAD (generalized anxiety disorder)    GERD (gastroesophageal reflux disease)    History of basal cell carcinoma (BCC) excision    per pt moh's sx on nose in 2020   History of chronic bronchitis    uses inhaler prn   Hypertension    followed by pcp and dr hilty   Irritable bowel syndrome with diarrhea    followed by dr albertus  (GI)   Lesion of bladder    urologist--- dr pace   Migraines    neurology-- [facility] in [place]   Mixed dyslipidemia    followed by lipid clinic--- dr mona   Neuropathy    feet   OA (osteoarthritis)    PONV (postoperative nausea and vomiting)    severe   Pre-diabetes    S/P insertion of spinal 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   SUI  (stress urinary incontinence, female)    Thyroid  cyst    04-24-2021  pt stated recently dx and has been referred to endocrinologist   Vertigo    Wears glasses     Past Surgical History:  Procedure Laterality Date   ANKLE HARDWARE REMOVAL Right 08/25/2022   CATARACT EXTRACTION W/ INTRAOCULAR LENS  IMPLANT, BILATERAL  2005   COLONOSCOPY W/ POLYPECTOMY     CYSTOSCOPY N/A 12/28/2022   Procedure: CYSTOSCOPY;  Surgeon: Elisabeth Valli BIRCH, MD;  Location: [facility];  Service: Urology;  Laterality: N/A;   CYSTOSCOPY WITH BIOPSY N/A 05/12/2021   Procedure: CYSTOSCOPY WITH BIOPSY AND FULGERATION;  Surgeon: Elisabeth Valli BIRCH, MD;  Location: [facility];  Service: Urology;  Laterality: N/A;  30 MINS   FOOT SURGERY Right 2011   hammer toe   ORIF ANKLE FRACTURE Left 03/27/2017   Procedure: OPEN REDUCTION INTERNAL FIXATION (ORIF) BIMALLEOLAR ANKLE FRACTURE;  Surgeon: Evalene BIRCH Chancy, MD;  Location: [facility] OR;  Service: Orthopedics;  Laterality: Left;   ORIF CALCANEOUS FRACTURE Right 04/05/2017   Procedure: OPEN REDUCTION INTERNAL FIXATION (ORIF) CALCANEOUS AND FIBULA FRACTURE;  Surgeon: Evalene BIRCH Chancy, MD;  Location: [facility] OR;  Service: Orthopedics;  Laterality: Right;   POSTERIOR LUMBAR FUSION  09-08-2020  @[facility]    L5--S1   SPINAL CORD STIMULATOR INSERTION  08/15/2018   SUPRACERVICAL ABDOMINAL HYSTERECTOMY  1988   Partial   TONSILLECTOMY AND ADENOIDECTOMY  child   TRANSURETHRAL RESECTION OF BLADDER TUMOR N/A 12/28/2022   Procedure: TRANSURETHRAL RESECTION OF BLADDER TUMOR (TURBT);  Surgeon: Elisabeth Valli BIRCH, MD;  Location: [facility];  Service: Urology;  Laterality: N/A;  45 MINS    Review of Systems:    All systems reviewed and negative except where noted in HPI.    Physical Exam:  BP 132/86   Pulse 99   Ht 4' 10 (1.473 m)   Wt 123 lb 6 oz (56 kg)   SpO2 96%   BMI 25.79 kg/m  No LMP recorded. Patient has had a hysterectomy.  General:  Well-nourished, well-developed in no acute distress.  Lungs: Clear to auscultation bilaterally. Non-labored. Heart: Regular rate and rhythm, no murmurs rubs or gallops.  Abdomen: Bowel sounds are normal; Abdomen is Soft; No hepatosplenomegaly, masses or hernias;  No Abdominal Tenderness; No guarding or rebound tenderness. Neuro: Alert and oriented x 3.  Grossly intact.  Psych: Alert and cooperative, normal mood and affect.   Imaging Studies: No results found.  Labs: CBC    Component Value Date/Time   WBC 7.9 05/27/2022 1435   RBC 4.61 05/27/2022 1435   HGB 15.0 12/28/2022 1006   HCT 44.0 12/28/2022 1006   PLT 252.0 05/27/2022 1435   MCV 91.7 05/27/2022 1435   MCH 30.9 08/04/2018 0647   MCHC 33.6 05/27/2022 1435   RDW 13.4 05/27/2022 1435   LYMPHSABS 2.6 05/27/2022 1435   MONOABS 0.6 05/27/2022 1435   EOSABS 0.1 05/27/2022 1435   BASOSABS 0.1 05/27/2022 1435    CMP     Component Value Date/Time   NA 139 12/28/2022 1006   NA 141 06/12/2020 0000   K 3.8 12/28/2022 1006   CL 102 12/28/2022 1006   CO2 27 06/12/2021 1427   GLUCOSE 100 (H) 12/28/2022 1006   BUN 12 12/28/2022 1006   BUN 13 06/12/2020 0000   CREATININE 1.00 12/28/2022 1006   CALCIUM 10.2 06/12/2021 1427   PROT 6.4 03/31/2021 1516   PROT 6.5 07/07/2016 1016   ALBUMIN 4.4 06/12/2021 1427   ALBUMIN 4.5 07/07/2016 1016   AST 17 03/31/2021 1516   ALT 18 03/31/2021 1516   ALKPHOS 69 03/31/2021 1516   BILITOT 0.3 03/31/2021 1516   BILITOT 0.3 07/07/2016 1016   GFRNONAA 61 06/12/2020 0000   GFRAA 74 06/12/2020 0000       Assessment and Plan:   Nichole Delgado is a 73 y.o. y/o female returns for follow-up of:  1.  Irritable bowel syndrome, diarrhea predominant: Symptoms have improved in the past few days  on current treatment. - She can continue Metamucil or try OTC FiberCon tablets 2 tablets twice daily. - Low FODMAP diet handout given and discussed. - Continue nortriptyline nightly as prescribed by  her PCP. - Continue hyoscyamine  0.25 Mg sparingly as needed for cramping. - Continue Lomotil  or Imodium sparingly as needed. - Once she moves to [place]  and establishes with new GI, we will be happy to send her GI records.  Ellouise Console, PA-C  Follow up as needed.

## 2024-06-18 ENCOUNTER — Encounter: Payer: Self-pay | Admitting: Physician Assistant

## 2024-06-18 ENCOUNTER — Ambulatory Visit (INDEPENDENT_AMBULATORY_CARE_PROVIDER_SITE_OTHER): Admitting: Physician Assistant

## 2024-06-18 ENCOUNTER — Telehealth: Payer: Self-pay | Admitting: Primary Care

## 2024-06-18 VITALS — BP 132/86 | HR 99 | Ht <= 58 in | Wt 123.4 lb

## 2024-06-18 DIAGNOSIS — R911 Solitary pulmonary nodule: Secondary | ICD-10-CM

## 2024-06-18 DIAGNOSIS — K580 Irritable bowel syndrome with diarrhea: Secondary | ICD-10-CM | POA: Diagnosis not present

## 2024-06-18 NOTE — Patient Instructions (Addendum)
   Take 2 Caplets with a full glass of water  or other liquid (8 ounces/240 milliliters)  Once Daily.  Increase to 2 Caplets Twice daily if needed.   Please follow up sooner if symptoms increase or worsen  Due to recent changes in healthcare laws, you may see the results of your imaging and laboratory studies on MyChart before your provider has had a chance to review them.  We understand that in some cases there may be results that are confusing or concerning to you. Not all laboratory results come back in the same time frame and the provider may be waiting for multiple results in order to interpret others.  Please give us  48 hours in order for your provider to thoroughly review all the results before contacting the office for clarification of your results.   Thank you for trusting me with your gastrointestinal care!   Ellouise Console, PA-C _______________________________________________________  If your blood pressure at your visit was 140/90 or greater, please contact your primary care physician to follow up on this.  _______________________________________________________  If you are age 10 or older, your body mass index should be between 23-30. Your Body mass index is 25.79 kg/m. If this is out of the aforementioned range listed, please consider follow up with your Primary Care Provider.  If you are age 89 or younger, your body mass index should be between 19-25. Your Body mass index is 25.79 kg/m. If this is out of the aformentioned range listed, please consider follow up with your Primary Care Provider.   ________________________________________________________  The [facility] providers would like to encourage you to use MYCHART to communicate with providers for non-urgent requests or questions.  Due to long hold times on the telephone, sending your provider a message by [facility] may be a faster and more efficient way to get a response.  Please allow 48 business hours for a response.   Please remember that this is for non-urgent requests.  _______________________________________________________

## 2024-06-18 NOTE — Telephone Encounter (Signed)
  Please let patient know I spoke with Dr. Jude regarding her CT findings and he advised continue to monitor, 6 month follow-up CT scan. Biopsy not needed at this time.

## 2024-06-25 DIAGNOSIS — F250 Schizoaffective disorder, bipolar type: Secondary | ICD-10-CM | POA: Diagnosis not present

## 2024-07-03 ENCOUNTER — Telehealth (HOSPITAL_BASED_OUTPATIENT_CLINIC_OR_DEPARTMENT_OTHER): Payer: Self-pay

## 2024-07-03 ENCOUNTER — Ambulatory Visit (INDEPENDENT_AMBULATORY_CARE_PROVIDER_SITE_OTHER): Admitting: Family Medicine

## 2024-07-03 ENCOUNTER — Encounter: Payer: Self-pay | Admitting: Family Medicine

## 2024-07-03 VITALS — BP 112/79 | HR 85 | Temp 97.9°F | Ht <= 58 in | Wt 123.9 lb

## 2024-07-03 DIAGNOSIS — I10 Essential (primary) hypertension: Secondary | ICD-10-CM

## 2024-07-03 DIAGNOSIS — N951 Menopausal and female climacteric states: Secondary | ICD-10-CM

## 2024-07-03 DIAGNOSIS — E785 Hyperlipidemia, unspecified: Secondary | ICD-10-CM | POA: Diagnosis not present

## 2024-07-03 DIAGNOSIS — B351 Tinea unguium: Secondary | ICD-10-CM | POA: Diagnosis not present

## 2024-07-03 MED ORDER — TERBINAFINE HCL 250 MG PO TABS: 250.0000 mg | ORAL_TABLET | Freq: Every day | ORAL | 0 refills | Status: AC

## 2024-07-03 MED ORDER — PRAVASTATIN SODIUM 40 MG PO TABS: 40.0000 mg | ORAL_TABLET | Freq: Every day | ORAL | 3 refills | Status: AC

## 2024-07-03 MED ORDER — CITALOPRAM HYDROBROMIDE 10 MG PO TABS: 10.0000 mg | ORAL_TABLET | Freq: Every day | ORAL | 3 refills | Status: AC

## 2024-07-03 NOTE — Progress Notes (Signed)
   Nichole Delgado is a 73 y.o. female who presents today for an office visit.  Assessment/Plan:  New/Acute Problems: Onychomycosis  No red flags.  This has been an ongoing issue for many years.  She has not responded to topical treatments.  We discussed treatment options.  She is agreeable to terbinafine  for 3 months.  We did discuss potential side effects.  No history of liver issues.  We can recheck c-Met in a few weeks though she will let us  know if she has any issues.  Chronic Problems Addressed Today: Essential hypertension Blood pressure at goal today on losartan  25 mg daily.  Menopausal symptoms Follows with gynecology.  This was previously managed with hormone replacement however she developed breast cysts and is no longer on estrogen.  She is interested in nonhormonal options.  We did discuss potential treatment options.  Will start Celexa  10 mg daily.  We discussed potential side effects.  She will follow-up with us  in a few weeks and we can adjust as needed.  Dyslipidemia Stable on pravastatin  40 mg daily.  Will check lipids with next blood draw.     Subjective:  HPI:  See assessment / plan for status of chronic conditions. She is here today to discuss toenail fungus.  This has been present for years. She has tried using topical treatments to the area without much improvement. Located on her right foot.  Seems to be getting worse.       Objective:  Physical Exam: BP 112/79   Pulse 85   Temp 97.9 F (36.6 C) (Temporal)   Ht 4' 10 (1.473 m)   Wt 123 lb 14.4 oz (56.2 kg)   SpO2 99%   BMI 25.90 kg/m   Gen: No acute distress, resting comfortably CV: Regular rate and rhythm with no murmurs appreciated Pulm: Normal work of breathing, clear to auscultation bilaterally with no crackles, wheezes, or rhonchi MUSCULOSKELETAL: Right foot with onychomycosis. Neuro: Grossly normal, moves all extremities Psych: Normal affect and thought content      Nichole Sappington M. Kennyth,  MD 07/03/2024 1:42 PM

## 2024-07-03 NOTE — Patient Instructions (Signed)
 It was very nice to see you today!  We will start terbinafine  for your toenails.  Try the Celexa  for your hot flashes.  I will refill your pravastatin .  Let me know how things are working in a few weeks.  Return if symptoms worsen or fail to improve.   Take care, Dr Kennyth  PLEASE NOTE:  If you had any lab tests, please let us  know if you have not heard back within a few days. You may see your results on mychart before we have a chance to review them but we will give you a call once they are reviewed by us .   If we ordered any referrals today, please let us  know if you have not heard from their office within the next week.   If you had any urgent prescriptions sent in today, please check with the pharmacy within an hour of our visit to make sure the prescription was transmitted appropriately.   Please try these tips to maintain a healthy lifestyle:  Eat at least 3 REAL meals and 1-2 snacks per day.  Aim for no more than 5 hours between eating.  If you eat breakfast, please do so within one hour of getting up.   Each meal should contain half fruits/vegetables, one quarter protein, and one quarter carbs (no bigger than a computer mouse)  Cut down on sweet beverages. This includes juice, soda, and sweet tea.   Drink at least 1 glass of water  with each meal and aim for at least 8 glasses per day  Exercise at least 150 minutes every week.

## 2024-07-03 NOTE — Assessment & Plan Note (Signed)
 Blood pressure at goal today on losartan  25 mg daily.

## 2024-07-03 NOTE — Telephone Encounter (Signed)
 Copied from CRM [id number]. Topic: Clinical - Prescription Issue >> Jul 02, 2024 10:55 AM Joesph PARAS wrote: Reason for CRM: Patient is calling to request that we send in a prescription for her BREO inhaler for 90 days with three refills, as she wants to have a years supply sent in at a time. Patient requesting this be sent in ASAP.   States requirements are fax cover sheet, prescription with providers signature, 90 day duration and 3 refills.  Fax No [phone] [employer]

## 2024-07-03 NOTE — Assessment & Plan Note (Signed)
 Stable on pravastatin  40 mg daily.  Will check lipids with next blood draw.

## 2024-07-03 NOTE — Assessment & Plan Note (Addendum)
 Follows with gynecology.  This was previously managed with hormone replacement however she developed breast cysts and is no longer on estrogen.  She is interested in nonhormonal options.  We did discuss potential treatment options.  Will start Celexa  10 mg daily.  We discussed potential side effects.  She will follow-up with us  in a few weeks and we can adjust as needed.

## 2024-07-06 MED ORDER — FLUTICASONE FUROATE-VILANTEROL 200-25 MCG/ACT IN AEPB: 1.0000 | INHALATION_SPRAY | Freq: Every day | RESPIRATORY_TRACT | 3 refills | Status: AC

## 2024-07-06 NOTE — Telephone Encounter (Signed)
 RX printed, signed, and faxed. NFN

## 2024-07-08 ENCOUNTER — Other Ambulatory Visit: Payer: Self-pay | Admitting: Gastroenterology

## 2024-07-10 DIAGNOSIS — Z79891 Long term (current) use of opiate analgesic: Secondary | ICD-10-CM | POA: Diagnosis not present

## 2024-07-10 DIAGNOSIS — G9050 Complex regional pain syndrome I, unspecified: Secondary | ICD-10-CM | POA: Diagnosis not present

## 2024-07-10 DIAGNOSIS — G8929 Other chronic pain: Secondary | ICD-10-CM | POA: Diagnosis not present

## 2024-07-10 DIAGNOSIS — M79671 Pain in right foot: Secondary | ICD-10-CM | POA: Diagnosis not present

## 2024-07-12 DIAGNOSIS — L718 Other rosacea: Secondary | ICD-10-CM | POA: Diagnosis not present

## 2024-09-12 DIAGNOSIS — Z720 Tobacco use: Secondary | ICD-10-CM | POA: Diagnosis not present

## 2024-09-12 DIAGNOSIS — G4700 Insomnia, unspecified: Secondary | ICD-10-CM | POA: Diagnosis not present

## 2024-09-12 DIAGNOSIS — K589 Irritable bowel syndrome without diarrhea: Secondary | ICD-10-CM | POA: Diagnosis not present

## 2024-09-12 DIAGNOSIS — Z1339 Encounter for screening examination for other mental health and behavioral disorders: Secondary | ICD-10-CM | POA: Diagnosis not present

## 2024-09-12 DIAGNOSIS — R911 Solitary pulmonary nodule: Secondary | ICD-10-CM | POA: Diagnosis not present

## 2024-09-12 DIAGNOSIS — R7303 Prediabetes: Secondary | ICD-10-CM | POA: Diagnosis not present

## 2024-09-12 DIAGNOSIS — I10 Essential (primary) hypertension: Secondary | ICD-10-CM | POA: Diagnosis not present

## 2024-09-12 DIAGNOSIS — J449 Chronic obstructive pulmonary disease, unspecified: Secondary | ICD-10-CM | POA: Diagnosis not present

## 2024-09-12 DIAGNOSIS — Z1389 Encounter for screening for other disorder: Secondary | ICD-10-CM | POA: Diagnosis not present

## 2024-09-12 DIAGNOSIS — G5772 Causalgia of left lower limb: Secondary | ICD-10-CM | POA: Diagnosis not present

## 2024-09-12 DIAGNOSIS — E785 Hyperlipidemia, unspecified: Secondary | ICD-10-CM | POA: Diagnosis not present

## 2024-09-12 DIAGNOSIS — Z1331 Encounter for screening for depression: Secondary | ICD-10-CM | POA: Diagnosis not present

## 2024-09-28 DIAGNOSIS — Z79899 Other long term (current) drug therapy: Secondary | ICD-10-CM | POA: Diagnosis not present

## 2024-09-28 DIAGNOSIS — G90521 Complex regional pain syndrome I of right lower limb: Secondary | ICD-10-CM | POA: Diagnosis not present

## 2024-09-28 DIAGNOSIS — M961 Postlaminectomy syndrome, not elsewhere classified: Secondary | ICD-10-CM | POA: Diagnosis not present

## 2024-09-28 DIAGNOSIS — M461 Sacroiliitis, not elsewhere classified: Secondary | ICD-10-CM | POA: Diagnosis not present

## 2024-09-28 DIAGNOSIS — M5416 Radiculopathy, lumbar region: Secondary | ICD-10-CM | POA: Diagnosis not present

## 2024-10-01 DIAGNOSIS — S93141A Subluxation of metatarsophalangeal joint of right great toe, initial encounter: Secondary | ICD-10-CM | POA: Diagnosis not present

## 2024-10-01 DIAGNOSIS — M19172 Post-traumatic osteoarthritis, left ankle and foot: Secondary | ICD-10-CM | POA: Diagnosis not present

## 2024-10-01 DIAGNOSIS — M25571 Pain in right ankle and joints of right foot: Secondary | ICD-10-CM | POA: Diagnosis not present

## 2024-10-01 DIAGNOSIS — G90523 Complex regional pain syndrome I of lower limb, bilateral: Secondary | ICD-10-CM | POA: Diagnosis not present

## 2024-10-01 DIAGNOSIS — T8484XA Pain due to internal orthopedic prosthetic devices, implants and grafts, initial encounter: Secondary | ICD-10-CM | POA: Diagnosis not present

## 2024-10-01 DIAGNOSIS — M25572 Pain in left ankle and joints of left foot: Secondary | ICD-10-CM | POA: Diagnosis not present

## 2024-10-01 DIAGNOSIS — M19171 Post-traumatic osteoarthritis, right ankle and foot: Secondary | ICD-10-CM | POA: Diagnosis not present

## 2024-10-22 ENCOUNTER — Telehealth: Payer: Self-pay | Admitting: Primary Care

## 2024-10-22 NOTE — Telephone Encounter (Signed)
 Cancelled appt and order. NFN

## 2024-10-22 NOTE — Telephone Encounter (Signed)
 Copied from CRM #[id number]. Topic: General - Other >> Oct 19, 2024  3:34 PM Rilla B wrote: Reason for CRM: Patient calling in to state she has relocated to [place], [place]...  Wants to Dr Jude and FORBES Ferrari to know how much she appreciated the care they gave her. She has appts in [place] with a new pulm. I have canceled future appt on 11/21. Please cancel CT scheduled on 11/06. (Per [facility], they cannot cancel.) Questions please call patient @ [phone]      [facility] Please cancel CT

## 2024-10-23 DIAGNOSIS — G5772 Causalgia of left lower limb: Secondary | ICD-10-CM | POA: Diagnosis not present

## 2024-10-23 DIAGNOSIS — R7303 Prediabetes: Secondary | ICD-10-CM | POA: Diagnosis not present

## 2024-10-23 DIAGNOSIS — J209 Acute bronchitis, unspecified: Secondary | ICD-10-CM | POA: Diagnosis not present

## 2024-10-23 DIAGNOSIS — E782 Mixed hyperlipidemia: Secondary | ICD-10-CM | POA: Diagnosis not present

## 2024-10-23 DIAGNOSIS — R051 Acute cough: Secondary | ICD-10-CM | POA: Diagnosis not present

## 2024-10-25 ENCOUNTER — Other Ambulatory Visit

## 2024-10-29 DIAGNOSIS — M961 Postlaminectomy syndrome, not elsewhere classified: Secondary | ICD-10-CM | POA: Diagnosis not present

## 2024-10-29 DIAGNOSIS — G894 Chronic pain syndrome: Secondary | ICD-10-CM | POA: Diagnosis not present

## 2024-10-29 DIAGNOSIS — M5416 Radiculopathy, lumbar region: Secondary | ICD-10-CM | POA: Diagnosis not present

## 2024-10-29 DIAGNOSIS — Z79891 Long term (current) use of opiate analgesic: Secondary | ICD-10-CM | POA: Diagnosis not present

## 2024-10-29 DIAGNOSIS — M461 Sacroiliitis, not elsewhere classified: Secondary | ICD-10-CM | POA: Diagnosis not present

## 2024-10-29 DIAGNOSIS — G90521 Complex regional pain syndrome I of right lower limb: Secondary | ICD-10-CM | POA: Diagnosis not present

## 2024-10-29 DIAGNOSIS — Z5181 Encounter for therapeutic drug level monitoring: Secondary | ICD-10-CM | POA: Diagnosis not present

## 2024-10-31 DIAGNOSIS — M25571 Pain in right ankle and joints of right foot: Secondary | ICD-10-CM | POA: Diagnosis not present

## 2024-10-31 DIAGNOSIS — Z780 Asymptomatic menopausal state: Secondary | ICD-10-CM | POA: Diagnosis not present

## 2024-10-31 DIAGNOSIS — Z1389 Encounter for screening for other disorder: Secondary | ICD-10-CM | POA: Diagnosis not present

## 2024-10-31 DIAGNOSIS — T8484XA Pain due to internal orthopedic prosthetic devices, implants and grafts, initial encounter: Secondary | ICD-10-CM | POA: Diagnosis not present

## 2024-10-31 DIAGNOSIS — G90523 Complex regional pain syndrome I of lower limb, bilateral: Secondary | ICD-10-CM | POA: Diagnosis not present

## 2024-10-31 DIAGNOSIS — M25572 Pain in left ankle and joints of left foot: Secondary | ICD-10-CM | POA: Diagnosis not present

## 2024-10-31 DIAGNOSIS — M19171 Post-traumatic osteoarthritis, right ankle and foot: Secondary | ICD-10-CM | POA: Diagnosis not present

## 2024-10-31 DIAGNOSIS — M19172 Post-traumatic osteoarthritis, left ankle and foot: Secondary | ICD-10-CM | POA: Diagnosis not present

## 2024-10-31 DIAGNOSIS — S93141A Subluxation of metatarsophalangeal joint of right great toe, initial encounter: Secondary | ICD-10-CM | POA: Diagnosis not present

## 2024-11-06 DIAGNOSIS — R911 Solitary pulmonary nodule: Secondary | ICD-10-CM | POA: Diagnosis not present

## 2024-11-06 DIAGNOSIS — J449 Chronic obstructive pulmonary disease, unspecified: Secondary | ICD-10-CM | POA: Diagnosis not present

## 2024-11-06 DIAGNOSIS — F17200 Nicotine dependence, unspecified, uncomplicated: Secondary | ICD-10-CM | POA: Diagnosis not present

## 2024-11-09 ENCOUNTER — Ambulatory Visit: Admitting: Primary Care

## 2024-11-13 DIAGNOSIS — M1712 Unilateral primary osteoarthritis, left knee: Secondary | ICD-10-CM | POA: Diagnosis not present

## 2024-11-13 DIAGNOSIS — M25562 Pain in left knee: Secondary | ICD-10-CM | POA: Diagnosis not present
